# Patient Record
Sex: Male | Born: 1945
Health system: Southern US, Community
[De-identification: ages and names within clinical notes are randomized; demographics above are authoritative.]

## PROBLEM LIST (undated history)

## (undated) DIAGNOSIS — Z789 Other specified health status: Secondary | ICD-10-CM

## (undated) DIAGNOSIS — D649 Anemia, unspecified: Secondary | ICD-10-CM

## (undated) DIAGNOSIS — C801 Malignant (primary) neoplasm, unspecified: Secondary | ICD-10-CM

## (undated) DIAGNOSIS — Z433 Encounter for attention to colostomy: Secondary | ICD-10-CM

## (undated) DIAGNOSIS — K219 Gastro-esophageal reflux disease without esophagitis: Secondary | ICD-10-CM

## (undated) HISTORY — PX: GANGLION CYST EXCISION: SHX1691

---

## 2011-09-25 ENCOUNTER — Other Ambulatory Visit: Payer: Self-pay

## 2011-09-25 ENCOUNTER — Encounter (HOSPITAL_COMMUNITY): Admission: EM | Disposition: A | Discharge status: Home Health | Payer: Self-pay | Source: Home / Self Care

## 2011-09-25 ENCOUNTER — Encounter (HOSPITAL_COMMUNITY): Payer: Self-pay | Admitting: Anesthesiology

## 2011-09-25 ENCOUNTER — Other Ambulatory Visit (INDEPENDENT_AMBULATORY_CARE_PROVIDER_SITE_OTHER): Payer: Self-pay | Admitting: Surgery

## 2011-09-25 ENCOUNTER — Encounter: Payer: Self-pay | Admitting: *Deleted

## 2011-09-25 ENCOUNTER — Emergency Department (HOSPITAL_COMMUNITY): Payer: Medicare Other | Admitting: Anesthesiology

## 2011-09-25 ENCOUNTER — Inpatient Hospital Stay (HOSPITAL_COMMUNITY)
Admission: EM | Admit: 2011-09-25 | Discharge: 2011-10-02 | DRG: 330 | Disposition: A | Discharge status: Home Health | Payer: Medicare Other | Attending: Surgery | Admitting: Surgery

## 2011-09-25 DIAGNOSIS — C189 Malignant neoplasm of colon, unspecified: Secondary | ICD-10-CM

## 2011-09-25 DIAGNOSIS — C772 Secondary and unspecified malignant neoplasm of intra-abdominal lymph nodes: Secondary | ICD-10-CM | POA: Diagnosis present

## 2011-09-25 DIAGNOSIS — F17200 Nicotine dependence, unspecified, uncomplicated: Secondary | ICD-10-CM | POA: Diagnosis present

## 2011-09-25 DIAGNOSIS — C185 Malignant neoplasm of splenic flexure: Principal | ICD-10-CM | POA: Diagnosis present

## 2011-09-25 DIAGNOSIS — R030 Elevated blood-pressure reading, without diagnosis of hypertension: Secondary | ICD-10-CM | POA: Diagnosis not present

## 2011-09-25 DIAGNOSIS — K560 Paralytic ileus: Secondary | ICD-10-CM | POA: Diagnosis not present

## 2011-09-25 DIAGNOSIS — N179 Acute kidney failure, unspecified: Secondary | ICD-10-CM | POA: Diagnosis not present

## 2011-09-25 DIAGNOSIS — K219 Gastro-esophageal reflux disease without esophagitis: Secondary | ICD-10-CM | POA: Diagnosis present

## 2011-09-25 DIAGNOSIS — K5669 Other intestinal obstruction: Secondary | ICD-10-CM | POA: Diagnosis present

## 2011-09-25 DIAGNOSIS — K56609 Unspecified intestinal obstruction, unspecified as to partial versus complete obstruction: Secondary | ICD-10-CM

## 2011-09-25 HISTORY — PX: PARTIAL COLECTOMY: SHX5273

## 2011-09-25 HISTORY — PX: COLOSTOMY: SHX63

## 2011-09-25 HISTORY — DX: Gastro-esophageal reflux disease without esophagitis: K21.9

## 2011-09-25 LAB — COMPREHENSIVE METABOLIC PANEL
ALT: 9 U/L (ref 0–53)
BUN: 24 mg/dL — ABNORMAL HIGH (ref 6–23)
CO2: 26 mEq/L (ref 19–32)
Calcium: 9.2 mg/dL (ref 8.4–10.5)
Creatinine, Ser: 1.05 mg/dL (ref 0.50–1.35)
GFR calc Af Amer: 83 mL/min — ABNORMAL LOW (ref >90)
GFR calc non Af Amer: 71 mL/min — ABNORMAL LOW (ref >90)
Glucose, Bld: 109 mg/dL — ABNORMAL HIGH (ref 70–99)
Sodium: 134 mEq/L — ABNORMAL LOW (ref 135–145)

## 2011-09-25 LAB — DIFFERENTIAL
Eosinophils Relative: 0 % (ref 0–5)
Lymphocytes Relative: 29 % (ref 12–46)
Lymphs Abs: 1.5 10*3/uL (ref 0.7–4.0)
Monocytes Absolute: 1.1 10*3/uL — ABNORMAL HIGH (ref 0.1–1.0)
Monocytes Relative: 20 % — ABNORMAL HIGH (ref 3–12)

## 2011-09-25 LAB — CBC
HCT: 48.5 % (ref 39.0–52.0)
MCH: 29.3 pg (ref 26.0–34.0)
MCV: 84.5 fL (ref 78.0–100.0)
RBC: 5.74 MIL/uL (ref 4.22–5.81)
WBC: 5.2 10*3/uL (ref 4.0–10.5)

## 2011-09-25 SURGERY — COLECTOMY, PARTIAL
Anesthesia: General | Site: Abdomen | Wound class: Clean Contaminated

## 2011-09-25 MED ORDER — ONDANSETRON HCL 4 MG/2ML IJ SOLN
4.0000 mg | Freq: Four times a day (QID) | INTRAMUSCULAR | Status: DC | PRN
  Administered 2011-09-30: 4 mg via INTRAVENOUS
  Filled 2011-09-25: qty 2

## 2011-09-25 MED ORDER — SODIUM CHLORIDE 0.9 % IV SOLN
INTRAVENOUS | Status: DC
  Administered 2011-09-25: 03:00:00 via INTRAVENOUS

## 2011-09-25 MED ORDER — HEPARIN SODIUM (PORCINE) 5000 UNIT/ML IJ SOLN
5000.0000 [IU] | Freq: Three times a day (TID) | INTRAMUSCULAR | Status: DC
  Administered 2011-09-25 – 2011-10-02 (×20): 5000 [IU] via SUBCUTANEOUS
  Filled 2011-09-25 (×27): qty 1

## 2011-09-25 MED ORDER — PHENOL 1.4 % MT LIQD
1.0000 | OROMUCOSAL | Status: DC | PRN
  Administered 2011-09-25: 1 via OROMUCOSAL
  Filled 2011-09-25: qty 177

## 2011-09-25 MED ORDER — HYDROMORPHONE HCL PF 1 MG/ML IJ SOLN: 0.2500 mg | INTRAMUSCULAR | Status: DC | PRN

## 2011-09-25 MED ORDER — DIPHENHYDRAMINE HCL 12.5 MG/5ML PO ELIX: 12.5000 mg | ORAL_SOLUTION | Freq: Four times a day (QID) | ORAL | Status: DC | PRN

## 2011-09-25 MED ORDER — SODIUM CHLORIDE 0.9 % IJ SOLN: 9.0000 mL | INTRAMUSCULAR | Status: DC | PRN

## 2011-09-25 MED ORDER — DEXTROSE 5 % IV SOLN
1.0000 g | INTRAVENOUS | Status: DC | PRN
  Administered 2011-09-25: 2 g via INTRAVENOUS

## 2011-09-25 MED ORDER — ROCURONIUM BROMIDE 100 MG/10ML IV SOLN
INTRAVENOUS | Status: DC | PRN
  Administered 2011-09-25: 40 mg via INTRAVENOUS

## 2011-09-25 MED ORDER — MORPHINE SULFATE 2 MG/ML IJ SOLN: 1.0000 mg | INTRAMUSCULAR | Status: DC | PRN

## 2011-09-25 MED ORDER — KCL IN DEXTROSE-NACL 20-5-0.45 MEQ/L-%-% IV SOLN
INTRAVENOUS | Status: DC
  Administered 2011-09-25: 15:00:00 via INTRAVENOUS
  Administered 2011-09-26: 150 mL/h via INTRAVENOUS
  Administered 2011-09-26 – 2011-09-27 (×4): via INTRAVENOUS
  Administered 2011-09-27: 150 mL/h via INTRAVENOUS
  Administered 2011-09-28 – 2011-10-02 (×7): via INTRAVENOUS
  Filled 2011-09-25 (×24): qty 1000

## 2011-09-25 MED ORDER — SUCCINYLCHOLINE CHLORIDE 20 MG/ML IJ SOLN
INTRAMUSCULAR | Status: DC | PRN
  Administered 2011-09-25: 100 mg via INTRAVENOUS

## 2011-09-25 MED ORDER — VITAMINS A & D EX OINT
TOPICAL_OINTMENT | CUTANEOUS | Status: AC
  Administered 2011-09-25: 14:00:00
  Filled 2011-09-25: qty 5

## 2011-09-25 MED ORDER — NALOXONE HCL 0.4 MG/ML IJ SOLN: 0.4000 mg | INTRAMUSCULAR | Status: DC | PRN

## 2011-09-25 MED ORDER — MORPHINE SULFATE (PF) 1 MG/ML IV SOLN
INTRAVENOUS | Status: DC
  Administered 2011-09-25: 13:00:00 via INTRAVENOUS
  Filled 2011-09-25: qty 25

## 2011-09-25 MED ORDER — KCL IN DEXTROSE-NACL 20-5-0.45 MEQ/L-%-% IV SOLN
INTRAVENOUS | Status: AC
  Filled 2011-09-25: qty 1000

## 2011-09-25 MED ORDER — CEFOXITIN SODIUM-DEXTROSE 1-4 GM-% IV SOLR (PREMIX)
INTRAVENOUS | Status: AC
  Filled 2011-09-25: qty 100

## 2011-09-25 MED ORDER — NEOSTIGMINE METHYLSULFATE 1 MG/ML IJ SOLN
INTRAMUSCULAR | Status: DC | PRN
  Administered 2011-09-25: 5 mg via INTRAVENOUS

## 2011-09-25 MED ORDER — EPHEDRINE SULFATE 50 MG/ML IJ SOLN
INTRAMUSCULAR | Status: DC | PRN
  Administered 2011-09-25: 5 mg via INTRAVENOUS

## 2011-09-25 MED ORDER — HYDROMORPHONE HCL PF 1 MG/ML IJ SOLN
INTRAMUSCULAR | Status: DC | PRN
  Administered 2011-09-25: 0.5 mg via INTRAVENOUS

## 2011-09-25 MED ORDER — PROPOFOL 10 MG/ML IV EMUL
INTRAVENOUS | Status: DC | PRN
  Administered 2011-09-25: 180 mg via INTRAVENOUS

## 2011-09-25 MED ORDER — DIPHENHYDRAMINE HCL 50 MG/ML IJ SOLN: 12.5000 mg | Freq: Four times a day (QID) | INTRAMUSCULAR | Status: DC | PRN

## 2011-09-25 MED ORDER — LACTATED RINGERS IV SOLN
INTRAVENOUS | Status: DC | PRN
  Administered 2011-09-25: 10:00:00 via INTRAVENOUS

## 2011-09-25 MED ORDER — FENTANYL CITRATE 0.05 MG/ML IJ SOLN
25.0000 ug | Freq: Once | INTRAMUSCULAR | Status: AC
  Administered 2011-09-25: 25 ug via INTRAVENOUS
  Filled 2011-09-25: qty 2

## 2011-09-25 MED ORDER — KCL IN DEXTROSE-NACL 20-5-0.45 MEQ/L-%-% IV SOLN: INTRAVENOUS | Status: DC

## 2011-09-25 MED ORDER — HYDRALAZINE HCL 20 MG/ML IJ SOLN
INTRAMUSCULAR | Status: DC | PRN
  Administered 2011-09-25: 5 mg via INTRAVENOUS

## 2011-09-25 MED ORDER — LIDOCAINE HCL (CARDIAC) 20 MG/ML IV SOLN
INTRAVENOUS | Status: DC | PRN
  Administered 2011-09-25: 50 mg via INTRAVENOUS

## 2011-09-25 MED ORDER — ONDANSETRON HCL 4 MG/2ML IJ SOLN
INTRAMUSCULAR | Status: DC | PRN
  Administered 2011-09-25: 4 mg via INTRAVENOUS

## 2011-09-25 MED ORDER — MORPHINE SULFATE (PF) 1 MG/ML IV SOLN
INTRAVENOUS | Status: DC
  Administered 2011-09-25: 18 mg via INTRAVENOUS
  Administered 2011-09-25: 3 mg via INTRAVENOUS
  Administered 2011-09-26 (×2): 1.5 mg via INTRAVENOUS
  Administered 2011-09-26: via INTRAVENOUS
  Administered 2011-09-26: 21 mg via INTRAVENOUS
  Administered 2011-09-26: 14 mg via INTRAVENOUS
  Administered 2011-09-26: 4.5 mg via INTRAVENOUS
  Administered 2011-09-27 (×2): via INTRAVENOUS
  Administered 2011-09-28: 1.5 mg via INTRAVENOUS
  Administered 2011-09-28: 6 mg via INTRAVENOUS
  Administered 2011-09-28: 3 mg via INTRAVENOUS
  Administered 2011-09-29: 10:00:00 via INTRAVENOUS
  Filled 2011-09-25 (×6): qty 25

## 2011-09-25 MED ORDER — LIDOCAINE HCL 2 % EX GEL
Freq: Once | CUTANEOUS | Status: AC
  Administered 2011-09-25: 10 via TOPICAL
  Filled 2011-09-25: qty 10

## 2011-09-25 MED ORDER — PROMETHAZINE HCL 25 MG/ML IJ SOLN
6.2500 mg | INTRAMUSCULAR | Status: DC | PRN
  Filled 2011-09-25: qty 1

## 2011-09-25 MED ORDER — ONDANSETRON HCL 4 MG/2ML IJ SOLN: 4.0000 mg | Freq: Four times a day (QID) | INTRAMUSCULAR | Status: DC | PRN

## 2011-09-25 MED ORDER — SODIUM CHLORIDE 0.9 % IR SOLN
Status: DC | PRN
  Administered 2011-09-25: 2000 mL

## 2011-09-25 MED ORDER — CEFOXITIN SODIUM 2 G IV SOLR
2.0000 g | Freq: Once | INTRAVENOUS | Status: DC
  Filled 2011-09-25: qty 2

## 2011-09-25 MED ORDER — MIDAZOLAM HCL 5 MG/5ML IJ SOLN
INTRAMUSCULAR | Status: DC | PRN
  Administered 2011-09-25: 2 mg via INTRAVENOUS

## 2011-09-25 MED ORDER — DEXTROSE 5 % IV SOLN
1.0000 g | Freq: Four times a day (QID) | INTRAVENOUS | Status: AC
  Administered 2011-09-25 – 2011-09-26 (×3): 1 g via INTRAVENOUS
  Filled 2011-09-25 (×5): qty 1

## 2011-09-25 MED ORDER — GLYCOPYRROLATE 0.2 MG/ML IJ SOLN
INTRAMUSCULAR | Status: DC | PRN
  Administered 2011-09-25: .8 mg via INTRAVENOUS

## 2011-09-25 MED ORDER — FENTANYL CITRATE 0.05 MG/ML IJ SOLN
INTRAMUSCULAR | Status: DC | PRN
  Administered 2011-09-25 (×3): 50 ug via INTRAVENOUS
  Administered 2011-09-25: 100 ug via INTRAVENOUS
  Administered 2011-09-25 (×2): 50 ug via INTRAVENOUS

## 2011-09-25 MED ORDER — ONDANSETRON HCL 4 MG PO TABS: 4.0000 mg | ORAL_TABLET | Freq: Four times a day (QID) | ORAL | Status: DC | PRN

## 2011-09-25 MED ORDER — LABETALOL HCL 5 MG/ML IV SOLN
INTRAVENOUS | Status: DC | PRN
  Administered 2011-09-25 (×2): 5 mg via INTRAVENOUS

## 2011-09-25 MED ORDER — ONDANSETRON HCL 4 MG/2ML IJ SOLN
4.0000 mg | Freq: Once | INTRAMUSCULAR | Status: AC
  Administered 2011-09-25: 4 mg via INTRAVENOUS
  Filled 2011-09-25: qty 2

## 2011-09-25 SURGICAL SUPPLY — 56 items
APPLICATOR COTTON TIP 6IN STRL (MISCELLANEOUS) IMPLANT
BLADE EXTENDED COATED 6.5IN (ELECTRODE) ×3 IMPLANT
BLADE HEX COATED 2.75 (ELECTRODE) ×3 IMPLANT
BLADE SURG SZ10 CARB STEEL (BLADE) ×3 IMPLANT
CANISTER SUCTION 2500CC (MISCELLANEOUS) ×3 IMPLANT
CLIP TI LARGE 6 (CLIP) IMPLANT
CLOTH BEACON ORANGE TIMEOUT ST (SAFETY) ×3 IMPLANT
COVER MAYO STAND STRL (DRAPES) ×3 IMPLANT
COVER SURGICAL LIGHT HANDLE (MISCELLANEOUS) ×6 IMPLANT
DRAPE LAPAROSCOPIC ABDOMINAL (DRAPES) ×3 IMPLANT
DRAPE LG THREE QUARTER DISP (DRAPES) IMPLANT
DRAPE WARM FLUID 44X44 (DRAPE) ×3 IMPLANT
DRESSING TELFA 8X3 (GAUZE/BANDAGES/DRESSINGS) ×3 IMPLANT
DRSG PAD ABDOMINAL 8X10 ST (GAUZE/BANDAGES/DRESSINGS) ×3 IMPLANT
ELECT REM PT RETURN 9FT ADLT (ELECTROSURGICAL) ×3
ELECTRODE REM PT RTRN 9FT ADLT (ELECTROSURGICAL) ×2 IMPLANT
GLOVE BIOGEL PI IND STRL 7.0 (GLOVE) ×2 IMPLANT
GLOVE BIOGEL PI INDICATOR 7.0 (GLOVE) ×1
GLOVE EUDERMIC 7 POWDERFREE (GLOVE) ×6 IMPLANT
GLOVE SURG SIGNA 7.5 PF LTX (GLOVE) ×3 IMPLANT
GOWN STRL NON-REIN LRG LVL3 (GOWN DISPOSABLE) ×3 IMPLANT
GOWN STRL REIN XL XLG (GOWN DISPOSABLE) ×6 IMPLANT
HAND ACTIVATED (MISCELLANEOUS) IMPLANT
KIT BASIN OR (CUSTOM PROCEDURE TRAY) ×3 IMPLANT
LEGGING LITHOTOMY PAIR STRL (DRAPES) IMPLANT
LIGASURE IMPACT 36 18CM CVD LR (INSTRUMENTS) ×3 IMPLANT
NS IRRIG 1000ML POUR BTL (IV SOLUTION) ×6 IMPLANT
PACK GENERAL/GYN (CUSTOM PROCEDURE TRAY) ×3 IMPLANT
SPONGE GAUZE 4X4 12PLY (GAUZE/BANDAGES/DRESSINGS) ×3 IMPLANT
SPONGE LAP 18X18 X RAY DECT (DISPOSABLE) ×9 IMPLANT
STAPLER VISISTAT 35W (STAPLE) ×3 IMPLANT
SUCTION POOLE TIP (SUCTIONS) ×3 IMPLANT
SUT NOV 1 T60/GS (SUTURE) IMPLANT
SUT NOVA NAB DX-16 0-1 5-0 T12 (SUTURE) IMPLANT
SUT NOVA T20/GS 25 (SUTURE) IMPLANT
SUT PDS AB 1 CTX 36 (SUTURE) ×6 IMPLANT
SUT PDS AB 1 TP1 54 (SUTURE) IMPLANT
SUT PDS AB 1 TP1 96 (SUTURE) ×6 IMPLANT
SUT PROLENE 2 0 KS (SUTURE) IMPLANT
SUT PROLENE 2 0 SH DA (SUTURE) ×3 IMPLANT
SUT SILK 2 0 (SUTURE) ×1
SUT SILK 2 0 SH CR/8 (SUTURE) ×3 IMPLANT
SUT SILK 2 0SH CR/8 30 (SUTURE) IMPLANT
SUT SILK 2-0 18XBRD TIE 12 (SUTURE) ×2 IMPLANT
SUT SILK 2-0 30XBRD TIE 12 (SUTURE) IMPLANT
SUT SILK 3 0 (SUTURE) ×1
SUT SILK 3 0 SH CR/8 (SUTURE) ×3 IMPLANT
SUT SILK 3-0 18XBRD TIE 12 (SUTURE) ×2 IMPLANT
SUT VIC AB 3-0 SH 18 (SUTURE) ×6 IMPLANT
SUT VICRYL 2 0 18  UND BR (SUTURE)
SUT VICRYL 2 0 18 UND BR (SUTURE) IMPLANT
TAPE CLOTH SURG 4X10 WHT LF (GAUZE/BANDAGES/DRESSINGS) ×3 IMPLANT
TOWEL OR 17X26 10 PK STRL BLUE (TOWEL DISPOSABLE) ×6 IMPLANT
TRAY FOLEY CATH 14FRSI W/METER (CATHETERS) ×3 IMPLANT
WATER STERILE IRR 1500ML POUR (IV SOLUTION) IMPLANT
YANKAUER SUCT BULB TIP NO VENT (SUCTIONS) ×3 IMPLANT

## 2011-09-25 NOTE — Transfer of Care (Signed)
Immediate Anesthesia Transfer of Care Note  Patient: Aaron Roth  Procedure(s) Performed:  COLOSTOMY; PARTIAL COLECTOMY  Patient Location: PACU  Anesthesia Type: General  Level of Consciousness: awake, sedated and patient cooperative  Airway & Oxygen Therapy: Patient Spontanous Breathing and Patient connected to face mask oxygen  Post-op Assessment: Report given to PACU RN  Post vital signs: stable  Complications: No apparent anesthesia complications

## 2011-09-25 NOTE — Op Note (Signed)
NAME:  Aaron Roth, Aaron Roth NO.:  [id number]  MEDICAL RECORD NO.:  [id number]  LOCATION:  WOTF                         FACILITY:  [facility]  PHYSICIAN:  Sandria Bales. Ezzard Standing, M.D.  DATE OF BIRTH:  January 29, 1946  DATE OF PROCEDURE:  09/25/2011                               OPERATIVE REPORT   DATE OF SURGERY:  September 25, 2011.  PREOPERATIVE DIAGNOSIS:  Obstruction of colon at splenic flexure.  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  Final pathology pending.  PROCEDURE:  Colonic resection of left transverse colon and left colon with end left transverse colon colostomy and Hartmann's pouch (at prox sigmoid colon)  SURGEON:  Sandria Bales. Ezzard Standing, M.D.  FIRST ASSISTANT:  Ardeth Sportsman, MD  ANESTHESIA:  General endotracheal.  ESTIMATED BLOOD LOSS:  150 cc.  DRAINS:  Left in were none.  He does have a end transverse colon colostomy.  INDICATIONS FOR THE PROCEDURE:  Aaron Roth is a 65 year old African American male who is from [facility], sees Dr. Jeanmarie Plant as his primary medical doctor.  He has had a 4-day history of increasing abdominal distention and pain, went to [facility] last evening, he was evaluated there.  A CT scan suggested colonic obstruction.  They thought that he could not be managed there and he came to the [facility] Emergency Room where he was evaluated.  I have met the patient, his wife and his son and discussed with them my findings.  I think he has an obstructing mass (probable cancer) at the splenic flexure of his colon.  I discussed with him the indications and potential complications of surgery.  The potential complications of surgery include, but are not limited to, bleeding, infection, the possibility this is a cancer that would need further treatment, and long-term plans.  I discussed that he will probably need a colostomy.  OPERATIVE NOTE:  The patient was placed in a supine position, had a Foley  catheter in place, NG tube in place.  His abdomen was prepped with ChloraPrep and sterilely draped.    A time-out was held and a surgical checklist run.    Dr. Lestine Box was the supervising anesthesiologist and the patient was in room 1 at [facility].  I made a long midline abdominal incision with sharp dissection carried down to the abdominal cavity.  He had significant distention of his small bowel.  Abdominal exploration revealed right and left lobes of the liver unremarkable.  There was no evidence of metastatic disease.  His gallbladder was distended, but otherwise unremarkable.  He had an NG tube in his stomach.  The stomach was otherwise unremarkable.  I ran the small bowel entirely.  There was no mass or lesion.  The colon, particularly his cecum was significantly distended from this obstruction.  He had a palpable mass near the splenic flexure of his colon.  There was no evidence of any gross tumor in any other part of his body.  I spent about 30 minutes milking contents from the small bowel back to the NG tube.  We were able to milk about 1300 cc of succus entericus into the stomach and thus  into the NGT.  Then, I turned my attention to the tumor.  I mobilized the left colon going along the lateral peritoneal folds, moving this to the midline.  I identified the spleen, took down the splenic flexure, got into the lesser sac, and mobilized the transverse colon down from dividing the gastrocolic ligament.  I used primarily the LigaSure to do this division.  I did ligate some vessels with 2-0 silk suture.  I got the left colon mobilized, the transverse colon mobilized, I then divided the distal left colon at the junction of the sigmoid colon with a 75- mm Ethicon GIA stapler.  I marked the sigmoid colon stump with two 2-0 Prolene sutures.  I then divided the proximal colon with a 75-mm GIA stapler and resected the colon.  I tried to get the colonic mesentery down to the base  of the mesentery, identified the pancreas, which I tried to spare, and then took off the left colon.  I put a long suture in the proximal segment,  This was sent to pathology.  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  I then irrigated the abdomen with 2 L of saline.  As I mentioned before, I had already marked his distal colonic stump.  I made about a silver dollar size excision of skin in the left upper quadrant.  This is where I brought out his colostomy.  I brought the transverse colon, which came over easily to the left upper quadrant.  I used 2-0 Vicryl sutures to tack this to the anterior abdominal wall and then brought the colon out after I closed the abdomen.  I then tried to lay the omentum underneath the midline incision.  I closed the abdomen with two running double- stranded PDS sutures with interrupted #1 PDS sutures about every fifth row.  I irrigated the wound with saline.  I closed the skin with staples, placed Telfa wicks in the wound.  I then matured the colostomy.  I had about a 2-1/4 inch ostomy that protruded.  I was able to put my finger easily through the colostomy into the peritoneal cavity and then placed a colostomy bag.  The wound was then sterilely dressed.  The patient tolerated the procedure well.  I think he is a little dry coming into the operation.  I gave him about 3 L of fluid during the surgery.  We will plan to put him in the step-down ICU postop for observation.   Sandria Bales. Ezzard Standing, M.D., FACS   DHN/MEDQ  D:  09/25/2011  T:  09/25/2011  Job:  [id number]  cc:   Jeanmarie Plant, MD Fax: [phone]

## 2011-09-25 NOTE — ED Notes (Signed)
Ezzard Standing, MD at bedside.

## 2011-09-25 NOTE — ED Notes (Signed)
Pt transported to OR

## 2011-09-25 NOTE — ED Notes (Signed)
Pt sent over by PV from [facility] with paperwork indicating that he would be transferred. Pt with IV in right AC started PTA. Pt went to [facility] due to abd pain since Sunday, sent here due to possible intestinal blockage and a mass in his abdomen.

## 2011-09-25 NOTE — Anesthesia Preprocedure Evaluation (Addendum)
Anesthesia Evaluation  Patient identified by MRN, date of birth, ID band Patient awake    Reviewed: Allergy & Precautions, H&P , NPO status , Patient's Chart, lab work & pertinent test results, reviewed documented beta blocker date and time   Airway Mallampati: II TM Distance: >3 FB Neck ROM: Full    Dental  (+) Partial Upper   Pulmonary neg pulmonary ROS,    + decreased breath sounds      Cardiovascular neg cardio ROS Regular Normal Denies cardiac symptoms   Neuro/Psych Negative Neurological ROS  Negative Psych ROS   GI/Hepatic Neg liver ROS, SBO   Endo/Other  Negative Endocrine ROS  Renal/GU negative Renal ROS  Genitourinary negative   Musculoskeletal negative musculoskeletal ROS (+)   Abdominal   Peds negative pediatric ROS (+)  Hematology negative hematology ROS (+)   Anesthesia Other Findings   Reproductive/Obstetrics negative OB ROS                          Anesthesia Physical Anesthesia Plan  ASA: III and Emergent  Anesthesia Plan: General   Post-op Pain Management:    Induction: Intravenous, Rapid sequence and Cricoid pressure planned  Airway Management Planned: Oral ETT  Additional Equipment:   Intra-op Plan:   Post-operative Plan: Extubation in OR  Informed Consent: I have reviewed the patients History and Physical, chart, labs and discussed the procedure including the risks, benefits and alternatives for the proposed anesthesia with the patient or authorized representative who has indicated his/her understanding and acceptance.   Dental advisory given  Plan Discussed with: CRNA and Surgeon  Anesthesia Plan Comments:        Anesthesia Quick Evaluation

## 2011-09-25 NOTE — Brief Op Note (Signed)
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  Path pending.  PROCEDURE:   Procedure(s): COLOSTOMY  (resection left transverse and left colon) PARTIAL COLECTOMY  SURGEON:   Ovidio Kin, M.D.  ASSISTANTMichaell Cowing  ANESTHESIA:   general  EBL:  150  ml  Total I/O In: 3150 [I.V.:3050; IV Piggyback:100] Out: 1575 [Urine:75; Other:1400; Blood:100]  BLOOD ADMINISTERED: none  DRAINS: none   LOCAL MEDICATIONS USED:   None  SPECIMEN:   Left transverse and left colon (suture prox)  COUNTS CORRECT:  YES  INDICATIONS FOR PROCEDURE:  Aaron Roth is a 65 y.o. (DOB: 04/26/1946) AA male whose primary care physician is Dr. [name], [facility] , and comes for surgery for colonic obstruction.   The indications and risks of the surgery were explained to the patient.  The risks include, but are not limited to, infection, bleeding, and nerve injury.  I also talked to the patient about probable cancer, probable colostomy, and recovery.  Note dictated to:   #[id number]  09/25/2011  DN

## 2011-09-25 NOTE — ED Notes (Signed)
NG tube insertion attempted x 5. Patient unable to tolerate tube being inserted into nasal vault. Patient states that NG tube attempt at previous hospital was "traumatic." Patient given a rest period. Will attempt re-insertion later.

## 2011-09-25 NOTE — Anesthesia Postprocedure Evaluation (Signed)
  Anesthesia Post-op Note  Patient: Aaron Roth  Procedure(s) Performed:  COLOSTOMY; PARTIAL COLECTOMY  Patient Location: PACU  Anesthesia Type: General  Level of Consciousness: oriented and sedated  Airway and Oxygen Therapy: Patient Spontanous Breathing and Patient connected to nasal cannula oxygen  Post-op Pain: mild  Post-op Assessment: Post-op Vital signs reviewed, Patient's Cardiovascular Status Stable, Respiratory Function Stable and Patent Airway  Post-op Vital Signs: stable  Complications: No apparent anesthesia complications

## 2011-09-25 NOTE — Preoperative (Signed)
Beta Blockers   Reason not to administer Beta Blockers:Not Applicable 

## 2011-09-25 NOTE — H&P (Addendum)
[facility]  Aaron Kin, Aaron Roth,  Aaron Roth [address]  [address]  [place], [place]    [address] Phone:  [phone] FAX:  [phone]  Re:   Aaron Roth DOB:   09-02-1946 MRN:   [id number]  ASSESSMENT AND PLAN: 1.  ? Splenic flexure, colonic obstruction  Probable malignancy.  I discussed surgery with patient and family.  The planned surgery would be colonic resection with ostomy.  The risks of surgery included, but are not limited to, bleeding, infection, spread of cancer, and nerve injury. Further therapy would depend on final pathology.  To OR today.  2.  Smokes  Chief Complaint  Patient presents with  . Abdominal Pain   REFERRING PHYSICIAN:   Dr. Dierdre Roth,  [facility]  HISTORY OF PRESENT ILLNESS: Aaron Roth is a 65 y.o. (DOB: 16-Dec-1945)  AA male whose primary care physician is Aaron Bailiff, Aaron Roth [facility])  and comes to the [facility] with abdominal pain.  He was originally seen at [facility] evaluated there, and then traveled to [facility].  The patients symptoms started last Sunday, 09/20/2011, when he had increasing distention and abdominal pain.  Pt denies any prior GI history. He developed abdominal distention, N&V, and has had almost no BMs. He thinks he had a colonoscopy in Sept 2011, but I think he is mistaken.  He went the [facility] ER.  A CT scan read by Dr. Gillie Roth shows a bowel obstruction at the splenic flexure.  He was told he needs to go to larger hospital and choose [facility].  He has no history of stomach disease.  No history of liver disease.  No history of gall bladder disease.  No history of pancreas disease.  No history of colon disease.   History reviewed. No pertinent past medical history.   History reviewed. No pertinent past surgical history.    Current Facility-Administered Medications  Medication Dose Route Frequency Provider Last Rate Last Dose  . 0.9 %  sodium chloride infusion   Intravenous Continuous Aaron Nielsen, Aaron Roth 125 mL/hr at 09/25/11 0308    . fentaNYL (SUBLIMAZE) injection 25 mcg  25 mcg Intravenous Once Aaron Nielsen, Aaron Roth   25 mcg at 09/25/11 0307  . lidocaine (XYLOCAINE) 2 % jelly   Topical Once Aaron Nielsen, Aaron Roth   10 application at 09/25/11 0320  . ondansetron (ZOFRAN) injection 4 mg  4 mg Intravenous Once Aaron Nielsen, Aaron Roth   4 mg at 09/25/11 0307  . phenol (CHLORASEPTIC) mouth spray 1 spray  1 spray Mouth/Throat PRN Aaron Nielsen, Aaron Roth       No current outpatient prescriptions on file.     No Known Allergies  REVIEW OF SYSTEMS: Skin:  No history of rash.  No history of abnormal moles. Infection:  No history of hepatitis or HIV.  No history of MRSA. Neurologic:  No history of stroke.  No history of seizure.  No history of headaches. Cardiac:  No history of hypertension. No history of heart disease.  No history of prior cardiac catheterization.  No history of seeing a cardiologist. Pulmonary:  Smoke cigarettes x 1/2 ppd.  No asthma or bronchitis.  No OSA/CPAP.  Endocrine:  No diabetes. No thyroid disease. Gastrointestinal:  See HPI. Urologic:  No history of kidney stones.  No history of bladder infections. Musculoskeletal:  No history of joint or back disease. Hematologic:  No bleeding disorder.  No history of anemia.  Not anticoagulated. Psycho-social:  The patient is oriented.  The patient has no obvious psychologic or social impairment to understanding our conversation and plan.  SOCIAL and FAMILY HISTORY: Retired from [employer]. Wife and only son, Aaron Roth, are at bedside. They came to [facility] because they told them they needed a bigger hospital at [facility].  PHYSICAL EXAM: BP 189/102  Pulse 92  Temp(Src) 98.9 F (37.2 C) (Oral)  Resp 20  SpO2 99%  General: Older WN AA who is alert. HEENT: Normal. Pupils equal. Good dentition.  NGT in right nares. Neck: Supple. No mass.  No thyroid mass.  Carotid pulse okay with no bruit. Lymph Nodes:  No  supraclavicular or cervical nodes. Lungs: Clear to auscultation and symmetric breath sounds. Heart:  RRR. No murmur or rub. Abdomen: Soft.  No hernia. Distended with decrease bowels sounds.  No real tenderness. Rectal: Not done. Extremities:  Good strength and ROM  in upper and lower extremities. Neurologic:  Grossly intact to motor and sensory function. Psychiatric: Has normal mood and affect. Behavior is normal.   DATA REVIEWED: CT scan from [facility].  Labs.  I showed these to his family. Hgb - 16.8,  K+-3.3.  Aaron Kin, Aaron Roth, Aaron Roth Phone:  [phone]

## 2011-09-25 NOTE — ED Provider Notes (Signed)
History     CSN: [id number] Arrival date & time: 09/25/2011  1:24 AM   First MD Initiated Contact with Patient 09/25/11 0205      Chief Complaint  Patient presents with  . Abdominal Pain    (Consider location/radiation/quality/duration/timing/severity/associated sxs/prior treatment) Patient is a 65 y.o. male presenting with abdominal pain. The history is provided by the patient.  Abdominal Pain The primary symptoms of the illness include abdominal pain. The primary symptoms of the illness do not include fever, shortness of breath or dysuria. The current episode started more than 2 days ago. The onset of the illness was gradual. The problem has been gradually worsening.  Associated with: Nothing. The patient has not had a change in bowel habit. Risk factors for an acute abdominal problem include being elderly. Additional symptoms associated with the illness include constipation and back pain. Symptoms associated with the illness do not include chills, diaphoresis, heartburn, urgency, hematuria or frequency. Significant associated medical issues do not include inflammatory bowel disease, diabetes or diverticulitis.   Patient presented to [facility] for abdominal pain for the last 5 days. He has associated nausea and vomiting with anything he tries tear drink. He also has noted some increased swelling of his abdomen. Pain is all over. Pain is described as discomfort and sometime sharp. Patient was evaluated at the outside hospital a CAT scan which reportedly shows bowel obstruction. Patient is from [place] area and wanted to be evaluated here, so he was sent from the emergency department by private vehicle to the emergency room here. Pain is moderate in severity. His not radiating from abdomen. No alleviating factors. No hematochezia  History reviewed. No pertinent past medical history.  History reviewed. No pertinent past surgical history.  History reviewed. No pertinent family  history.  History  Substance Use Topics  . Smoking status: Current Everyday Smoker  . Smokeless tobacco: Not on file  . Alcohol Use: No      Review of Systems  Constitutional: Negative for fever, chills and diaphoresis.  HENT: Negative for neck pain and neck stiffness.   Eyes: Negative for pain.  Respiratory: Negative for shortness of breath.   Cardiovascular: Negative for chest pain.  Gastrointestinal: Positive for abdominal pain and constipation. Negative for heartburn and blood in stool.  Genitourinary: Negative for dysuria, urgency, frequency and hematuria.  Musculoskeletal: Positive for back pain.  Skin: Negative for rash.  Neurological: Negative for headaches.  All other systems reviewed and are negative.    Allergies  Review of patient's allergies indicates no known allergies.  Home Medications  No current outpatient prescriptions on file.  BP 189/102  Pulse 92  Temp(Src) 98.9 F (37.2 C) (Oral)  Resp 20  SpO2 99%  Physical Exam  Constitutional: He is oriented to person, place, and time. He appears well-developed and well-nourished.  HENT:  Head: Normocephalic and atraumatic.  Eyes: Conjunctivae and EOM are normal. Pupils are equal, round, and reactive to light.  Neck: Trachea normal. Neck supple. No thyromegaly present.  Cardiovascular: Normal rate, regular rhythm, S1 normal, S2 normal and normal pulses.     No systolic murmur is present   No diastolic murmur is present  Pulses:      Radial pulses are 2+ on the right side, and 2+ on the left side.  Pulmonary/Chest: Effort normal and breath sounds normal. He has no wheezes. He has no rhonchi. He has no rales. He exhibits no tenderness.  Abdominal: Soft. Normal appearance and bowel sounds are normal. There  is no CVA tenderness and negative Murphy's sign.       Mild diffuse tenderness and mild distention. No peritonitis decreased bowel sounds  Musculoskeletal:       BLE:s Calves nontender, no cords or  erythema, negative Homans sign  Neurological: He is alert and oriented to person, place, and time. He has normal strength. No cranial nerve deficit or sensory deficit. GCS eye subscore is 4. GCS verbal subscore is 5. GCS motor subscore is 6.  Skin: Skin is warm and dry. No rash noted. He is not diaphoretic.  Psychiatric: His speech is normal.       Cooperative and appropriate    ED Course  Procedures (including critical care time)  Records reviewed from outside hospital. CT scan report reads bowel obstruction pattern with transition point in the region of the splenic flexure. This is concerning for an underlying colonic neoplasm. There is a small amount of free intraperitoneal fluid. No free intraperitoneal air. Emergency department report reviewed and includes labs reported WBC 5500, hemoglobin 16.4, hematocrit 51.2, sodium 139, potassium 3.2, BUN 25, creatinine 1.1, LFTs and amylase normal.  Case discussed with Dr. Hulan Saas who is aware of PT being transferred here, although he states he did not accept this patient in transfer, and he recommends that [facility] see PT here. IV fluid hydration and general surgery consult obtained.  Case discussed with Dr. Ezzard Standing on call for general surgery at 2:30 AM and he will see patient in the emergency department. Patient agreeable to NG tube. IV fentanyl pain control and Zofran as needed   MDM   Small bowel obstruction from outside hospital. Pain control. Labs. CT performed at outside facility.        Sunnie Nielsen, MD 09/25/11 [phone]

## 2011-09-26 ENCOUNTER — Encounter (HOSPITAL_COMMUNITY): Payer: Self-pay

## 2011-09-26 DIAGNOSIS — N19 Unspecified kidney failure: Secondary | ICD-10-CM

## 2011-09-26 LAB — DIFFERENTIAL
Basophils Absolute: 0 10*3/uL (ref 0.0–0.1)
Eosinophils Absolute: 0.1 10*3/uL (ref 0.0–0.7)
Lymphs Abs: 1.3 10*3/uL (ref 0.7–4.0)
Neutro Abs: 2.8 10*3/uL (ref 1.7–7.7)

## 2011-09-26 LAB — BASIC METABOLIC PANEL
BUN: 18 mg/dL (ref 6–23)
Creatinine, Ser: 1.31 mg/dL (ref 0.50–1.35)
GFR calc Af Amer: 63 mL/min — ABNORMAL LOW (ref >90)
GFR calc non Af Amer: 55 mL/min — ABNORMAL LOW (ref >90)

## 2011-09-26 LAB — CBC
HCT: 45.1 % (ref 39.0–52.0)
MCHC: 33.7 g/dL (ref 30.0–36.0)
MCV: 85.6 fL (ref 78.0–100.0)
RDW: 14.8 % (ref 11.5–15.5)

## 2011-09-26 NOTE — Plan of Care (Signed)
Problem: Phase I Progression Outcomes Goal: Pain controlled with appropriate interventions Outcome: Progressing Pt using PCA to manage pain. Education was completed yesterday with patient and family by this RN Goal: Voiding-avoid urinary catheter unless indicated Outcome: Completed/Met Date Met:  09/26/11 Urinary catheter was removed first thing this morning and patient has since voided

## 2011-09-26 NOTE — Progress Notes (Signed)
POD# 1  Assessment/Plan:   1.  Resection of left transverse colon/left colon and end colostomy Secondary to obstructing colon ca To transfer to floor   2.  Smokes  3.  To remove foley  4.  VTE - on SQ Hep    LOS: 1 day   Subjective:  NGT is bothering him.  I offered to take it out, but he would rather leave it, if there is any chance of replacing it  Objective:   Filed Vitals:   09/26/11 0400  BP: 144/80  Pulse:   Temp: 98.7 F (37.1 C)  Resp: 18     Intake/Output from previous day:  11/23 0701 - 11/24 0700 In: 6150 [I.V.:5900; IV Piggyback:250] Out: 2290 [Urine:680; Emesis/NG output:110; Blood:100]  Intake/Output this shift:  Total I/O In: 1750 [I.V.:1650; IV Piggyback:100] Out: 420 [Urine:320; Emesis/NG output:100]   Physical Exam:   General: AA who is alert and sitting in chair.   HEENT: Normal. Pupils equal. Good dentition. .   Lungs: Clear   Abdomen: Quiet, dressing intact, Ostomy with edema, minimal output   Wound: Covered   Neurologic:  Grossly intact to motor and sensory function.   Psychiatric: Has normal mood and affect. Behavior is normal   Lab Results:    Basename 09/26/11 0620 09/25/11 0219  WBC 4.9 5.2  HGB 15.2 16.8  HCT 45.1 48.5  PLT 372 427*    BMET   Basename 09/25/11 0219  NA 134*  K 3.3*  CL 96  CO2 26  GLUCOSE 109*  BUN 24*  CREATININE 1.05  CALCIUM 9.2    PT/INR  No results found for this basename: LABPROT:2,INR:2 in the last 72 hours  ABG  No results found for this basename: PHART:2,PCO2:2,PO2:2,HCO3:2 in the last 72 hours   Studies/Results:  No results found.   Anti-infectives:   Anti-infectives     Start     Dose/Rate Route Frequency Ordered Stop   09/25/11 1700   cefOXitin (MEFOXIN) 1 g in dextrose 5 % 50 mL IVPB        1 g 100 mL/hr over 30 Minutes Intravenous Every 6 hours 09/25/11 1249 09/26/11 0630   09/25/11 1045   cefOXitin (MEFOXIN) 2 g in dextrose 5 % 50 mL IVPB  Status:  Discontinued        2  g 100 mL/hr over 30 Minutes Intravenous  Once 09/25/11 1033 09/25/11 1517           Aaron Kin, MD, FACS Pager: [phone],   [facility] Office: [phone] 09/26/2011

## 2011-09-27 DIAGNOSIS — K219 Gastro-esophageal reflux disease without esophagitis: Secondary | ICD-10-CM | POA: Diagnosis present

## 2011-09-27 LAB — BASIC METABOLIC PANEL
BUN: 10 mg/dL (ref 6–23)
Creatinine, Ser: 0.96 mg/dL (ref 0.50–1.35)
GFR calc Af Amer: >90 mL/min (ref >90)
GFR calc non Af Amer: 84 mL/min — ABNORMAL LOW (ref >90)
Potassium: 3.6 mEq/L (ref 3.5–5.1)

## 2011-09-27 LAB — CBC
MCHC: 33.2 g/dL (ref 30.0–36.0)
RDW: 14.4 % (ref 11.5–15.5)
WBC: 4.9 10*3/uL (ref 4.0–10.5)

## 2011-09-27 MED ORDER — PHENOL 1.4 % MT LIQD: 2.0000 | OROMUCOSAL | Status: DC | PRN

## 2011-09-27 MED ORDER — DIPHENHYDRAMINE HCL 25 MG PO CAPS: 25.0000 mg | ORAL_CAPSULE | Freq: Four times a day (QID) | ORAL | Status: DC | PRN

## 2011-09-27 MED ORDER — FLORA-Q PO CAPS
1.0000 | ORAL_CAPSULE | Freq: Every day | ORAL | Status: DC
  Administered 2011-09-27 – 2011-09-28 (×2): 1 via ORAL
  Filled 2011-09-27 (×2): qty 1

## 2011-09-27 MED ORDER — DIPHENHYDRAMINE HCL 50 MG/ML IJ SOLN: 12.5000 mg | Freq: Four times a day (QID) | INTRAMUSCULAR | Status: DC | PRN

## 2011-09-27 MED ORDER — LIP MEDEX EX OINT
1.0000 "application " | TOPICAL_OINTMENT | Freq: Two times a day (BID) | CUTANEOUS | Status: DC
  Administered 2011-09-27 – 2011-10-01 (×8): 1 via TOPICAL
  Filled 2011-09-27 (×2): qty 7

## 2011-09-27 MED ORDER — LACTATED RINGERS IV BOLUS (SEPSIS)
1000.0000 mL | Freq: Once | INTRAVENOUS | Status: AC
  Administered 2011-09-27: 1000 mL via INTRAVENOUS

## 2011-09-27 MED ORDER — MENTHOL 3 MG MT LOZG: 1.0000 | LOZENGE | OROMUCOSAL | Status: DC | PRN

## 2011-09-27 MED ORDER — MAGIC MOUTHWASH
15.0000 mL | Freq: Four times a day (QID) | ORAL | Status: DC
  Administered 2011-09-27 – 2011-10-02 (×14): 15 mL via ORAL
  Filled 2011-09-27 (×28): qty 15

## 2011-09-27 MED ORDER — LACTATED RINGERS IV BOLUS (SEPSIS): 1000.0000 mL | Freq: Four times a day (QID) | INTRAVENOUS | Status: AC | PRN

## 2011-09-27 MED ORDER — PANTOPRAZOLE SODIUM 40 MG IV SOLR
40.0000 mg | INTRAVENOUS | Status: DC
  Administered 2011-09-27 – 2011-10-02 (×6): 40 mg via INTRAVENOUS
  Filled 2011-09-27 (×6): qty 40

## 2011-09-27 MED ORDER — MAGIC MOUTHWASH: 15.0000 mL | Freq: Four times a day (QID) | ORAL | Status: DC | PRN

## 2011-09-27 MED ORDER — ACETAMINOPHEN 650 MG RE SUPP: 650.0000 mg | Freq: Four times a day (QID) | RECTAL | Status: DC | PRN

## 2011-09-27 MED ORDER — MORPHINE SULFATE 2 MG/ML IJ SOLN: 2.0000 mg | INTRAMUSCULAR | Status: DC | PRN

## 2011-09-27 NOTE — Progress Notes (Signed)
PCP: No primary provider on file.  Outpatient Care Team: Patient has no care team.  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)  Subjective:  No major events C/o sore throat PCA helping w pain control Awaiting floor bed  Objective:  Vital signs:  Temp:  [98.5 F (36.9 C)-99.8 F (37.7 C)] 99.1 F (37.3 C) (11/25 0400) Pulse Rate:  [70-101] 70  (11/24 2000) Resp:  [15-22] 15  (11/24 2000) BP: (138-163)/(62-87) 144/65 mmHg (11/24 2000) SpO2:  [93 %-100 %] 100 % (11/24 2000) FiO2 (%):  [21 %] 21 % (11/24 1958) Weight:  [206 lb 12.7 oz (93.8 kg)] 206 lb 12.7 oz (93.8 kg) (11/25 0000)    Intake/Output    from previous day: 11/24 0701 - 11/25 0700 In: 1800 [I.V.:1800] Out: 536 [Urine:461; Emesis/NG output:75]  this shift:    Flatus: scant BM: no  Physical Exam:  General: Pt awake/alert/oriented x4 in no acute distress Eyes: PERRL, normal EOM.  Sclera clear.  No icterus Neuro: CN II-XII intact w/o focal sensory/motor deficits. Lymph: No head/neck/groin lymphadenopathy Psych:  No delerium/psychosis/paranoia HENT: Normocephalic, Mucus membranes moist.  No thrush Neck: Supple, No tracheal deviation Chest: Clear.  No chest wall pain w good excursion CV:  Pulses intact.  Regular rhythm Abdomen: Soft, Obese Tender at incision.  Mod distended.  No incarcerated hernias. Ext:  SCDs BLE.  No mjr edema.  No cyanosis Skin: No petechiae / purpurae  Results:   Labs: Results for orders placed during the hospital encounter of 09/25/11 (from the past 48 hour(s))  CEA     Status:  Normal   Collection Time   09/25/11  5:45 PM      Component Value Range Comment   CEA 1.3  0.0 - 5.0 (ng/mL)   CBC     Status: Normal   Collection Time   09/26/11  6:20 AM      Component Value Range Comment   WBC 4.9  4.0 - 10.5 (K/uL)    RBC 5.27  4.22 - 5.81 (MIL/uL)    Hemoglobin 15.2  13.0 - 17.0 (g/dL)    HCT 16.1  09.6 - 04.5 (%)    MCV 85.6  78.0 - 100.0 (fL)    MCH 28.8  26.0 - 34.0 (pg)    MCHC 33.7  30.0 - 36.0 (g/dL)    RDW 40.9  81.1 - 91.4 (%)    Platelets 372  150 - 400 (K/uL)   DIFFERENTIAL     Status: Abnormal   Collection Time   09/26/11  6:20 AM      Component Value Range Comment   Neutrophils Relative 57  43 - 77 (%)    Lymphocytes Relative 27  12 - 46 (%)    Monocytes Relative 15 (*) 3 - 12 (%)    Eosinophils Relative 1  0 - 5 (%)    Basophils Relative 0  0 - 1 (%)    Neutro Abs 2.8  1.7 - 7.7 (K/uL)    Lymphs Abs 1.3  0.7 - 4.0 (K/uL)    Monocytes Absolute 0.7  0.1 - 1.0 (K/uL)    Eosinophils  Absolute 0.1  0.0 - 0.7 (K/uL)    Basophils Absolute 0.0  0.0 - 0.1 (K/uL)    WBC Morphology INCREASED BANDS (>20% BANDS)   DOHLE BODIES  BASIC METABOLIC PANEL     Status: Abnormal   Collection Time   09/26/11  6:20 AM      Component Value Range Comment   Sodium 134 (*) 135 - 145 (mEq/L)    Potassium 4.0  3.5 - 5.1 (mEq/L)    Chloride 97  96 - 112 (mEq/L)    CO2 28  19 - 32 (mEq/L)    Glucose, Bld 149 (*) 70 - 99 (mg/dL)    BUN 18  6 - 23 (mg/dL)    Creatinine, Ser 1.61  0.50 - 1.35 (mg/dL)    Calcium 8.0 (*) 8.4 - 10.5 (mg/dL)    GFR calc non Af Amer 55 (*) >90 (mL/min)    GFR calc Af Amer 63 (*) >90 (mL/min)   CEA     Status: Normal   Collection Time   09/26/11  6:20 AM      Component Value Range Comment   CEA 1.2  0.0 - 5.0 (ng/mL)     Imaging / Studies: @[id number] @  Antibiotics: Anti-infectives     Start     Dose/Rate Route Frequency Ordered Stop   09/25/11 1700   cefOXitin (MEFOXIN) 1 g in dextrose 5 % 50 mL IVPB        1 g 100 mL/hr over  30 Minutes Intravenous Every 6 hours 09/25/11 1249 09/26/11 0630   09/25/11 1045   cefOXitin (MEFOXIN) 2 g in dextrose 5 % 50 mL IVPB  Status:  Discontinued        2 g 100 mL/hr over 30 Minutes Intravenous  Once 09/25/11 1033 09/25/11 1517          Medications / Allergies: per chart  Assessment / Plan: Aaron Roth  65 y.o. 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 Gastrointestinal and Minimally Invasive Surgery [facility] [address] [place], [place] [address] [phone] Main / Paging [phone] Voice Mail   09/27/2011

## 2011-09-28 LAB — BASIC METABOLIC PANEL
CO2: 26 mEq/L (ref 19–32)
Chloride: 97 mEq/L (ref 96–112)
Potassium: 3.4 mEq/L — ABNORMAL LOW (ref 3.5–5.1)
Sodium: 131 mEq/L — ABNORMAL LOW (ref 135–145)

## 2011-09-28 LAB — CBC
Platelets: 345 10*3/uL (ref 150–400)
RBC: 4.54 MIL/uL (ref 4.22–5.81)
WBC: 6.3 10*3/uL (ref 4.0–10.5)

## 2011-09-28 MED ORDER — POTASSIUM CHLORIDE 10 MEQ/100ML IV SOLN
10.0000 meq | INTRAVENOUS | Status: DC
  Administered 2011-09-28: 10 meq via INTRAVENOUS
  Filled 2011-09-28 (×3): qty 100

## 2011-09-28 MED ORDER — POTASSIUM CHLORIDE 10 MEQ/100ML IV SOLN
10.0000 meq | INTRAVENOUS | Status: AC
  Filled 2011-09-28 (×3): qty 100

## 2011-09-28 NOTE — Progress Notes (Signed)
Pt examined, agree with above  Mariella Saa MD, FACS  09/28/2011, 2:51 PM

## 2011-09-28 NOTE — Progress Notes (Signed)
Patient ID: Aaron Roth, male   DOB: 02-11-1946, 65 y.o.   MRN: [id number] 3 Days Post-Op  Subjective: Pt feels ok.  Thinks he has heard some flatus.  ambulating  Objective: Vital signs in last 24 hours: Temp:  [99.9 F (37.7 C)-100.3 F (37.9 C)] 99.9 F (37.7 C) (11/26 0545) Pulse Rate:  [93-100] 93  (11/26 0545) Resp:  [18-20] 20  (11/26 0545) BP: (154-185)/(78-84) 154/84 mmHg (11/26 0545) SpO2:  [92 %-99 %] 95 % (11/26 0545)    Intake/Output from previous day: 11/25 0701 - 11/26 0700 In: 4284.6 [I.V.:3284.6; IV Piggyback:1000] Out: 1450 [Urine:925; Emesis/NG output:525] Intake/Output this shift:    PE: Abd: soft, -BS, appropriately tender, incision clean with staples and wicks present.  Ostomy with serosang output and minimal air.  Stoma edematous but pink and viable.  Lab Results:   [facility] 09/28/11 0510 09/27/11 0815  WBC 6.3 4.9  HGB 12.9* 12.8*  HCT 38.6* 38.5*  PLT 345 330   BMET  Basename 09/28/11 0510 09/27/11 0815  NA 131* 132*  K 3.4* 3.6  CL 97 98  CO2 26 28  GLUCOSE 104* 108*  BUN 7 10  CREATININE 0.82 0.96  CALCIUM 8.0* 7.9*   PT/INR No results found for this basename: LABPROT:2,INR:2 in the last 72 hours   Studies/Results: No results found.  Anti-infectives: Anti-infectives     Start     Dose/Rate Route Frequency Ordered Stop   09/25/11 1700   cefOXitin (MEFOXIN) 1 g in dextrose 5 % 50 mL IVPB        1 g 100 mL/hr over 30 Minutes Intravenous Every 6 hours 09/25/11 1249 09/26/11 0630   09/25/11 1045   cefOXitin (MEFOXIN) 2 g in dextrose 5 % 50 mL IVPB  Status:  Discontinued        2 g 100 mL/hr over 30 Minutes Intravenous  Once 09/25/11 1033 09/25/11 1517           Assessment/Plan  1. Obstructing mass at splenic flexure 2. S/p ex lap with colectomy and trans colostomy 3. Post op ileus  Plan: Cont NGT; await bowel function Cont mobilization    LOS: 3 days    Shemuel Harkleroad E 09/28/2011

## 2011-09-29 ENCOUNTER — Encounter (HOSPITAL_COMMUNITY): Payer: Self-pay | Admitting: Surgery

## 2011-09-29 DIAGNOSIS — I10 Essential (primary) hypertension: Secondary | ICD-10-CM

## 2011-09-29 LAB — BASIC METABOLIC PANEL
CO2: 26 mEq/L (ref 19–32)
Chloride: 100 mEq/L (ref 96–112)
GFR calc non Af Amer: 87 mL/min — ABNORMAL LOW (ref >90)
Glucose, Bld: 112 mg/dL — ABNORMAL HIGH (ref 70–99)
Potassium: 4 mEq/L (ref 3.5–5.1)
Sodium: 134 mEq/L — ABNORMAL LOW (ref 135–145)

## 2011-09-29 MED ORDER — LABETALOL HCL 5 MG/ML IV SOLN
20.0000 mg | INTRAVENOUS | Status: DC | PRN
  Administered 2011-09-29 – 2011-10-01 (×4): 20 mg via INTRAVENOUS
  Filled 2011-09-29 (×5): qty 4

## 2011-09-29 MED ORDER — METOPROLOL TARTRATE 1 MG/ML IV SOLN
5.0000 mg | Freq: Four times a day (QID) | INTRAVENOUS | Status: DC | PRN
  Administered 2011-09-29: 5 mg via INTRAVENOUS
  Filled 2011-09-29 (×2): qty 5

## 2011-09-29 NOTE — Progress Notes (Signed)
Patient ID: Aaron Roth, male   DOB: [birth year], 65 y.o.   MRN: [id number] 4 Days Post-Op  Subjective: Pt quite upset.  Feels like no one is working on "his time"  His ostomy ruptured last pm.  He pulled his NGT out last pm.  No nausea today.  Objective: Vital signs in last 24 hours: Temp:  [98.7 F (37.1 C)-99.6 F (37.6 C)] 98.7 F (37.1 C) (11/27 0600) Pulse Rate:  [82-92] 82  (11/27 0600) Resp:  [14-20] 18  (11/27 0600) BP: (172-191)/(89-99) 172/89 mmHg (11/27 0600) SpO2:  [95 %-100 %] 96 % (11/27 0600)    Intake/Output from previous day: 11/26 0701 - 11/27 0700 In: 3587.5 [I.V.:3587.5] Out: 2276 [Urine:1225; Emesis/NG output:525; Stool:526] Intake/Output this shift:    PE: Abd: soft, few BS, ostomy with air.  Just changed, already leaking.  Wicks are contaminated with stool.  Tried to d/c but pt currently refusing.  Lab Results:   [facility] 09/28/11 0510 09/27/11 0815  WBC 6.3 4.9  HGB 12.9* 12.8*  HCT 38.6* 38.5*  PLT 345 330   BMET  Basename 09/29/11 0436 09/28/11 0510  NA 134* 131*  K 4.0 3.4*  CL 100 97  CO2 26 26  GLUCOSE 112* 104*  BUN 7 7  CREATININE 0.87 0.82  CALCIUM 8.7 8.0*   PT/INR No results found for this basename: LABPROT:2,INR:2 in the last 72 hours   Studies/Results: No results found.  Anti-infectives: Anti-infectives     Start     Dose/Rate Route Frequency Ordered Stop   09/25/11 1700   cefOXitin (MEFOXIN) 1 g in dextrose 5 % 50 mL IVPB        1 g 100 mL/hr over 30 Minutes Intravenous Every 6 hours 09/25/11 1249 09/26/11 0630   09/25/11 1045   cefOXitin (MEFOXIN) 2 g in dextrose 5 % 50 mL IVPB  Status:  Discontinued        2 g 100 mL/hr over 30 Minutes Intravenous  Once 09/25/11 1033 09/25/11 1517           Assessment/Plan  1. S/p ex lap with colectomy/ostomy for colon mass 2. Post-op ileus, resolving 3. HTN (new onset)  Plan: 1. WOC, RN consult 2. Will have the RN d/c the pt's wicks later today 3. Add prn med for  BP 4. Will allow sips of clears today and see how he does with that.   LOS: 4 days    Hendricks Schwandt E 09/29/2011

## 2011-09-29 NOTE — Progress Notes (Signed)
Tolerating NG out, much happier presently.  Progressing well  Mariella Saa MD, FACS  09/29/2011, 4:17 PM

## 2011-09-29 NOTE — Consult Note (Signed)
WOC ostomy consult  Stoma type/location:  Pt with colostomy surgery on 11/23, WOC consult requested today. Stomal assessment/size: Stoma large in size, 21/4 inches, above skin level,  pt has had several pouches leak r/t opening being cut too large. Peristomal assessment: Skin intact, small tan drainage in pouch. Ostomy pouching: 1pc pouch demonstrated since it has larger barrier opening to cut.  Pt able to apply and remove clamp to empty.  Pt participated in pouch application and asked appropriate questions.   Member of ostomy team will follow-up for further teaching sessions.  Pouch may not hold seal very long r/t cutting close to edge of barrier to accomidate size of stoma.  Cammie Mcgee, RN, MSN, [employer]  [phone]

## 2011-09-29 NOTE — Clinical Documentation Improvement (Signed)
Hypertension Documentation Clarification Query  THIS DOCUMENT IS NOT A PERMANENT PART OF THE MEDICAL RECORD  Dear Aaron Roth  In an effort to better capture your patient's severity of illness, reflect appropriate length of stay and utilization of resources, a review of the patient medical record has revealed the following indicators.     Pt admitted with SBO.  According to pn 09/29/11 pt with HTN (new onset).  Please clarify whether or not the HTN can be further specified as one of the diagnoses listed below and document in pn or d/c summary   " Accelerated Hypertension  " Malignant Hypertension  " Or Other Condition __________________________  " Cannot Clinically Determine   Supporting Information:  Risk Factors:   Treatment: Metoprolol monitoring    You may use possible, probable, or suspect with inpatient documentation. Possible, probable, suspected diagnoses MUST be documented at the time of discharge.  Based on your clinical judgment, please clarify and document in a progress note and/or discharge summary the clinical condition associated with the following supporting information:  In responding to this query please exercise your independent judgment.  The fact that a query is asked, does not imply that any particular answer is desired or expected.  Reviewed:  no additional documentation provided 10/01/11   Thank You,  Sincerely, Cindie Laroche. Ambrose Mantle RN, BSN, CCDS Clinical Documentation Specialist [facility] Dept Pager: [phone]   Health Information Management [place]  TO RESPOND TO THE THIS QUERY, FOLLOW THE INSTRUCTIONS BELOW:  1. If needed, update documentation for the patient's encounter via the notes activity.  2. Access this query again and click edit on the [employer].  3. After updating, or not, click F2 to complete all highlighted (required) fields concerning your review. Select "additional documentation in the  medical record" OR "no additional documentation provided".  4. Click Sign note button.  5. The deficiency will fall out of your InBasket *Please let us know if you are not able to compete this workflow by phone or e-mail (listed below).

## 2011-09-29 NOTE — Progress Notes (Signed)
Order for picc line, iv team nurses arrived, explained procedure to patient. Patient did not want the picc line placed. PA K Earl Gala notified, stated as long as peripheral iv site is patent can wait on picc line placement. Explained to patient, verbalized understanding and agreement.

## 2011-09-30 LAB — BASIC METABOLIC PANEL
CO2: 24 mEq/L (ref 19–32)
Chloride: 100 mEq/L (ref 96–112)
Glucose, Bld: 103 mg/dL — ABNORMAL HIGH (ref 70–99)
Sodium: 134 mEq/L — ABNORMAL LOW (ref 135–145)

## 2011-09-30 MED ORDER — OXYCODONE-ACETAMINOPHEN 5-325 MG PO TABS: 1.0000 | ORAL_TABLET | ORAL | Status: DC | PRN

## 2011-09-30 NOTE — Progress Notes (Signed)
CM received referral.  See [facility] note in shadow chart. 

## 2011-09-30 NOTE — Progress Notes (Signed)
Patient ID: Aaron Roth, male   DOB: Jan 23, 1946, 65 y.o.   MRN: [id number] 5 Days Post-Op  Subjective: Pt feels better today.  No nausea.  Flatus and liquid BM in ostomy.  Path Pending still   Objective: Vital signs in last 24 hours: Temp:  [98.1 F (36.7 C)-98.5 F (36.9 C)] 98.5 F (36.9 C) (11/28 0520) Pulse Rate:  [69-83] 81  (11/28 0520) Resp:  [15-20] 18  (11/28 0743) BP: (162-186)/(95-111) 185/97 mmHg (11/28 0745) SpO2:  [95 %-100 %] 100 % (11/28 0743) Last BM Date: 09/29/11  Intake/Output from previous day: 11/27 0701 - 11/28 0700 In: 2978.8 [P.O.:340; I.V.:2638.8] Out: 750 [Urine:750] Intake/Output this shift:    PE: Abd: soft, +BS, incision c/d/i with staples, no evidence of infection.  Ostomy with liquid stool.  Stoma edematous but pink Ht: regular Lungs: CTAB  Lab Results:   Basename 09/28/11 0510  WBC 6.3  HGB 12.9*  HCT 38.6*  PLT 345   BMET  Basename 09/30/11 0450 09/29/11 0436  NA 134* 134*  K 3.5 4.0  CL 100 100  CO2 24 26  GLUCOSE 103* 112*  BUN 5* 7  CREATININE 0.79 0.87  CALCIUM 8.4 8.7   PT/INR No results found for this basename: LABPROT:2,INR:2 in the last 72 hours   Studies/Results: No results found.  Anti-infectives: Anti-infectives     Start     Dose/Rate Route Frequency Ordered Stop   09/25/11 1700   cefOXitin (MEFOXIN) 1 g in dextrose 5 % 50 mL IVPB        1 g 100 mL/hr over 30 Minutes Intravenous Every 6 hours 09/25/11 1249 09/26/11 0630   09/25/11 1045   cefOXitin (MEFOXIN) 2 g in dextrose 5 % 50 mL IVPB  Status:  Discontinued        2 g 100 mL/hr over 30 Minutes Intravenous  Once 09/25/11 1033 09/25/11 1517           Assessment/Plan  1. S/p Hartmann's 2. Post op ileus, resolving 3. Nicotine withdrawal, ? Cause for increase in BP  Plan: Adv to clear liquids D/w pt and family about a nicotine patch.  He will let me know tomorrow if he wants one Cont ambulation Cont ostomy teaching Will need HH at dc   LOS:  5 days    Shelisha Gautier E 09/30/2011

## 2011-09-30 NOTE — Plan of Care (Signed)
Problem: Phase II Progression Outcomes Goal: Tolerating diet Outcome: Progressing With clears

## 2011-09-30 NOTE — Progress Notes (Signed)
Pt examined, agree  Mariella Saa MD, FACS  09/30/2011, 7:21 PM

## 2011-10-01 MED ORDER — NICOTINE 14 MG/24HR TD PT24
14.0000 mg | MEDICATED_PATCH | Freq: Every day | TRANSDERMAL | Status: DC
  Filled 2011-10-01 (×3): qty 1

## 2011-10-01 NOTE — Progress Notes (Signed)
Patient ID: Aaron Roth, male   DOB: [birth year], 65 y.o.   MRN: [id number] 6 Days Post-Op  Subjective: Pt feels well.  Tolerating clears without any nausea.  Ostomy working well.  Objective: Vital signs in last 24 hours: Temp:  [97.8 F (36.6 C)-99.2 F (37.3 C)] 98.5 F (36.9 C) (11/29 0426) Pulse Rate:  [66-86] 79  (11/29 0426) Resp:  [12-20] 16  (11/29 0426) BP: (156-200)/(82-103) 159/89 mmHg (11/29 0426) SpO2:  [94 %-98 %] 97 % (11/29 0426) Last BM Date: 09/30/11  Intake/Output from previous day: 11/28 0701 - 11/29 0700 In: 2205 [P.O.:780; I.V.:1425] Out: 554 [Urine:304; Stool:250] Intake/Output this shift:    PE: Abd: soft, minimally tender.  +BS, incision c/d/i with staples present, no erythema or drainage.  Ostomy with liquid stool.  Lab Results:  No results found for this basename: WBC:2,HGB:2,HCT:2,PLT:2 in the last 72 hours BMET  [facility] 09/30/11 0450 09/29/11 0436  NA 134* 134*  K 3.5 4.0  CL 100 100  CO2 24 26  GLUCOSE 103* 112*  BUN 5* 7  CREATININE 0.79 0.87  CALCIUM 8.4 8.7   PT/INR No results found for this basename: LABPROT:2,INR:2 in the last 72 hours   Studies/Results: No results found.  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 S/p Hartmann's  Plan: 1. Pt request a nicotine patch today 2. Will adv to fulls today, if tolerates will give solid diet in the AM 3. Possible d/c tomorrow if tolerates solid diet. 4. F/u with onc as outpt.   LOS: 6 days    Siyah Mault E 10/01/2011

## 2011-10-02 MED ORDER — OXYCODONE-ACETAMINOPHEN 5-325 MG PO TABS: 1.0000 | ORAL_TABLET | ORAL | Status: AC | PRN

## 2011-10-02 NOTE — Progress Notes (Signed)
Patient ID: Aksh Swart, male   DOB: 24-Oct-1946, 65 y.o.   MRN: [id number] 7 Days Post-Op  Subjective: Pt feels well today.  No c/o.  Tolerating fulls  Objective: Vital signs in last 24 hours: Temp:  [98.5 F (36.9 C)-99.1 F (37.3 C)] 99.1 F (37.3 C) (11/30 0610) Pulse Rate:  [70-80] 73  (11/30 0610) Resp:  [12-16] 12  (11/30 0610) BP: (161-172)/(78-92) 163/84 mmHg (11/30 0610) SpO2:  [95 %-98 %] 98 % (11/30 0610) Last BM Date: 10/01/11  Intake/Output from previous day: 11/29 0701 - 11/30 0700 In: 3145 [P.O.:1000; I.V.:2145] Out: 5 [Urine:5] Intake/Output this shift:    PE: Abd: soft, ND, incision with one spot of tan drainage.  One staple removed.  No further evidence of infection.  Opening too small to pack.  Rest of staples inplace and incision looks great.  Ostomy with good output.  Stoma pink and viable.  Lab Results:  No results found for this basename: WBC:2,HGB:2,HCT:2,PLT:2 in the last 72 hours BMET  [facility] 09/30/11 0450  NA 134*  K 3.5  CL 100  CO2 24  GLUCOSE 103*  BUN 5*  CREATININE 0.79  CALCIUM 8.4   PT/INR No results found for this basename: LABPROT:2,INR:2 in the last 72 hours   Studies/Results: No results found.  Anti-infectives: Anti-infectives     Start     Dose/Rate Route Frequency Ordered Stop   09/25/11 1700   cefOXitin (MEFOXIN) 1 g in dextrose 5 % 50 mL IVPB        1 g 100 mL/hr over 30 Minutes Intravenous Every 6 hours 09/25/11 1249 09/26/11 0630   09/25/11 1045   cefOXitin (MEFOXIN) 2 g in dextrose 5 % 50 mL IVPB  Status:  Discontinued        2 g 100 mL/hr over 30 Minutes Intravenous  Once 09/25/11 1033 09/25/11 1517           Assessment/Plan  1. [place] CA of colon 2. S/p Hartmann's  Plan: Adv diet today F/u with cancer center F/u with [place] early next week for staple removal, then with surgeon in 2-3 weeks.   LOS: 7 days    Mickey Esguerra E 10/02/2011

## 2011-10-02 NOTE — Consult Note (Signed)
WOC ostomy consult  Stoma type/location: left lower quadrant Stomal assessment/size: 2inches round, raised Peristomal assessment: intact Treatment options for stomal/peristomal skin: none needed Output thin liquid/brown Ostomy pouching: 1pc. Cut to fit pouch with tape border and lock and roll closure Education provided: Booklet provided and reviewed.  Patient able to demonstrate lock and roll closure.  Patient understands the physiology behind passing mucus through the rectum.  Knows how to contact WOC nurse if questions arise. HHRN to follow. Ready for discharge when ok with surgeon. Ladona Mow, MSN, RN, [place], GNP [phone])

## 2011-10-02 NOTE — Progress Notes (Signed)
Patient examined and agree with above  Mariella Saa MD, FACS  10/02/2011, 6:53 PM

## 2011-10-02 NOTE — Discharge Summary (Signed)
Patient ID: Aaron Roth MRN: [id number] DOB/AGE: 04/29/1946 65 y.o.  Admit date: 09/25/2011 Discharge date: 10/02/2011  Procedures: Laparotomy with resection of left transverse and left colon with colostomy on 09-25-11  Consults: none  Reason for Admission:  The patient was noted to have increasing abd distention and pain on 09-20-11.  No prior GI history.  He then developed N/V and had essentially no BMs.  He came to the ED and had a CT scan which revealed a bowel obstruction at splenic flexure.  Admission Diagnoses: 1. Large bowel obstruction, ? Malignancy 2. Tobacco use  Hospital Course: The patient was admitted to the hospital and taken to the operating room where he underwent a colon resection with colostomy.  He tolerated the procedure well.  He had an NGT placed and kept this for several days secondary to post-op ileus.  On POD#4 the patient pulled his NGT out on his own.  At that time, he had actually had a lot of flatus and some liquid stool in his ostomy.  He was started on sips of clear liquids.  Over the next several days, his diet was able to be advanced as tolerates.  By POD #7 he was tolerating a regular diet and otherwise felt stable for d/c home.  He did have instructions for his ostomy by the WOC, RN.  His wound remained stable.  Staples are intact at time of discharge.  The patient's pathology did reveal adenocarcinoma with 2LNs positive.  He will be set up with outpatient oncology follow up.  He did have some HTN during the hospitalization.  This was possibly secondary to tobacco withdrawal.  He was placed on PRN Lopressor.  Given he has no hisotry of HTN, I am not sending the patient home on scheduled medicine.  He will follow-up with his PCP if he has continued HTN at home.  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 Qty: 40 tablet, Refills: 0        Discharge Instructions: Follow up with cancer center, will call and make appt prior to d/c Follow-up with RN at [facility] for staple removal next week Follow -up with surgeon in 2-3 weeks after staple removal. F/u with PCP for further HTN   Signed: Nalanie Winiecki E 10/02/2011, 8:11 AM

## 2011-10-02 NOTE — Progress Notes (Signed)
Agree  Mariella Saa MD, FACS  10/02/2011, 2:20 PM

## 2011-10-02 NOTE — Discharge Summary (Signed)
Mariella Saa MD, FACS  10/02/2011, 2:21 PM

## 2011-10-03 ENCOUNTER — Telehealth: Payer: Self-pay | Admitting: Oncology

## 2011-10-03 NOTE — Telephone Encounter (Signed)
Have attempted to call this pt twice at the number listed on the demographics page. Was unable to reach the pt the voice mail box cuts you off during the message.

## 2011-10-06 ENCOUNTER — Ambulatory Visit (INDEPENDENT_AMBULATORY_CARE_PROVIDER_SITE_OTHER): Payer: Medicare Other

## 2011-10-06 DIAGNOSIS — Z4802 Encounter for removal of sutures: Secondary | ICD-10-CM

## 2011-10-06 NOTE — Patient Instructions (Signed)
Return for foolow up with Dr Ezzard Standing 10-14-11 at 11:15.

## 2011-10-06 NOTE — Progress Notes (Signed)
Patient came in for staple removal. Incision looks healed. Steri strips applied to area. Post op appointment made to see [facility].

## 2011-10-07 ENCOUNTER — Telehealth: Payer: Self-pay | Admitting: Internal Medicine

## 2011-10-07 NOTE — Telephone Encounter (Signed)
S/w the pt's wife and she is aware of the new pt appts on 10/16/2011@1 :30pm

## 2011-10-13 ENCOUNTER — Ambulatory Visit: Payer: Medicare Other

## 2011-10-13 ENCOUNTER — Other Ambulatory Visit: Payer: Medicare Other | Admitting: Lab

## 2011-10-14 ENCOUNTER — Encounter (INDEPENDENT_AMBULATORY_CARE_PROVIDER_SITE_OTHER): Payer: Self-pay | Admitting: Surgery

## 2011-10-14 ENCOUNTER — Ambulatory Visit (INDEPENDENT_AMBULATORY_CARE_PROVIDER_SITE_OTHER): Payer: Medicare Other | Admitting: Surgery

## 2011-10-14 VITALS — BP 126/84 | HR 72 | Temp 98.2°F | Resp 16 | Ht 70.0 in | Wt 187.4 lb

## 2011-10-14 DIAGNOSIS — Z85038 Personal history of other malignant neoplasm of large intestine: Secondary | ICD-10-CM

## 2011-10-14 NOTE — Progress Notes (Signed)
[facility]  Ovidio Kin, MD,  FACS [address]  [address] [place], [place]    [address] Phone:  [phone] FAX:  [phone]   Re:   Aaron Roth DOB:   16-Oct-1946 MRN:   [id number]  ASSESSMENT AND PLAN: 1.  Splenic flexure colon cancer.  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  Path - 4.0 cm mod differentiated adenoca, 4/15 nodes positive, KRAS positive (T3, N2), CEA - 1.2  Doing well post op.  He is to see Dr. Truett Perna in the next week to discuss adjuvant treatment.  I will see him back in 3 months.  2.  Smokes.  Has quit since surgery.  HISTORY OF PRESENT ILLNESS: Chief Complaint  Patient presents with  . Routine Post Op    PO colostomy 09/25/11   Aaron Roth is a 65 y.o. (DOB: Nov 27, 1945)  AA male who is a patient of [facility], MD, MD and comes to me today for follow up of an obstructing splenic flexure colon cancer.  This is a patient from [facility], who presented to the [facility] with an obstructing splenic flexure colon ca while I was on call over Thanksgiving holiday.  I did a resection and end ostomy on 09/25/2011.    His final path showed a T3, N2 colon ca.  He is set up to see Dr. Leonard Schwartz.Sherrill.   He has some minor complaints.  His ears still bother him.  This is left over from the NGT.  He still feels his throat is sore, but getting better.  And his hiccups are getting better.  The hiccups preceded the surgery and were probably secondary to his dilated colon.  His son and wife are with him.  I went over with him a porta cath and its placement, in case, Dr. Truett Perna decides he needs one.  We talked about colostomy reversal when his adjuvant treatment is over.  PHYSICAL EXAM: BP 126/84  Pulse 72  Temp(Src) 98.2 F (36.8 C) (Temporal)  Resp 16  Ht 5\' 10"  (1.778 m)  Wt 187 lb 6.4 oz (85.004 kg)  BMI 26.89 kg/m2  General: AA male who is alert and generally healthy appearing.  HEENT: Normal. Pupils equal.  Good dentition. Neck: Supple. No mass.  No thyroid mass.  Carotid pulse okay with no bruit. Lymph Nodes:  No supraclavicular or cervical nodes. Lungs: Clear to auscultation and symmetric breath sounds. Heart:  RRR. No murmur or rub. Abdomen: Well healed mid line incision.  Ostomy in LUQ is functioning and doing well.  I probed one area of his incision that was draining, but got very little out.  Extremities:  Good strength and ROM  in upper and lower extremities. Neurologic:  Grossly intact to motor and sensory function. Psychiatric: He is in good spirits today and looks much better than when I saw him in the hospital.   DATA REVIEWED: Reviewed path with patient.  Ovidio Kin, MD, FACS Office:  [phone]

## 2011-10-14 NOTE — Patient Instructions (Addendum)
1.  Incision looks good.  May shower and get wound wet.  Call for any problems.  2.  See me in about 3 months.

## 2011-10-15 ENCOUNTER — Encounter: Payer: Self-pay | Admitting: *Deleted

## 2011-10-16 ENCOUNTER — Telehealth: Payer: Self-pay | Admitting: Oncology

## 2011-10-16 ENCOUNTER — Other Ambulatory Visit: Payer: Self-pay | Admitting: *Deleted

## 2011-10-16 ENCOUNTER — Ambulatory Visit (HOSPITAL_BASED_OUTPATIENT_CLINIC_OR_DEPARTMENT_OTHER): Payer: Medicare Other | Admitting: Oncology

## 2011-10-16 ENCOUNTER — Other Ambulatory Visit: Payer: Medicare Other | Admitting: Lab

## 2011-10-16 ENCOUNTER — Ambulatory Visit: Payer: Medicare Other

## 2011-10-16 ENCOUNTER — Ambulatory Visit: Payer: Medicare Other | Admitting: Oncology

## 2011-10-16 VITALS — BP 131/75 | HR 93 | Temp 97.2°F | Ht 67.5 in | Wt 182.4 lb

## 2011-10-16 DIAGNOSIS — C185 Malignant neoplasm of splenic flexure: Secondary | ICD-10-CM

## 2011-10-16 DIAGNOSIS — Z85038 Personal history of other malignant neoplasm of large intestine: Secondary | ICD-10-CM

## 2011-10-16 NOTE — Progress Notes (Signed)
Referring MD: Tawni Carnes 65 y.o.  August 01, 1946    Reason for Referral: New diagnosis of   HPI: He reports the onset of abdominal pain over 3 days. He presented to the [facility] emergency room on November 22. A plain abdominal x-ray series revealed air-fluid levels in the large and small bowel. There was dilatation of the small bowel and colon. A CT revealed a bowel obstruction pattern with an abrupt transition point in the region of the splenic flexure. No lymphadenopathy. The liver and adrenal glands appeared unremarkable. No pleural or pericardial effusion. Images through the lung bases demonstrated no significant abnormality.  He was transferred to [place] and was taken to the operating room by Dr. Ezzard Standing on November 23. He underwent a colon resection with an in left transverse colon colostomy and Hartmann's pouch. There was no evidence of metastatic disease. The right and left lobes of the liver were unremarkable. There was a mass near the splenic flexure.  The pathology ([id number]) confirmed an invasive moderately differentiated adenocarcinoma invading through the muscular propria into the mesenteric fatty tissue. Angiolymphatic invasion was present. For a 15 pericolonic lymph nodes were positive for metastatic carcinoma with extranodal extension. The final resection margins were negative. A tumor deposit was present.  He reports an uneventful operative recovery.  Past Medical History  Diagnosis Date  . GERD (gastroesophageal reflux disease)   . "Gastritis "in the 1960s after drinking alcohol while in the [facility]  Past Surgical History  Procedure Date  . Ganglion cyst excision     wrist and foot  . Colostomy 09/25/2011    Procedure: COLOSTOMY;  Surgeon: Kandis Cocking, MD;  Location: [facility] ORS;  Service: General;  Laterality: N/A;  . Partial colectomy 09/25/2011    Procedure: PARTIAL COLECTOMY;  Surgeon: Kandis Cocking, MD;  Location: [facility] OR[facility];  Service: General;;     Family History  Problem Relation Age of Onset  . Bone cancer Brother   . ALS Sister   . Cancer Brother     bone, colon, testicular  . Cancer Father     prostate     Allergies:  Allergies  Allergen Reactions  . Shrimp (Shellfish Allergy) Hives    Prebattered shrimp only    Social History: He is retired and lives in [place]. He is currently staying with his in [place]. He was in the [employer] for 6 years. He smokes one half pack of cigarettes per day. He quit drinking alcohol years ago. He has no transfusion history. He denies risk factors for HIV and hepatitis.  ROS:   Positives include: Intentional weight loss with diet and exercise prior to surgery.  A complete ROS was otherwise negative.  Physical Exam:  Blood pressure 131/75, pulse 93, temperature 97.2 F (36.2 C), height 5' 7.5" (1.715 m), weight 182 lb 6.4 oz (82.736 kg).  HEENT: Oropharynx without visible mass. Neck without mass Lungs: Clear bilaterally. Distant breath sounds at the upper posterior chest bilaterally. Cardiac: Regular rate and rhythm Abdomen: Low abdomen colostomy with Brown stool. The midline incision is almost completely healed. No evidence for infection. No hepatomegaly. GU: Testes without mass  Vascular: No leg edema Lymph nodes: No cervical, supraclavicular, axillary, or inguinal lymph nodes Neurologic: Alert and oriented. The motor examination appears grossly intact. Skin: There is dry skin at the legs. Musculoskeletal: There is a soft mobile cutaneous mass at the left upper back   LAB:  CBC  Lab Results  Component Value Date  WBC 6.3 09/28/2011   HGB 12.9* 09/28/2011   HCT 38.6* 09/28/2011   MCV 85.0 09/28/2011   PLT 345 09/28/2011     CMP      Component Value Date/Time   NA 134* 09/30/2011 0450   K 3.5 09/30/2011 0450   CL 100 09/30/2011 0450   CO2 24 09/30/2011 0450   GLUCOSE 103* 09/30/2011 0450   BUN 5* 09/30/2011 0450   CREATININE 0.79 09/30/2011 0450    CALCIUM 8.4 09/30/2011 0450   PROT 7.9 09/25/2011 0219   ALBUMIN 4.1 09/25/2011 0219   AST 11 09/25/2011 0219   ALT 9 09/25/2011 0219   ALKPHOS 63 09/25/2011 0219   BILITOT 0.7 09/25/2011 0219   GFRNONAA >90 09/30/2011 0450   GFRAA >90 09/30/2011 0450   CEA on 09/25/2011-1.3   Assessment/Plan:   1. 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    Disposition:   Mr. Bacallao has been diagnosed with stage III colon cancer. I discussed the diagnosis, prognosis, and adjuvant treatment options in detail with the patient and his family. We reviewed the surgical pathology. He has a significant chance of developing recurrent colon cancer in the absence of adjuvant systemic therapy. I reviewed the data supporting a benefit for adjuvant 5 fluorouracil-based chemotherapy in this setting. We also discussed the additional benefit with the addition of oxaliplatin.  I recommend 6 months of adjuvant 5-fluorouracil-based chemotherapy. We discussed the equivalence of the FOLFOX and CAPOX chemotherapy regimens. We discussed the specifics of these regimens. He lives in [place], but would like to receive his care at the [facility]. After discussing the pros and cons of both regimens he decided to proceed with CAPOX.  We will ask Dr. Ezzard Standing to place a Port-A-Cath for the administration of chemotherapy. He will attend a chemotherapy teaching class. He will return for an office visit and further discussion on December 27. He is scheduled for a first cycle of chemotherapy on January 2.  I reviewed the potential toxicities associated with the CAPOX regimen including the chance for nausea/vomiting, mucositis, diarrhea, and hematologic toxicity. We discussed the hand-foot syndrome and skin toxicity associated with Xeloda. We discussed the various types of neuropathy associated with  oxaliplatin.  Mayan Dolney BRADLEY 10/16/2011, 7:14 PM

## 2011-10-16 NOTE — Telephone Encounter (Signed)
Gv pt appts for 10/29/2011.  Called Dr/ Newman's office lmovm for port placement before 11/04/2011

## 2011-10-16 NOTE — Telephone Encounter (Signed)
Patient came in today as a new patient,he has medicare part A only which is hospital,so i gave him an EPP Application.

## 2011-10-20 ENCOUNTER — Telehealth: Payer: Self-pay | Admitting: Oncology

## 2011-10-20 ENCOUNTER — Other Ambulatory Visit: Payer: Self-pay | Admitting: *Deleted

## 2011-10-20 DIAGNOSIS — C189 Malignant neoplasm of colon, unspecified: Secondary | ICD-10-CM

## 2011-10-20 NOTE — Telephone Encounter (Signed)
received a call from debbie from [facility] that MD has to put in a request for port placement in epic in order for them to schedule appt.  lmovm to nurse of info to gv to MD

## 2011-10-23 ENCOUNTER — Telehealth: Payer: Self-pay | Admitting: Oncology

## 2011-10-23 NOTE — Telephone Encounter (Signed)
Called patient and Aaron Roth, home number, concerning xeloda assistance.  I told him what I needed as far as financial documentation and advised him to call me if he would like assistance.

## 2011-10-29 ENCOUNTER — Telehealth: Payer: Self-pay | Admitting: Oncology

## 2011-10-29 ENCOUNTER — Encounter: Payer: Self-pay | Admitting: *Deleted

## 2011-10-29 ENCOUNTER — Ambulatory Visit (HOSPITAL_BASED_OUTPATIENT_CLINIC_OR_DEPARTMENT_OTHER): Payer: Medicare Other | Admitting: Oncology

## 2011-10-29 ENCOUNTER — Other Ambulatory Visit: Payer: Self-pay

## 2011-10-29 ENCOUNTER — Other Ambulatory Visit: Payer: Medicare Other

## 2011-10-29 VITALS — BP 183/95 | HR 98 | Temp 98.2°F | Ht 67.5 in | Wt 194.3 lb

## 2011-10-29 DIAGNOSIS — Z85038 Personal history of other malignant neoplasm of large intestine: Secondary | ICD-10-CM

## 2011-10-29 DIAGNOSIS — C186 Malignant neoplasm of descending colon: Secondary | ICD-10-CM

## 2011-10-29 NOTE — Progress Notes (Signed)
OFFICE PROGRESS NOTE   INTERVAL HISTORY:   He returns as scheduled. He has no complaint. He has attended a chemotherapy teaching class.   Objective:  Vital signs in last 24 hours:  Blood pressure 183/95, pulse 98, temperature 98.2 F (36.8 C), temperature source Oral, height 5' 7.5" (1.715 m), weight 194 lb 4.8 oz (88.134 kg).  Resp: Lungs clear bilaterally Cardio: Regular rate and rhythm GI: Healed midline incision. No hepatomegaly. Left lower quadrant colostomy.      Lab Results:  Lab Results  Component Value Date   WBC 6.3 09/28/2011   HGB 12.9* 09/28/2011   HCT 38.6* 09/28/2011   MCV 85.0 09/28/2011   PLT 345 09/28/2011      Medications: I have reviewed the patient's current medications.  Assessment/Plan: 1. 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     Disposition:  He appears well. The plan is to begin CAPOX chemotherapy on January 3. We will ask Dr. Ezzard Standing to place a Port-A-Cath prior to the beginning of chemotherapy. I reviewed the potential toxicities associated with the CAPOX chemotherapy regimen including the chance for nausea, hematologic toxicity, and diarrhea. We discussed the hand-foot syndrome associated with Xeloda and the neuropathy associated with oxaliplatin.  He will return for an office visit and cycle 2 of CAPOX on January 23.   Lucile Shutters, MD  10/29/2011  5:12 PM

## 2011-10-29 NOTE — Telephone Encounter (Signed)
Aaron Roth brought me his social security information, faxed xeloda application to [employer].

## 2011-10-29 NOTE — Telephone Encounter (Signed)
gve the pt his jan 2013 appt calendar °

## 2011-11-02 ENCOUNTER — Encounter (HOSPITAL_BASED_OUTPATIENT_CLINIC_OR_DEPARTMENT_OTHER): Payer: Self-pay | Admitting: *Deleted

## 2011-11-02 ENCOUNTER — Other Ambulatory Visit (INDEPENDENT_AMBULATORY_CARE_PROVIDER_SITE_OTHER): Payer: Self-pay | Admitting: General Surgery

## 2011-11-02 ENCOUNTER — Telehealth: Payer: Self-pay | Admitting: *Deleted

## 2011-11-02 NOTE — Telephone Encounter (Signed)
Dr. Ezzard Standing not available. Dr. Carolynne Edouard will place [facility] on 11/05/11 at [facility] center at 12:30pm. Patient has been notified. Called Debbie back and requested via voice mail that port remain accessed for his 11/06/11 chemo tx.

## 2011-11-02 NOTE — Progress Notes (Signed)
Had colectomy-colostomy-for pac

## 2011-11-04 ENCOUNTER — Encounter: Payer: Self-pay | Admitting: Oncology

## 2011-11-04 ENCOUNTER — Other Ambulatory Visit: Payer: Self-pay | Admitting: Oncology

## 2011-11-04 NOTE — Progress Notes (Signed)
PATIENT APPROVED FOR FINANCIAL ASSISTANCE 15% FOR FAMILY OF 2, INCOME [phone]

## 2011-11-05 ENCOUNTER — Telehealth: Payer: Self-pay | Admitting: *Deleted

## 2011-11-05 ENCOUNTER — Other Ambulatory Visit: Payer: Self-pay | Admitting: *Deleted

## 2011-11-05 ENCOUNTER — Encounter (HOSPITAL_BASED_OUTPATIENT_CLINIC_OR_DEPARTMENT_OTHER)
Admission: RE | Disposition: A | Discharge status: Home / Self Care | Payer: Self-pay | Source: Ambulatory Visit | Attending: General Surgery

## 2011-11-05 ENCOUNTER — Other Ambulatory Visit (INDEPENDENT_AMBULATORY_CARE_PROVIDER_SITE_OTHER): Payer: Self-pay | Admitting: Surgery

## 2011-11-05 ENCOUNTER — Ambulatory Visit (HOSPITAL_BASED_OUTPATIENT_CLINIC_OR_DEPARTMENT_OTHER)
Admission: RE | Admit: 2011-11-05 | Discharge: 2011-11-05 | Disposition: A | Discharge status: Home / Self Care | Payer: Medicare Other | Source: Ambulatory Visit | Attending: General Surgery | Admitting: General Surgery

## 2011-11-05 DIAGNOSIS — C189 Malignant neoplasm of colon, unspecified: Secondary | ICD-10-CM

## 2011-11-05 DIAGNOSIS — Z538 Procedure and treatment not carried out for other reasons: Secondary | ICD-10-CM | POA: Insufficient documentation

## 2011-11-05 HISTORY — DX: Malignant (primary) neoplasm, unspecified: C80.1

## 2011-11-05 HISTORY — DX: Encounter for attention to colostomy: Z43.3

## 2011-11-05 SURGERY — INSERTION, TUNNELED CENTRAL VENOUS DEVICE, WITH PORT
Anesthesia: General

## 2011-11-05 MED ORDER — PROCHLORPERAZINE MALEATE 10 MG PO TABS: 10.0000 mg | ORAL_TABLET | Freq: Four times a day (QID) | ORAL | Status: DC | PRN

## 2011-11-05 MED ORDER — CAPECITABINE 500 MG PO TABS: 2000.0000 mg | ORAL_TABLET | Freq: Two times a day (BID) | ORAL | Status: DC

## 2011-11-05 MED ORDER — LIDOCAINE-PRILOCAINE 2.5-2.5 % EX CREA: TOPICAL_CREAM | CUTANEOUS | Status: DC | PRN

## 2011-11-05 NOTE — Telephone Encounter (Signed)
Patient returned nurse call re: status of Xeloda. He has not received shipment of drug or heard from anyone. En route to have PAC placed now. Due to begin treatment 11/06/11. Left message with Axel Filler in managed care department to follow up with PAP application that was sent in mid December.

## 2011-11-05 NOTE — Telephone Encounter (Signed)
Spoke with patient and he has spoken with Clint Bolder will be delivered to him on 1/9 or 1/10. Made him aware of PAC placement by Dr. Ezzard Standing on 1/8--Debbie at [facility] will be calling with arrangements. He is aware of chemo appointment for 11/13/11 at 12 noon. Made him aware of EMLA cream purpose and application and Compazine. Both scripts at [facility] and he will not need to pay for these per financial counselor, Terald Sleeper. He wishes to stop smoking and PCP gave him script for Chantix--asking if OK to use this while going through chemo?

## 2011-11-05 NOTE — Telephone Encounter (Signed)
Patient presented to surgical center today for [facility] placement, but had not been NPO as instructed. [facility] attempted to arrange for intervenitonal radiology dept to place port tomorrow to keep chemo on schedule, but no availability. PAC rescheduled for Dr. Ezzard Standing to place port on 11/10/11 per Debbie--chemo rescheduled for 11/13/11 per chemo schedule availability.

## 2011-11-05 NOTE — Anesthesia Preprocedure Evaluation (Addendum)
Anesthesia Evaluation  Patient identified by MRN, date of birth, ID band Patient awake    Reviewed: Allergy & Precautions, H&P , NPO status   Airway Mallampati: I TM Distance: >3 FB Neck ROM: Full    Dental  (+) Teeth Intact, Dental Advisory Given and Loose   Pulmonary  clear to auscultation        Cardiovascular Regular Normal    Neuro/Psych    GI/Hepatic   Endo/Other    Renal/GU      Musculoskeletal   Abdominal   Peds  Hematology   Anesthesia Other Findings   Reproductive/Obstetrics                         Anesthesia Physical Anesthesia Plan  ASA: III  Anesthesia Plan: General   Post-op Pain Management:    Induction: Intravenous  Airway Management Planned: LMA  Additional Equipment:   Intra-op Plan:   Post-operative Plan: Extubation in OR  Informed Consent: I have reviewed the patients History and Physical, chart, labs and discussed the procedure including the risks, benefits and alternatives for the proposed anesthesia with the patient or authorized representative who has indicated his/her understanding and acceptance.   Dental advisory given  Plan Discussed with: CRNA, Anesthesiologist and Surgeon  Anesthesia Plan Comments:         Anesthesia Quick Evaluation

## 2011-11-06 ENCOUNTER — Ambulatory Visit: Payer: Medicare Other

## 2011-11-06 ENCOUNTER — Other Ambulatory Visit: Payer: Medicare Other | Admitting: Lab

## 2011-11-06 ENCOUNTER — Telehealth: Payer: Self-pay | Admitting: Oncology

## 2011-11-06 NOTE — Progress Notes (Signed)
R/s from 11/05/11

## 2011-11-06 NOTE — Telephone Encounter (Signed)
lmonvm advising the pt of his cancelled appts on 11/25/2011 and the new appts for feb 2013. Pt is aware to pick up an appt calendar the next time he is in the bldg.

## 2011-11-09 ENCOUNTER — Encounter: Payer: Self-pay | Admitting: Oncology

## 2011-11-09 NOTE — Progress Notes (Signed)
Aaron Roth called irritated that he never received a shipment and I agree.  I called [employer]. per rep they show that Aaron Roth has Medicare B, which covers 80% of xeloda.  I faxed them a copy of his Medicare card showing he only has part A; they will reinvestigate his benefits and get back with him regarding free medication.

## 2011-11-10 ENCOUNTER — Encounter (HOSPITAL_BASED_OUTPATIENT_CLINIC_OR_DEPARTMENT_OTHER): Payer: Self-pay | Admitting: *Deleted

## 2011-11-10 ENCOUNTER — Ambulatory Visit (HOSPITAL_COMMUNITY): Payer: Medicare Other

## 2011-11-10 ENCOUNTER — Encounter (HOSPITAL_BASED_OUTPATIENT_CLINIC_OR_DEPARTMENT_OTHER)
Admission: RE | Disposition: A | Discharge status: Home / Self Care | Payer: Self-pay | Source: Ambulatory Visit | Attending: Surgery

## 2011-11-10 ENCOUNTER — Ambulatory Visit (HOSPITAL_BASED_OUTPATIENT_CLINIC_OR_DEPARTMENT_OTHER): Payer: Medicare Other | Admitting: *Deleted

## 2011-11-10 ENCOUNTER — Ambulatory Visit (HOSPITAL_BASED_OUTPATIENT_CLINIC_OR_DEPARTMENT_OTHER)
Admission: RE | Admit: 2011-11-10 | Discharge: 2011-11-10 | Disposition: A | Discharge status: Home / Self Care | Payer: Medicare Other | Source: Ambulatory Visit | Attending: Surgery | Admitting: Surgery

## 2011-11-10 DIAGNOSIS — C189 Malignant neoplasm of colon, unspecified: Secondary | ICD-10-CM

## 2011-11-10 DIAGNOSIS — Z85038 Personal history of other malignant neoplasm of large intestine: Secondary | ICD-10-CM

## 2011-11-10 HISTORY — PX: PORTACATH PLACEMENT: SHX2246

## 2011-11-10 LAB — POCT HEMOGLOBIN-HEMACUE: Hemoglobin: 14.7 g/dL (ref 13.0–17.0)

## 2011-11-10 SURGERY — INSERTION, TUNNELED CENTRAL VENOUS DEVICE, WITH PORT
Anesthesia: General | Site: Chest | Laterality: Left | Wound class: Clean

## 2011-11-10 MED ORDER — LIDOCAINE HCL (CARDIAC) 20 MG/ML IV SOLN
INTRAVENOUS | Status: DC | PRN
  Administered 2011-11-10: 100 mg via INTRAVENOUS

## 2011-11-10 MED ORDER — SODIUM CHLORIDE 0.9 % IR SOLN
Status: DC | PRN
  Administered 2011-11-10: 10:00:00

## 2011-11-10 MED ORDER — CEFAZOLIN SODIUM-DEXTROSE 2-3 GM-% IV SOLR: 2.0000 g | INTRAVENOUS | Status: DC

## 2011-11-10 MED ORDER — HEPARIN SOD (PORK) LOCK FLUSH 100 UNIT/ML IV SOLN
INTRAVENOUS | Status: DC | PRN
  Administered 2011-11-10: 500 [IU] via INTRAVENOUS

## 2011-11-10 MED ORDER — FENTANYL CITRATE 0.05 MG/ML IJ SOLN
INTRAMUSCULAR | Status: DC | PRN
  Administered 2011-11-10: 100 ug via INTRAVENOUS

## 2011-11-10 MED ORDER — PROPOFOL 10 MG/ML IV EMUL
INTRAVENOUS | Status: DC | PRN
  Administered 2011-11-10: 100 mg via INTRAVENOUS
  Administered 2011-11-10: 200 mg via INTRAVENOUS
  Administered 2011-11-10: 100 mg via INTRAVENOUS

## 2011-11-10 MED ORDER — MORPHINE SULFATE 2 MG/ML IJ SOLN: 0.0500 mg/kg | INTRAMUSCULAR | Status: DC | PRN

## 2011-11-10 MED ORDER — HYDROCODONE-ACETAMINOPHEN 5-325 MG PO TABS: 1.0000 | ORAL_TABLET | Freq: Four times a day (QID) | ORAL | Status: AC | PRN

## 2011-11-10 MED ORDER — HYDROMORPHONE HCL PF 1 MG/ML IJ SOLN: 0.2500 mg | INTRAMUSCULAR | Status: DC | PRN

## 2011-11-10 MED ORDER — LACTATED RINGERS IV SOLN
INTRAVENOUS | Status: DC
  Administered 2011-11-10 (×2): via INTRAVENOUS

## 2011-11-10 MED ORDER — ONDANSETRON HCL 4 MG/2ML IJ SOLN: 4.0000 mg | Freq: Once | INTRAMUSCULAR | Status: DC | PRN

## 2011-11-10 MED ORDER — DEXAMETHASONE SODIUM PHOSPHATE 4 MG/ML IJ SOLN
INTRAMUSCULAR | Status: DC | PRN
  Administered 2011-11-10: 10 mg via INTRAVENOUS

## 2011-11-10 MED ORDER — CEFAZOLIN SODIUM 1-5 GM-% IV SOLN
1.0000 g | INTRAVENOUS | Status: AC
  Administered 2011-11-10: 2 g via INTRAVENOUS

## 2011-11-10 MED ORDER — SUCCINYLCHOLINE CHLORIDE 20 MG/ML IJ SOLN
INTRAMUSCULAR | Status: DC | PRN
  Administered 2011-11-10: 100 mg via INTRAVENOUS

## 2011-11-10 MED ORDER — MEPERIDINE HCL 25 MG/ML IJ SOLN: 6.2500 mg | INTRAMUSCULAR | Status: DC | PRN

## 2011-11-10 MED ORDER — LIDOCAINE HCL (PF) 1 % IJ SOLN
INTRAMUSCULAR | Status: DC | PRN
  Administered 2011-11-10 (×2): 5 mL

## 2011-11-10 MED ORDER — ONDANSETRON HCL 4 MG/2ML IJ SOLN
INTRAMUSCULAR | Status: DC | PRN
  Administered 2011-11-10: 4 mg via INTRAVENOUS

## 2011-11-10 MED ORDER — EPHEDRINE SULFATE 50 MG/ML IJ SOLN
INTRAMUSCULAR | Status: DC | PRN
  Administered 2011-11-10: 10 mg via INTRAVENOUS

## 2011-11-10 SURGICAL SUPPLY — 47 items
BAG DECANTER FOR FLEXI CONT (MISCELLANEOUS) ×2 IMPLANT
BENZOIN TINCTURE PRP APPL 2/3 (GAUZE/BANDAGES/DRESSINGS) ×2 IMPLANT
BLADE SURG 10 STRL SS (BLADE) IMPLANT
BLADE SURG 15 STRL LF DISP TIS (BLADE) ×1 IMPLANT
BLADE SURG 15 STRL SS (BLADE) ×1
CHLORAPREP W/TINT 26ML (MISCELLANEOUS) ×2 IMPLANT
CLEANER CAUTERY TIP 5X5 PAD (MISCELLANEOUS) ×1 IMPLANT
CLOTH BEACON ORANGE TIMEOUT ST (SAFETY) ×2 IMPLANT
COVER KIT LFREE 14X147CM (MISCELLANEOUS) IMPLANT
COVER MAYO STAND STRL (DRAPES) ×2 IMPLANT
COVER TABLE BACK 60X90 (DRAPES) ×2 IMPLANT
DECANTER SPIKE VIAL GLASS SM (MISCELLANEOUS) IMPLANT
DRAPE C-ARM 42X72 X-RAY (DRAPES) ×2 IMPLANT
DRAPE LAPAROTOMY T 102X78X121 (DRAPES) ×2 IMPLANT
DRAPE UTILITY XL STRL (DRAPES) ×2 IMPLANT
ELECT REM PT RETURN 9FT ADLT (ELECTROSURGICAL) ×2
ELECTRODE REM PT RTRN 9FT ADLT (ELECTROSURGICAL) ×1 IMPLANT
GAUZE SPONGE 4X4 12PLY STRL LF (GAUZE/BANDAGES/DRESSINGS) ×4 IMPLANT
GLOVE BIOGEL PI IND STRL 6.5 (GLOVE) ×2 IMPLANT
GLOVE BIOGEL PI INDICATOR 6.5 (GLOVE) ×2
GLOVE SURG SIGNA 7.5 PF LTX (GLOVE) ×2 IMPLANT
GOWN PREVENTION PLUS XLARGE (GOWN DISPOSABLE) ×2 IMPLANT
GOWN PREVENTION PLUS XXLARGE (GOWN DISPOSABLE) ×2 IMPLANT
IV CATH AUTO 14GX1.75 SAFE ORG (IV SOLUTION) IMPLANT
IV CATH PLACEMENT UNIT 16 GA (IV SOLUTION) IMPLANT
IV KIT MINILOC 20X1 SAFETY (NEEDLE) IMPLANT
KIT POWER CATH 8FR (Catheter) ×2 IMPLANT
NEEDLE HYPO 22GX1.5 SAFETY (NEEDLE) ×2 IMPLANT
NEEDLE HYPO 25X1 1.5 SAFETY (NEEDLE) ×2 IMPLANT
PACK BASIN DAY SURGERY FS (CUSTOM PROCEDURE TRAY) ×2 IMPLANT
PAD CLEANER CAUTERY TIP 5X5 (MISCELLANEOUS) ×1
PENCIL BUTTON HOLSTER BLD 10FT (ELECTRODE) ×2 IMPLANT
SET SHEATH INTRODUCER 10FR (MISCELLANEOUS) IMPLANT
SHEATH COOK PEEL AWAY SET 9F (SHEATH) IMPLANT
SLEEVE SCD COMPRESS KNEE MED (MISCELLANEOUS) IMPLANT
SPONGE GAUZE 4X4 12PLY (GAUZE/BANDAGES/DRESSINGS) ×2 IMPLANT
STRIP CLOSURE SKIN 1/4X4 (GAUZE/BANDAGES/DRESSINGS) ×2 IMPLANT
SUT ETHILON 2 0 FS 18 (SUTURE) IMPLANT
SUT ETHILON 3 0 PS 1 (SUTURE) IMPLANT
SUT VIC AB 5-0 PS2 18 (SUTURE) ×2 IMPLANT
SUT VICRYL 3-0 CR8 SH (SUTURE) ×2 IMPLANT
SYR 5ML LUER SLIP (SYRINGE) ×2 IMPLANT
SYR CONTROL 10ML LL (SYRINGE) ×2 IMPLANT
TAPE CLOTH SURG 4X10 WHT LF (GAUZE/BANDAGES/DRESSINGS) ×2 IMPLANT
TOWEL OR 17X24 6PK STRL BLUE (TOWEL DISPOSABLE) ×4 IMPLANT
TOWEL OR NON WOVEN STRL DISP B (DISPOSABLE) ×2 IMPLANT
WATER STERILE IRR 1000ML POUR (IV SOLUTION) ×2 IMPLANT

## 2011-11-10 NOTE — H&P (View-Only) (Signed)
[facility]  Aaron Willison, MD,  FACS [address]  [address] [place], [place]    [address] Phone:  [phone] FAX:  [phone]   Re:   Aaron Roth DOB:   10/05/1946 MRN:   [id number]  ASSESSMENT AND PLAN: 1.  Splenic flexure colon cancer.  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  Path - 4.0 cm mod differentiated adenoca, 4/15 nodes positive, KRAS positive (T3, N2), CEA - 1.2  Doing well post op.  He is to see Dr. Sherrill in the next week to discuss adjuvant treatment.  I will see him back in 3 months.  2.  Smokes.  Has quit since surgery.  HISTORY OF PRESENT ILLNESS: Chief Complaint  Patient presents with  . Routine Post Op    PO colostomy 09/25/11   Aaron Roth is a 66 y.o. (DOB: 08/09/1946)  AA male who is a patient of [facility]AN[facility],KEITH, MD, MD and comes to me today for follow up of an obstructing splenic flexure colon cancer.  This is a patient from [place], who presented to the [facility] with an obstructing splenic flexure colon ca while I was on call over Thanksgiving holiday.  I did a resection and end ostomy on 09/25/2011.    His final path showed a T3, N2 colon ca.  He is set up to see Dr. B.Sherrill.   He has some minor complaints.  His ears still bother him.  This is left over from the NGT.  He still feels his throat is sore, but getting better.  And his hiccups are getting better.  The hiccups preceded the surgery and were probably secondary to his dilated colon.  His son and wife are with him.  I went over with him a porta cath and its placement, in case, Dr. Sherrill decides he needs one.  We talked about colostomy reversal when his adjuvant treatment is over.  PHYSICAL EXAM: BP 126/84  Pulse 72  Temp(Src) 98.2 F (36.8 C) (Temporal)  Resp 16  Ht 5' 10" (1.778 m)  Wt 187 lb 6.4 oz (85.004 kg)  BMI 26.89 kg/m2  General: AA male who is alert and generally healthy appearing.  HEENT: Normal. Pupils equal.  Good dentition. Neck: Supple. No mass.  No thyroid mass.  Carotid pulse okay with no bruit. Lymph Nodes:  No supraclavicular or cervical nodes. Lungs: Clear to auscultation and symmetric breath sounds. Heart:  RRR. No murmur or rub. Abdomen: Well healed mid line incision.  Ostomy in LUQ is functioning and doing well.  I probed one area of his incision that was draining, but got very little out.  Extremities:  Good strength and ROM  in upper and lower extremities. Neurologic:  Grossly intact to motor and sensory function. Psychiatric: He is in good spirits today and looks much better than when I saw him in the hospital.   DATA REVIEWED: Reviewed path with patient.  Esli Clements, MD, FACS Office:  [phone]  

## 2011-11-10 NOTE — Anesthesia Procedure Notes (Addendum)
Procedure Name: LMA Insertion Date/Time: 11/10/2011 8:58 AM Performed by: Meyer Russel Pre-anesthesia Checklist: Patient identified, Emergency Drugs available, Suction available, Patient being monitored and Timeout performed Patient Re-evaluated:Patient Re-evaluated prior to inductionOxygen Delivery Method: Circle System Utilized Preoxygenation: Pre-oxygenation with 100% oxygen Intubation Type: IV induction Ventilation: Mask ventilation without difficulty LMA: LMA with gastric port inserted LMA Size: 5.0 Number of attempts: 3 (#4 and #5 Flexible LMA's inserted easily but unable to maintain good seal.  #5LMA Supreme inserted easlily with good seal and tiidal volume) Placement Confirmation: positive ETCO2 and breath sounds checked- equal and bilateral Tube secured with: Tape Dental Injury: Teeth and Oropharynx as per pre-operative assessment  Difficulty Due To: Difficult Airway- due to large tongue   Procedure Name: Intubation Date/Time: 11/10/2011 9:21 AM Performed by: Suann Larry WOLFE Pre-anesthesia Checklist: Patient identified, Timeout performed, Emergency Drugs available, Suction available and Patient being monitored Oxygen Delivery Method: Circle System Utilized Preoxygenation: Pre-oxygenation with 100% oxygen Intubation Type: Combination inhalational/ intravenous induction Ventilation: Mask ventilation without difficulty Laryngoscope Size: Miller and 2 Grade View: Grade I Tube type: Oral Tube size: 7.0 mm Number of attempts: 1 Airway Equipment and Method: stylet Placement Confirmation: ETT inserted through vocal cords under direct vision,  positive ETCO2 and breath sounds checked- equal and bilateral Secured at: 22 cm Tube secured with: Tape Dental Injury: Teeth and Oropharynx as per pre-operative assessment

## 2011-11-10 NOTE — Anesthesia Postprocedure Evaluation (Signed)
  Anesthesia Post-op Note  Patient: Aaron Roth  Procedure(s) Performed:  INSERTION PORT-A-CATH - left subclavian  Patient Location: PACU  Anesthesia Type: General  Level of Consciousness: awake, alert  and oriented  Airway and Oxygen Therapy: Patient Spontanous Breathing  Post-op Pain: mild  Post-op Assessment: Post-op Vital signs reviewed, Patient's Cardiovascular Status Stable, Respiratory Function Stable, Patent Airway, No signs of Nausea or vomiting, Adequate PO intake and Pain level controlled  Post-op Vital Signs: Reviewed and stable  Complications: No apparent anesthesia complications

## 2011-11-10 NOTE — Brief Op Note (Signed)
11/10/2011  9:55 AM  PATIENT:  Aaron Roth, 66 y.o., male, MRN: [id number]  PREOP DIAGNOSIS:  colon cancer  POSTOP DIAGNOSIS:   Colon cancer, anticipate chemotx  PROCEDURE:   Procedure(s): INSERTION PORT-A-CATH  SURGEON:   Ovidio Kin, M.D.  ASSISTANT:   none  ANESTHESIA:   general  Kerby Nora, MD - Anesthesiologist Onalee Hua Craft - CRNA  General  EBL:  min  ml  BLOOD ADMINISTERED: none  DRAINS: none   LOCAL MEDICATIONS USED:   10 cc 1% xylocaine  SPECIMEN:   none  COUNTS CORRECT:  YES  INDICATIONS FOR PROCEDURE:  Aaron Roth is a 66 y.o. (DOB: 05-14-1946) AA male whose primary care physician is [facility], MD, MD and comes for power port placement in anticipation of chemotx.   The indications and risks of the surgery were explained to the patient.  The risks include, but are not limited to, infection, bleeding, and nerve injury.  Note dictated to:   #[id number]  11/10/2011  DN

## 2011-11-10 NOTE — Transfer of Care (Signed)
Immediate Anesthesia Transfer of Care Note  Patient: Aaron Roth  Procedure(s) Performed:  INSERTION PORT-A-CATH - left subclavian  Patient Location: PACU  Anesthesia Type: General  Level of Consciousness: oriented and patient cooperative  Airway & Oxygen Therapy: Patient Spontanous Breathing and Patient connected to face mask oxygen  Post-op Assessment: Report given to PACU RN, Post -op Vital signs reviewed and stable and Patient moving all extremities  Post vital signs: Reviewed and stable  Complications: No apparent anesthesia complications

## 2011-11-10 NOTE — Interval H&P Note (Signed)
History and Physical Interval Note:  11/10/2011 8:13 AM  Aaron Roth  has presented today for surgery, with the diagnosis of colon caner  The various methods of treatment have been discussed with the patient and family.  Patient's wife is with him.  I showed him a model of the port a cath in there office.  After consideration of risks, benefits and other options for treatment, the patient has consented to  Procedure(s): INSERTION PORT-A-CATH as a surgical intervention .    The patients' history has been reviewed, patient examined, no change in status, stable for surgery.  I have reviewed the patients' chart and labs.  Questions were answered to the patient's satisfaction.     [name] H

## 2011-11-11 ENCOUNTER — Encounter (HOSPITAL_BASED_OUTPATIENT_CLINIC_OR_DEPARTMENT_OTHER): Payer: Self-pay | Admitting: Surgery

## 2011-11-11 NOTE — Op Note (Signed)
NAME:  Aaron, Roth NO.:  MEDICAL RECORD NO.:  [id number]  LOCATION:                                 FACILITY:  PHYSICIAN:  Sandria Bales. Ezzard Standing, M.D.  DATE OF BIRTH:  [birth year]  DATE OF PROCEDURE: 10 Nov 2011                              OPERATIVE REPORT  PREOPERATIVE DIAGNOSES:  Splenic flexure; colon cancer T3, N2; anticipate chemotherapy.  POSTOPERATIVE DIAGNOSIS:  Splenic flexure; colon cancer T3, N2; anticipate chemotherapy.  PROCEDURE:  Left subclavian power port insertion.  SURGEON:  Sandria Bales. Ezzard Standing, MD  FIRST ASSISTANT:  None.  ANESTHESIA:  General endotracheal with 10 mL of 1% Xylocaine.  COMPLICATIONS:  None.  INDICATION FOR PROCEDURE:  Aaron Roth is a 65 year old African American male who has a splenic flexure, colon cancer resected on September 25, 2011, by Dr. Ovidio Kin.  He has seen Dr. Rolm Baptise and discussed adjuvant chemotherapy.  I am placing a power port for IV access.  The indications and potential complications of procedure were explained to the patient.  The potential complications include, but not limited to, bleeding, infection, pneumothorax, thrombosis of the vein.  OPERATIVE NOTE:  The patient in a supine position with both his arms tucked in room #5 at [facility].  Dr. Sheldon Silvan, supervising anesthesiologist has started with an LMA, however, about mid case, this was switched to an endotracheal tube.  His chest and neck were prepped with ChloraPrep and sterilely draped.  He was given 2 g of Ancef initially procedure.  A time-out was held and surgical checklist run.  I accessed the left subclavian vein with a 16-gauge needle and threaded a guidewire through this and checked this with fluoroscopy showing positioning in the superior vena cava.  I then developed a pocket in the upper aspect of the left chest.  I sewed the reservoir in place with a 3-0 Vicryl suture, passed the silastic tubing from the  reservoir site to this left subclavian stick site into the superior vena cava and tried to position this at the junction of the superior vena cava and the right atrium.  This was flushed and aspirated well, and it was checked with fluoroscopy.  I then attached the silastic tubing to the Power port with the attachment device.  I checked the entire unit with fluoroscopy.  I flushed it first with about 3 mL of dilute heparin, which was 10 units/mL, and the entire unit with a 4 mL of concentrated heparin, which was 100 units/mL.  The wound was then irrigated with subcutaneous tissues closed with 3-0 Vicryl suture.  The skin closed with a 5-0 Vicryl suture, painted with tincture of benzoin and Steri-Stripped.  The patient tolerated the procedure well, got a chest x-ray in the recovery room.  I anticipate starting chemotherapy in 3 days.   Sandria Bales. Ezzard Standing, M.D., FACS   DHN/MEDQ  D:  11/10/2011  T:  11/10/2011  Job:  [id number]  cc:   Jeanmarie Plant, MD Ladene Artist, M.D.

## 2011-11-12 ENCOUNTER — Encounter: Payer: Self-pay | Admitting: *Deleted

## 2011-11-12 ENCOUNTER — Other Ambulatory Visit: Payer: Self-pay | Admitting: Oncology

## 2011-11-13 ENCOUNTER — Other Ambulatory Visit: Payer: Self-pay | Admitting: Oncology

## 2011-11-13 ENCOUNTER — Other Ambulatory Visit: Payer: Medicare Other | Admitting: Lab

## 2011-11-13 ENCOUNTER — Encounter: Payer: Self-pay | Admitting: *Deleted

## 2011-11-13 ENCOUNTER — Ambulatory Visit (HOSPITAL_BASED_OUTPATIENT_CLINIC_OR_DEPARTMENT_OTHER): Payer: Medicare Other

## 2011-11-13 VITALS — BP 158/96 | HR 75 | Temp 98.7°F

## 2011-11-13 DIAGNOSIS — Z5111 Encounter for antineoplastic chemotherapy: Secondary | ICD-10-CM

## 2011-11-13 DIAGNOSIS — Z85038 Personal history of other malignant neoplasm of large intestine: Secondary | ICD-10-CM

## 2011-11-13 DIAGNOSIS — C186 Malignant neoplasm of descending colon: Secondary | ICD-10-CM

## 2011-11-13 LAB — COMPREHENSIVE METABOLIC PANEL
ALT: 9 U/L (ref 0–53)
AST: 11 U/L (ref 0–37)
Chloride: 100 mEq/L (ref 96–112)
Creatinine, Ser: 0.87 mg/dL (ref 0.50–1.35)
Sodium: 138 mEq/L (ref 135–145)
Total Bilirubin: 0.4 mg/dL (ref 0.3–1.2)
Total Protein: 7.1 g/dL (ref 6.0–8.3)

## 2011-11-13 LAB — CBC WITH DIFFERENTIAL/PLATELET
BASO%: 0.5 % (ref 0.0–2.0)
Eosinophils Absolute: 0.1 10*3/uL (ref 0.0–0.5)
LYMPH%: 45.3 % (ref 14.0–49.0)
MCHC: 34.3 g/dL (ref 32.0–36.0)
MONO#: 0.5 10*3/uL (ref 0.1–0.9)
MONO%: 7.5 % (ref 0.0–14.0)
NEUT#: 2.9 10*3/uL (ref 1.5–6.5)
Platelets: 345 10*3/uL (ref 140–400)
RBC: 4.88 10*6/uL (ref 4.20–5.82)
RDW: 15.6 % — ABNORMAL HIGH (ref 11.0–14.6)
WBC: 6.4 10*3/uL (ref 4.0–10.3)
nRBC: 0 % (ref 0–0)

## 2011-11-13 MED ORDER — LORAZEPAM 0.5 MG PO TABS: 0.5000 mg | ORAL_TABLET | Freq: Four times a day (QID) | ORAL | Status: AC | PRN

## 2011-11-13 MED ORDER — PROCHLORPERAZINE 25 MG RE SUPP: 25.0000 mg | Freq: Two times a day (BID) | RECTAL | Status: DC | PRN

## 2011-11-13 MED ORDER — CAPECITABINE 500 MG PO TABS: 1000.0000 mg/m2 | ORAL_TABLET | Freq: Two times a day (BID) | ORAL | Status: DC

## 2011-11-13 MED ORDER — HEPARIN SOD (PORK) LOCK FLUSH 100 UNIT/ML IV SOLN
500.0000 [IU] | Freq: Once | INTRAVENOUS | Status: AC | PRN
Start: 2011-11-13 — End: 2011-11-13
  Administered 2011-11-13: 500 [IU]
  Filled 2011-11-13: qty 5

## 2011-11-13 MED ORDER — ONDANSETRON 8 MG/50ML IVPB (CHCC)
8.0000 mg | Freq: Once | INTRAVENOUS | Status: AC
  Administered 2011-11-13: 8 mg via INTRAVENOUS

## 2011-11-13 MED ORDER — SODIUM CHLORIDE 0.9 % IJ SOLN
10.0000 mL | INTRAMUSCULAR | Status: DC | PRN
  Administered 2011-11-13: 10 mL
  Filled 2011-11-13: qty 10

## 2011-11-13 MED ORDER — PROCHLORPERAZINE MALEATE 10 MG PO TABS: 10.0000 mg | ORAL_TABLET | Freq: Four times a day (QID) | ORAL | Status: DC | PRN

## 2011-11-13 MED ORDER — DEXAMETHASONE 4 MG PO TABS: ORAL_TABLET | ORAL | Status: DC

## 2011-11-13 MED ORDER — DEXAMETHASONE SODIUM PHOSPHATE 10 MG/ML IJ SOLN
10.0000 mg | Freq: Once | INTRAMUSCULAR | Status: AC
  Administered 2011-11-13: 10 mg via INTRAVENOUS

## 2011-11-13 MED ORDER — DEXTROSE 5 % IV SOLN
126.0000 mg/m2 | Freq: Once | INTRAVENOUS | Status: AC
  Administered 2011-11-13: 260 mg via INTRAVENOUS
  Filled 2011-11-13: qty 52

## 2011-11-13 MED ORDER — DEXTROSE 5 % IV SOLN
Freq: Once | INTRAVENOUS | Status: AC
  Administered 2011-11-13: 14:00:00 via INTRAVENOUS

## 2011-11-13 MED ORDER — ONDANSETRON HCL 8 MG PO TABS: ORAL_TABLET | ORAL | Status: DC

## 2011-11-13 NOTE — Patient Instructions (Signed)
Pt discharged with family ambulatory. Pt to call with concerns

## 2011-11-13 NOTE — Progress Notes (Signed)
Pt treated with Oxaliplatin. No reaction. Discussed avoiding cold food/drink.

## 2011-11-16 ENCOUNTER — Telehealth: Payer: Self-pay | Admitting: *Deleted

## 2011-11-16 NOTE — Telephone Encounter (Signed)
Aaron Roth is doing well.  Reports he is rebuking side effects but has had a decreased energy level.  Out side talking on the phone but will not be able to walk today.  Had a little tingling in his right foot that lasted 30 seconds.  Slight nausea but no emesis.  Drinking Gatorade and water.  Appetite decreased but has made himself eat smaller meals/snacks.  He slept later this morning and asked how to take Xeloda later today.  No further questions.  Asked that he call if any changes.

## 2011-11-16 NOTE — Telephone Encounter (Signed)
Message copied by Augusto Garbe on Mon Nov 16, 2011 12:18 PM ------      Message from: GARNER, [place] P      Created: Caleen Essex Nov 13, 2011  5:40 PM      Regarding: chemo follow up call       Pt 1st time Oxaliplatin. No reaction      Dr. Truett Perna      Pt phone =[phone]

## 2011-11-18 ENCOUNTER — Other Ambulatory Visit: Payer: Self-pay | Admitting: *Deleted

## 2011-11-18 ENCOUNTER — Telehealth: Payer: Self-pay | Admitting: *Deleted

## 2011-11-18 DIAGNOSIS — C189 Malignant neoplasm of colon, unspecified: Secondary | ICD-10-CM

## 2011-11-18 MED ORDER — CAPECITABINE 500 MG PO TABS: 2000.0000 mg | ORAL_TABLET | Freq: Two times a day (BID) | ORAL | Status: DC

## 2011-11-18 NOTE — Telephone Encounter (Signed)
Confirmed with his [facility] that Compazine and Zofran scripts have been waiting for him to pick up since 11/13/11 (patient reports being unaware of this). Cancelled the Decadron and Compazine suppository (not necessary). He is also requesting all his chemo appointments be changed from Fridays to Wednesdays due to his wife and son making arrangements to be off work on this day. Will have MD review his schedule and advise.

## 2011-11-19 ENCOUNTER — Other Ambulatory Visit: Payer: Self-pay | Admitting: *Deleted

## 2011-11-19 ENCOUNTER — Telehealth: Payer: Self-pay | Admitting: Oncology

## 2011-11-19 NOTE — Telephone Encounter (Signed)
called pt home lmovm that his appts on 02/01 where r/s to 01/30 @ 10:45am and to rtn cll to me confirm appt

## 2011-11-19 NOTE — Progress Notes (Signed)
Per Dr. Truett Perna: OK to see midlevel on 1/30 at 11:15--order to scheduler.

## 2011-11-25 ENCOUNTER — Ambulatory Visit: Payer: Medicare Other | Admitting: Oncology

## 2011-11-25 ENCOUNTER — Ambulatory Visit: Payer: Medicare Other

## 2011-11-25 ENCOUNTER — Other Ambulatory Visit: Payer: Medicare Other | Admitting: Lab

## 2011-12-01 ENCOUNTER — Other Ambulatory Visit: Payer: Self-pay | Admitting: Oncology

## 2011-12-02 ENCOUNTER — Ambulatory Visit (HOSPITAL_BASED_OUTPATIENT_CLINIC_OR_DEPARTMENT_OTHER): Payer: Medicare Other | Admitting: Nurse Practitioner

## 2011-12-02 ENCOUNTER — Ambulatory Visit (HOSPITAL_BASED_OUTPATIENT_CLINIC_OR_DEPARTMENT_OTHER): Payer: Self-pay

## 2011-12-02 ENCOUNTER — Other Ambulatory Visit (HOSPITAL_BASED_OUTPATIENT_CLINIC_OR_DEPARTMENT_OTHER): Payer: Medicare Other | Admitting: Lab

## 2011-12-02 ENCOUNTER — Ambulatory Visit: Payer: Self-pay | Admitting: Nutrition

## 2011-12-02 VITALS — BP 167/90 | HR 89 | Temp 98.8°F | Ht 67.5 in | Wt 192.6 lb

## 2011-12-02 DIAGNOSIS — Z5111 Encounter for antineoplastic chemotherapy: Secondary | ICD-10-CM

## 2011-12-02 DIAGNOSIS — C186 Malignant neoplasm of descending colon: Secondary | ICD-10-CM

## 2011-12-02 DIAGNOSIS — Z85038 Personal history of other malignant neoplasm of large intestine: Secondary | ICD-10-CM

## 2011-12-02 LAB — COMPREHENSIVE METABOLIC PANEL
Alkaline Phosphatase: 68 U/L (ref 39–117)
Creatinine, Ser: 0.95 mg/dL (ref 0.50–1.35)
Glucose, Bld: 82 mg/dL (ref 70–99)
Sodium: 140 mEq/L (ref 135–145)
Total Bilirubin: 0.3 mg/dL (ref 0.3–1.2)
Total Protein: 7.4 g/dL (ref 6.0–8.3)

## 2011-12-02 LAB — CBC WITH DIFFERENTIAL/PLATELET
Basophils Absolute: 0 10*3/uL (ref 0.0–0.1)
EOS%: 1.2 % (ref 0.0–7.0)
MCH: 29 pg (ref 27.2–33.4)
MCV: 85.1 fL (ref 79.3–98.0)
MONO%: 12.4 % (ref 0.0–14.0)
RBC: 4.83 10*6/uL (ref 4.20–5.82)
RDW: 17.1 % — ABNORMAL HIGH (ref 11.0–14.6)
nRBC: 0 % (ref 0–0)

## 2011-12-02 MED ORDER — PALONOSETRON HCL INJECTION 0.25 MG/5ML
0.2500 mg | Freq: Once | INTRAVENOUS | Status: AC
  Administered 2011-12-02: 0.25 mg via INTRAVENOUS

## 2011-12-02 MED ORDER — OXALIPLATIN CHEMO INJECTION 100 MG/20ML
126.0000 mg/m2 | Freq: Once | INTRAVENOUS | Status: AC
  Administered 2011-12-02: 260 mg via INTRAVENOUS
  Filled 2011-12-02: qty 52

## 2011-12-02 MED ORDER — DEXAMETHASONE SODIUM PHOSPHATE 10 MG/ML IJ SOLN
10.0000 mg | Freq: Once | INTRAMUSCULAR | Status: AC
  Administered 2011-12-02: 10 mg via INTRAVENOUS

## 2011-12-02 NOTE — Progress Notes (Signed)
OFFICE PROGRESS NOTE  Interval history:  Mr. Stoffers returns as scheduled. He completed cycle 1 CAPOX beginning 11/13/2011. He developed nausea on day 2. He vomited 1 time. He did not have nausea medication at home. The nausea had resolved by the time he picked up Compazine from his pharmacy. He denies mouth sores. No diarrhea. No hand or foot pain or redness. He initially reported that he had cold sensitivity in his "throat and hands" for 2 weeks. He subsequently stated that the cold sensitivity was minor. No persistent neuropathy symptoms.   Objective: Blood pressure 167/90, pulse 89, temperature 98.8 F (37.1 C), temperature source Oral, height 5' 7.5" (1.715 m), weight 192 lb 9.6 oz (87.363 kg).  Oropharynx is without thrush or ulceration. Lungs are clear. Regular cardiac rhythm. Port-A-Cath site covered with EMLA cream. Abdomen soft and nontender. No hepatomegaly. Left abdominal colostomy. Extremities without edema.  Lab Results: Lab Results  Component Value Date   WBC 4.8 12/02/2011   HGB 14.0 12/02/2011   HCT 41.1 12/02/2011   MCV 85.1 12/02/2011   PLT 310 12/02/2011    Chemistry:    Chemistry      Component Value Date/Time   NA 138 11/13/2011 1250   K 3.4* 11/13/2011 1250   CL 100 11/13/2011 1250   CO2 27 11/13/2011 1250   BUN 8 11/13/2011 1250   CREATININE 0.87 11/13/2011 1250      Component Value Date/Time   CALCIUM 9.0 11/13/2011 1250   ALKPHOS 59 11/13/2011 1250   AST 11 11/13/2011 1250   ALT 9 11/13/2011 1250   BILITOT 0.4 11/13/2011 1250       Studies/Results: Dg Chest Port 1 View  11/10/2011  *RADIOLOGY REPORT*  Clinical Data: Cord insertion.  PORTABLE CHEST - 1 VIEW  Comparison: None.  Findings: Left port is in place.  The tip is in the SVC.  No pneumothorax.  Right basilar atelectasis or infiltrate.  Heart is normal size.  No confluent opacity on the left.  No effusions.  IMPRESSION:  Left port tip in the SVC without pneumothorax.  Right basilar atelectasis or infiltrate.   Original Report Authenticated By: Cyndie Chime, M.D.   Dg Fluoro Guide Cv Line-no Report  11/10/2011  CLINICAL DATA: port-a-cath   FLOURO GUIDE CV LINE  Fluoroscopy was utilized by the requesting physician.  No radiographic  interpretation.      Medications: I have reviewed the patient's current medications.  Assessment/Plan:  1. 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 He completed cycle 1 CAPOX beginning 11/13/2011.  2. Delayed nausea-we will adjust the premedication regimen to include Aloxi.  Disposition-Mr. Brilliant appears stable. Plan to proceed with cycle 2 CAPOX today as scheduled. He will return for a followup visit and cycle 3 in 3 weeks. He will contact the office the interim with any problems.  Plan reviewed with Dr. Truett Perna.   Lonna Cobb ANP/GNP-BC

## 2011-12-02 NOTE — Assessment & Plan Note (Signed)
Aaron Roth is a 66 year old male patient of Dr. Kalman Drape diagnosed with stage III colon cancer.  History includes GERD, colostomy and a left colectomy.  MEDICATIONS INCLUDE:  Xeloda, vitamin D, Ativan, multivitamin, Zofran, Compazine and Aloxi.  LABS:  Potassium of 3.4 and glucose of 100.  HEIGHT:  67-1/2 inches. WEIGHT:  192.6 pounds. USUAL BODY WEIGHT:  206 pounds documented in November of 2012. BMI:  29.7.  I spoke with Aaron Roth and his wife in chemotherapy.  The patient denies nutritional side effects.  He does admit to a 1 time episode of nausea and vomiting after his last chemotherapy, however it was short-lived and resolved on its own without medication.  Patient has done some research on nutrition and appears to be eating a high-protein primarily plant based diet.  He understands he should eat small amounts more often.  He has a good understanding of strategies for dealing with nausea as far as his food intake goes.  His wife is interested in information on recipes for healthy cooking.  NUTRITION DIAGNOSIS:  Unintentional weight loss related to diagnosis of colon cancer and associated treatments as evidenced by 7% weight loss from usual body weight.  INTERVENTION:  I have reinforced healthy eating in small amounts throughout the day to include a protein source at every meal and snack. I reviewed strategies for dealing with nausea and encouraged the patient to take nausea medication as needed to prevent nausea.  I have encouraged bland foods when needed and increased fluids.  I have briefly educated on strategies to avoid constipation.  I have provided patient's wife with information on where she can find healthy recipes.  I have given them my contact information for further questions or concerns.   MONITORING/EVALUATION (GOALS):  The patient will tolerate healthy plant based diet with adequate protein to minimize any further weight loss during treatment.  NEXT VISIT:  Wednesday, February  20th during his chemotherapy.    ______________________________ Zenovia Jarred, RD, LDN Clinical Nutrition Specialist BN/MEDQ  D:  12/02/2011  T:  12/02/2011  Job:  [id number]

## 2011-12-04 ENCOUNTER — Ambulatory Visit: Payer: Medicare Other

## 2011-12-04 ENCOUNTER — Ambulatory Visit: Payer: Medicare Other | Admitting: Oncology

## 2011-12-04 ENCOUNTER — Other Ambulatory Visit: Payer: Medicare Other | Admitting: Lab

## 2011-12-16 ENCOUNTER — Telehealth: Payer: Self-pay | Admitting: *Deleted

## 2011-12-16 ENCOUNTER — Other Ambulatory Visit: Payer: Self-pay

## 2011-12-16 NOTE — Telephone Encounter (Signed)
Patient returned call from our office. No message. Called back and gave him phone # for Mendel Ryder to call in regards to his Xeloda refill with case #[id number]. Stated in message that medication will not be shipped unless he calls.

## 2011-12-16 NOTE — Telephone Encounter (Signed)
Mendel Ryder called reporting patient's home number is a non-working number.  Asked that we have patient call them at [phone] with reference case number [id number] in reference to his xeloda refill.  Gave [employer] patient's mobile number and they will try this.  This nurse called patient's home identified as "[facility]" and left this information on voicemail.  Unable to leave message on mobile number.  Will notify providers.  Next scheduled f/u is 12-23-2011.

## 2011-12-16 NOTE — Telephone Encounter (Signed)
Spoke with pt re: previous note by Lamonte Sakai, Charity fundraiser.  Pt reports "I have given them my cell number multiple times, because I'm staying with my son right now."  Provided pt with [employer]'s # and reference #.  Aaron Roth reports he will contact them for refill delivery. dph

## 2011-12-20 ENCOUNTER — Other Ambulatory Visit: Payer: Self-pay | Admitting: Oncology

## 2011-12-23 ENCOUNTER — Other Ambulatory Visit: Payer: Self-pay | Admitting: *Deleted

## 2011-12-23 ENCOUNTER — Ambulatory Visit: Payer: Medicare Other | Admitting: Nutrition

## 2011-12-23 ENCOUNTER — Ambulatory Visit (HOSPITAL_BASED_OUTPATIENT_CLINIC_OR_DEPARTMENT_OTHER): Payer: Medicare Other

## 2011-12-23 ENCOUNTER — Other Ambulatory Visit: Payer: Self-pay | Admitting: Lab

## 2011-12-23 ENCOUNTER — Ambulatory Visit: Payer: Self-pay | Admitting: Oncology

## 2011-12-23 VITALS — BP 181/100 | HR 91 | Temp 97.0°F | Ht 67.5 in | Wt 197.0 lb

## 2011-12-23 DIAGNOSIS — R110 Nausea: Secondary | ICD-10-CM

## 2011-12-23 DIAGNOSIS — Z5111 Encounter for antineoplastic chemotherapy: Secondary | ICD-10-CM

## 2011-12-23 DIAGNOSIS — Z85038 Personal history of other malignant neoplasm of large intestine: Secondary | ICD-10-CM

## 2011-12-23 DIAGNOSIS — C189 Malignant neoplasm of colon, unspecified: Secondary | ICD-10-CM

## 2011-12-23 LAB — CBC WITH DIFFERENTIAL/PLATELET
Basophils Absolute: 0 10*3/uL (ref 0.0–0.1)
EOS%: 1.6 % (ref 0.0–7.0)
Eosinophils Absolute: 0.1 10*3/uL (ref 0.0–0.5)
HGB: 14.4 g/dL (ref 13.0–17.1)
MCH: 30.3 pg (ref 27.2–33.4)
NEUT#: 1.8 10*3/uL (ref 1.5–6.5)
RDW: 18.7 % — ABNORMAL HIGH (ref 11.0–14.6)
lymph#: 2.2 10*3/uL (ref 0.9–3.3)
nRBC: 0 % (ref 0–0)

## 2011-12-23 LAB — COMPREHENSIVE METABOLIC PANEL
ALT: 21 U/L (ref 0–53)
Albumin: 3.9 g/dL (ref 3.5–5.2)
Alkaline Phosphatase: 70 U/L (ref 39–117)
Glucose, Bld: 68 mg/dL — ABNORMAL LOW (ref 70–99)
Potassium: 3.6 mEq/L (ref 3.5–5.3)
Sodium: 138 mEq/L (ref 135–145)
Total Protein: 7.2 g/dL (ref 6.0–8.3)

## 2011-12-23 MED ORDER — SODIUM CHLORIDE 0.9 % IJ SOLN
10.0000 mL | INTRAMUSCULAR | Status: DC | PRN
  Administered 2011-12-23: 10 mL
  Filled 2011-12-23: qty 10

## 2011-12-23 MED ORDER — ONDANSETRON HCL 8 MG PO TABS: ORAL_TABLET | ORAL | Status: DC

## 2011-12-23 MED ORDER — DEXAMETHASONE SODIUM PHOSPHATE 10 MG/ML IJ SOLN
10.0000 mg | Freq: Once | INTRAMUSCULAR | Status: AC
  Administered 2011-12-23: 10 mg via INTRAVENOUS

## 2011-12-23 MED ORDER — PALONOSETRON HCL INJECTION 0.25 MG/5ML
0.2500 mg | Freq: Once | INTRAVENOUS | Status: AC
  Administered 2011-12-23: 0.25 mg via INTRAVENOUS

## 2011-12-23 MED ORDER — DEXAMETHASONE 4 MG PO TABS: ORAL_TABLET | ORAL | Status: DC

## 2011-12-23 MED ORDER — OXALIPLATIN CHEMO INJECTION 100 MG/20ML
126.0000 mg/m2 | Freq: Once | INTRAVENOUS | Status: AC
  Administered 2011-12-23: 260 mg via INTRAVENOUS
  Filled 2011-12-23: qty 52

## 2011-12-23 MED ORDER — HEPARIN SOD (PORK) LOCK FLUSH 100 UNIT/ML IV SOLN
500.0000 [IU] | Freq: Once | INTRAVENOUS | Status: AC | PRN
  Administered 2011-12-23: 500 [IU]
  Filled 2011-12-23: qty 5

## 2011-12-23 MED ORDER — DEXTROSE 5 % IV SOLN: Freq: Once | INTRAVENOUS | Status: DC

## 2011-12-23 NOTE — Progress Notes (Signed)
Aaron Roth reports he is doing well.  He has no nutritional concerns.  He is eating well.  Weight has increased to 197 pounds documented today from 192.6 pounds documented on January 30th.  He reports some nausea; however, he is not interested in taking nausea medication.  He prefers to deal with his nausea without medication.  He denies other nutritional issues.  NUTRITION DIAGNOSIS:  Unintentional weight loss has improved.  INTERVENTION:  I have encouraged Aaron Roth to monitor his side effects and take medications as needed.  He is to continue small amounts of food more often throughout the day with a goal of weight maintenance.  MONITORING, EVALUATION, AND GOALS:  The patient has tolerated his oral diet with adequate protein to minimize weight loss.  NEXT VISIT:  I will schedule a followup with the patient as needed.    ______________________________ Zenovia Jarred, RD, LDN Clinical Nutrition Specialist BN/MEDQ  D:  12/23/2011  T:  12/23/2011  Job:  [id number]

## 2011-12-23 NOTE — Progress Notes (Signed)
OFFICE PROGRESS NOTE   INTERVAL HISTORY:   He completed another cycle of chemotherapy beginning on 12/02/2011. He reports nausea on days 3 and 4 following chemotherapy. He did not take nausea medication. He had cold sensitivity for one week following chemotherapy. He denies neuropathy symptoms today. He has a good appetite.  Objective:  Vital signs in last 24 hours:  Blood pressure 181/100, pulse 91, temperature 97 F (36.1 C), temperature source Oral, height 5' 7.5" (1.715 m), weight 197 lb (89.359 kg).    HEENT: No thrush or ulcers Resp: Lungs clear bilaterally Cardio: Regular rate and rhythm GI: No hepatomegaly, no mass, nontender, left lower quadrant colostomy Vascular: No leg edema Neuro: The vibratory sense is intact at the fingertip bilaterally  Skin: Mild hyperpigmentation at the hands.   Portacath/PICC-without erythema  Lab Results:  Lab Results  Component Value Date   WBC 4.4 12/23/2011   HGB 14.4 12/23/2011   HCT 41.7 12/23/2011   MCV 87.6 12/23/2011   PLT 233 12/23/2011   ANC 1.8    Medications: I have reviewed the patient's current medications.  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 He completed cycle 1 CAPOX beginning 11/13/2011.  2. Delayed nausea-persistent despite the addition of Aloxi. We will add prophylactic Decadron to be taken on days 2 and 3.   Disposition:  He has completed 2 cycles of CAPOX chemotherapy. The plan is to proceed with cycle 3 today. The fourth cycle of adjuvant chemotherapy will be delayed for one week due to a conflict with Mr. Showers schedule.  He will return for an office visit and cycle 4 on 01/20/2012.   Lucile Shutters, MD  12/23/2011  5:16 PM

## 2011-12-23 NOTE — Progress Notes (Signed)
Met with pt and son to assess for needs and offer emotional support.  Pt requested information re: ostomy care and requested to meet with Doreatha Massed at [facility].  Appointment was made with [facility] for patient.  Information on smoking cessation classes was also given to pt and son per their request.  Encouraged pt to call if other needs arise.  Pt and son very appreciative.

## 2012-01-07 ENCOUNTER — Other Ambulatory Visit: Payer: Self-pay | Admitting: *Deleted

## 2012-01-07 DIAGNOSIS — C189 Malignant neoplasm of colon, unspecified: Secondary | ICD-10-CM

## 2012-01-07 MED ORDER — CAPECITABINE 500 MG PO TABS: 2000.0000 mg | ORAL_TABLET | Freq: Two times a day (BID) | ORAL | Status: DC

## 2012-01-17 ENCOUNTER — Other Ambulatory Visit: Payer: Self-pay | Admitting: Oncology

## 2012-01-20 ENCOUNTER — Other Ambulatory Visit: Payer: Medicare Other | Admitting: Lab

## 2012-01-20 ENCOUNTER — Ambulatory Visit: Payer: Medicare Other | Admitting: Oncology

## 2012-01-20 ENCOUNTER — Ambulatory Visit (INDEPENDENT_AMBULATORY_CARE_PROVIDER_SITE_OTHER): Payer: Self-pay | Admitting: Surgery

## 2012-01-20 ENCOUNTER — Telehealth: Payer: Self-pay | Admitting: *Deleted

## 2012-01-20 ENCOUNTER — Ambulatory Visit (HOSPITAL_BASED_OUTPATIENT_CLINIC_OR_DEPARTMENT_OTHER): Payer: Medicare Other

## 2012-01-20 ENCOUNTER — Encounter (INDEPENDENT_AMBULATORY_CARE_PROVIDER_SITE_OTHER): Payer: Self-pay | Admitting: Surgery

## 2012-01-20 VITALS — BP 123/77 | HR 80 | Temp 98.8°F | Resp 12 | Ht 69.5 in | Wt 196.6 lb

## 2012-01-20 VITALS — BP 167/84 | HR 90 | Temp 97.2°F

## 2012-01-20 DIAGNOSIS — Z85038 Personal history of other malignant neoplasm of large intestine: Secondary | ICD-10-CM

## 2012-01-20 DIAGNOSIS — C186 Malignant neoplasm of descending colon: Secondary | ICD-10-CM

## 2012-01-20 DIAGNOSIS — Z5111 Encounter for antineoplastic chemotherapy: Secondary | ICD-10-CM

## 2012-01-20 LAB — COMPREHENSIVE METABOLIC PANEL
ALT: 10 U/L (ref 0–53)
CO2: 28 mEq/L (ref 19–32)
Calcium: 9.1 mg/dL (ref 8.4–10.5)
Chloride: 103 mEq/L (ref 96–112)
Creatinine, Ser: 1.11 mg/dL (ref 0.50–1.35)
Total Protein: 7.2 g/dL (ref 6.0–8.3)

## 2012-01-20 LAB — CBC WITH DIFFERENTIAL/PLATELET
BASO%: 0.5 % (ref 0.0–2.0)
HCT: 41.2 % (ref 38.4–49.9)
HGB: 14.2 g/dL (ref 13.0–17.1)
MCHC: 34.5 g/dL (ref 32.0–36.0)
MONO#: 0.5 10*3/uL (ref 0.1–0.9)
NEUT#: 2.3 10*3/uL (ref 1.5–6.5)
NEUT%: 40 % (ref 39.0–75.0)
WBC: 5.7 10*3/uL (ref 4.0–10.3)
lymph#: 2.8 10*3/uL (ref 0.9–3.3)

## 2012-01-20 MED ORDER — SODIUM CHLORIDE 0.9 % IJ SOLN
10.0000 mL | INTRAMUSCULAR | Status: DC | PRN
  Administered 2012-01-20: 10 mL
  Filled 2012-01-20: qty 10

## 2012-01-20 MED ORDER — DEXTROSE 5 % IV SOLN
Freq: Once | INTRAVENOUS | Status: AC
  Administered 2012-01-20: 15:00:00 via INTRAVENOUS

## 2012-01-20 MED ORDER — DEXAMETHASONE SODIUM PHOSPHATE 10 MG/ML IJ SOLN
10.0000 mg | Freq: Once | INTRAMUSCULAR | Status: AC
  Administered 2012-01-20: 10 mg via INTRAVENOUS

## 2012-01-20 MED ORDER — OXALIPLATIN CHEMO INJECTION 100 MG/20ML
126.0000 mg/m2 | Freq: Once | INTRAVENOUS | Status: AC
  Administered 2012-01-20: 260 mg via INTRAVENOUS
  Filled 2012-01-20: qty 52

## 2012-01-20 MED ORDER — HEPARIN SOD (PORK) LOCK FLUSH 100 UNIT/ML IV SOLN
500.0000 [IU] | Freq: Once | INTRAVENOUS | Status: AC | PRN
  Administered 2012-01-20: 500 [IU]
  Filled 2012-01-20: qty 5

## 2012-01-20 MED ORDER — PALONOSETRON HCL INJECTION 0.25 MG/5ML
0.2500 mg | Freq: Once | INTRAVENOUS | Status: AC
  Administered 2012-01-20: 0.25 mg via INTRAVENOUS

## 2012-01-20 NOTE — Telephone Encounter (Signed)
Received message from Dr. Allene Pyo office, pt being seen in office and will be late. Pt will have lab and chemo. NP will see in treatment room.

## 2012-01-20 NOTE — Progress Notes (Signed)
[facility]  Ovidio Kin, MD,  FACS [address] [place]  [address] [place], [place]    [address] Phone:  [phone] FAX:  [phone]   Re:   Aaron Roth DOB:   Aug 01, 1946 MRN:   [id number]  ASSESSMENT AND PLAN: 1.  Splenic flexure colon cancer.  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  Path - 4.0 cm mod differentiated adenoca, 4/15 nodes positive, KRAS positive (T3, N2), CEA - 1.2  Sees Dr. Truett Perna who is treating him with chemotx (CAPOX) for 6 months.  Then we will readdress his disease and talk about reversing his colostomy.  He looks good today.  Most of his early postop problems have resolved.  I will see him back in 3 months.  2.  Smokes.  Has quit since surgery. 3.  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  HISTORY OF PRESENT ILLNESS: Chief Complaint  Patient presents with  . Follow-up   [name] is a 66 y.o. (DOB: 06-Mar-1946)  AA male who is a patient of [facility], MD, MD and comes to me today for follow up of an obstructing splenic flexure colon cancer.  This is a patient from [facility], who presented to the [facility] with an obstructing splenic flexure colon ca while I was on call over Thanksgiving holiday.  I did a resection and end ostomy on 09/25/2011.    His final path showed a T3, N2 colon ca.  He is seeing Dr. Leonard Schwartz.Sherrill.  Plan to treat him with chemotx (CAPOX) for 6 months.  Then re-evaluate for reversing his colostomy.  His wife is with him today.  PHYSICAL EXAM: BP 123/77  Pulse 80  Temp(Src) 98.8 F (37.1 C) (Temporal)  Resp 12  Ht 5' 9.5" (1.765 m)  Wt 196 lb 9.6 oz (89.177 kg)  BMI 28.62 kg/m2  General: AA male who is alert and generally healthy appearing.  HEENT: Normal. Pupils equal. Good dentition. Neck: Supple. No mass.  No thyroid mass.  Carotid pulse okay with no bruit. Lymph Nodes:  No supraclavicular or cervical nodes. Lungs: Clear to auscultation and symmetric breath  sounds. Heart:  RRR. No murmur or rub. Abdomen: Well healed mid line incision.  Ostomy in LUQ is functioning and doing well. He says the "bag is too short".  Extremities:  Good strength and ROM  in upper and lower extremities. Neurologic:  Grossly intact to motor and sensory function. Psychiatric: He is in good spirits today and looks much better than when I saw him in the hospital.   DATA REVIEWED: Reviewed Dr. Kalman Drape notes.  Ovidio Kin, MD, FACS Office:  [phone]

## 2012-01-20 NOTE — Progress Notes (Signed)
OFFICE PROGRESS NOTE  Interval history:  Aaron Roth returns as scheduled. He had less nausea with the most recent cycle of chemotherapy. He denies mouth sores. No diarrhea. He noted cold sensitivity lasting 7-8 days. He denies persistent neuropathy symptoms.  Objective: There were no vitals taken for this visit.  Oropharynx is without thrush or ulceration. Lungs are clear. Regular cardiac rhythm. Port-A-Cath site is without erythema. Abdomen is soft and nontender. Extremities are without edema.   Lab Results: Lab Results  Component Value Date   WBC 5.7 01/20/2012   HGB 14.2 01/20/2012   HCT 41.2 01/20/2012   MCV 88.4 01/20/2012   PLT 293 01/20/2012    Chemistry:    Chemistry      Component Value Date/Time   NA 138 01/20/2012 1339   K 4.1 01/20/2012 1339   CL 103 01/20/2012 1339   CO2 28 01/20/2012 1339   BUN 15 01/20/2012 1339   CREATININE 1.11 01/20/2012 1339      Component Value Date/Time   CALCIUM 9.1 01/20/2012 1339   ALKPHOS 75 01/20/2012 1339   AST 16 01/20/2012 1339   ALT 10 01/20/2012 1339   BILITOT 0.3 01/20/2012 1339       Studies/Results: No results found.    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 He completed cycle 1 CAPOX beginning 11/13/2011; cycle 2 beginning 12/02/2011; cycle 3 beginning 12/23/2011.  2. Delayed nausea-persistent despite the addition of Aloxi. Prophylactic decadron days 2 and 3 was added beginning with cycle 3 with improvement.   Disposition-plan to proceed with cycle 4 CAPOX today as scheduled. He will return for a followup visit and cycle 5 CAPOX in 3 weeks. He will contact the office in the interim with any problems.   Lonna Cobb ANP/GNP-BC

## 2012-01-21 ENCOUNTER — Telehealth: Payer: Self-pay | Admitting: Oncology

## 2012-01-21 ENCOUNTER — Telehealth: Payer: Self-pay | Admitting: *Deleted

## 2012-01-21 NOTE — Telephone Encounter (Signed)
pt rtn call and confirmed appts for april2013.  sent messsage to nurse to call pt with lab results for march

## 2012-01-21 NOTE — Telephone Encounter (Signed)
called pt lmovm with appts for april2013

## 2012-01-21 NOTE — Telephone Encounter (Signed)
Message from pt requesting lab results. Returned call, he would like to receive a copy of lab with each visit.

## 2012-01-27 ENCOUNTER — Telehealth: Payer: Self-pay | Admitting: *Deleted

## 2012-01-27 NOTE — Telephone Encounter (Signed)
Received phone call from Mr. Burgin.  He said he had diarrhea on Monday, but that it was related to the vegetables he ate on Sunday.  He states his bowels are back to normal.  Audelia Acton RN reviewed side effects to report such as diarrhea, fever, blood in stools, pain, or other complaints.  He verbalized understanding.  He was given resource phone numbers of ostomy nurses, as he has a question he would like to ask that he cannot seem to get the answer to  from other resource contacts.  Encouraged patient to call if he needs any further assistance or has questions.  He verbalized comprehension and was without complaints.

## 2012-01-29 ENCOUNTER — Other Ambulatory Visit: Payer: Self-pay | Admitting: *Deleted

## 2012-01-29 DIAGNOSIS — C189 Malignant neoplasm of colon, unspecified: Secondary | ICD-10-CM

## 2012-01-29 MED ORDER — CAPECITABINE 500 MG PO TABS: 2000.0000 mg | ORAL_TABLET | Freq: Two times a day (BID) | ORAL | Status: DC

## 2012-02-10 ENCOUNTER — Ambulatory Visit (HOSPITAL_BASED_OUTPATIENT_CLINIC_OR_DEPARTMENT_OTHER): Payer: Medicare Other

## 2012-02-10 ENCOUNTER — Telehealth: Payer: Self-pay | Admitting: Oncology

## 2012-02-10 ENCOUNTER — Other Ambulatory Visit (HOSPITAL_BASED_OUTPATIENT_CLINIC_OR_DEPARTMENT_OTHER): Payer: Self-pay | Admitting: Lab

## 2012-02-10 ENCOUNTER — Ambulatory Visit (HOSPITAL_BASED_OUTPATIENT_CLINIC_OR_DEPARTMENT_OTHER): Payer: Medicare Other | Admitting: Nurse Practitioner

## 2012-02-10 VITALS — BP 161/97 | HR 91 | Temp 98.2°F | Ht 69.5 in | Wt 201.8 lb

## 2012-02-10 DIAGNOSIS — K6289 Other specified diseases of anus and rectum: Secondary | ICD-10-CM

## 2012-02-10 DIAGNOSIS — R197 Diarrhea, unspecified: Secondary | ICD-10-CM

## 2012-02-10 DIAGNOSIS — Z85038 Personal history of other malignant neoplasm of large intestine: Secondary | ICD-10-CM

## 2012-02-10 DIAGNOSIS — Z5111 Encounter for antineoplastic chemotherapy: Secondary | ICD-10-CM

## 2012-02-10 DIAGNOSIS — C186 Malignant neoplasm of descending colon: Secondary | ICD-10-CM

## 2012-02-10 DIAGNOSIS — C189 Malignant neoplasm of colon, unspecified: Secondary | ICD-10-CM

## 2012-02-10 DIAGNOSIS — R110 Nausea: Secondary | ICD-10-CM

## 2012-02-10 LAB — COMPREHENSIVE METABOLIC PANEL
Albumin: 3.7 g/dL (ref 3.5–5.2)
BUN: 13 mg/dL (ref 6–23)
CO2: 29 mEq/L (ref 19–32)
Calcium: 9.3 mg/dL (ref 8.4–10.5)
Chloride: 102 mEq/L (ref 96–112)
Glucose, Bld: 94 mg/dL (ref 70–99)
Potassium: 3.7 mEq/L (ref 3.5–5.3)
Sodium: 137 mEq/L (ref 135–145)
Total Protein: 7.2 g/dL (ref 6.0–8.3)

## 2012-02-10 LAB — CBC WITH DIFFERENTIAL/PLATELET
Basophils Absolute: 0 10*3/uL (ref 0.0–0.1)
Eosinophils Absolute: 0.1 10*3/uL (ref 0.0–0.5)
HCT: 42.6 % (ref 38.4–49.9)
HGB: 14.7 g/dL (ref 13.0–17.1)
MCH: 30.8 pg (ref 27.2–33.4)
NEUT#: 1.7 10*3/uL (ref 1.5–6.5)
NEUT%: 37.5 % — ABNORMAL LOW (ref 39.0–75.0)
RDW: 16.6 % — ABNORMAL HIGH (ref 11.0–14.6)
lymph#: 2.3 10*3/uL (ref 0.9–3.3)

## 2012-02-10 MED ORDER — DEXTROSE 5 % IV SOLN
Freq: Once | INTRAVENOUS | Status: AC
  Administered 2012-02-10: 11:00:00 via INTRAVENOUS

## 2012-02-10 MED ORDER — PALONOSETRON HCL INJECTION 0.25 MG/5ML: 0.2500 mg | Freq: Once | INTRAVENOUS | Status: DC

## 2012-02-10 MED ORDER — SODIUM CHLORIDE 0.9 % IJ SOLN
10.0000 mL | INTRAMUSCULAR | Status: DC | PRN
  Administered 2012-02-10: 10 mL
  Filled 2012-02-10: qty 10

## 2012-02-10 MED ORDER — HEPARIN SOD (PORK) LOCK FLUSH 100 UNIT/ML IV SOLN
500.0000 [IU] | Freq: Once | INTRAVENOUS | Status: AC | PRN
  Administered 2012-02-10: 500 [IU]
  Filled 2012-02-10: qty 5

## 2012-02-10 MED ORDER — OXALIPLATIN CHEMO INJECTION 100 MG/20ML
126.0000 mg/m2 | Freq: Once | INTRAVENOUS | Status: AC
  Administered 2012-02-10: 260 mg via INTRAVENOUS
  Filled 2012-02-10: qty 52

## 2012-02-10 MED ORDER — DEXAMETHASONE SODIUM PHOSPHATE 10 MG/ML IJ SOLN
10.0000 mg | Freq: Once | INTRAMUSCULAR | Status: AC
  Administered 2012-02-10: 10 mg via INTRAVENOUS

## 2012-02-10 NOTE — Progress Notes (Signed)
OFFICE PROGRESS NOTE  Interval history:  Mr. Fancher returns as scheduled. He completed cycle 4 CAPOX beginning 01/20/2012. He denies nausea/vomiting. No mouth sores. No hand or foot pain or redness. He continues to note hyperpigmentation of the skin on his hands. Cold sensitivity lasted approximately 4-5 days. He denies persistent neuropathy symptoms. He developed diarrhea during week 2 lasting for 3 days. He has noted an increase in mucousy discharge from the rectum recently.   Objective: Blood pressure 161/97, pulse 91, temperature 98.2 F (36.8 C), temperature source Oral, height 5' 9.5" (1.765 m), weight 201 lb 12.8 oz (91.536 kg).  Oropharynx is without thrush or ulceration. Lungs are clear. No wheezes or rales. Regular cardiac rhythm. Port-A-Cath site is without erythema. Abdomen is soft and nontender. No hepatomegaly. Trace bilateral pretibial edema. Calves are soft and nontender. Vibratory sense is intact over the fingertips per tuning fork exam.  Lab Results: Lab Results  Component Value Date   WBC 4.6 02/10/2012   HGB 14.7 02/10/2012   HCT 42.6 02/10/2012   MCV 89.1 02/10/2012   PLT 238 02/10/2012    Chemistry:    Chemistry      Component Value Date/Time   NA 138 01/20/2012 1339   K 4.1 01/20/2012 1339   CL 103 01/20/2012 1339   CO2 28 01/20/2012 1339   BUN 15 01/20/2012 1339   CREATININE 1.11 01/20/2012 1339      Component Value Date/Time   CALCIUM 9.1 01/20/2012 1339   ALKPHOS 75 01/20/2012 1339   AST 16 01/20/2012 1339   ALT 10 01/20/2012 1339   BILITOT 0.3 01/20/2012 1339       Studies/Results: No results found.  Medications: I have reviewed the patient's current medications.  Assessment/Plan:  1. 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 He completed cycle 1 CAPOX beginning 11/13/2011, cycle 2 beginning 12/02/2011, cycle 3 beginning 12/25/2011 and cycle  4 beginning 01/20/2012. 2. Delayed nausea-persistent despite the addition of Aloxi. Prophylactic Decadron was added beginning with cycle 3. He had no nausea following cycle 4.  Disposition-Aaron Roth appears stable. Plan to proceed with cycle 5 CAPOX today as scheduled. He will return for a followup visit and cycle 6 in 3 weeks. He will contact the office in the interim with any problems.  Plan reviewed Dr. Truett Perna.  Lonna Cobb ANP/GNP-BC

## 2012-02-10 NOTE — Patient Instructions (Signed)
[facility] Discharge Instructions for Patients Receiving Chemotherapy  Today you received the following chemotherapy agents oxaliplatin  To help prevent nausea and vomiting after your treatment, we encourage you to take your nausea medicatio and take it as often as prescribed for the next    If you develop nausea and vomiting that is not controlled by your nausea medication, call the clinic. If it is after clinic hours your family physician or the after hours number for the clinic or go to the Emergency Department.   BELOW ARE SYMPTOMS THAT SHOULD BE REPORTED IMMEDIATELY:  *FEVER GREATER THAN 100.5 F  *CHILLS WITH OR WITHOUT FEVER  NAUSEA AND VOMITING THAT IS NOT CONTROLLED WITH YOUR NAUSEA MEDICATION  *UNUSUAL SHORTNESS OF BREATH  *UNUSUAL BRUISING OR BLEEDING  TENDERNESS IN MOUTH AND THROAT WITH OR WITHOUT PRESENCE OF ULCERS  *URINARY PROBLEMS  *BOWEL PROBLEMS  UNUSUAL RASH Items with * indicate a potential emergency and should be followed up as soon as possible.  One of the nurses will contact you 24 hours after your treatment. Please let the nurse know about any problems that you may have experienced. Feel free to call the clinic you have any questions or concerns. The clinic phone number is [phone].   I have been informed and understand all the instructions given to me. I know to contact the clinic, my physician, or go to the Emergency Department if any problems should occur. I do not have any questions at this time, but understand that I may call the clinic during office hours   should I have any questions or need assistance in obtaining follow up care.    __________________________________________  _____________  __________ Signature of Patient or Authorized Representative            Date                   Time    __________________________________________ Nurse's Signature

## 2012-02-10 NOTE — Telephone Encounter (Signed)
Gv pt appt for may2013 

## 2012-03-01 ENCOUNTER — Other Ambulatory Visit: Payer: Self-pay | Admitting: Oncology

## 2012-03-01 ENCOUNTER — Other Ambulatory Visit: Payer: Self-pay | Admitting: *Deleted

## 2012-03-01 DIAGNOSIS — C189 Malignant neoplasm of colon, unspecified: Secondary | ICD-10-CM

## 2012-03-01 MED ORDER — CAPECITABINE 500 MG PO TABS: 2000.0000 mg | ORAL_TABLET | Freq: Two times a day (BID) | ORAL | Status: DC

## 2012-03-02 ENCOUNTER — Telehealth: Payer: Self-pay | Admitting: *Deleted

## 2012-03-02 ENCOUNTER — Ambulatory Visit (HOSPITAL_BASED_OUTPATIENT_CLINIC_OR_DEPARTMENT_OTHER): Payer: Medicare Other

## 2012-03-02 ENCOUNTER — Other Ambulatory Visit: Payer: Medicare Other | Admitting: Lab

## 2012-03-02 ENCOUNTER — Ambulatory Visit: Payer: Medicare Other | Admitting: Oncology

## 2012-03-02 VITALS — BP 166/97 | HR 86 | Temp 98.4°F | Ht 69.5 in | Wt 202.3 lb

## 2012-03-02 DIAGNOSIS — C186 Malignant neoplasm of descending colon: Secondary | ICD-10-CM

## 2012-03-02 DIAGNOSIS — Z85038 Personal history of other malignant neoplasm of large intestine: Secondary | ICD-10-CM

## 2012-03-02 DIAGNOSIS — Z5111 Encounter for antineoplastic chemotherapy: Secondary | ICD-10-CM

## 2012-03-02 LAB — CBC WITH DIFFERENTIAL/PLATELET
Basophils Absolute: 0 10*3/uL (ref 0.0–0.1)
EOS%: 1.3 % (ref 0.0–7.0)
HGB: 14.9 g/dL (ref 13.0–17.1)
LYMPH%: 49.2 % — ABNORMAL HIGH (ref 14.0–49.0)
MCH: 30.7 pg (ref 27.2–33.4)
MCV: 88.2 fL (ref 79.3–98.0)
MONO%: 8.5 % (ref 0.0–14.0)
NEUT%: 40.6 % (ref 39.0–75.0)
Platelets: 227 10*3/uL (ref 140–400)
RDW: 15.8 % — ABNORMAL HIGH (ref 11.0–14.6)

## 2012-03-02 LAB — COMPREHENSIVE METABOLIC PANEL
Alkaline Phosphatase: 86 U/L (ref 39–117)
BUN: 16 mg/dL (ref 6–23)
Creatinine, Ser: 1.02 mg/dL (ref 0.50–1.35)
Glucose, Bld: 92 mg/dL (ref 70–99)
Sodium: 138 mEq/L (ref 135–145)
Total Bilirubin: 0.2 mg/dL — ABNORMAL LOW (ref 0.3–1.2)

## 2012-03-02 MED ORDER — DEXAMETHASONE SODIUM PHOSPHATE 10 MG/ML IJ SOLN
10.0000 mg | Freq: Once | INTRAMUSCULAR | Status: AC
  Administered 2012-03-02: 10 mg via INTRAVENOUS

## 2012-03-02 MED ORDER — DEXTROSE 5 % IV SOLN
Freq: Once | INTRAVENOUS | Status: AC
  Administered 2012-03-02: 12:00:00 via INTRAVENOUS

## 2012-03-02 MED ORDER — OXALIPLATIN CHEMO INJECTION 100 MG/20ML
126.0000 mg/m2 | Freq: Once | INTRAVENOUS | Status: AC
  Administered 2012-03-02: 260 mg via INTRAVENOUS
  Filled 2012-03-02: qty 52

## 2012-03-02 MED ORDER — SODIUM CHLORIDE 0.9 % IJ SOLN
10.0000 mL | INTRAMUSCULAR | Status: DC | PRN
  Administered 2012-03-02: 10 mL
  Filled 2012-03-02: qty 10

## 2012-03-02 MED ORDER — HEPARIN SOD (PORK) LOCK FLUSH 100 UNIT/ML IV SOLN
500.0000 [IU] | Freq: Once | INTRAVENOUS | Status: AC | PRN
  Administered 2012-03-02: 500 [IU]
  Filled 2012-03-02: qty 5

## 2012-03-02 MED ORDER — PALONOSETRON HCL INJECTION 0.25 MG/5ML
0.2500 mg | Freq: Once | INTRAVENOUS | Status: AC
  Administered 2012-03-02: 0.25 mg via INTRAVENOUS

## 2012-03-02 NOTE — Patient Instructions (Signed)
[facility] Discharge Instructions for Patients Receiving Chemotherapy  Today you received the following chemotherapy agents Oxaliplatin  To help prevent nausea and vomiting after your treatment, we encourage you to take your nausea medication as prescribed.   If you develop nausea and vomiting that is not controlled by your nausea medication, call the clinic. If it is after clinic hours your family physician or the after hours number for the clinic or go to the Emergency Department.   BELOW ARE SYMPTOMS THAT SHOULD BE REPORTED IMMEDIATELY:  *FEVER GREATER THAN 100.5 F  *CHILLS WITH OR WITHOUT FEVER  NAUSEA AND VOMITING THAT IS NOT CONTROLLED WITH YOUR NAUSEA MEDICATION  *UNUSUAL SHORTNESS OF BREATH  *UNUSUAL BRUISING OR BLEEDING  TENDERNESS IN MOUTH AND THROAT WITH OR WITHOUT PRESENCE OF ULCERS  *URINARY PROBLEMS  *BOWEL PROBLEMS  UNUSUAL RASH Items with * indicate a potential emergency and should be followed up as soon as possible.  One of the nurses will contact you 24 hours after your treatment. Please let the nurse know about any problems that you may have experienced. Feel free to call the clinic you have any questions or concerns. The clinic phone number is [phone].   I have been informed and understand all the instructions given to me. I know to contact the clinic, my physician, or go to the Emergency Department if any problems should occur. I do not have any questions at this time, but understand that I may call the clinic during office hours   should I have any questions or need assistance in obtaining follow up care.    __________________________________________  _____________  __________ Signature of Patient or Authorized Representative            Date                   Time    __________________________________________ Nurse's Signature    

## 2012-03-02 NOTE — Progress Notes (Signed)
OFFICE PROGRESS NOTE   INTERVAL HISTORY:   He returns as scheduled. He completed another cycle of chemotherapy beginning on 02/10/2012. He reports tolerating the chemotherapy well. He denies nausea, mouth sores, and diarrhea. He has mild cold sensitivity lasting for approximately 1 week following the oxaliplatin. This has resolved. He reports mild numbness and tingling in the fingers and toes. This predated chemotherapy and there has been no change. No difficulty with activities such as buttoning his shirt, tying his shoes, or holding a cup.  Objective:  Vital signs in last 24 hours:  Blood pressure 166/97, pulse 86, temperature 98.4 F (36.9 C), temperature source Oral, height 5' 9.5" (1.765 m), weight 202 lb 4.8 oz (91.763 kg).    HEENT: No thrush or ulcers Resp: Lungs clear bilaterally Cardio: Regular rate and rhythm GI: No hepatomegaly, formed stool in the left lower quadrant ostomy Vascular: Trace low pretibial edema bilaterally Neuro: The vibratory sense is intact at the fingertips bilaterally  Skin: Mild hyperpigmentation and skin thickening at the hands, no erythema   Portacath/PICC-without erythema  Lab Results:  Lab Results  Component Value Date   WBC 5.3 03/02/2012   HGB 14.9 03/02/2012   HCT 42.8 03/02/2012   MCV 88.2 03/02/2012   PLT 227 03/02/2012   ANC 2.1    Medications: I have reviewed the patient's current medications.  Assessment/Plan: 1. 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 He completed cycle 1 CAPOX beginning 11/13/2011, cycle 5 beginning 02/10/2012.  2. Delayed nausea-persistent despite the addition of Aloxi. Prophylactic Decadron was added beginning with cycle 3. He had no nausea following cycle 5.  Disposition:  He continues to tolerate chemotherapy well. No evidence of oxaliplatin-related neuropathy. The plan is to proceed with cycle 6  today. He will return for an office visit and cycle 7 in 3 weeks.   Thornton Papas, MD  03/02/2012  12:43 PM

## 2012-03-02 NOTE — Telephone Encounter (Signed)
Per staff message from [place], I have scheduled treatment appts for the patient. Appts in computer and [place] aware.  JMW

## 2012-03-02 NOTE — Progress Notes (Signed)
1430 Pt c/o of itching around waist line states "I think its the chair"  Offered to call MD and pt declined stated "Don't want that, I will let you know if it gets any worse"  No rash noted to lower back or around waist.    1520 Pt states "itching has decreased, don't think it was from the medicine"

## 2012-03-04 ENCOUNTER — Telehealth: Payer: Self-pay | Admitting: Oncology

## 2012-03-04 NOTE — Telephone Encounter (Signed)
called pts son and provided appt for 05/22 and 06/16

## 2012-03-08 ENCOUNTER — Telehealth: Payer: Self-pay

## 2012-03-08 ENCOUNTER — Encounter (INDEPENDENT_AMBULATORY_CARE_PROVIDER_SITE_OTHER): Payer: Self-pay | Admitting: Surgery

## 2012-03-08 NOTE — Telephone Encounter (Signed)
Received fax script from pt's PCP office, [facility] requesting labs be added to pt's next lab appt here (5/22) and be faxed to their office - [phone]  CMET, Thyroid Panel with TSH, CBC with diff, iron, lipid profile & hgbA1C.    Fax given to lab staff. dph

## 2012-03-15 ENCOUNTER — Encounter: Payer: Self-pay | Admitting: *Deleted

## 2012-03-16 ENCOUNTER — Encounter: Payer: Self-pay | Admitting: *Deleted

## 2012-03-22 ENCOUNTER — Other Ambulatory Visit: Payer: Self-pay | Admitting: Oncology

## 2012-03-23 ENCOUNTER — Other Ambulatory Visit (HOSPITAL_BASED_OUTPATIENT_CLINIC_OR_DEPARTMENT_OTHER): Payer: Medicare Other | Admitting: Lab

## 2012-03-23 ENCOUNTER — Ambulatory Visit (HOSPITAL_BASED_OUTPATIENT_CLINIC_OR_DEPARTMENT_OTHER): Payer: Medicare Other | Admitting: Oncology

## 2012-03-23 ENCOUNTER — Ambulatory Visit (HOSPITAL_BASED_OUTPATIENT_CLINIC_OR_DEPARTMENT_OTHER): Payer: Medicare Other

## 2012-03-23 ENCOUNTER — Telehealth: Payer: Self-pay | Admitting: Oncology

## 2012-03-23 VITALS — BP 192/94 | HR 77 | Temp 98.0°F | Ht 69.5 in | Wt 200.5 lb

## 2012-03-23 DIAGNOSIS — Z85038 Personal history of other malignant neoplasm of large intestine: Secondary | ICD-10-CM

## 2012-03-23 DIAGNOSIS — Z5111 Encounter for antineoplastic chemotherapy: Secondary | ICD-10-CM

## 2012-03-23 DIAGNOSIS — C186 Malignant neoplasm of descending colon: Secondary | ICD-10-CM

## 2012-03-23 LAB — COMPREHENSIVE METABOLIC PANEL
AST: 19 U/L (ref 0–37)
Albumin: 3.7 g/dL (ref 3.5–5.2)
Alkaline Phosphatase: 92 U/L (ref 39–117)
BUN: 14 mg/dL (ref 6–23)
Creatinine, Ser: 0.96 mg/dL (ref 0.50–1.35)
Glucose, Bld: 95 mg/dL (ref 70–99)
Potassium: 3.8 mEq/L (ref 3.5–5.3)

## 2012-03-23 LAB — CBC WITH DIFFERENTIAL/PLATELET
Basophils Absolute: 0 10*3/uL (ref 0.0–0.1)
EOS%: 1 % (ref 0.0–7.0)
Eosinophils Absolute: 0.1 10*3/uL (ref 0.0–0.5)
HCT: 40.7 % (ref 38.4–49.9)
HGB: 13.9 g/dL (ref 13.0–17.1)
LYMPH%: 46.8 % (ref 14.0–49.0)
MCH: 30.2 pg (ref 27.2–33.4)
MCV: 88.3 fL (ref 79.3–98.0)
MONO%: 12.5 % (ref 0.0–14.0)
NEUT#: 2 10*3/uL (ref 1.5–6.5)
NEUT%: 38.9 % — ABNORMAL LOW (ref 39.0–75.0)
Platelets: 247 10*3/uL (ref 140–400)
RDW: 15.1 % — ABNORMAL HIGH (ref 11.0–14.6)

## 2012-03-23 MED ORDER — HEPARIN SOD (PORK) LOCK FLUSH 100 UNIT/ML IV SOLN
500.0000 [IU] | Freq: Once | INTRAVENOUS | Status: AC | PRN
  Administered 2012-03-23: 500 [IU]
  Filled 2012-03-23: qty 5

## 2012-03-23 MED ORDER — DEXTROSE 5 % IV SOLN: Freq: Once | INTRAVENOUS | Status: DC

## 2012-03-23 MED ORDER — OXALIPLATIN CHEMO INJECTION 100 MG/20ML
126.0000 mg/m2 | Freq: Once | INTRAVENOUS | Status: AC
  Administered 2012-03-23: 260 mg via INTRAVENOUS
  Filled 2012-03-23: qty 52

## 2012-03-23 MED ORDER — SODIUM CHLORIDE 0.9 % IJ SOLN
10.0000 mL | INTRAMUSCULAR | Status: DC | PRN
  Administered 2012-03-23: 10 mL
  Filled 2012-03-23: qty 10

## 2012-03-23 MED ORDER — FAMOTIDINE IN NACL 20-0.9 MG/50ML-% IV SOLN
20.0000 mg | Freq: Once | INTRAVENOUS | Status: AC
  Administered 2012-03-23: 20 mg via INTRAVENOUS

## 2012-03-23 MED ORDER — DIPHENHYDRAMINE HCL 50 MG/ML IJ SOLN
25.0000 mg | Freq: Once | INTRAMUSCULAR | Status: AC
  Administered 2012-03-23: 13:00:00 via INTRAVENOUS

## 2012-03-23 MED ORDER — METHYLPREDNISOLONE SODIUM SUCC 125 MG IJ SOLR
125.0000 mg | Freq: Once | INTRAMUSCULAR | Status: AC
  Administered 2012-03-23: 125 mg via INTRAVENOUS

## 2012-03-23 MED ORDER — PALONOSETRON HCL INJECTION 0.25 MG/5ML
0.2500 mg | Freq: Once | INTRAVENOUS | Status: AC
  Administered 2012-03-23: 0.25 mg via INTRAVENOUS

## 2012-03-23 NOTE — Progress Notes (Signed)
   [facility]    OFFICE PROGRESS NOTE   INTERVAL HISTORY:   He returns as scheduled. He completed cycle 6 of adjuvant CAPOX chemotherapy on may first 2013. He reports tolerating the chemotherapy well. No nausea or mouth sores. Cold sensitivity lasting 3-4 days. He has mild intermittent tingling at the fingers and toes. This has not changed significantly since beginning chemotherapy. No difficulty with activity such as tying his shoes or buttoning his shirt. He had one episode of emesis on the day of chemotherapy. He reports feeling hot and developing erythema around the waist, chest, and neck during the oxaliplatin infusion. There was associated pruritus. No other symptoms. The rash spontaneously resolved.  He reports an episode of diarrhea after drinking milk and eating milk products. No other diarrhea.  Objective:  Vital signs in last 24 hours:  Blood pressure 192/94, pulse 77, temperature 98 F (36.7 C), temperature source Oral, height 5' 9.5" (1.765 m), weight 200 lb 8 oz (90.946 kg).    HEENT: No thrush or ulcers Resp: Lungs clear bilaterally Cardio: Regular rate and rhythm GI: No hepatomegaly, nontender, left lower quadrant colostomy Vascular: No leg edema Neuro: The vibratory sense is intact at the Skin: Mild hyperpigmentation and skin thickening at the hands, no erythema or skin breakdown   Portacath/PICC-without erythema  Lab Results:  Lab Results  Component Value Date   WBC 5.0 03/23/2012   HGB 13.9 03/23/2012   HCT 40.7 03/23/2012   MCV 88.3 03/23/2012   PLT 247 03/23/2012   ANC 2.0   Medications: I have reviewed the patient's current medications.  Assessment/Plan: 1. 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 He completed cycle 1 CAPOX beginning 11/13/2011, cycle  beginning 03/02/2012. 2. Delayed nausea-persistent despite the  addition of Aloxi. Prophylactic Decadron was added beginning with cycle 3. He had no nausea following cycle 6. 3. "Rash" and pruritus during the oxaliplatin infusion with cycle 6-potentially an allergic reaction to the oxaliplatin. He will be premedicated with Benadryl, Pepcid, and Solu-Medrol today. The oxaliplatin will be started at a slow rate. He knows to alert the nurse for any symptoms of an allergic reaction.  Disposition:  He continues to tolerate the chemotherapy well. The plan is to proceed with cycle 7 of adjuvant chemotherapy today. He will return for an office visit and cycle 8 in 3 weeks.   Thornton Papas, MD  03/23/2012  5:52 PM

## 2012-03-23 NOTE — Patient Instructions (Signed)
[facility] Discharge Instructions for Patients Receiving Chemotherapy  Today you received the following chemotherapy agent Oxaliplatin.  To help prevent nausea and vomiting after your treatment, we encourage you to take your nausea medication. Begin taking it as often as prescribed for by Dr. Truett Perna.    If you develop nausea and vomiting that is not controlled by your nausea medication, call the clinic. If it is after clinic hours your family physician or the after hours number for the clinic or go to the Emergency Department.   BELOW ARE SYMPTOMS THAT SHOULD BE REPORTED IMMEDIATELY:  *FEVER GREATER THAN 100.5 F  *CHILLS WITH OR WITHOUT FEVER  NAUSEA AND VOMITING THAT IS NOT CONTROLLED WITH YOUR NAUSEA MEDICATION  *UNUSUAL SHORTNESS OF BREATH  *UNUSUAL BRUISING OR BLEEDING  TENDERNESS IN MOUTH AND THROAT WITH OR WITHOUT PRESENCE OF ULCERS  *URINARY PROBLEMS  *BOWEL PROBLEMS  UNUSUAL RASH Items with * indicate a potential emergency and should be followed up as soon as possible.  One of the nurses will contact you 24 hours after your treatment. Please let the nurse know about any problems that you may have experienced. Feel free to call the clinic you have any questions or concerns. The clinic phone number is [phone].   I have been informed and understand all the instructions given to me. I know to contact the clinic, my physician, or go to the Emergency Department if any problems should occur. I do not have any questions at this time, but understand that I may call the clinic during office hours   should I have any questions or need assistance in obtaining follow up care.    __________________________________________  _____________  __________ Signature of Patient or Authorized Representative            Date                   Time    __________________________________________ Nurse's Signature

## 2012-03-23 NOTE — Telephone Encounter (Signed)
Gave pt appt for June 2013 lab, chemo and MD

## 2012-04-12 ENCOUNTER — Other Ambulatory Visit: Payer: Self-pay | Admitting: Oncology

## 2012-04-13 ENCOUNTER — Telehealth: Payer: Self-pay | Admitting: Oncology

## 2012-04-13 ENCOUNTER — Ambulatory Visit: Payer: Medicare Other

## 2012-04-13 ENCOUNTER — Other Ambulatory Visit (HOSPITAL_BASED_OUTPATIENT_CLINIC_OR_DEPARTMENT_OTHER): Payer: Self-pay | Admitting: Lab

## 2012-04-13 ENCOUNTER — Ambulatory Visit (HOSPITAL_BASED_OUTPATIENT_CLINIC_OR_DEPARTMENT_OTHER): Payer: Self-pay | Admitting: Nurse Practitioner

## 2012-04-13 VITALS — BP 167/93 | HR 88 | Temp 99.0°F | Ht 69.5 in | Wt 201.3 lb

## 2012-04-13 DIAGNOSIS — Z85038 Personal history of other malignant neoplasm of large intestine: Secondary | ICD-10-CM

## 2012-04-13 DIAGNOSIS — G620 Drug-induced polyneuropathy: Secondary | ICD-10-CM

## 2012-04-13 DIAGNOSIS — T451X5A Adverse effect of antineoplastic and immunosuppressive drugs, initial encounter: Secondary | ICD-10-CM

## 2012-04-13 DIAGNOSIS — C186 Malignant neoplasm of descending colon: Secondary | ICD-10-CM

## 2012-04-13 DIAGNOSIS — C189 Malignant neoplasm of colon, unspecified: Secondary | ICD-10-CM

## 2012-04-13 DIAGNOSIS — R110 Nausea: Secondary | ICD-10-CM

## 2012-04-13 LAB — CBC WITH DIFFERENTIAL/PLATELET
Basophils Absolute: 0 10*3/uL (ref 0.0–0.1)
Eosinophils Absolute: 0.1 10*3/uL (ref 0.0–0.5)
HGB: 13.4 g/dL (ref 13.0–17.1)
LYMPH%: 43.4 % (ref 14.0–49.0)
MCV: 87.5 fL (ref 79.3–98.0)
MONO#: 0.5 10*3/uL (ref 0.1–0.9)
MONO%: 11.3 % (ref 0.0–14.0)
NEUT#: 2 10*3/uL (ref 1.5–6.5)
Platelets: 202 10*3/uL (ref 140–400)
RBC: 4.41 10*6/uL (ref 4.20–5.82)
WBC: 4.5 10*3/uL (ref 4.0–10.3)

## 2012-04-13 NOTE — Progress Notes (Signed)
OFFICE PROGRESS NOTE  Interval history:  Aaron Roth returns as scheduled. He completed cycle 7 of adjuvant CAPOX chemotherapy beginning 03/23/2012. He had no signs of allergic reaction. He noted increased nausea lasting approximately 4 days. No vomiting. He had intermittent loose stools following the chemotherapy. He has persistent cold sensitivity in the hands. He notes a "pins and needles" sensation in the feet.   Objective: Blood pressure 167/93, pulse 88, temperature 99 F (37.2 C), temperature source Oral, height 5' 9.5" (1.765 m), weight 201 lb 4.8 oz (91.309 kg).  Oropharynx is without thrush or ulceration. Lungs are clear. Regular cardiac rhythm. Port-A-Cath site is without erythema. Abdomen is soft and nontender. No hepatomegaly. Left lower quadrant colostomy. Extremities are without edema. Vibratory sense is intact over the fingertips per tuning fork exam.  Lab Results: Lab Results  Component Value Date   WBC 4.5 04/13/2012   HGB 13.4 04/13/2012   HCT 38.6 04/13/2012   MCV 87.5 04/13/2012   PLT 202 04/13/2012    Chemistry:    Chemistry      Component Value Date/Time   NA 137 03/23/2012 1118   K 3.8 03/23/2012 1118   CL 103 03/23/2012 1118   CO2 26 03/23/2012 1118   BUN 14 03/23/2012 1118   CREATININE 0.96 03/23/2012 1118      Component Value Date/Time   CALCIUM 9.0 03/23/2012 1118   ALKPHOS 92 03/23/2012 1118   AST 19 03/23/2012 1118   ALT 14 03/23/2012 1118   BILITOT 0.2* 03/23/2012 1118       Studies/Results: No results found.  Medications: I have reviewed the patient's current medications.  Assessment/Plan:  1. 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 He completed cycle 1 CAPOX beginning 11/13/2011; cycle 7 beginning 03/23/2012. 2. Delayed nausea-persistent despite the addition of Aloxi. Prophylactic Decadron was added beginning with cycle 3. He had no  nausea following cycle 6. 3. "Rash" and pruritus during the oxaliplatin infusion with cycle 6-potentially an allergic reaction to the oxaliplatin. He was premedicated with Benadryl, Pepcid, and Solu-Medrol with cycle 7. The oxaliplatin was started at a slow rate. He had no signs/symptoms of an allergic reaction. 4. Oxaliplatin neuropathy.  Disposition-Aaron Roth has completed 7 cycles of adjuvant CAPOX chemotherapy. Dr. Truett Perna recommends discontinuation of oxaliplatin for the eighth and final cycle due to progressive neuropathy symptoms. He will complete the Xeloda portion of the cycle as planned. He will return for a followup visit in 2 months. He will contact the office in the interim with any problems.  Plan reviewed with Dr. Derenda Fennel, Misty Stanley ANP/GNP-BC

## 2012-04-13 NOTE — Telephone Encounter (Signed)
gv wife appt schedule for August. Per 6/12 pof tx today cx'd.

## 2012-05-12 ENCOUNTER — Ambulatory Visit (INDEPENDENT_AMBULATORY_CARE_PROVIDER_SITE_OTHER): Payer: Self-pay | Admitting: Surgery

## 2012-05-12 ENCOUNTER — Encounter (INDEPENDENT_AMBULATORY_CARE_PROVIDER_SITE_OTHER): Payer: Self-pay | Admitting: Surgery

## 2012-05-12 VITALS — BP 140/80 | HR 66 | Temp 98.6°F | Resp 18 | Ht 69.5 in | Wt 202.4 lb

## 2012-05-12 DIAGNOSIS — Z85038 Personal history of other malignant neoplasm of large intestine: Secondary | ICD-10-CM

## 2012-05-12 NOTE — Progress Notes (Signed)
[facility]  Tyger Wichman, MD,  FACS [address]  [address] [place], [place]    [address] Phone:  [phone] FAX:  [phone]   Re:   Aaron Roth DOB:   05/30/1946 MRN:   [id number]  ASSESSMENT AND PLAN: 1.  Splenic flexure colon cancer.  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  Path - 4.0 cm mod differentiated adenoca, 4/15 nodes positive, KRAS positive (T3, N2), CEA - 1.2  Sees Dr. Sherrill has treated him with chemotx (CAPOX) for 6 months.  We will plan to give rectal contrast with CT scan in August.  I will do a colonoscopy in August.   I reviewed with the patient the indications and complications of a colonoscopy.  The primary complications, include, but are not limited to bleeding and perforation.  The patient was given literature about colonoscopy.  The patient was given a prescription for a mechanical bowel prep.   I will plan to reverse his colostomy in September (He has a family reunion in early Sept.)  I reviewed with the patient the findings and need for colon surgery. I discussed the role of laparoscopic and open surgery in colon surgery.  I reviewed the risks of surgery, including, but not limited to, infection, bleeding, nerve injury, anastomotic leaks, and possibility of colostomy.  I provided the patient literature on colon surgery.  I tried to answer all questions for the patient.  I will provide his bowel prep when I see pre-op.  2.  Smokes.  Has quit since surgery. 3.  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  We will probably leave this through surgery. 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  HISTORY OF PRESENT ILLNESS: Chief Complaint  Patient presents with  . Follow-up   Aaron Roth is a 66 y.o. (DOB: 08/28/1946)  AA male who is a patient of MCMANUS,KEITH, MD ([place]) and comes to me today for follow up of an obstructing splenic flexure colon cancer.  This is a patient from [place] and  presented to the [facility] with an obstructing splenic flexure colon ca while I was on call over Thanksgiving holiday.  I did a resection and end ostomy on 09/25/2011.    His final path showed a T3, N2 colon ca.  He is seeing Dr. B.Sherrill and has been treated with chemotx (CAPOX) for 6 months.  He has finished chemotx.  He has some neuropathy from the chemotx in his hands and feet, but he thinks that it is getting better.  He is accompanied with his son.  PHYSICAL EXAM: BP 140/80  Pulse 66  Temp 98.6 F (37 C) (Temporal)  Resp 18  Ht 5' 9.5" (1.765 m)  Wt 202 lb 6 oz (91.797 kg)  BMI 29.46 kg/m2  General: AA male who is alert and generally healthy appearing. He is in good spirits. HEENT: Normal. Pupils equal. Good dentition. Neck: Supple. No mass.  No thyroid mass.  Carotid pulse okay with no bruit. Lymph Nodes:  No supraclavicular or cervical nodes. Lungs: Clear to auscultation and symmetric breath sounds. Heart:  RRR. No murmur or rub. Abdomen: Well healed mid line incision.  Ostomy in LUQ is functioning and doing well. He says the "bag is too short".  We talked about alternate ways of emptying the bag.  Extremities:  Good strength and ROM  in upper and lower extremities. Neurologic:  Grossly intact to motor and sensory function. Psychiatric: He is in good   spirits today and looks much better than when I saw him in the hospital.  DATA REVIEWED: Reviewed Dr. Sherrill's notes.  Kloe Oates, MD, FACS Office:  [phone]  

## 2012-05-13 ENCOUNTER — Telehealth (INDEPENDENT_AMBULATORY_CARE_PROVIDER_SITE_OTHER): Payer: Self-pay

## 2012-05-13 NOTE — Telephone Encounter (Signed)
I left a message for Dr Kalman Drape nurse that the pt said Dr Truett Perna is ordering a ct scan and Dr Ezzard Standing wants rectal contrast added to evaluate his colon.

## 2012-05-16 ENCOUNTER — Encounter: Payer: Self-pay | Admitting: *Deleted

## 2012-05-16 NOTE — Telephone Encounter (Signed)
I called the pt to schedule his preop appointment.  I asked if he got scheduled for his ct yet and he states he is awaiting a call on that.

## 2012-05-16 NOTE — Progress Notes (Signed)
Patient saw Dr. Ezzard Standing on 05/12/12 and called office on 05/13/12 reporting Dr. Truett Perna needs to schedule him for CT scan and surgeon wants to add rectal contrast to evaluate his colon. Last chemo was 03/23/12 and last CT scan in system was from [facility] on 09/24/11. Dr. Truett Perna is aware and will order as appropriate.

## 2012-05-25 ENCOUNTER — Other Ambulatory Visit: Payer: Self-pay | Admitting: Oncology

## 2012-05-25 DIAGNOSIS — Z85038 Personal history of other malignant neoplasm of large intestine: Secondary | ICD-10-CM

## 2012-06-02 ENCOUNTER — Telehealth: Payer: Self-pay | Admitting: Oncology

## 2012-06-02 ENCOUNTER — Other Ambulatory Visit: Payer: Self-pay | Admitting: *Deleted

## 2012-06-02 DIAGNOSIS — Z85038 Personal history of other malignant neoplasm of large intestine: Secondary | ICD-10-CM

## 2012-06-02 NOTE — Telephone Encounter (Signed)
S/w pt today re lb/ct for 8/5 @ 1:30 pm and 2:30 pm. Lb moved from 8/14 to 8/5 to coordinate w/ct - lb needed for ct. F/u appt remains 8/14. Pt is aware of changes and instructions and has sent pt to get schedule/prep/written instructions. Info given to son today.

## 2012-06-02 NOTE — Telephone Encounter (Signed)
Add to previous note. Per hand written note on copy of 7/24 pof given to me by Darl Pikes pt to have rectal contrast. [facility] closed. S/w Deidre at [facility] and she will add comments re rectal contrast to appt along w/ext for desk nurse. Rad to call if they have questions.

## 2012-06-03 ENCOUNTER — Encounter (HOSPITAL_COMMUNITY)
Admission: RE | Disposition: A | Discharge status: Home / Self Care | Payer: Self-pay | Source: Ambulatory Visit | Attending: Surgery

## 2012-06-03 ENCOUNTER — Telehealth: Payer: Self-pay | Admitting: Medical Oncology

## 2012-06-03 ENCOUNTER — Encounter (HOSPITAL_COMMUNITY): Payer: Self-pay | Admitting: *Deleted

## 2012-06-03 ENCOUNTER — Ambulatory Visit (HOSPITAL_COMMUNITY)
Admission: RE | Admit: 2012-06-03 | Discharge: 2012-06-03 | Disposition: A | Discharge status: Home / Self Care | Payer: Medicare Other | Source: Ambulatory Visit | Attending: Surgery | Admitting: Surgery

## 2012-06-03 DIAGNOSIS — K5289 Other specified noninfective gastroenteritis and colitis: Secondary | ICD-10-CM

## 2012-06-03 DIAGNOSIS — T451X5A Adverse effect of antineoplastic and immunosuppressive drugs, initial encounter: Secondary | ICD-10-CM | POA: Insufficient documentation

## 2012-06-03 DIAGNOSIS — Z85038 Personal history of other malignant neoplasm of large intestine: Secondary | ICD-10-CM

## 2012-06-03 DIAGNOSIS — G622 Polyneuropathy due to other toxic agents: Secondary | ICD-10-CM | POA: Insufficient documentation

## 2012-06-03 HISTORY — PX: COLONOSCOPY: SHX5424

## 2012-06-03 SURGERY — COLONOSCOPY
Anesthesia: Moderate Sedation

## 2012-06-03 MED ORDER — MIDAZOLAM HCL 10 MG/2ML IJ SOLN
INTRAMUSCULAR | Status: AC
  Filled 2012-06-03: qty 2

## 2012-06-03 MED ORDER — MIDAZOLAM HCL 5 MG/5ML IJ SOLN
INTRAMUSCULAR | Status: DC | PRN
  Administered 2012-06-03: 1 mg via INTRAVENOUS
  Administered 2012-06-03: 2 mg via INTRAVENOUS
  Administered 2012-06-03: 1 mg via INTRAVENOUS

## 2012-06-03 MED ORDER — HEPARIN SOD (PORK) LOCK FLUSH 100 UNIT/ML IV SOLN
INTRAVENOUS | Status: AC
  Filled 2012-06-03: qty 5

## 2012-06-03 MED ORDER — FENTANYL CITRATE 0.05 MG/ML IJ SOLN
INTRAMUSCULAR | Status: DC | PRN
  Administered 2012-06-03 (×2): 25 ug via INTRAVENOUS

## 2012-06-03 MED ORDER — SODIUM CHLORIDE 0.9 % IV SOLN
Freq: Once | INTRAVENOUS | Status: AC
  Administered 2012-06-03: 500 mL via INTRAVENOUS

## 2012-06-03 MED ORDER — FENTANYL CITRATE 0.05 MG/ML IJ SOLN
INTRAMUSCULAR | Status: AC
  Filled 2012-06-03: qty 2

## 2012-06-03 NOTE — Telephone Encounter (Signed)
Verifying heparin flush protocol . Last port flush was 03/23/12. Endoscopy will flush it today . I put in flush appointment request for 6-8 weeks.

## 2012-06-03 NOTE — Brief Op Note (Signed)
#  [id number]  Colonoscopy through ostomy (60 - 70 cm) and rectum (40 cm) was negative except for some diversion colitis of his distal rectum.  Tolerated procedure well.  Ovidio Kin, MD, [facility] Surgery Pager: [phone] Office phone:  [phone]

## 2012-06-03 NOTE — Interval H&P Note (Signed)
History and Physical Interval Note:  06/03/2012 11:45 AM  Aaron Roth  has presented today for surgery, with the diagnosis of colon ca  The various methods of treatment have been discussed with the patient and family.  Son, Aaron Roth, is here.  After consideration of risks, benefits and other options for treatment, the patient has consented to  Procedure(s) (LRB): COLONOSCOPY (N/A) as a surgical intervention .    The patient's history has been reviewed, patient examined, no change in status, stable for surgery.  I have reviewed the patient's chart and labs.  Questions were answered to the patient's satisfaction.     Kateleen Encarnacion H

## 2012-06-03 NOTE — H&P (View-Only) (Signed)
[facility]  Ovidio Kin, MD,  FACS [address] [place]  [address] [place], [place]    [address] Phone:  [phone] FAX:  [phone]   Re:   Aaron Roth DOB:   Mar 26, 1946 MRN:   [id number]  ASSESSMENT AND PLAN: 1.  Splenic flexure colon cancer.  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  Path - 4.0 cm mod differentiated adenoca, 4/15 nodes positive, KRAS positive (T3, N2), CEA - 1.2  Sees Dr. Truett Perna has treated him with chemotx (CAPOX) for 6 months.  We will plan to give rectal contrast with CT scan in August.  I will do a colonoscopy in August.   I reviewed with the patient the indications and complications of a colonoscopy.  The primary complications, include, but are not limited to bleeding and perforation.  The patient was given literature about colonoscopy.  The patient was given a prescription for a mechanical bowel prep.   I will plan to reverse his colostomy in September (He has a family reunion in early Sept.)  I reviewed with the patient the findings and need for colon surgery. I discussed the role of laparoscopic and open surgery in colon surgery.  I reviewed the risks of surgery, including, but not limited to, infection, bleeding, nerve injury, anastomotic leaks, and possibility of colostomy.  I provided the patient literature on colon surgery.  I tried to answer all questions for the patient.  I will provide his bowel prep when I see pre-op.  2.  Smokes.  Has quit since surgery. 3.  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  We will probably leave this through surgery. 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  HISTORY OF PRESENT ILLNESS: Chief Complaint  Patient presents with  . Follow-up   Aaron Roth is a 66 y.o. (DOB: 06/07/1946)  AA male who is a patient of [facility], [facility]) and comes to me today for follow up of an obstructing splenic flexure colon cancer.  This is a patient from [facility] and  presented to the [facility] with an obstructing splenic flexure colon ca while I was on call over Thanksgiving holiday.  I did a resection and end ostomy on 09/25/2011.    His final path showed a T3, N2 colon ca.  He is seeing Dr. Leonard Schwartz.Sherrill and has been treated with chemotx (CAPOX) for 6 months.  He has finished chemotx.  He has some neuropathy from the chemotx in his hands and feet, but he thinks that it is getting better.  He is accompanied with his son.  PHYSICAL EXAM: BP 140/80  Pulse 66  Temp 98.6 F (37 C) (Temporal)  Resp 18  Ht 5' 9.5" (1.765 m)  Wt 202 lb 6 oz (91.797 kg)  BMI 29.46 kg/m2  General: AA male who is alert and generally healthy appearing. He is in good spirits. HEENT: Normal. Pupils equal. Good dentition. Neck: Supple. No mass.  No thyroid mass.  Carotid pulse okay with no bruit. Lymph Nodes:  No supraclavicular or cervical nodes. Lungs: Clear to auscultation and symmetric breath sounds. Heart:  RRR. No murmur or rub. Abdomen: Well healed mid line incision.  Ostomy in LUQ is functioning and doing well. He says the "bag is too short".  We talked about alternate ways of emptying the bag.  Extremities:  Good strength and ROM  in upper and lower extremities. Neurologic:  Grossly intact to motor and sensory function. Psychiatric: He is in good  spirits today and looks much better than when I saw him in the hospital.  DATA REVIEWED: Reviewed Dr. Kalman Drape notes.  Ovidio Kin, MD, FACS Office:  [phone]

## 2012-06-04 NOTE — Op Note (Signed)
NAME:  Aaron Roth, Aaron Roth NO.:  [id number]  MEDICAL RECORD NO.:  [id number]  LOCATION:  [facility]                         FACILITY:  [facility]  PHYSICIAN:  Sandria Bales. Ezzard Standing, M.D.  DATE OF BIRTH:  06-17-1946  DATE OF PROCEDURE:  06/03/2012                              OPERATIVE REPORT  PREOPERATIVE DIAGNOSIS:  History of splenic flexure colon cancer.  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  PROCEDURE:  Colonoscopy through LUQ ostomy (60 cm) and through rectum (40 cm).  SURGEON:  Sandria Bales. Ezzard Standing, MD  FIRST ASSISTANT:  None.  ANESTHESIA:  50 mcg of fentanyl, 4 mg of Versed.  COMPLICATIONS:  None.  INDICATION FOR PROCEDURE:  [name] is a 66 year old, African American male from [place], who sees Dr. Jeanmarie Plant as his primary care doctor.  He presented with an obstructing splenic flexure colon cancer, which I did a resection and end-colostomy in September 25, 2011.  He has now completed chemotherapy by Dr. Mancel Bale and we are planning reversal of this colostomy within the next month or so.  He comes for colonoscopy to evaluate any other synchronous lesions.  The indication and potential complications of colonoscopy was explained to the patient.  OPERATIVE NOTE:  He was taken to room #3 at [facility] suite. He had his left subclavian Port-A-Cath accessed.  He was monitored with a pulse ox, BP cuff, and EKG.  He was given 2 liters of nasal O2.  A time-out was held.  He was given a total of 50 mcg of fentanyl, 4 mg of Versed.  I passed 1st the colonoscope through his LUQ ostomy.  I advanced the scope about 50-70 cm and saw the colon well.  I advanced to the cecum.  The colon wall was coated with a sticky stool.  I was able to aspirate and clean out some of this stool, but it did compromise seeing small lesions.  However, I saw no gross lesion of the colon.  The scope was then withdrawn to the transverse  colon out the ostomy.  No polyps or lesions.  The patient was then rolled in a left lateral decubitus position.  I passed the colonoscope up his rectum and advanced to about 40 cm, but was unable to advance any further, which is about how much colon I think he has remaining.  I am not sure I saw the end of his colon.  He did have some evidence of diversion colitis in the most distal colon, but otherwise the lining of the colon was unremarkable.  So, there were no residual polyps or lesions identified.  He is going to get a CT scan for followup of his cancer with rectal contrast to help document the length of his distal segment, though he has got at least 40 cm by colonoscopy and then we will plan the reversal of his colostomy.   Sandria Bales. Ezzard Standing, M.D., FACS   DHN/MEDQ  D:  06/03/2012  T:  06/04/2012  Job:  [id number]  cc:   Ladene Artist, M.D. Fax: [phone]  Jeanmarie Plant, MD Fax: [phone]

## 2012-06-06 ENCOUNTER — Other Ambulatory Visit (HOSPITAL_BASED_OUTPATIENT_CLINIC_OR_DEPARTMENT_OTHER): Payer: Medicare Other | Admitting: Lab

## 2012-06-06 ENCOUNTER — Encounter (HOSPITAL_COMMUNITY): Payer: Self-pay | Admitting: Surgery

## 2012-06-06 ENCOUNTER — Ambulatory Visit (HOSPITAL_COMMUNITY)
Admission: RE | Admit: 2012-06-06 | Discharge: 2012-06-06 | Disposition: A | Discharge status: Home / Self Care | Payer: Medicare Other | Source: Ambulatory Visit | Attending: Oncology | Admitting: Oncology

## 2012-06-06 DIAGNOSIS — C186 Malignant neoplasm of descending colon: Secondary | ICD-10-CM

## 2012-06-06 DIAGNOSIS — R911 Solitary pulmonary nodule: Secondary | ICD-10-CM | POA: Insufficient documentation

## 2012-06-06 DIAGNOSIS — C189 Malignant neoplasm of colon, unspecified: Secondary | ICD-10-CM | POA: Insufficient documentation

## 2012-06-06 DIAGNOSIS — Z85038 Personal history of other malignant neoplasm of large intestine: Secondary | ICD-10-CM

## 2012-06-06 DIAGNOSIS — E042 Nontoxic multinodular goiter: Secondary | ICD-10-CM | POA: Insufficient documentation

## 2012-06-06 DIAGNOSIS — Z933 Colostomy status: Secondary | ICD-10-CM | POA: Insufficient documentation

## 2012-06-06 LAB — CBC WITH DIFFERENTIAL/PLATELET
Basophils Absolute: 0.1 10*3/uL (ref 0.0–0.1)
Eosinophils Absolute: 0.1 10*3/uL (ref 0.0–0.5)
HGB: 15.2 g/dL (ref 13.0–17.1)
MCV: 90 fL (ref 79.3–98.0)
MONO#: 0.5 10*3/uL (ref 0.1–0.9)
NEUT#: 2.5 10*3/uL (ref 1.5–6.5)
RBC: 5.07 10*6/uL (ref 4.20–5.82)
RDW: 15.2 % — ABNORMAL HIGH (ref 11.0–14.6)
WBC: 6.2 10*3/uL (ref 4.0–10.3)
lymph#: 3 10*3/uL (ref 0.9–3.3)

## 2012-06-06 LAB — CMP (CANCER CENTER ONLY)
ALT(SGPT): 20 U/L (ref 10–47)
Albumin: 4 g/dL (ref 3.3–5.5)
CO2: 27 mEq/L (ref 18–33)
Chloride: 100 mEq/L (ref 98–108)
Glucose, Bld: 93 mg/dL (ref 73–118)
Potassium: 4.3 mEq/L (ref 3.3–4.7)
Sodium: 142 mEq/L (ref 128–145)
Total Protein: 8 g/dL (ref 6.4–8.1)

## 2012-06-06 LAB — CEA: CEA: 2.1 ng/mL (ref 0.0–5.0)

## 2012-06-06 MED ORDER — IOHEXOL 300 MG/ML  SOLN
100.0000 mL | Freq: Once | INTRAMUSCULAR | Status: AC | PRN
  Administered 2012-06-06: 100 mL via INTRAVENOUS

## 2012-06-09 ENCOUNTER — Telehealth: Payer: Self-pay | Admitting: Oncology

## 2012-06-09 NOTE — Telephone Encounter (Signed)
l/m with port flush appt   aom

## 2012-06-15 ENCOUNTER — Ambulatory Visit (HOSPITAL_BASED_OUTPATIENT_CLINIC_OR_DEPARTMENT_OTHER): Payer: Medicare Other | Admitting: Oncology

## 2012-06-15 ENCOUNTER — Telehealth: Payer: Self-pay | Admitting: Oncology

## 2012-06-15 ENCOUNTER — Other Ambulatory Visit: Payer: Self-pay | Admitting: Lab

## 2012-06-15 VITALS — BP 153/80 | HR 72 | Temp 97.0°F | Resp 18 | Ht 69.0 in | Wt 204.5 lb

## 2012-06-15 DIAGNOSIS — C186 Malignant neoplasm of descending colon: Secondary | ICD-10-CM

## 2012-06-15 DIAGNOSIS — G589 Mononeuropathy, unspecified: Secondary | ICD-10-CM

## 2012-06-15 DIAGNOSIS — Z85038 Personal history of other malignant neoplasm of large intestine: Secondary | ICD-10-CM

## 2012-06-15 NOTE — Telephone Encounter (Signed)
Gave pt appt schedule for November.

## 2012-06-15 NOTE — Progress Notes (Signed)
[facility]    OFFICE PROGRESS NOTE   INTERVAL HISTORY:   He returns as scheduled. He reports improvement in neuropathy symptoms, especially in the feet. He continues to have tingling in the fingertips. This does not interfere with activity. He has noted a "discomfort "in the feet when flexing the neck forward. This occurs intermittently. No weakness or neck pain. This has occurred since the completion of chemotherapy.  He otherwise feels well. He is scheduled for a colostomy reversal procedure on 07/14/2012.  Objective:  Vital signs in last 24 hours:  Blood pressure 153/80, pulse 72, temperature 97 F (36.1 C), temperature source Oral, resp. rate 18, height 5\' 9"  (1.753 m), weight 204 lb 8 oz (92.761 kg).    HEENT: Slight enlargement of the left compared to the right tonsil, no discrete mass, neck without mass Lymphatics: 1/2 cm mobile left low cervical/scalene node, pea sized mobile left axillary node, no other palpable cervical, supraclavicular, axillary, or inguinal nodes Resp: Lungs with bronchial sounds at the upper posterior chest bilaterally, no respiratory distress Cardio: Regular rate and rhythm GI: No hepatosplenomegaly, nontender, left lower quadrant colostomy Vascular: No leg edema Neuro: The vibratory sense is intact at the fingertips bilaterally, normal strength in the arms, legs, and with dorsi flexion at the feet. Normal strength with lateral movement at the neck.  Skin: There is an area of erythema measuring approximately 2 cm at the left low posterior neck (she reports recent "poison ivy "exposure in this area   Portacath/PICC-without erythema  Lab Results:  Lab Results  Component Value Date   WBC 6.2 06/06/2012   HGB 15.2 06/06/2012   HCT 45.6 06/06/2012   MCV 90.0 06/06/2012   PLT 289 06/06/2012   CEA 2.1 on 06/06/2012    X-rays: Restaging CTs of the chest, abdomen, and pelvis on 06/06/2012-4 mm left upper lobe nodule was present on the  previous scan. No evidence of metastatic disease to the chest. No focal hepatic lesion. No evidence of recurrent cancer in the abdomen or pelvis   Medications: I have reviewed the patient's current medications.  Assessment/Plan: 1. 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 He completed cycle 1 CAPOX beginning 11/13/2011; cycle 7 beginning 03/23/2012. Cycle 8 was completed beginning on 04/13/2012 without oxaliplatin.              -restaging CT scans on 06/06/2012 without evidence of recurrent disease 2. Delayed nausea-persistent despite the addition of Aloxi. Prophylactic Decadron was added beginning with cycle 3. He had no nausea following cycle 6. 3. "Rash" and pruritus during the oxaliplatin infusion with cycle 6-potentially an allergic reaction to the oxaliplatin. He was premedicated with Benadryl, Pepcid, and Solu-Medrol with cycle 7. The oxaliplatin was started at a slow rate. He had no signs/symptoms of an allergic reaction. 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 Foot/lower leg discomfort when flexing the neck forward-most likely a manifestation of oxaliplatin neuropathy, no other neurologic symptoms and no neck pain. 6. Small left scalene node-likely benign, potentially reactive in the setting of a left lower neck rash  Disposition:  He remains in clinical remission from colon cancer. He is scheduled to undergo a colostomy reversal procedure by Dr. Ezzard Standing on 07/14/2012. We will ask Dr. Ezzard Standing to remove the Port-A-Cath at the same time.  Mr. Eastridge will return for an office visit and CEA  in 3 months.   Thornton Papas, MD  06/15/2012  11:19 AM

## 2012-06-15 NOTE — Progress Notes (Signed)
Surgery scheduled for 07/14/12 and plans on having PAC removed at this time. Last port flush was 03/23/12.

## 2012-06-29 ENCOUNTER — Telehealth: Payer: Self-pay | Admitting: *Deleted

## 2012-06-29 ENCOUNTER — Ambulatory Visit (INDEPENDENT_AMBULATORY_CARE_PROVIDER_SITE_OTHER): Payer: Self-pay | Admitting: Surgery

## 2012-06-29 ENCOUNTER — Encounter (INDEPENDENT_AMBULATORY_CARE_PROVIDER_SITE_OTHER): Payer: Self-pay | Admitting: Surgery

## 2012-06-29 ENCOUNTER — Telehealth (INDEPENDENT_AMBULATORY_CARE_PROVIDER_SITE_OTHER): Payer: Self-pay

## 2012-06-29 VITALS — BP 148/86 | HR 74 | Temp 97.6°F | Resp 18 | Ht 67.5 in | Wt 206.4 lb

## 2012-06-29 DIAGNOSIS — Z85038 Personal history of other malignant neoplasm of large intestine: Secondary | ICD-10-CM

## 2012-06-29 NOTE — Telephone Encounter (Signed)
Verlon Au called [place] from preadmissions.  Mr Magid wants his preop labs done at the cancer center.  His cxr is current.  He didn't want his labs done at the Hospital.  I spoke to Dr Ezzard Standing and he said this is ok for the cancer center to do the labs.  He also plans to remove the portacath.  I left a vm for Dr Kalman Drape nurse to see if they will take care of the labs.  I called back to [facility] and spoke to [place] and informed her of above.

## 2012-06-29 NOTE — Progress Notes (Addendum)
[facility]  Makeshia Seat, MD,  FACS [address]  [address] [place], [place]    [address] Phone:  [phone] FAX:  [phone]   Re:   Aaron Roth DOB:   12/17/1945 MRN:   [id number]  ASSESSMENT AND PLAN: 1.  Splenic flexure colon cancer.  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  Path - 4.0 cm mod differentiated adenoca, 4/15 nodes positive, KRAS positive (T3, N2), CEA - 1.2  Dr. Sherrill has treated him with chemotx (CAPOX) for 6 months.  Dr. Sherrill saw him 06/15/2012.  Abdominal CT scan 06/06/2012 - neg.  They did see a small nodule in the RUL lung on the chest portion.  I gave the patient copies of the report.  Colonoscopy 06/03/2012 was negative.  He has approx. 40 cm distal colon  segment.     Plan colostomy reversal date is 07/14/2012 at [facility].  I will take his porta cath out at the same time.  I reviewed with the patient colostomy reversal surgery. I discussed the role of laparoscopic and open surgery in colon surgery.  I reviewed the risks of surgery, including, but not limited to, infection, bleeding, nerve injury, anastomotic leaks, and possibility of colostomy.  I gave him his bowel prep prescription.  His son is with him.  2.  Smokes.  Has quit since initial surgery. 3.  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  Per Dr. Sherrill's note - we can take the porta cath out at the time of colostomy reversal. 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  HISTORY OF PRESENT ILLNESS: Chief Complaint  Patient presents with  . Pre-op Exam   Aaron Roth is a 66 y.o. (DOB: 09/02/1946)  AA male who is a patient of MCMANUS,KEITH, MD ([place]) and comes to me today for follow up of an obstructing splenic flexure colon cancer and for plans to reverse his colostomy.  We went over the planned reversal.  He is accompanied with his son.  He is planning to go to [place] to visit his sister.  But he is ready for the colostomy reversal and  understands well the planned surgery.  History of Colon Cancer: This is a patient from [place] and presented to the [facility] with an obstructing splenic flexure colon ca while I was on call over Thanksgiving holiday.  I did a resection and end ostomy on 09/25/2011.    His final path showed a T3, N2 colon ca.  He is seeing Dr. B.Sherrill and has been treated with chemotx (CAPOX) for 6 months.  He has finished chemotx.  He has some neuropathy from the chemotx in his hands and feet, but he thinks that it is getting better.    PHYSICAL EXAM: BP 148/86  Pulse 74  Temp 97.6 F (36.4 C) (Oral)  Resp 18  Ht 5' 7.5" (1.715 m)  Wt 206 lb 6 oz (93.611 kg)  BMI 31.85 kg/m2  General: AA male who is alert and generally healthy appearing. He is in good spirits. HEENT: Normal. Pupils equal. Good dentition. Neck: Supple. No mass.  No thyroid mass.  Carotid pulse okay with no bruit. Lymph Nodes:  No supraclavicular or cervical nodes. Lungs: Clear to auscultation and symmetric breath sounds. Heart:  RRR. No murmur or rub. Abdomen: Well healed mid line incision.  Ostomy in LUQ is functioning and doing well. Extremities:  Good strength and ROM  in upper and lower extremities. Neurologic:  Still has   numbness in his hands and feet.  His hands bother him more and have been slower to improve.  DATA REVIEWED: Reviewed Dr. Sherrill's notes.  Rubel Heckard, MD, FACS Office:  [phone]  

## 2012-06-29 NOTE — Telephone Encounter (Signed)
Aaron Roth called to let us know Dr Truett Perna gave the ok to draw labs there.  She needs [place] to put in the orders for the preop labs.  I will find out what is needed and put those in.

## 2012-06-29 NOTE — Telephone Encounter (Signed)
Received call from Sarah-RN from Dr. Lavonda Jumbo office.  Pt requesting labs to be drawn from port-a-cath at [facility] for surgery schedule 9/12.  Per Dr. Truett Perna, OK for labs to be drawn [facility] though port; informed Sarah-RN that lab orders need to be entered.  Sarah-RN verbalized confirmation of need for lab orders and POF completed.

## 2012-06-30 ENCOUNTER — Other Ambulatory Visit (INDEPENDENT_AMBULATORY_CARE_PROVIDER_SITE_OTHER): Payer: Self-pay

## 2012-06-30 DIAGNOSIS — Z85038 Personal history of other malignant neoplasm of large intestine: Secondary | ICD-10-CM

## 2012-07-01 ENCOUNTER — Encounter (HOSPITAL_COMMUNITY): Payer: Self-pay | Admitting: *Deleted

## 2012-07-01 ENCOUNTER — Telehealth: Payer: Self-pay | Admitting: Oncology

## 2012-07-01 DIAGNOSIS — Z433 Encounter for attention to colostomy: Secondary | ICD-10-CM

## 2012-07-01 DIAGNOSIS — C801 Malignant (primary) neoplasm, unspecified: Secondary | ICD-10-CM

## 2012-07-01 DIAGNOSIS — Z789 Other specified health status: Secondary | ICD-10-CM

## 2012-07-01 HISTORY — DX: Encounter for attention to colostomy: Z43.3

## 2012-07-01 HISTORY — DX: Other specified health status: Z78.9

## 2012-07-01 HISTORY — DX: Malignant (primary) neoplasm, unspecified: C80.1

## 2012-07-01 NOTE — Progress Notes (Signed)
07-01-12 Pt. To pick up Hibiclens soap from [facility] center prior to surgery day(has been instructed on use). W. Kennon Portela

## 2012-07-01 NOTE — Progress Notes (Signed)
07-01-12 1245 Pt.instructed as same day labs. Labs to be done 0800 am at [facility] with "flush RN"-has been arranged. Pt. Instructed on Hibiclens soap shower PM/ AM of(avoiding face and private area). Will have responsible party as driver and to remain x 24 hours once d/c home.Teach back method used.W. Kennon Portela

## 2012-07-01 NOTE — Telephone Encounter (Signed)
S/w pt re new appt for 9/10. appt fo r9/12 cx due to conflict w/procedure. Per nurse ok to do before 9/12.

## 2012-07-01 NOTE — Telephone Encounter (Signed)
Per 8/28 pof pt needs lb drawn from port here @ [facility] on 9/12 at the request of Dr. Ezzard Standing. Called pt re appt for 9/12 @ 8 am and per pt his procedure is that day and he is suppose to be there @ 7am. Also per pt the lab was needed for that procedure. We cannot bring pt in for lb appt any earlier than 8 am. Message to desk nurse and pt aware I will get back to him after getting more instruction from nurse/BS.

## 2012-07-06 ENCOUNTER — Ambulatory Visit (INDEPENDENT_AMBULATORY_CARE_PROVIDER_SITE_OTHER): Payer: Self-pay | Admitting: Surgery

## 2012-07-12 ENCOUNTER — Other Ambulatory Visit: Payer: Medicare Other

## 2012-07-12 ENCOUNTER — Other Ambulatory Visit: Payer: Self-pay | Admitting: *Deleted

## 2012-07-12 ENCOUNTER — Ambulatory Visit (HOSPITAL_BASED_OUTPATIENT_CLINIC_OR_DEPARTMENT_OTHER): Payer: Medicare Other

## 2012-07-12 VITALS — BP 160/89 | HR 78 | Temp 98.2°F | Resp 18

## 2012-07-12 DIAGNOSIS — C186 Malignant neoplasm of descending colon: Secondary | ICD-10-CM

## 2012-07-12 DIAGNOSIS — Z85038 Personal history of other malignant neoplasm of large intestine: Secondary | ICD-10-CM

## 2012-07-12 DIAGNOSIS — C189 Malignant neoplasm of colon, unspecified: Secondary | ICD-10-CM

## 2012-07-12 LAB — COMPREHENSIVE METABOLIC PANEL (CC13)
ALT: 14 U/L (ref 0–55)
Albumin: 4 g/dL (ref 3.5–5.0)
Alkaline Phosphatase: 66 U/L (ref 40–150)
CO2: 24 mEq/L (ref 22–29)
Glucose: 90 mg/dl (ref 70–99)
Potassium: 4.1 mEq/L (ref 3.5–5.1)
Sodium: 140 mEq/L (ref 136–145)
Total Protein: 7.2 g/dL (ref 6.4–8.3)

## 2012-07-12 LAB — CBC WITH DIFFERENTIAL/PLATELET
Basophils Absolute: 0.1 10*3/uL (ref 0.0–0.1)
Eosinophils Absolute: 0.1 10*3/uL (ref 0.0–0.5)
HGB: 15 g/dL (ref 13.0–17.1)
MONO#: 0.6 10*3/uL (ref 0.1–0.9)
NEUT#: 3.9 10*3/uL (ref 1.5–6.5)
RBC: 5.03 10*6/uL (ref 4.20–5.82)
RDW: 14.8 % — ABNORMAL HIGH (ref 11.0–14.6)
WBC: 7.4 10*3/uL (ref 4.0–10.3)
lymph#: 2.7 10*3/uL (ref 0.9–3.3)

## 2012-07-12 MED ORDER — HEPARIN SOD (PORK) LOCK FLUSH 100 UNIT/ML IV SOLN
500.0000 [IU] | Freq: Once | INTRAVENOUS | Status: AC
  Administered 2012-07-12: 500 [IU] via INTRAVENOUS
  Filled 2012-07-12: qty 5

## 2012-07-12 MED ORDER — SODIUM CHLORIDE 0.9 % IJ SOLN
10.0000 mL | INTRAMUSCULAR | Status: DC | PRN
  Administered 2012-07-12: 10 mL via INTRAVENOUS
  Filled 2012-07-12: qty 10

## 2012-07-13 NOTE — Anesthesia Preprocedure Evaluation (Addendum)
Anesthesia Evaluation  Patient identified by MRN, date of birth, ID band Patient awake    Reviewed: Allergy & Precautions, H&P , NPO status , Patient's Chart, lab work & pertinent test results  Airway Mallampati: II TM Distance: >3 FB Neck ROM: full    Dental No notable dental hx. (+) Edentulous Upper and Loose,    Pulmonary neg pulmonary ROS,  breath sounds clear to auscultation  Pulmonary exam normal       Cardiovascular Exercise Tolerance: Good negative cardio ROS  Rhythm:regular Rate:Normal  Borderline prolonged QT   Neuro/Psych negative neurological ROS  negative psych ROS   GI/Hepatic negative GI ROS, Neg liver ROS, GERD-  Controlled,  Endo/Other  negative endocrine ROS  Renal/GU negative Renal ROS  negative genitourinary   Musculoskeletal   Abdominal   Peds  Hematology negative hematology ROS (+)   Anesthesia Other Findings   Reproductive/Obstetrics negative OB ROS                          Anesthesia Physical Anesthesia Plan  ASA: II  Anesthesia Plan: General   Post-op Pain Management:    Induction: Intravenous  Airway Management Planned: Oral ETT  Additional Equipment:   Intra-op Plan:   Post-operative Plan: Extubation in OR  Informed Consent: I have reviewed the patients History and Physical, chart, labs and discussed the procedure including the risks, benefits and alternatives for the proposed anesthesia with the patient or authorized representative who has indicated his/her understanding and acceptance.   Dental Advisory Given  Plan Discussed with: CRNA and Surgeon  Anesthesia Plan Comments:         Anesthesia Quick Evaluation

## 2012-07-14 ENCOUNTER — Encounter (HOSPITAL_COMMUNITY): Payer: Self-pay | Admitting: *Deleted

## 2012-07-14 ENCOUNTER — Encounter (HOSPITAL_COMMUNITY): Payer: Self-pay | Admitting: Anesthesiology

## 2012-07-14 ENCOUNTER — Encounter (HOSPITAL_COMMUNITY)
Admission: RE | Disposition: A | Discharge status: Home / Self Care | Payer: Self-pay | Source: Ambulatory Visit | Attending: Surgery

## 2012-07-14 ENCOUNTER — Inpatient Hospital Stay (HOSPITAL_COMMUNITY)
Admission: RE | Admit: 2012-07-14 | Discharge: 2012-07-20 | DRG: 336 | Disposition: A | Discharge status: Home / Self Care | Payer: Medicare Other | Source: Ambulatory Visit | Attending: Surgery | Admitting: Surgery

## 2012-07-14 ENCOUNTER — Other Ambulatory Visit: Payer: Medicare Other | Admitting: Lab

## 2012-07-14 ENCOUNTER — Inpatient Hospital Stay (HOSPITAL_COMMUNITY): Payer: Medicare Other | Admitting: Anesthesiology

## 2012-07-14 DIAGNOSIS — K219 Gastro-esophageal reflux disease without esophagitis: Secondary | ICD-10-CM | POA: Diagnosis present

## 2012-07-14 DIAGNOSIS — Z452 Encounter for adjustment and management of vascular access device: Secondary | ICD-10-CM

## 2012-07-14 DIAGNOSIS — T451X5A Adverse effect of antineoplastic and immunosuppressive drugs, initial encounter: Secondary | ICD-10-CM | POA: Diagnosis present

## 2012-07-14 DIAGNOSIS — G589 Mononeuropathy, unspecified: Secondary | ICD-10-CM | POA: Diagnosis present

## 2012-07-14 DIAGNOSIS — K660 Peritoneal adhesions (postprocedural) (postinfection): Secondary | ICD-10-CM

## 2012-07-14 DIAGNOSIS — C185 Malignant neoplasm of splenic flexure: Secondary | ICD-10-CM | POA: Diagnosis present

## 2012-07-14 DIAGNOSIS — Z433 Encounter for attention to colostomy: Secondary | ICD-10-CM

## 2012-07-14 DIAGNOSIS — Z933 Colostomy status: Secondary | ICD-10-CM

## 2012-07-14 HISTORY — DX: Other specified health status: Z78.9

## 2012-07-14 HISTORY — PX: COLOSTOMY TAKEDOWN: SHX5258

## 2012-07-14 HISTORY — PX: PORT-A-CATH REMOVAL: SHX5289

## 2012-07-14 LAB — SURGICAL PCR SCREEN: Staphylococcus aureus: NEGATIVE

## 2012-07-14 SURGERY — CLOSURE, COLOSTOMY, LAPAROSCOPIC
Anesthesia: General | Site: Chest | Wound class: Clean Contaminated

## 2012-07-14 MED ORDER — NEOSTIGMINE METHYLSULFATE 1 MG/ML IJ SOLN
INTRAMUSCULAR | Status: DC | PRN
  Administered 2012-07-14: 4 mg via INTRAVENOUS

## 2012-07-14 MED ORDER — DEXTROSE 5 % IV SOLN
2.0000 g | INTRAVENOUS | Status: AC
  Administered 2012-07-14: 2 g via INTRAVENOUS

## 2012-07-14 MED ORDER — PHENOL 1.4 % MT LIQD
2.0000 | OROMUCOSAL | Status: DC | PRN
  Administered 2012-07-14: 2 via OROMUCOSAL
  Filled 2012-07-14: qty 177

## 2012-07-14 MED ORDER — ONDANSETRON HCL 4 MG PO TABS: 4.0000 mg | ORAL_TABLET | Freq: Four times a day (QID) | ORAL | Status: DC | PRN

## 2012-07-14 MED ORDER — ROCURONIUM BROMIDE 100 MG/10ML IV SOLN
INTRAVENOUS | Status: DC | PRN
  Administered 2012-07-14: 50 mg via INTRAVENOUS

## 2012-07-14 MED ORDER — PROPOFOL 10 MG/ML IV BOLUS
INTRAVENOUS | Status: DC | PRN
  Administered 2012-07-14: 160 mg via INTRAVENOUS

## 2012-07-14 MED ORDER — DIPHENHYDRAMINE HCL 12.5 MG/5ML PO ELIX
12.5000 mg | ORAL_SOLUTION | Freq: Four times a day (QID) | ORAL | Status: DC | PRN
  Filled 2012-07-14: qty 5

## 2012-07-14 MED ORDER — NALOXONE HCL 0.4 MG/ML IJ SOLN: 0.4000 mg | INTRAMUSCULAR | Status: DC | PRN

## 2012-07-14 MED ORDER — HYDROMORPHONE HCL PF 1 MG/ML IJ SOLN
INTRAMUSCULAR | Status: DC | PRN
  Administered 2012-07-14 (×4): 0.5 mg via INTRAVENOUS

## 2012-07-14 MED ORDER — DIPHENHYDRAMINE HCL 50 MG/ML IJ SOLN: 12.5000 mg | Freq: Four times a day (QID) | INTRAMUSCULAR | Status: DC | PRN

## 2012-07-14 MED ORDER — METOCLOPRAMIDE HCL 5 MG/ML IJ SOLN
INTRAMUSCULAR | Status: DC | PRN
  Administered 2012-07-14: 10 mg via INTRAVENOUS

## 2012-07-14 MED ORDER — ALVIMOPAN 12 MG PO CAPS
12.0000 mg | ORAL_CAPSULE | Freq: Two times a day (BID) | ORAL | Status: DC
  Administered 2012-07-15 – 2012-07-18 (×7): 12 mg via ORAL
  Filled 2012-07-14 (×10): qty 1

## 2012-07-14 MED ORDER — CHLORHEXIDINE GLUCONATE 4 % EX LIQD: 1.0000 "application " | Freq: Once | CUTANEOUS | Status: DC

## 2012-07-14 MED ORDER — GLYCOPYRROLATE 0.2 MG/ML IJ SOLN
INTRAMUSCULAR | Status: DC | PRN
  Administered 2012-07-14: 0.6 mg via INTRAVENOUS

## 2012-07-14 MED ORDER — CISATRACURIUM BESYLATE (PF) 10 MG/5ML IV SOLN
INTRAVENOUS | Status: DC | PRN
  Administered 2012-07-14 (×2): 2 mg via INTRAVENOUS
  Administered 2012-07-14: 3 mg via INTRAVENOUS
  Administered 2012-07-14: 2 mg via INTRAVENOUS

## 2012-07-14 MED ORDER — LACTATED RINGERS IR SOLN
Status: DC | PRN
  Administered 2012-07-14: 1000 mL

## 2012-07-14 MED ORDER — LACTATED RINGERS IV SOLN
INTRAVENOUS | Status: DC | PRN
  Administered 2012-07-14 (×5): via INTRAVENOUS

## 2012-07-14 MED ORDER — LIDOCAINE HCL (CARDIAC) 20 MG/ML IV SOLN
INTRAVENOUS | Status: DC | PRN
  Administered 2012-07-14: 50 mg via INTRAVENOUS

## 2012-07-14 MED ORDER — HEPARIN SODIUM (PORCINE) 5000 UNIT/ML IJ SOLN
5000.0000 [IU] | Freq: Once | INTRAMUSCULAR | Status: AC
  Administered 2012-07-14: 5000 [IU] via SUBCUTANEOUS
  Filled 2012-07-14: qty 1

## 2012-07-14 MED ORDER — KCL IN DEXTROSE-NACL 20-5-0.45 MEQ/L-%-% IV SOLN
INTRAVENOUS | Status: DC
  Administered 2012-07-14: 1000 mL via INTRAVENOUS
  Administered 2012-07-15 – 2012-07-16 (×6): via INTRAVENOUS
  Administered 2012-07-16: 1000 mL via INTRAVENOUS
  Administered 2012-07-17: 12:00:00 via INTRAVENOUS
  Administered 2012-07-17: 1000 mL via INTRAVENOUS
  Administered 2012-07-18 – 2012-07-19 (×2): via INTRAVENOUS
  Filled 2012-07-14 (×17): qty 1000

## 2012-07-14 MED ORDER — SODIUM CHLORIDE 0.9 % IJ SOLN: 9.0000 mL | INTRAMUSCULAR | Status: DC | PRN

## 2012-07-14 MED ORDER — LABETALOL HCL 5 MG/ML IV SOLN
INTRAVENOUS | Status: DC | PRN
  Administered 2012-07-14: 5 mg via INTRAVENOUS
  Administered 2012-07-14: 2.5 mg via INTRAVENOUS

## 2012-07-14 MED ORDER — FENTANYL CITRATE 0.05 MG/ML IJ SOLN
INTRAMUSCULAR | Status: DC | PRN
  Administered 2012-07-14 (×5): 50 ug via INTRAVENOUS
  Administered 2012-07-14 (×2): 100 ug via INTRAVENOUS
  Administered 2012-07-14 (×5): 50 ug via INTRAVENOUS

## 2012-07-14 MED ORDER — 0.9 % SODIUM CHLORIDE (POUR BTL) OPTIME
TOPICAL | Status: DC | PRN
  Administered 2012-07-14: 1000 mL

## 2012-07-14 MED ORDER — MUPIROCIN 2 % EX OINT
TOPICAL_OINTMENT | Freq: Once | CUTANEOUS | Status: AC
  Administered 2012-07-14: 07:00:00 via NASAL
  Filled 2012-07-14: qty 22

## 2012-07-14 MED ORDER — LACTATED RINGERS IV SOLN: INTRAVENOUS | Status: DC

## 2012-07-14 MED ORDER — HYDRALAZINE HCL 20 MG/ML IJ SOLN
INTRAMUSCULAR | Status: DC | PRN
  Administered 2012-07-14 (×3): 5 mg via INTRAVENOUS

## 2012-07-14 MED ORDER — MIDAZOLAM HCL 5 MG/5ML IJ SOLN
INTRAMUSCULAR | Status: DC | PRN
  Administered 2012-07-14: 2 mg via INTRAVENOUS

## 2012-07-14 MED ORDER — MORPHINE SULFATE (PF) 1 MG/ML IV SOLN
INTRAVENOUS | Status: DC
  Administered 2012-07-14 – 2012-07-15 (×2): via INTRAVENOUS
  Administered 2012-07-15: 4.5 mg via INTRAVENOUS
  Administered 2012-07-15: 6 mg via INTRAVENOUS
  Administered 2012-07-15: 12 mg via INTRAVENOUS
  Administered 2012-07-16: 3 mg via INTRAVENOUS
  Administered 2012-07-17: 1.5 mg via INTRAVENOUS
  Administered 2012-07-17: 10:00:00 via INTRAVENOUS
  Administered 2012-07-17: 3 mg via INTRAVENOUS
  Filled 2012-07-14 (×4): qty 25

## 2012-07-14 MED ORDER — HEPARIN SODIUM (PORCINE) 5000 UNIT/ML IJ SOLN
5000.0000 [IU] | Freq: Three times a day (TID) | INTRAMUSCULAR | Status: DC
  Administered 2012-07-14 – 2012-07-19 (×12): 5000 [IU] via SUBCUTANEOUS
  Filled 2012-07-14 (×20): qty 1

## 2012-07-14 MED ORDER — ONDANSETRON HCL 4 MG/2ML IJ SOLN
INTRAMUSCULAR | Status: DC | PRN
  Administered 2012-07-14: 4 mg via INTRAVENOUS

## 2012-07-14 MED ORDER — DEXTROSE 5 % IV SOLN
1.0000 g | Freq: Four times a day (QID) | INTRAVENOUS | Status: AC
  Administered 2012-07-14 (×2): 1 g via INTRAVENOUS
  Filled 2012-07-14 (×2): qty 1

## 2012-07-14 MED ORDER — BUPIVACAINE-EPINEPHRINE 0.25% -1:200000 IJ SOLN
INTRAMUSCULAR | Status: DC | PRN
  Administered 2012-07-14: 50 mL

## 2012-07-14 MED ORDER — ONDANSETRON HCL 4 MG/2ML IJ SOLN: 4.0000 mg | Freq: Four times a day (QID) | INTRAMUSCULAR | Status: DC | PRN

## 2012-07-14 MED ORDER — ALVIMOPAN 12 MG PO CAPS
12.0000 mg | ORAL_CAPSULE | Freq: Once | ORAL | Status: AC
  Administered 2012-07-14: 12 mg via ORAL
  Filled 2012-07-14: qty 1

## 2012-07-14 MED ORDER — HYDROMORPHONE HCL PF 1 MG/ML IJ SOLN: 0.2500 mg | INTRAMUSCULAR | Status: DC | PRN

## 2012-07-14 MED ORDER — ACETAMINOPHEN 10 MG/ML IV SOLN
INTRAVENOUS | Status: DC | PRN
  Administered 2012-07-14: 1000 mg via INTRAVENOUS

## 2012-07-14 MED ORDER — ESMOLOL HCL 10 MG/ML IV SOLN
INTRAVENOUS | Status: DC | PRN
  Administered 2012-07-14: 30 mg via INTRAVENOUS
  Administered 2012-07-14: 20 mg via INTRAVENOUS

## 2012-07-14 SURGICAL SUPPLY — 101 items
APPLIER CLIP 5 13 M/L LIGAMAX5 (MISCELLANEOUS)
APPLIER CLIP ROT 10 11.4 M/L (STAPLE)
BANDAGE ELASTIC 6 VELCRO ST LF (GAUZE/BANDAGES/DRESSINGS) IMPLANT
BENZOIN TINCTURE PRP APPL 2/3 (GAUZE/BANDAGES/DRESSINGS) ×3 IMPLANT
BLADE EXTENDED COATED 6.5IN (ELECTRODE) ×3 IMPLANT
BLADE HEX COATED 2.75 (ELECTRODE) ×6 IMPLANT
BLADE SURG 15 STRL LF DISP TIS (BLADE) IMPLANT
BLADE SURG 15 STRL SS (BLADE)
BLADE SURG SZ10 CARB STEEL (BLADE) ×6 IMPLANT
CANISTER SUCTION 2500CC (MISCELLANEOUS) IMPLANT
CELLS DAT CNTRL 66122 CELL SVR (MISCELLANEOUS) ×2 IMPLANT
CHLORAPREP W/TINT 10.5 ML (MISCELLANEOUS) ×3 IMPLANT
CLAMP ENDO BABCK 10MM (STAPLE) IMPLANT
CLEANER TIP ELECTROSURG 2X2 (MISCELLANEOUS) IMPLANT
CLIP APPLIE 5 13 M/L LIGAMAX5 (MISCELLANEOUS) IMPLANT
CLIP APPLIE ROT 10 11.4 M/L (STAPLE) IMPLANT
CLOTH BEACON ORANGE TIMEOUT ST (SAFETY) ×3 IMPLANT
CLSR STERI-STRIP ANTIMIC 1/2X4 (GAUZE/BANDAGES/DRESSINGS) ×3 IMPLANT
CONNECTOR 5 IN 1 STRAIGHT STRL (MISCELLANEOUS) IMPLANT
CORD HIGH FREQUENCY UNIPOLAR (ELECTROSURGICAL) ×3 IMPLANT
COVER MAYO STAND STRL (DRAPES) ×3 IMPLANT
COVER SURGICAL LIGHT HANDLE (MISCELLANEOUS) ×3 IMPLANT
DECANTER SPIKE VIAL GLASS SM (MISCELLANEOUS) ×3 IMPLANT
DEVICE TROCAR PUNCTURE CLOSURE (ENDOMECHANICALS) IMPLANT
DRAPE LAPAROSCOPIC ABDOMINAL (DRAPES) ×3 IMPLANT
DRAPE LAPAROTOMY TRNSV 102X78 (DRAPE) ×3 IMPLANT
DRAPE LG THREE QUARTER DISP (DRAPES) ×6 IMPLANT
DRAPE UTILITY XL STRL (DRAPES) ×6 IMPLANT
DRAPE WARM FLUID 44X44 (DRAPE) ×3 IMPLANT
DRESSING TELFA 8X3 (GAUZE/BANDAGES/DRESSINGS) ×3 IMPLANT
DRSG TEGADERM 4X4.75 (GAUZE/BANDAGES/DRESSINGS) ×3 IMPLANT
ELECT COATED BLADE 2.86 ST (ELECTRODE) ×3 IMPLANT
ELECT REM PT RETURN 9FT ADLT (ELECTROSURGICAL) ×6
ELECTRODE REM PT RTRN 9FT ADLT (ELECTROSURGICAL) ×4 IMPLANT
ENSEAL DEVICE STD TIP 35CM (ENDOMECHANICALS) IMPLANT
GAUZE SPONGE 4X4 16PLY XRAY LF (GAUZE/BANDAGES/DRESSINGS) ×3 IMPLANT
GLOVE BIOGEL PI IND STRL 7.0 (GLOVE) ×2 IMPLANT
GLOVE BIOGEL PI INDICATOR 7.0 (GLOVE) ×1
GLOVE SURG SIGNA 7.5 PF LTX (GLOVE) ×18 IMPLANT
GOWN STRL NON-REIN LRG LVL3 (GOWN DISPOSABLE) IMPLANT
GOWN STRL REIN XL XLG (GOWN DISPOSABLE) ×21 IMPLANT
HAND ACTIVATED (MISCELLANEOUS) ×3 IMPLANT
KIT BASIN OR (CUSTOM PROCEDURE TRAY) ×3 IMPLANT
LEGGING LITHOTOMY PAIR STRL (DRAPES) ×3 IMPLANT
LIGASURE IMPACT 36 18CM CVD LR (INSTRUMENTS) IMPLANT
NEEDLE HYPO 22GX1.5 SAFETY (NEEDLE) IMPLANT
NEEDLE HYPO 25X1 1.5 SAFETY (NEEDLE) IMPLANT
NS IRRIG 1000ML POUR BTL (IV SOLUTION) ×21 IMPLANT
PACK BASIC VI WITH GOWN DISP (CUSTOM PROCEDURE TRAY) IMPLANT
PEN SKIN MARKING BROAD (MISCELLANEOUS) ×3 IMPLANT
PENCIL BUTTON HOLSTER BLD 10FT (ELECTRODE) ×6 IMPLANT
RTRCTR WOUND ALEXIS 18CM MED (MISCELLANEOUS) ×3
SCISSORS LAP 5X35 DISP (ENDOMECHANICALS) IMPLANT
SET IRRIG TUBING LAPAROSCOPIC (IRRIGATION / IRRIGATOR) ×3 IMPLANT
SLEEVE Z-THREAD 5X100MM (TROCAR) IMPLANT
SOLUTION ANTI FOG 6CC (MISCELLANEOUS) ×3 IMPLANT
SPONGE GAUZE 4X4 12PLY (GAUZE/BANDAGES/DRESSINGS) ×3 IMPLANT
SPONGE LAP 18X18 X RAY DECT (DISPOSABLE) ×18 IMPLANT
SPONGE LAP 4X18 X RAY DECT (DISPOSABLE) IMPLANT
STAPLER VISISTAT 35W (STAPLE) ×3 IMPLANT
STRIP CLOSURE SKIN 1/2X4 (GAUZE/BANDAGES/DRESSINGS) IMPLANT
STRIP CLOSURE SKIN 1/4X3 (GAUZE/BANDAGES/DRESSINGS) IMPLANT
SUCTION POOLE TIP (SUCTIONS) ×3 IMPLANT
SUT NOVA 1 T20/GS 25DT (SUTURE) ×3 IMPLANT
SUT PDS AB 1 CT1 27 (SUTURE) IMPLANT
SUT PDS AB 1 CTX 36 (SUTURE) ×12 IMPLANT
SUT PDS AB 4-0 SH 27 (SUTURE) IMPLANT
SUT PROLENE 2 0 KS (SUTURE) IMPLANT
SUT SILK 2 0 (SUTURE) ×1
SUT SILK 2 0 SH CR/8 (SUTURE) ×12 IMPLANT
SUT SILK 2-0 18XBRD TIE 12 (SUTURE) ×2 IMPLANT
SUT SILK 3 0 (SUTURE) ×1
SUT SILK 3 0 SH CR/8 (SUTURE) ×3 IMPLANT
SUT SILK 3-0 18XBRD TIE 12 (SUTURE) ×2 IMPLANT
SUT VIC AB 2-0 CT1 27 (SUTURE)
SUT VIC AB 2-0 CT1 27XBRD (SUTURE) IMPLANT
SUT VIC AB 2-0 SH 18 (SUTURE) ×3 IMPLANT
SUT VIC AB 3-0 PS2 18 (SUTURE)
SUT VIC AB 3-0 PS2 18XBRD (SUTURE) IMPLANT
SUT VIC AB 3-0 SH 18 (SUTURE) ×3 IMPLANT
SUT VIC AB 4-0 SH 18 (SUTURE) IMPLANT
SUT VIC AB 5-0 P-3 18XBRD (SUTURE) ×2 IMPLANT
SUT VIC AB 5-0 P3 18 (SUTURE) ×1
SUT VIC AB 5-0 PS2 18 (SUTURE) ×3 IMPLANT
SUT VICRYL 0 UR6 27IN ABS (SUTURE) IMPLANT
SYR 30ML LL (SYRINGE) IMPLANT
SYR BULB IRRIGATION 50ML (SYRINGE) ×3 IMPLANT
SYR CONTROL 10ML LL (SYRINGE) IMPLANT
TAPE CLOTH SURG 6X10 WHT LF (GAUZE/BANDAGES/DRESSINGS) ×3 IMPLANT
TOWEL OR 17X26 10 PK STRL BLUE (TOWEL DISPOSABLE) ×9 IMPLANT
TRAY FOLEY CATH 14FRSI W/METER (CATHETERS) ×3 IMPLANT
TRAY LAP CHOLE (CUSTOM PROCEDURE TRAY) ×3 IMPLANT
TROCAR BLADELESS OPT 5 100 (ENDOMECHANICALS) ×3 IMPLANT
TROCAR HASSON GELL 12X100 (TROCAR) IMPLANT
TROCAR Z-THREAD FIOS 11X100 BL (TROCAR) IMPLANT
TROCAR Z-THREAD FIOS 5X100MM (TROCAR) ×9 IMPLANT
TROCAR Z-THREAD SLEEVE 11X100 (TROCAR) IMPLANT
TUBING CONNECTING 10 (TUBING) IMPLANT
TUBING FILTER THERMOFLATOR (ELECTROSURGICAL) ×3 IMPLANT
YANKAUER SUCT BULB TIP 10FT TU (MISCELLANEOUS) ×3 IMPLANT
YANKAUER SUCT BULB TIP NO VENT (SUCTIONS) ×3 IMPLANT

## 2012-07-14 NOTE — Anesthesia Postprocedure Evaluation (Signed)
  Anesthesia Post-op Note  Patient: Aaron Roth  Procedure(s) Performed: Procedure(s) (LRB): LAPAROSCOPIC COLOSTOMY TAKEDOWN (N/A) REMOVAL PORT-A-CATH (N/A)  Patient Location: PACU  Anesthesia Type: General  Level of Consciousness: awake and alert   Airway and Oxygen Therapy: Patient Spontanous Breathing  Post-op Pain: mild  Post-op Assessment: Post-op Vital signs reviewed, Patient's Cardiovascular Status Stable, Respiratory Function Stable, Patent Airway and No signs of Nausea or vomiting  Post-op Vital Signs: stable  Complications: No apparent anesthesia complications

## 2012-07-14 NOTE — Progress Notes (Signed)
N/G tube irrigated with 30 cc normal saline- returns dark red in color

## 2012-07-14 NOTE — Brief Op Note (Signed)
07/14/2012  2:25 PM  PATIENT:  Aaron Roth, 66 y.o., male, MRN: [id number]  PREOP DIAGNOSIS:  colon cancer  POSTOP DIAGNOSIS:   End colostomy, extensive adhesions of small bowel and omentum.  PROCEDURE:   Procedure(s):,  LAPAROSCOPIC assisted COLOSTOMY TAKEDOWN, Enterolysis (3 hours), REMOVAL PORT-A-CATH  SURGEON:   Ovidio Kin, M.D.  ASSISTANT:   Drs.  Tami Lin, and Kevin  ANESTHESIA:   general  Young Berry Flynn-Cook - CRNA Gaetano Hawthorne, MD - Anesthesiologist Elisabeth Cara, CRNA - CRNA Delphia Grates, CRNA - CRNA  General  EBL:  200  ml  BLOOD ADMINISTERED: none  DRAINS: none   LOCAL MEDICATIONS USED:   30 cc 1/4% marcaine  SPECIMEN:   Distal transverse colon and prox sigmoid colon  COUNTS CORRECT:  YES  INDICATIONS FOR PROCEDURE:  [name] is a 66 y.o. (DOB: 09-17-1946) AA male whose primary care physician is [facility], [facility] and comes for reversal of colostomy and removal of porta cath.   The indications and risks of the surgery were explained to the patient.  The risks include, but are not limited to, infection, bleeding, and nerve injury.  Note dictated to:   #[id number]

## 2012-07-14 NOTE — Interval H&P Note (Signed)
History and Physical Interval Note:  07/14/2012 8:46 AM  Aaron Roth  has presented today for surgery, with the diagnosis of colon cancer  The various methods of treatment have been discussed with the patient and family. His family is here with patient.  After consideration of risks, benefits and other options for treatment, the patient has consented to  Procedure(s) (LRB) with comments: LAPAROSCOPIC COLOSTOMY TAKEDOWN (N/A) REMOVAL PORT-A-CATH (N/A) as a surgical intervention .    The patient's history has been reviewed, patient examined, no change in status, stable for surgery.  I have reviewed the patient's chart and labs.  Questions were answered to the patient's satisfaction.     Hanan Moen H

## 2012-07-14 NOTE — H&P (View-Only) (Signed)
CENTRAL Aaron Roth  Aaron Kin, MD,  FACS [address]  [address] [place], [place]    [address] Phone:  [phone] FAX:  [phone]   Re:   Aaron Roth DOB:   1946-01-15 MRN:   [id number]  ASSESSMENT AND PLAN: 1.  Splenic flexure colon cancer.  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  Path - 4.0 cm mod differentiated adenoca, 4/15 nodes positive, KRAS positive (T3, N2), CEA - 1.2  Dr. Truett Perna has treated him with chemotx (CAPOX) for 6 months.  Dr. Truett Perna saw him 06/15/2012.  Abdominal CT scan 06/06/2012 - neg.  They did see a small nodule in the RUL lung on the chest portion.  I gave the patient copies of the report.  Colonoscopy 06/03/2012 was negative.  He has approx. 40 cm distal colon  segment.     Plan colostomy reversal date is 07/14/2012 at [facility].  I will take his porta cath out at the same time.  I reviewed with the patient colostomy reversal Roth. I discussed the role of laparoscopic and open Roth in colon Roth.  I reviewed the risks of Roth, including, but not limited to, infection, bleeding, nerve injury, anastomotic leaks, and possibility of colostomy.  I gave him his bowel prep prescription.  His son is with him.  2.  Smokes.  Has quit since initial Roth. 3.  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  Per Dr. Kalman Drape note - we can take the porta cath out at the time of colostomy reversal. 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  HISTORY OF PRESENT ILLNESS: Chief Complaint  Patient presents with  . Pre-op Exam   Aaron Roth is a 66 y.o. (DOB: Mar 08, 1946)  AA male who is a patient of [facility]) and comes to me today for follow up of an obstructing splenic flexure colon cancer and for plans to reverse his colostomy.  We went over the planned reversal.  He is accompanied with his son.  He is planning to go to [facility] to visit his sister.  But he is ready for the colostomy reversal and  understands well the planned Roth.  History of Colon Cancer: This is a patient from [facility] and presented to the [facility] with an obstructing splenic flexure colon ca while I was on call over Thanksgiving holiday.  I did a resection and end ostomy on 09/25/2011.    His final path showed a T3, N2 colon ca.  He is seeing Dr. Leonard Schwartz.Sherrill and has been treated with chemotx (CAPOX) for 6 months.  He has finished chemotx.  He has some neuropathy from the chemotx in his hands and feet, but he thinks that it is getting better.    PHYSICAL EXAM: BP 148/86  Pulse 74  Temp 97.6 F (36.4 C) (Oral)  Resp 18  Ht 5' 7.5" (1.715 m)  Wt 206 lb 6 oz (93.611 kg)  BMI 31.85 kg/m2  General: AA male who is alert and generally healthy appearing. He is in good spirits. HEENT: Normal. Pupils equal. Good dentition. Neck: Supple. No mass.  No thyroid mass.  Carotid pulse okay with no bruit. Lymph Nodes:  No supraclavicular or cervical nodes. Lungs: Clear to auscultation and symmetric breath sounds. Heart:  RRR. No murmur or rub. Abdomen: Well healed mid line incision.  Ostomy in LUQ is functioning and doing well. Extremities:  Good strength and ROM  in upper and lower extremities. Neurologic:  Still has  numbness in his hands and feet.  His hands bother him more and have been slower to improve.  DATA REVIEWED: Reviewed Dr. Kalman Drape notes.  Aaron Kin, MD, FACS Office:  [phone]

## 2012-07-14 NOTE — Transfer of Care (Signed)
Immediate Anesthesia Transfer of Care Note  Patient: Aaron Roth  Procedure(s) Performed: Procedure(s) (LRB) with comments: LAPAROSCOPIC COLOSTOMY TAKEDOWN (N/A) - laparoscopic coverted to open takedown of colostomy REMOVAL PORT-A-CATH (N/A)  Patient Location: PACU  Anesthesia Type: General  Level of Consciousness: oriented, sedated and patient cooperative  Airway & Oxygen Therapy: Patient Spontanous Breathing and Patient connected to face mask oxygen  Post-op Assessment: Report given to PACU RN, Post -op Vital signs reviewed and stable and Patient moving all extremities  Post vital signs: Reviewed and stable  Complications: No apparent anesthesia complications

## 2012-07-15 ENCOUNTER — Encounter (HOSPITAL_COMMUNITY): Payer: Self-pay | Admitting: Surgery

## 2012-07-15 LAB — CBC
HCT: 43 % (ref 39.0–52.0)
Hemoglobin: 14.7 g/dL (ref 13.0–17.0)
MCHC: 34.2 g/dL (ref 30.0–36.0)

## 2012-07-15 LAB — BASIC METABOLIC PANEL
BUN: 9 mg/dL (ref 6–23)
GFR calc non Af Amer: 67 mL/min — ABNORMAL LOW (ref >90)
Glucose, Bld: 147 mg/dL — ABNORMAL HIGH (ref 70–99)
Potassium: 4.1 mEq/L (ref 3.5–5.1)

## 2012-07-15 MED ORDER — METOPROLOL TARTRATE 1 MG/ML IV SOLN
5.0000 mg | Freq: Four times a day (QID) | INTRAVENOUS | Status: DC | PRN
  Administered 2012-07-15 – 2012-07-17 (×4): 5 mg via INTRAVENOUS
  Filled 2012-07-15 (×6): qty 5

## 2012-07-15 NOTE — Plan of Care (Signed)
Problem: Phase II Progression Outcomes Goal: Pain controlled Outcome: Progressing pca taught

## 2012-07-15 NOTE — Progress Notes (Signed)
General Surgery Note  LOS: 1 day  POD# 1  Assessment/Plan: 1.  LAPAROSCOPIC assisted COLOSTOMY TAKEDOWN, Enterolysis (3 hours), REMOVAL PORT-A-CATH - D. Anatole Apollo - 07/14/2012  Because of extensive lysis of adhesions, I expect his return of bowel function to be slow.  Will leave in step down at least one more day.  On Entereg  To ambulate.  2. Splenic flexure colon cancer.   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 Truett Perna has treated him with chemotx (CAPOX) for 6 months. 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 4.  To D/C foley 5.  DVT proph - on SQ heparin  Subjective:  Doing well.  He thinks he is more tender than the last operation.  Objective:   Filed Vitals:   07/15/12 0600  BP: 160/82  Pulse: 84  Temp:   Resp: 15     Intake/Output from previous day:  09/12 0701 - 09/13 0700 In: 6213.5 [P.O.:1; I.V.:6162.5; IV Piggyback:50] Out: 1825 [Urine:1500; Emesis/NG output:25; Blood:300]  Intake/Output this shift:      Physical Exam:   General: WN AA M who is alert and oriented.    HEENT: Normal. Pupils equal. .   Lungs: Lungs clear.  IS modest.   Abdomen: Soft, but quiet.   Wound: Dressed.  Nursing to start BID dressing changes today.   Neurologic:  Grossly intact to motor and sensory function.   Psychiatric: Has normal mood and affect.   Lab Results:    Basename 07/15/12 0315 07/12/12 1000  WBC 11.9* 7.4  HGB 14.7 15.0  HCT 43.0 44.8  PLT 264 251    BMET   Basename 07/15/12 0315 07/12/12 1000  NA 134* 140  K 4.1 4.1  CL 99 106  CO2 28 24  GLUCOSE 147* 90  BUN 9 15.0  CREATININE 1.10 1.2  CALCIUM 8.2* 9.5    PT/INR  No results found for this basename: LABPROT:2,INR:2 in the last 72 hours  ABG  No results found for this basename: PHART:2,PCO2:2,PO2:2,HCO3:2 in the last 72 hours   Studies/Results:  No results  found.   Anti-infectives:   Anti-infectives     Start     Dose/Rate Route Frequency Ordered Stop   07/14/12 1600   cefOXitin (MEFOXIN) 1 g in dextrose 5 % 50 mL IVPB        1 g 100 mL/hr over 30 Minutes Intravenous Every 6 hours 07/14/12 1459 07/14/12 2223   07/14/12 0644   cefOXitin (MEFOXIN) 2 g in dextrose 5 % 50 mL IVPB        2 g 100 mL/hr over 30 Minutes Intravenous 60 min pre-op 07/14/12 0644 07/14/12 0854          Aaron Kin, MD, FACS Pager: [phone],   [facility] Office: [phone] 07/15/2012

## 2012-07-15 NOTE — Progress Notes (Signed)
CARE MANAGEMENT NOTE 07/15/2012  Patient:  Aaron, Roth   Account Number:  [id number]  Date Initiated:  07/15/2012  Documentation initiated by:  Kaeo Jacome  Subjective/Objective Assessment:   patient in for take dwon colostomy and removal of port a cath, prolonged surg 5 hours, bp elevated and 02 dropped to 88% ,     Action/Plan:   from home   Anticipated DC Date:  07/18/2012   Anticipated DC Plan:  HOME/SELF CARE  In-house referral  NA      DC Planning Services  NA      [facility] Facility Choice  NA   Choice offered to / List presented to:  NA   DME arranged  NA      DME agency  NA     HH arranged  NA      HH agency  NA   Status of service:  In process, will continue to follow Medicare Important Message given?  NA - LOS <3 / Initial given by admissions (If response is "NO", the following Medicare IM given date fields will be blank) Date Medicare IM given:   Date Additional Medicare IM given:    Discharge Disposition:    Per UR Regulation:  Reviewed for med. necessity/level of care/duration of stay  If discussed at Long Length of Stay Meetings, dates discussed:    Comments:  [id number]/Ranika Mcniel Earlene Plater, RN, BSN, CCM: CHART REVIEWED AND UPDATED. NO DISCHARGE NEEDS PRESENT AT THIS TIME. CASE MANAGEMENT [phone]

## 2012-07-15 NOTE — Op Note (Signed)
NAME:  Aaron Roth, TEASE NO.:  [id number]  MEDICAL RECORD NO.:  [id number]  LOCATION:  1225                         FACILITY:  [facility]  PHYSICIAN:  Sandria Bales. Ezzard Standing, M.D.  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  No evidence of cancer.  PROCEDURE:  Laparoscopic-assisted colostomy takedown with end-to-end anastomosis, enterolysis of adhesions for 3 hours, removal of left subclavian PowerPort.  SURGEON:  Sandria Bales. Ezzard Standing, M.D.  FIRST ASSISTANTS: 1. Velora Heckler, MD 2. Adolph Pollack, M.D. 3. Juanetta Gosling, MD  ANESTHESIA:  General endotracheal supervised by Dr. Ronelle Nigh. Local anesthesia with 30 mL of 0.25% Marcaine.  ESTIMATED BLOOD LOSS:  200 mL.  DRAINS:  I packed his wound open and placed a Telfa in the ostomy wound.  SPECIMENS:  Distal transverse colon/colostomy and proximal sigmoid colon to Pathology.  INDICATION FOR PROCEDURE:  Mr. Zero is a 66 year old African American male and is a patient of Dr. 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  He had a primary resection with me with end-colostomy.  He had a T3,N2 colon carcinoma.  He has completed chemotherapy by Dr. Mancel Bale.  He underwent repeat CT scans and colonoscopy in August of 2013, showing no evidence of recurrent disease.  He now comes for reversal of his colostomy.  I discussed with him the indications, potential complications, and colon surgery.  Potential complications include, but are not limited to, bleeding, infection, nerve injury, anastomotic leaks.  He also has a PowerPort in his left subclavian area and was removed  at the same time.  OPERATIVE NOTE:  The patient was in room #6 at [facility], underwent a general endotracheal anesthesia supervised by Dr. Ronelle Nigh.  He was given 2 g of cefoxitin at the initiation of this procedure.  His abdomen was prepped with ChloraPrep.  He was in a lithotomy position, had a Foley catheter in place, and orogastric tube in place.  A time-out was held and surgical checklist run.  I stared out laparoscopically.  I accessed his abdominal cavity with an 5 mm Ethicon Optiview in the right upper quadrant, I placed 3 additional trocars: one 5 mm right mid abdomen, one 5 mm right lower quadrant, and a suprapubic 5-mm trocar.  I spent between 1-2 hours doing enterolysis of adhesions laparoscopically. I took down omentum against the anterior abdominal wall and small bowel against the anterior abdominal wall.  I got most these adhesions down except for one knuckle in the lower part of the incision.  Then, I made an open incision into the abdominal cavity.  I had taken most of the bowel stuck to the anterior abdominal wall laparoscopically.  So I converted to an open operation.  He still had extensive adhesions involving most of his proximal small bowel.  Much of his proximal small bowel  was stuck in the left upper quadrant and splenic flexure area of the abdominal cavity where I had removed his splenic flexure/left colon.  I was able to palpate both the right and left lobe of the liver.  There was no palpable nodule or mass within the liver.  I was able to palpate an NG tube in the stomach, and there was no palpable nodule in the upper abdomen.  I examined the pelvis and I saw no mass or nodule within the pelvis.  So he had no evidence of any recurrent or metastatic colon cancer.    His sigmoid colon I found along the old left colonic gutter and mobilized this towards the midline.  I then took down the colostomy sharply with Bovie electrocautery and knife.  It had  adequate bowel length for an anastomosis.  I removed the distal 3-4 cm in the transverse colon.  I removed the proximal 7-8 cm of the sigmoid colon and then carried out an end-to-end hand-sewn anastomosis using interrupted inverting 2-0 silk suture.  I closed the colon anastomosis witha Gambee suture anteriorly.  The anastomosis permitted at least a fingerbreadth opening of the colon.  I did not enterolyse the entire small bowel  Much of the small bowel was matted in the left upper quadrant of the abdominal cavity in  the area of where the left colon was. The colon anastomosis lay anterior to the small bowel.  This created a defect in the mesentery between the transverse colon and the sigmoid colon.  The small bowel was behind the colon anastomosis through a very large defect in the mesentery of the transverse colon anastomosis.  The defect was at least 6 or 7 cm wide.  I think the opening is big enough to not get an obstruction.  And I thought that if I tried to mobilize the small bowel I would have spent at least 2 more hours and there is the chance that I would damage the small bowel.  After I completed the colon anastomosis, Dr. Abbey Chatters broke scrub.  He did a rigid sigmoidoscopy, insufflated air to the distal sigmoid colon.  I placed the colon anastomosis under water, saw no evidence of leak.   I then took the omentum and laid this over the anastomosis and sewed in place with a 2-0 Vicryl suture.  I irrigated the abdomen out with 6 liters of saline.  He did have some bleeding up anterior to the stomach on the upper part of the abdomen that I controlled with 2-0 Vicryl sutures.  Again, I looked at the bowel that I had taken down.  I saw no evidence of any serosal bowel injury, though again he had extensive adhesions of the small bowel in the left upper quadrant towards the splenic flexure along the level of the left colonic gutter that I did not take down because I thought this would  add significant amount of time with the operation, gave a significant chance of bowel injury and he has done well with the bowel the way of the anatomy of his bowel has been.  I then closed the anterior abdominal wall with two running #1 PDS sutures.  I placed some interrupted #1 Novafil sutures.  Because he did have a 4+ hours of surgery, I decided to leave his midline wound open except I did put a couple of staples around the umbilical part of the incision and packed this with saline.  I did staple closed his colostomy, I put a  wick in the middle of this wound and then sterilely dressed the abdominal wound.  I did relook with the laparoscope and infiltrated with 0.25% Marcaine about 30 mL in the abdominal wound.  I then went to the reprepped and draped, and removed the PowerPort in the left subclavian area.  I made an incision over the port and removed it intact.  I closed it with interrupted 3-0 Vicryl sutures, the skin with a 5-0 Vicryl suture and then used Dermabond on the skin.  He tolerated the procedure well.  We were approximately 5 hours of surgery.  I will put him in step-down overnight.  I did leave an NG tube, which I positioned and checked.  He has a Foley catheter in place. He otherwise did well.   Sandria Bales. Ezzard Standing, M.D., FACS   DHN/MEDQ  D:  07/14/2012  T:  07/15/2012  Job:  [id number]  cc:   Ladene Artist, M.D. Fax: [phone]  Jeanmarie Plant, MD Fax: [phone]

## 2012-07-16 LAB — BASIC METABOLIC PANEL
BUN: 6 mg/dL (ref 6–23)
CO2: 26 mEq/L (ref 19–32)
Chloride: 99 mEq/L (ref 96–112)
Creatinine, Ser: 0.94 mg/dL (ref 0.50–1.35)
GFR calc Af Amer: >90 mL/min (ref >90)
Glucose, Bld: 122 mg/dL — ABNORMAL HIGH (ref 70–99)

## 2012-07-16 LAB — CBC WITH DIFFERENTIAL/PLATELET
Basophils Absolute: 0 10*3/uL (ref 0.0–0.1)
Basophils Relative: 0 % (ref 0–1)
Eosinophils Absolute: 0 10*3/uL (ref 0.0–0.7)
Eosinophils Relative: 0 % (ref 0–5)
HCT: 40.2 % (ref 39.0–52.0)
MCH: 29.6 pg (ref 26.0–34.0)
MCHC: 34.1 g/dL (ref 30.0–36.0)
MCV: 86.8 fL (ref 78.0–100.0)
Monocytes Absolute: 0.9 10*3/uL (ref 0.1–1.0)
RDW: 14.5 % (ref 11.5–15.5)

## 2012-07-16 NOTE — Progress Notes (Signed)
Patient ID: Aaron Roth, male   DOB: 1946-03-15, 66 y.o.   MRN: [id number] [facility] Surgery Progress Note:   2 Days Post-Op  Subjective: Mental status is clear.  Up walking about room, voided spontaneously Objective: Vital signs in last 24 hours: Temp:  [98.3 F (36.8 C)-99.2 F (37.3 C)] 98.5 F (36.9 C) (09/14 0807) Pulse Rate:  [80-93] 85  (09/14 0700) Resp:  [14-22] 16  (09/14 0807) BP: (159-192)/(70-98) 176/80 mmHg (09/14 0821) SpO2:  [91 %-99 %] 99 % (09/14 0807)  Intake/Output from previous day: 09/13 0701 - 09/14 0700 In: 3540 [I.V.:3450; NG/GT:90] Out: 2825 [Urine:2425; Emesis/NG output:400] Intake/Output this shift:    Physical Exam: Work of breathing is  Normal.  NG in place.  Wounds dressed  Lab Results:  Results for orders placed during the hospital encounter of 07/14/12 (from the past 48 hour(s))  CBC     Status: Abnormal   Collection Time   07/15/12  3:15 AM      Component Value Range Comment   WBC 11.9 (*) 4.0 - 10.5 K/uL    RBC 4.87  4.22 - 5.81 MIL/uL    Hemoglobin 14.7  13.0 - 17.0 g/dL    HCT 40.9  81.1 - 91.4 %    MCV 88.3  78.0 - 100.0 fL    MCH 30.2  26.0 - 34.0 pg    MCHC 34.2  30.0 - 36.0 g/dL    RDW 78.2  95.6 - 21.3 %    Platelets 264  150 - 400 K/uL   BASIC METABOLIC PANEL     Status: Abnormal   Collection Time   07/15/12  3:15 AM      Component Value Range Comment   Sodium 134 (*) 135 - 145 mEq/L    Potassium 4.1  3.5 - 5.1 mEq/L    Chloride 99  96 - 112 mEq/L    CO2 28  19 - 32 mEq/L    Glucose, Bld 147 (*) 70 - 99 mg/dL    BUN 9  6 - 23 mg/dL    Creatinine, Ser 0.86  0.50 - 1.35 mg/dL    Calcium 8.2 (*) 8.4 - 10.5 mg/dL    GFR calc non Af Amer 67 (*) >90 mL/min    GFR calc Af Amer 78 (*) >90 mL/min   BASIC METABOLIC PANEL     Status: Abnormal   Collection Time   07/16/12  5:30 AM      Component Value Range Comment   Sodium 135  135 - 145 mEq/L    Potassium 3.9  3.5 - 5.1 mEq/L    Chloride 99  96 - 112 mEq/L    CO2 26   19 - 32 mEq/L    Glucose, Bld 122 (*) 70 - 99 mg/dL    BUN 6  6 - 23 mg/dL    Creatinine, Ser 5.78  0.50 - 1.35 mg/dL    Calcium 8.8  8.4 - 46.9 mg/dL    GFR calc non Af Amer 84 (*) >90 mL/min    GFR calc Af Amer >90  >90 mL/min   CBC WITH DIFFERENTIAL     Status: Normal   Collection Time   07/16/12  5:30 AM      Component Value Range Comment   WBC 10.1  4.0 - 10.5 K/uL    RBC 4.63  4.22 - 5.81 MIL/uL    Hemoglobin 13.7  13.0 - 17.0 g/dL    HCT 40.2  39.0 - 52.0 %    MCV 86.8  78.0 - 100.0 fL    MCH 29.6  26.0 - 34.0 pg    MCHC 34.1  30.0 - 36.0 g/dL    RDW 16.1  09.6 - 04.5 %    Platelets 270  150 - 400 K/uL    Neutrophils Relative 73  43 - 77 %    Neutro Abs 7.4  1.7 - 7.7 K/uL    Lymphocytes Relative 17  12 - 46 %    Lymphs Abs 1.7  0.7 - 4.0 K/uL    Monocytes Relative 9  3 - 12 %    Monocytes Absolute 0.9  0.1 - 1.0 K/uL    Eosinophils Relative 0  0 - 5 %    Eosinophils Absolute 0.0  0.0 - 0.7 K/uL    Basophils Relative 0  0 - 1 %    Basophils Absolute 0.0  0.0 - 0.1 K/uL     Radiology/Results: No results found.  Anti-infectives: Anti-infectives     Start     Dose/Rate Route Frequency Ordered Stop   07/14/12 1600   cefOXitin (MEFOXIN) 1 g in dextrose 5 % 50 mL IVPB        1 g 100 mL/hr over 30 Minutes Intravenous Every 6 hours 07/14/12 1459 07/14/12 2223   07/14/12 0644   cefOXitin (MEFOXIN) 2 g in dextrose 5 % 50 mL IVPB        2 g 100 mL/hr over 30 Minutes Intravenous 60 min pre-op 07/14/12 0644 07/14/12 0854          Assessment/Plan: Problem List: Patient Active Problem List  Diagnosis  . History of colon cancer, T3, N2 (4/15 nodes), colectomy 09/25/2011.    No flatus yet.  Appears to appropriate for PD 2.  Will keep in unit for now.  2 Days Post-Op    LOS: 2 days   Matt B. Daphine Deutscher, MD, [facility] [phone] beeper [phone]  07/16/2012 9:14 AM

## 2012-07-17 DIAGNOSIS — Z933 Colostomy status: Secondary | ICD-10-CM

## 2012-07-17 HISTORY — DX: Colostomy status: Z93.3

## 2012-07-17 NOTE — Progress Notes (Signed)
Patient ID: Aaron Roth, male   DOB: 1946-09-25, 66 y.o.   MRN: [id number] [facility] Surgery Progress Note:   3 Days Post-Op  Subjective: Mental status is clear.  Transferred to 1302.  Passing gas.   Objective: Vital signs in last 24 hours: Temp:  [98.2 F (36.8 C)-100 F (37.8 C)] 98.3 F (36.8 C) (09/15 0543) Pulse Rate:  [76-92] 88  (09/15 0543) Resp:  [16-23] 16  (09/15 0837) BP: (162-191)/(83-117) 191/97 mmHg (09/15 0543) SpO2:  [96 %-100 %] 96 % (09/15 0837)  Intake/Output from previous day: 09/14 0701 - 09/15 0700 In: 1925 [I.V.:1800; NG/GT:125] Out: 1450 [Urine:1450] Intake/Output this shift:    Physical Exam: Work of breathing is normal.  + flatus.  Incisional guarding with cough  Lab Results:  Results for orders placed during the hospital encounter of 07/14/12 (from the past 48 hour(s))  BASIC METABOLIC PANEL     Status: Abnormal   Collection Time   07/16/12  5:30 AM      Component Value Range Comment   Sodium 135  135 - 145 mEq/L    Potassium 3.9  3.5 - 5.1 mEq/L    Chloride 99  96 - 112 mEq/L    CO2 26  19 - 32 mEq/L    Glucose, Bld 122 (*) 70 - 99 mg/dL    BUN 6  6 - 23 mg/dL    Creatinine, Ser 7.82  0.50 - 1.35 mg/dL    Calcium 8.8  8.4 - 95.6 mg/dL    GFR calc non Af Amer 84 (*) >90 mL/min    GFR calc Af Amer >90  >90 mL/min   CBC WITH DIFFERENTIAL     Status: Normal   Collection Time   07/16/12  5:30 AM      Component Value Range Comment   WBC 10.1  4.0 - 10.5 K/uL    RBC 4.63  4.22 - 5.81 MIL/uL    Hemoglobin 13.7  13.0 - 17.0 g/dL    HCT 21.3  08.6 - 57.8 %    MCV 86.8  78.0 - 100.0 fL    MCH 29.6  26.0 - 34.0 pg    MCHC 34.1  30.0 - 36.0 g/dL    RDW 46.9  62.9 - 52.8 %    Platelets 270  150 - 400 K/uL    Neutrophils Relative 73  43 - 77 %    Neutro Abs 7.4  1.7 - 7.7 K/uL    Lymphocytes Relative 17  12 - 46 %    Lymphs Abs 1.7  0.7 - 4.0 K/uL    Monocytes Relative 9  3 - 12 %    Monocytes Absolute 0.9  0.1 - 1.0 K/uL    Eosinophils  Relative 0  0 - 5 %    Eosinophils Absolute 0.0  0.0 - 0.7 K/uL    Basophils Relative 0  0 - 1 %    Basophils Absolute 0.0  0.0 - 0.1 K/uL     Radiology/Results: No results found.  Anti-infectives: Anti-infectives     Start     Dose/Rate Route Frequency Ordered Stop   07/14/12 1600   cefOXitin (MEFOXIN) 1 g in dextrose 5 % 50 mL IVPB        1 g 100 mL/hr over 30 Minutes Intravenous Every 6 hours 07/14/12 1459 07/14/12 2223   07/14/12 0644   cefOXitin (MEFOXIN) 2 g in dextrose 5 % 50 mL IVPB  2 g 100 mL/hr over 30 Minutes Intravenous 60 min pre-op 07/14/12 0644 07/14/12 0854          Assessment/Plan: Problem List: Patient Active Problem List  Diagnosis  . History of colon cancer, T3, N2 (4/15 nodes), colectomy 09/25/2011.    Post difficult colostomy closure.  Flatus.  Will discontinue NG but keep NPO Check CBC in am 3 Days Post-Op    LOS: 3 days   Matt B. Daphine Deutscher, MD, [facility] [phone] beeper [phone]  07/17/2012 9:36 AM

## 2012-07-18 LAB — CBC
MCH: 30.1 pg (ref 26.0–34.0)
MCHC: 35 g/dL (ref 30.0–36.0)
RDW: 14.1 % (ref 11.5–15.5)

## 2012-07-18 MED ORDER — HYDROCODONE-ACETAMINOPHEN 5-325 MG PO TABS: 1.0000 | ORAL_TABLET | ORAL | Status: DC | PRN

## 2012-07-18 NOTE — Progress Notes (Signed)
General Surgery Note  LOS: 4 days  POD# 4  Assessment/Plan: 1.  LAPAROSCOPIC assisted COLOSTOMY TAKEDOWN, Enterolysis (3 hours), REMOVAL PORT-A-CATH - D. Everlean Bucher - 07/14/2012  Having BM's, will advance diet.  To shower.  Progressing well.  2. Splenic flexure colon cancer.   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 Truett Perna has treated him with chemotx (CAPOX) for 6 months. 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 4.  To D/C foley - has tolerated foley out 5.  DVT proph - on SQ heparin  Subjective:  Doing well.  Had three BM's last 24 hours. No nausea or complaints.  Objective:   Filed Vitals:   07/18/12 0000  BP:   Pulse:   Temp:   Resp: 16     Intake/Output from previous day:  09/15 0701 - 09/16 0700 In: 0  Out: 600 [Urine:600]  Intake/Output this shift:      Physical Exam:   General: WN AA M who is alert and oriented.    HEENT: Normal. Pupils equal. .   Lungs: Lungs clear.     Abdomen: Soft.  Has few BS.   Wound: Clean.  Wick from ostomy was not removed.  But midline wound is actually sealing together.     Lab Results:     Basename 07/18/12 0400 07/16/12 0530  WBC 8.0 10.1  HGB 13.1 13.7  HCT 37.4* 40.2  PLT 313 270    BMET    Basename 07/16/12 0530  NA 135  K 3.9  CL 99  CO2 26  GLUCOSE 122*  BUN 6  CREATININE 0.94  CALCIUM 8.8    PT/INR  No results found for this basename: LABPROT:2,INR:2 in the last 72 hours  ABG  No results found for this basename: PHART:2,PCO2:2,PO2:2,HCO3:2 in the last 72 hours   Studies/Results:  No results found.   Anti-infectives:   Anti-infectives     Start     Dose/Rate Route Frequency Ordered Stop   07/14/12 1600   cefOXitin (MEFOXIN) 1 g in dextrose 5 % 50 mL IVPB        1 g 100 mL/hr over 30 Minutes Intravenous Every 6 hours 07/14/12 1459 07/14/12 2223   07/14/12 0644   cefOXitin  (MEFOXIN) 2 g in dextrose 5 % 50 mL IVPB        2 g 100 mL/hr over 30 Minutes Intravenous 60 min pre-op 07/14/12 0644 07/14/12 0854          Ovidio Kin, MD, FACS Pager: [phone],   [facility] Office: [phone] 07/18/2012

## 2012-07-19 MED ORDER — SODIUM CHLORIDE 0.9 % IV SOLN: 250.0000 mL | INTRAVENOUS | Status: DC | PRN

## 2012-07-19 MED ORDER — SODIUM CHLORIDE 0.9 % IJ SOLN
3.0000 mL | Freq: Two times a day (BID) | INTRAMUSCULAR | Status: DC
  Administered 2012-07-19: 3 mL via INTRAVENOUS

## 2012-07-19 MED ORDER — SODIUM CHLORIDE 0.9 % IJ SOLN: 3.0000 mL | INTRAMUSCULAR | Status: DC | PRN

## 2012-07-19 NOTE — Progress Notes (Signed)
Patient's BP elevated with morning vitals: BP 189/82. HR 111. Offered the patient PRN metoprolol but the patient refused. Notified the MD of this as well as refusal of morning lab draw and heparin shot.

## 2012-07-19 NOTE — Progress Notes (Signed)
General Surgery Note  LOS: 5 days  POD# 5  Assessment/Plan: 1.  LAPAROSCOPIC assisted COLOSTOMY TAKEDOWN, Enterolysis (3 hours), REMOVAL PORT-A-CATH - D. Kaiah Hosea - 07/14/2012  Tolerated clear liquids.  Had another BM.  To advance to reg diet.  2. Splenic flexure colon cancer.   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 Truett Perna has treated him with chemotx (CAPOX) for 6 months. 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 4.  DVT proph - on SQ heparin  Subjective:  Doing well.  Tolerated liquids.  Walking a lot.  Objective:   Filed Vitals:   07/19/12 0616  BP: 189/82  Pulse: 111  Temp: 99 F (37.2 C)  Resp: 18     Intake/Output from previous day:  09/16 0701 - 09/17 0700 In: 1209 [P.O.:240; I.V.:969] Out: -   Intake/Output this shift:      Physical Exam:   General: WN AA M who is alert and oriented.    HEENT: Normal. Pupils equal. .   Lungs: Lungs clear.  Good inspiratory effort.   Abdomen: Soft.  Has few BS.   Wound: Clean.  Dressing dry.   Extremities:  IV in right hand.  Complains of "catch" in left shoulder, but decrease in ROM.     Lab Results:     Basename 07/18/12 0400  WBC 8.0  HGB 13.1  HCT 37.4*  PLT 313    BMET   No results found for this basename: NA:2,K:2,CL:2,CO2:2,GLUCOSE:2,BUN:2,CREATININE:2,CALCIUM:2 in the last 72 hours  PT/INR  No results found for this basename: LABPROT:2,INR:2 in the last 72 hours  ABG  No results found for this basename: PHART:2,PCO2:2,PO2:2,HCO3:2 in the last 72 hours   Studies/Results:  No results found.   Anti-infectives:   Anti-infectives     Start     Dose/Rate Route Frequency Ordered Stop   07/14/12 1600   cefOXitin (MEFOXIN) 1 g in dextrose 5 % 50 mL IVPB        1 g 100 mL/hr over 30 Minutes Intravenous Every 6 hours 07/14/12 1459 07/14/12 2223   07/14/12 0644   cefOXitin (MEFOXIN) 2 g  in dextrose 5 % 50 mL IVPB        2 g 100 mL/hr over 30 Minutes Intravenous 60 min pre-op 07/14/12 0644 07/14/12 0854          Ovidio Kin, MD, FACS Pager: [phone],   [facility]: [phone] 07/19/2012

## 2012-07-20 MED ORDER — HYDROCODONE-ACETAMINOPHEN 5-325 MG PO TABS: 1.0000 | ORAL_TABLET | Freq: Four times a day (QID) | ORAL | Status: DC | PRN

## 2012-07-20 NOTE — Progress Notes (Signed)
Patient refused his vital signs being done at this time and requested that they be done later. Will let the oncoming nurse know.

## 2012-07-20 NOTE — Progress Notes (Signed)
Pt discharged home with family in stable condition. Discharge instructions and script given to patient. Pt verbalized understanding

## 2012-07-20 NOTE — Progress Notes (Signed)
General Surgery Note  LOS: 6 days  POD# 6  Assessment/Plan: 1.  LAPAROSCOPIC assisted COLOSTOMY TAKEDOWN, Enterolysis (3 hours), REMOVAL PORT-A-CATH - D. Koby Pickup - 07/14/2012  Has done well.  D/C instructions reviewed.  Home today.  2. Splenic flexure colon cancer.   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 Truett Perna has treated him with chemotx (CAPOX) for 6 months. 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 4.  DVT proph - on SQ heparin  Subjective:  Tolerated reg diet.  Ready to go home.  Objective:   Filed Vitals:   07/19/12 2135  BP: 158/78  Pulse: 85  Resp: 17     Intake/Output from previous day:  09/17 0701 - 09/18 0700 In: 1070 [P.O.:1070] Out: -   Intake/Output this shift:      Physical Exam:   General: WN AA M who is alert and oriented.    HEENT: Normal. Pupils equal. .   Lungs: Lungs clear.  Good inspiratory effort.   Abdomen: Soft.   BS present.   Wound: Clean.  Superficial separation of the midline wound, but clean.  The other wounds look good.  To remove some staple prior to discharge.       Lab Results:     Basename 07/18/12 0400  WBC 8.0  HGB 13.1  HCT 37.4*  PLT 313    BMET   No results found for this basename: NA:2,K:2,CL:2,CO2:2,GLUCOSE:2,BUN:2,CREATININE:2,CALCIUM:2 in the last 72 hours  PT/INR  No results found for this basename: LABPROT:2,INR:2 in the last 72 hours  ABG  No results found for this basename: PHART:2,PCO2:2,PO2:2,HCO3:2 in the last 72 hours   Studies/Results:  No results found.   Anti-infectives:   Anti-infectives     Start     Dose/Rate Route Frequency Ordered Stop   07/14/12 1600   cefOXitin (MEFOXIN) 1 g in dextrose 5 % 50 mL IVPB        1 g 100 mL/hr over 30 Minutes Intravenous Every 6 hours 07/14/12 1459 07/14/12 2223   07/14/12 0644   cefOXitin (MEFOXIN) 2 g in dextrose 5 % 50 mL IVPB        2 g 100 mL/hr over 30 Minutes Intravenous 60 min pre-op 07/14/12 0644 07/14/12 0854          Ovidio Kin, MD, FACS Pager: [phone],   [facility]: [phone] 07/20/2012

## 2012-07-20 NOTE — Progress Notes (Signed)
Patient refused saline flush for IV and heparin shot at 2200. Patient stated he understood the reasons for both but did not want them.

## 2012-07-21 NOTE — Discharge Summary (Signed)
NAME:  Aaron Roth, Aaron Roth NO.:  [id number]  MEDICAL RECORD NO.:  [id number]  LOCATION:  1302                         FACILITY:  [facility]  PHYSICIAN:  Aaron Roth, M.D.  DATE OF BIRTH:  [birth year]  DATE OF ADMISSION:  07/14/2012 DATE OF DISCHARGE:  07/20/2012                              DISCHARGE SUMMARY   DISCHARGE DIAGNOSES: 1. History of colostomy. 2. Splenic flexure colon carcinoma T3, N2, status post treatment by Dr. Rolm Roth with chemotherapy. 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 4. Left subclavian PowerPort. 5. Extensive intra-abdominal adhesions.  OPERATIONS PERFORMED:  The patient underwent a laparoscopic-assisted colostomy takedown with the end-to-end anastomosis, enterolysis of adhesions, removal of left subclavian PowerPort on 07/14/2012 by Dr. Algis Roth. Aaron Roth  HISTORY OF PRESENT ILLNESS:  Aaron Roth presented to the [facility] Emergency Room in November 2012, with obstructing splenic flexure colon carcinoma.  He underwent extended left hemicolectomy with end colostomy by Dr. Ovidio Roth on September 25, 2011.  He did well with the surgery. His final pathology showed a T3 N2 colon carcinoma, with 4 of 15 nodes positive, and KRAS positive.  He was T3 N2.  He underwent 6 months of treatment by Dr. Mancel Roth, with CAPOX.  When he completed his chemotherapy, he was interested in reversing his colostomy.  He underwent an abdominal CT scan on June 06, 2012, which showed no evidence of recurrent cancer.  He underwent a colonoscopy on June 03, 2012, which was negative, with approximately 40 cm of distal colon.  His comorbid problems on prior hospitalization, he did smoke but quit since his initial surgery a year ago.  He had a neuropathy from chemotherapy.  He also has a PowerPort removed at the same time.  HOSPITAL COURSE:  He was taken to the operating room on the day of admission where he underwent a laparoscopic-assisted colostomy reversal. I spent some 3  hours doing enterolysis of adhesions, and I removed his Port-A-Cath.  Postoperatively, I did leave an NG tube in for 3 days.  I placed him in the step-down ICU, but he rapidly improved.  His Foley was removed on the first postoperative day.  I did leave NG tube in for 3 Days until he started passing gas.  His diet was then advanced.  His hemoglobin from July 18, 2012, was 13,100, his white blood count was 8,100.  He was doing well with his bowel movements.  He did well with pain control and ambulation.  I did send the resected segments of colon to Pathology, which proved to be benign.  He is now 6 days postop, and he is ready for discharge.  His discharge instructions will include: He can drive within 3 or 4 days if he is doing well.   He should not do heavy lifting for about 15 pounds for about 4 weeks.  He should shower once or twice a day.   He does have a little bit of separation of his wound, but I left this wound open.  My plan was actually to do a delayed primary closure but the incision stuck together, so well basically did his own delayed primary closure.   His diet is as tolerated.  He is to see me back in 1-2 weeks.   We took out the small staples, but I left the staples around his ostomies.  I will remove those today in the office and check his wound.   He is to call for any interval problems.   Aaron Roth, M.D., FACS   DHN/MEDQ  D:  07/20/2012  T:  07/21/2012  Job:  [id number]  cc:   Aaron Roth, M.D. Fax: [phone]  Aaron Plant, MD Fax: [phone]

## 2012-07-29 ENCOUNTER — Ambulatory Visit (INDEPENDENT_AMBULATORY_CARE_PROVIDER_SITE_OTHER): Payer: Medicare Other | Admitting: Surgery

## 2012-07-29 ENCOUNTER — Encounter (INDEPENDENT_AMBULATORY_CARE_PROVIDER_SITE_OTHER): Payer: Self-pay | Admitting: Surgery

## 2012-07-29 VITALS — BP 156/86 | HR 76 | Temp 99.0°F | Resp 16 | Ht 67.5 in | Wt 201.0 lb

## 2012-07-29 DIAGNOSIS — Z85038 Personal history of other malignant neoplasm of large intestine: Secondary | ICD-10-CM

## 2012-07-29 DIAGNOSIS — Z933 Colostomy status: Secondary | ICD-10-CM

## 2012-07-29 NOTE — Progress Notes (Signed)
[facility]  Ovidio Kin, MD,  FACS [address]  [address] [place], [place]    [address] Phone:  [phone] FAX:  [phone]   Re:   ROSHARD REZABEK DOB:   1946/09/19 MRN:   [id number]  ASSESSMENT AND PLAN: 1.  Splenic flexure colon cancer.  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  Path - 4.0 cm mod differentiated adenoca, 4/15 nodes positive, KRAS positive (T3, N2), CEA - 1.2  Dr. Truett Perna has treated him with chemotx (CAPOX) for 6 months.  Dr. Truett Perna saw him 06/15/2012.  Abdominal CT scan 06/06/2012 - neg.  They did see a small nodule in the RUL lung on the chest portion.  I gave the patient copies of the report.  Colonoscopy 06/03/2012 was negative.  He has done well from his colostomy reversal.    I will see him back in 6 months.   1a. Colostomy reversal - 07/14/2012    2.  Smokes.  Has quit since initial surgery. 3.  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  Removed 07/14/2012. 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  HISTORY OF PRESENT ILLNESS: Chief Complaint  Patient presents with  . Routine Post Op    PO ostomy reversal ; staple removal   Aaron Roth is a 66 y.o. (DOB: 08-Jun-1946)  AA male who is a patient of [facility], MD [facility]) and comes to me today for follow up of colostomy reversal.  He has done well.  He tried to take out his own staples, but did not get far.  He said he has met 27 people with cancer since this spring and they are all disease free.  He also had a friend who had a colostomy for 10 years and just got it reversed about the same time he got his reversed.  He has no concerns.  History of Colon Cancer: This is a patient from [facility] and presented to the [facility] with an obstructing splenic flexure colon ca while I was on call over Thanksgiving holiday.  I did a resection and end ostomy on 09/25/2011.    His final path showed a T3, N2 colon ca.  He is seeing Dr. Leonard Schwartz.Sherrill and has been  treated with chemotx (CAPOX) for 6 months.  He has finished chemotx.   He has some neuropathy from the chemotx in his hands and feet, but he thinks that it is getting better.    PHYSICAL EXAM: BP 156/86  Pulse 76  Temp 99 F (37.2 C) (Temporal)  Resp 16  Ht 5' 7.5" (1.715 m)  Wt 201 lb (91.173 kg)  BMI 31.02 kg/m2  General: AA male who is alert and generally healthy appearing. He is in good spirits. Abdomen: Well healed mid line and ostomy incision.  I removed the staples from his ostomy incision.  DATA REVIEWED: Path to patient.  Ovidio Kin, MD, FACS Office:  [phone]

## 2012-08-15 ENCOUNTER — Encounter: Payer: Self-pay | Admitting: *Deleted

## 2012-08-15 NOTE — Progress Notes (Signed)
Per Darl Pikes, RN with Dr. Truett Perna, patient aware to contact surgeon about requesting a statement of treatment to get a dismissal of seatbelt ticket.  Patient verbalized understanding.

## 2012-09-15 ENCOUNTER — Ambulatory Visit: Payer: Medicare Other | Admitting: Lab

## 2012-09-15 ENCOUNTER — Ambulatory Visit: Payer: Medicare Other | Admitting: Nurse Practitioner

## 2013-01-16 ENCOUNTER — Telehealth (INDEPENDENT_AMBULATORY_CARE_PROVIDER_SITE_OTHER): Payer: Self-pay

## 2013-01-16 NOTE — Telephone Encounter (Signed)
LTF Appt 02/22/13@10 :15 with Dr. Ezzard Standing Pt is to call to confirm

## 2013-02-22 ENCOUNTER — Ambulatory Visit (INDEPENDENT_AMBULATORY_CARE_PROVIDER_SITE_OTHER): Payer: Medicare Other | Admitting: Surgery

## 2013-02-22 ENCOUNTER — Other Ambulatory Visit (INDEPENDENT_AMBULATORY_CARE_PROVIDER_SITE_OTHER): Payer: Self-pay

## 2013-02-22 VITALS — BP 140/88 | HR 82 | Temp 98.2°F | Resp 18 | Ht 70.0 in | Wt 210.0 lb

## 2013-02-22 DIAGNOSIS — Z85038 Personal history of other malignant neoplasm of large intestine: Secondary | ICD-10-CM

## 2013-02-22 DIAGNOSIS — D126 Benign neoplasm of colon, unspecified: Secondary | ICD-10-CM

## 2013-02-22 NOTE — Progress Notes (Signed)
[facility]  Ovidio Kin, MD,  FACS [address] [place]  [address] [place], [place]    [address] Phone:  [phone] FAX:  [phone]   Re:   Aaron Roth DOB:   Jan 07, 1946 MRN:   [id number]  ASSESSMENT AND PLAN: 1.  Splenic flexure colon cancer.  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  Path - 4.0 cm mod differentiated adenoca, 4/15 nodes positive, KRAS positive (T3, N2), CEA - 1.2  Dr. Truett Perna has treated him with chemotx (CAPOX) for 6 months.  He thinks he is to see Dr. Truett Perna in August 2014.  Abdominal CT scan 06/06/2012 - neg.  They did see a small nodule in the RUL lung on the chest portion.  This nodule was stable from a prior CT in 09/2011.  The follow up is unclear and he will discuss this with Dr. Truett Perna. His last CEA was peri-operative, so we will check this.  He may have this blood drawn in [facility].  Colonoscopy 06/03/2012 was negative, plan repeat at one year from colostomy closure - fall 2014.  Disease free  I will see him back in 6 months.   1a. Colostomy reversal - 07/14/2012    2.  Smokes.  He quit for a while, but now he is back smoking.  We talked about this. 3.  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  Removed 07/14/2012. 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 5.  He talked a little about how he still owes me money for the surgery.  He is talking to social security and others.  HISTORY OF PRESENT ILLNESS: Chief Complaint  Patient presents with  . Follow-up    colon ca   Aaron Roth is a 67 y.o. (DOB: [birth year])  AA male who is a patient of [facility], [facility]) and comes to me today for follow up of a splenic flexure colon cancer.  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 Clarene Duke will end up.  Dr. Vernard Gambles wife is in politics.  He has done well.  He has no concerns.  We reviewed follow up.  Plan to update his CEA.  He will discuss with Dr. Truett Perna  his CT follow up.  And I will plan to repeat his colonoscopy about 1 year from his ostomy reversal.  He's also   History of Colon Cancer: This is a patient from [facility] and presented to the [facility] with an obstructing splenic flexure colon ca while I was on call over Thanksgiving holiday.  I did a resection and end ostomy on 09/25/2011.    His final path showed a T3, N2 colon ca.  He is seeing Dr. Leonard Schwartz.Sherrill and has been treated with chemotx (CAPOX) for 6 months.  He has finished chemotx.   He has some neuropathy from the chemotx in his hands and feet, but he thinks that it is getting better.    PHYSICAL EXAM: BP 140/88  Pulse 82  Temp(Src) 98.2 F (36.8 C)  Resp 18  Ht 5\' 10"  (1.778 m)  Wt 210 lb (95.255 kg)  BMI 30.13 kg/m2  General: AA M who is alert and generally healthy appearing.  HEENT: Normal. Pupils equal. Good dentition. Neck: Supple. No mass.  No thyroid mass.  Carotid pulse okay with no bruit. Lymph Nodes:  No supraclavicular or cervical nodes. Lungs: Clear to auscultation and symmetric breath sounds. Heart:  RRR. No  murmur or rub. Abdomen: Soft. No mass. No tenderness. No hernia. Normal bowel sounds.  Well healed mid line and ostomy incision.  He occasionally feels a funny feeling in his incisions. Rectal: Not done. Extremities:  Good strength and ROM  in upper and lower extremities. Neurologic:  He still has some numbness at his finger tips. Psychiatric: Has normal mood and affect. Behavior is normal.   DATA REVIEWED: No new data.  Ovidio Kin, MD, FACS Office:  [phone]

## 2013-07-17 IMAGING — CR DG CHEST 1V PORT
1 series · 1 of 1 positions shown · non-contrast
Comparison: None.

CLINICAL DATA: Cord insertion.

PORTABLE CHEST - 1 VIEW

[view not recorded]
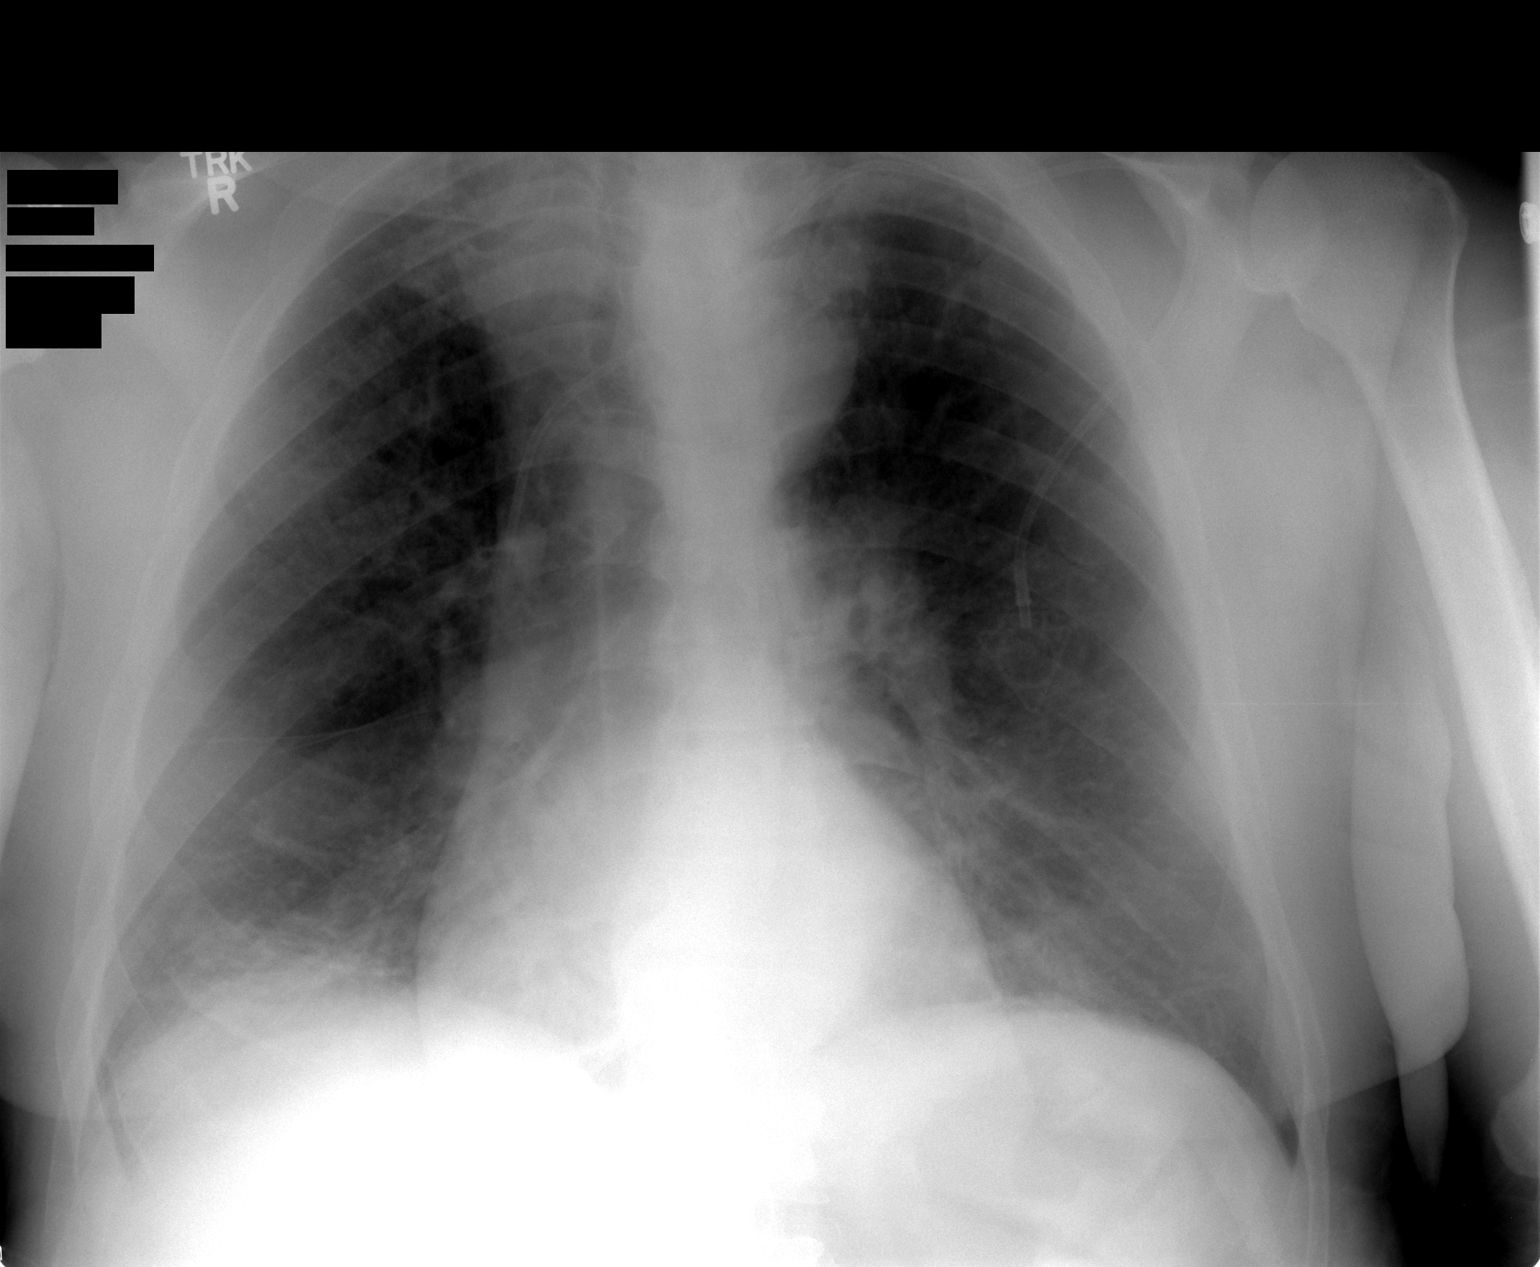

[1 of 1 positions shown; findings below may reference images not displayed]

FINDINGS: Left port is in place.  The tip is in the SVC.  No
pneumothorax.  Right basilar atelectasis or infiltrate.  Heart is
normal size.  No confluent opacity on the left.  No effusions.
IMPRESSION: Left port tip in the SVC without pneumothorax.

Right basilar atelectasis or infiltrate.

## 2013-09-13 ENCOUNTER — Telehealth (INDEPENDENT_AMBULATORY_CARE_PROVIDER_SITE_OTHER): Payer: Self-pay

## 2013-09-13 ENCOUNTER — Ambulatory Visit (INDEPENDENT_AMBULATORY_CARE_PROVIDER_SITE_OTHER): Payer: Medicare Other | Admitting: Surgery

## 2013-09-13 NOTE — Telephone Encounter (Signed)
F/U call missed appointment; Patient states he is doing good, he is out of town and he will call when he gets back.

## 2013-09-15 ENCOUNTER — Encounter (INDEPENDENT_AMBULATORY_CARE_PROVIDER_SITE_OTHER): Payer: Self-pay | Admitting: Surgery

## 2014-02-11 IMAGING — CT CT CHEST W/ CM
2 of 5 series · 17 of 46 positions shown, 19 images · IV contrast (omnipaque)
Comparison: CT 09/24/2011

CT CHEST

CLINICAL DATA: Restaging colon carcinoma

CT CHEST, ABDOMEN AND PELVIS WITH CONTRAST
TECHNIQUE: Multidetector CT imaging of the chest, abdomen and
pelvis was performed following the standard protocol during bolus
administration of intravenous contrast.
Contrast: 100mL OMNIPAQUE IOHEXOL 300 MG/ML  SOLN

[Series 2: cap with st · axial · 0.73mm/px · z∈[-584,-24]mm · 14 of 124 slices shown, 16 images]
[im 6/124  soft-tissue]
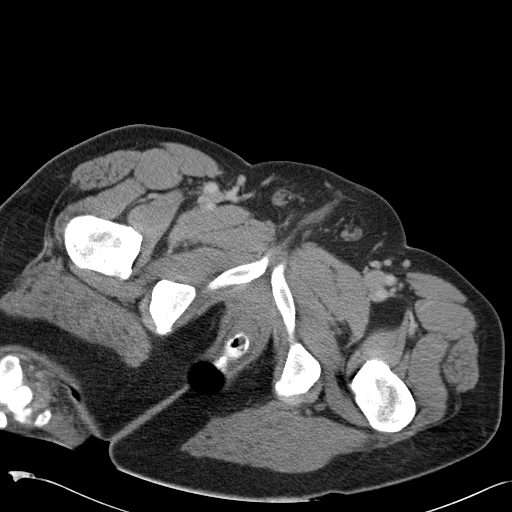
[im 6/124  bone]
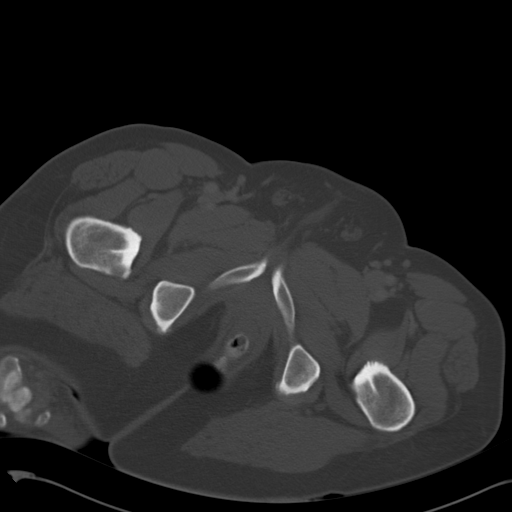
[im 17/124  soft-tissue]
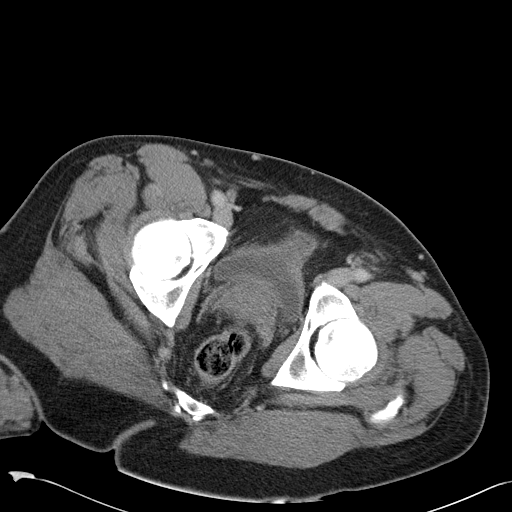
[im 23/124  soft-tissue]
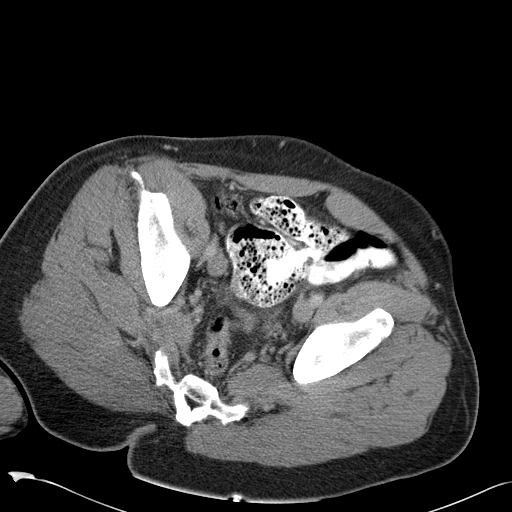
[im 34/124  soft-tissue]
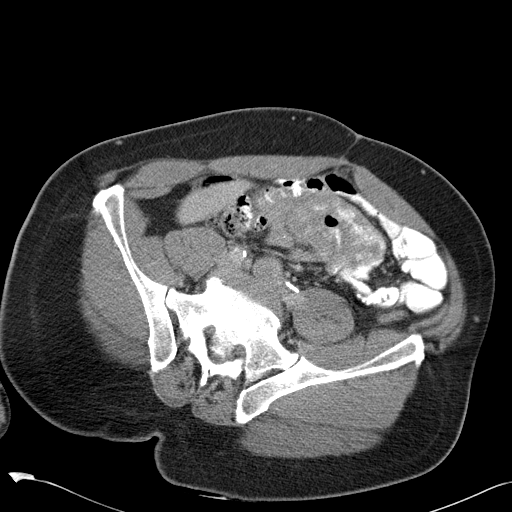
[im 40/124  soft-tissue]
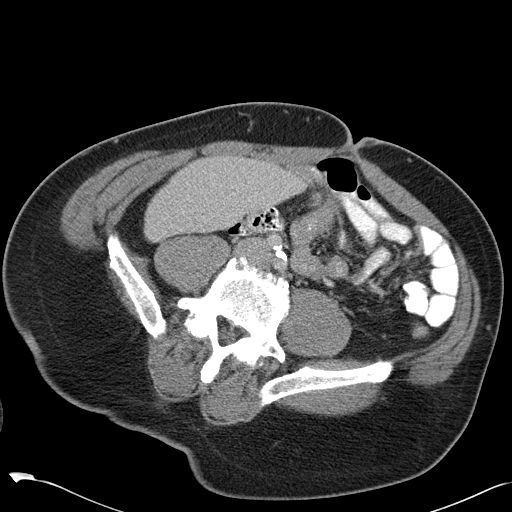
[im 51/124  soft-tissue]
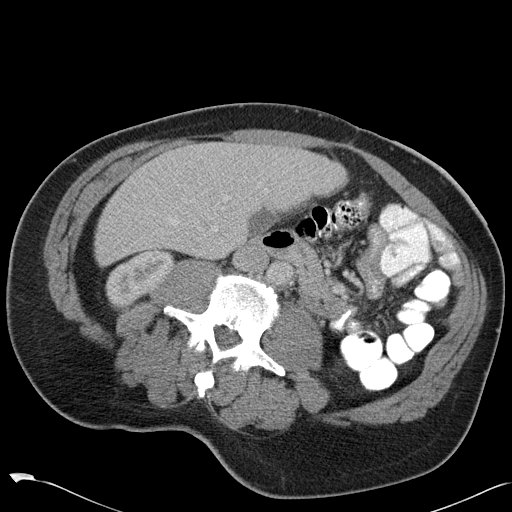
[im 56/124  soft-tissue]
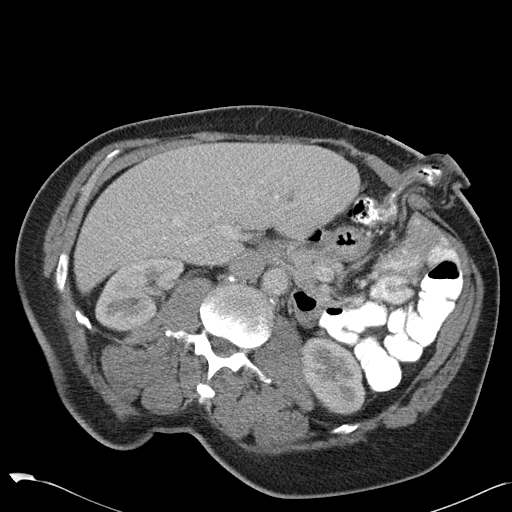
[im 68/124  soft-tissue]
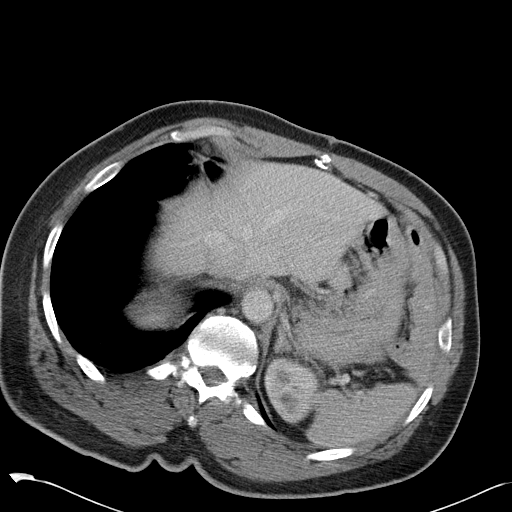
[im 73/124  soft-tissue]
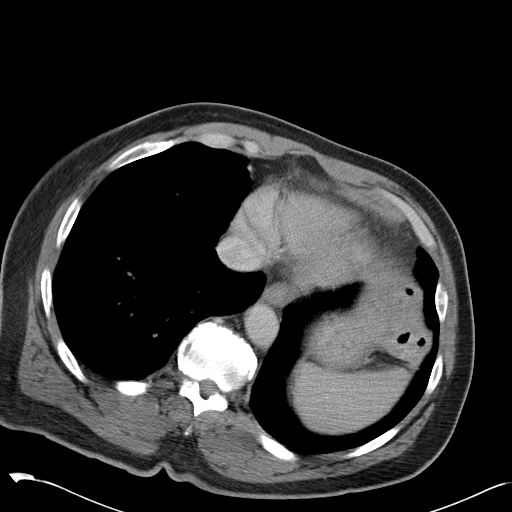
[im 73/124  bone]
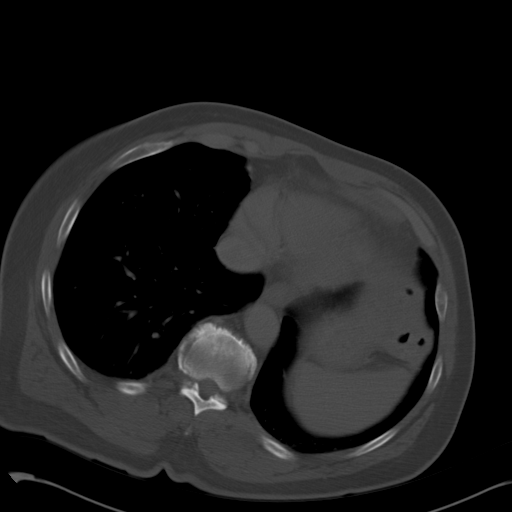
[im 84/124  soft-tissue]
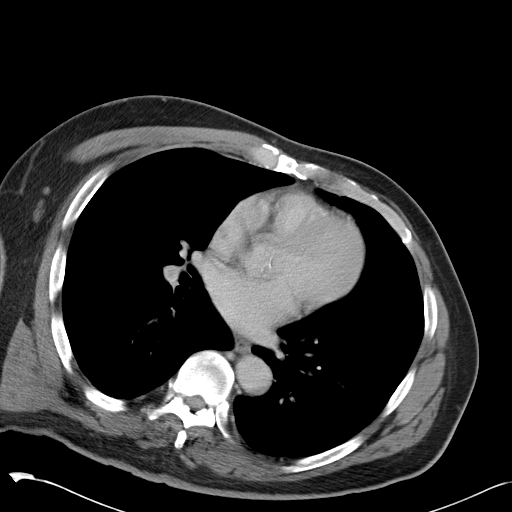
[im 90/124  soft-tissue]
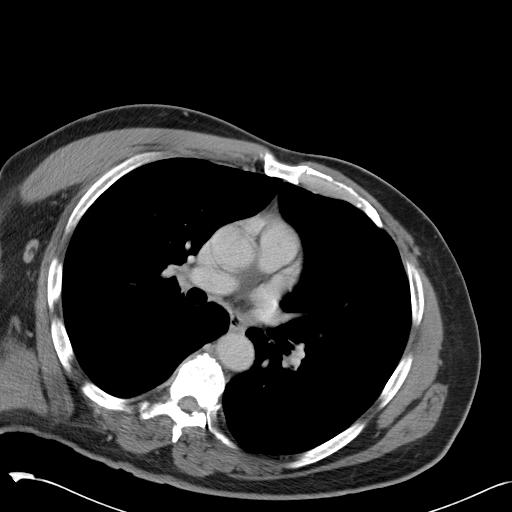
[im 101/124  soft-tissue]
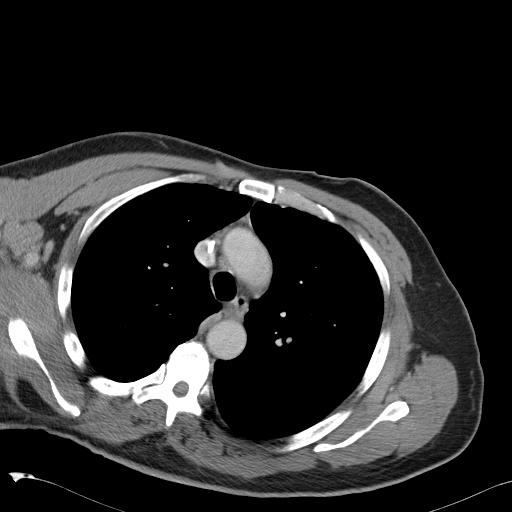
[im 107/124  soft-tissue]
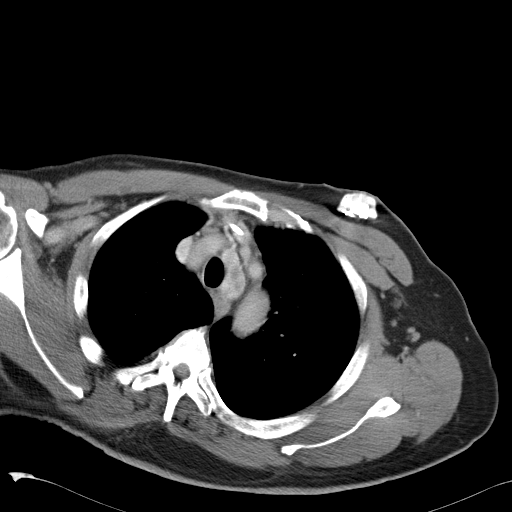
[im 118/124  soft-tissue]
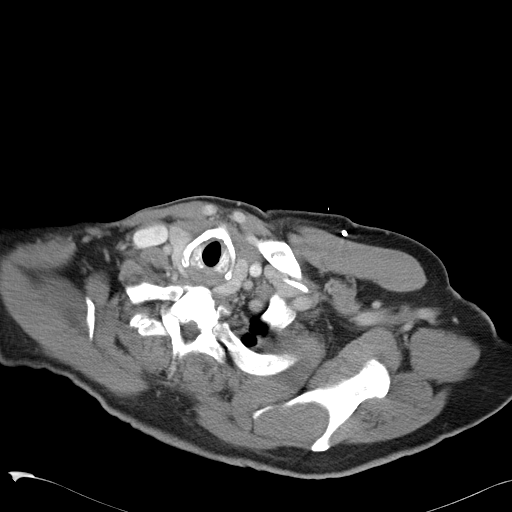

[Series 602: <mpr thick range> · coronal · 1.21mm/px · 3 of 98 slices shown]
[im 33/98  soft-tissue]
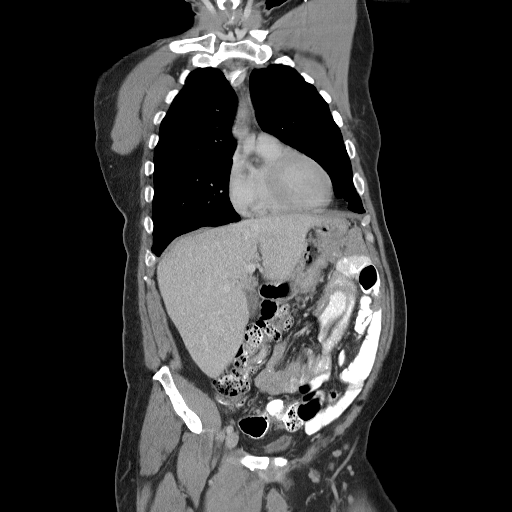
[im 44/98  soft-tissue]
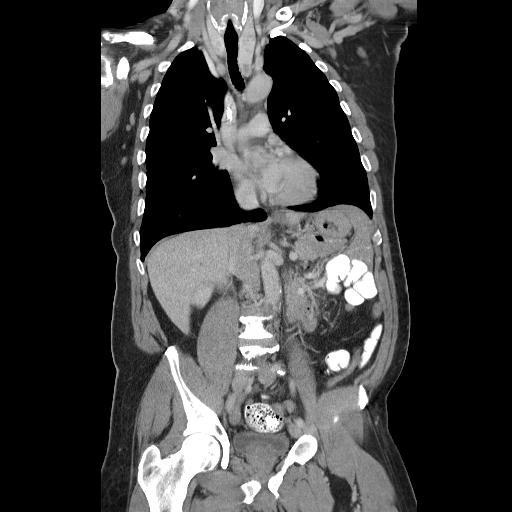
[im 54/98  soft-tissue]
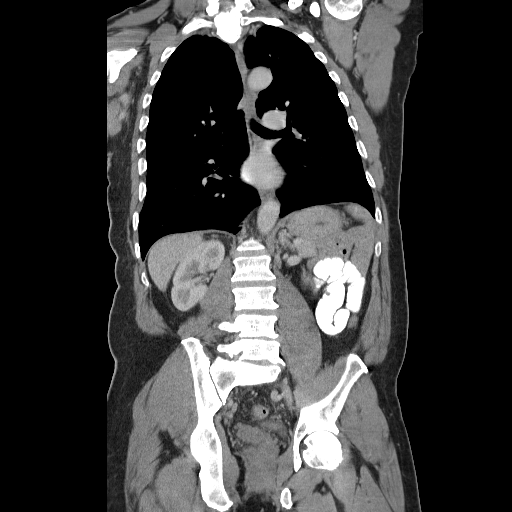

[17 of 46 positions shown; findings below may reference images not displayed]

FINDINGS: There is a port in the left anterior chest wall.  There
is small rounded left axillary lymph nodes are not pathologic by
size criteria.  Thyroid gland is enlarged with multiple low-density
nodules measuring 10 mm or less.  No mediastinal adenopathy.  No
pericardial fluid.  Esophagus is normal.

Review of the lung parenchyma demonstrates a 4 mm nodule in the
inferior aspect of the left upper lobe (image 37) which is similar
to 3 mm on prior.
IMPRESSION: 1..  No evidence of thoracic metastasis.  T

2.  Small right upper lobe pulmonary nodule.  Recommend attention
on follow-up.

3.   Multinodular goiter. Sub-centimeter thyroid nodule(s) noted,
too small to characterize, but most likely benign in the absence of
known clinical risk factors for thyroid carcinoma.

CT ABDOMEN AND PELVIS
FINDINGS: There is no focal hepatic lesion.  The gallbladder,
pancreas, spleen, adrenal glands, and kidneys are of normal.

Stomach and small bowel are normal.  There is a left upper abdomen
ostomy.  No evidence obstructive bowel.  The descending colon is
collapsed.  The rectum appears normal.

Abdominal aorta is normal.  No retroperitoneal periportal
lymphadenopathy.

No free fluid the pelvis.  Prostate gland bladder normal.  No
pelvic lymphadenopathy.
IMPRESSION: 1..  No evidence of colon cancer recurrence within the abdomen
pelvis.
2.  Post colon resection and diverting colostomy.

## 2016-11-06 ENCOUNTER — Other Ambulatory Visit: Payer: Self-pay | Admitting: Nurse Practitioner

## 2020-08-02 ENCOUNTER — Other Ambulatory Visit: Payer: Self-pay

## 2020-08-02 ENCOUNTER — Emergency Department (HOSPITAL_COMMUNITY): Payer: Medicare Other

## 2020-08-02 ENCOUNTER — Encounter (HOSPITAL_COMMUNITY): Payer: Self-pay

## 2020-08-02 ENCOUNTER — Inpatient Hospital Stay (HOSPITAL_COMMUNITY): Payer: Medicare Other

## 2020-08-02 ENCOUNTER — Inpatient Hospital Stay (HOSPITAL_COMMUNITY)
Admission: EM | Admit: 2020-08-02 | Discharge: 2020-08-04 | DRG: 375 | Disposition: A | Discharge status: Home / Self Care | Payer: Medicare Other | Source: Ambulatory Visit | Attending: Internal Medicine | Admitting: Internal Medicine

## 2020-08-02 DIAGNOSIS — F17210 Nicotine dependence, cigarettes, uncomplicated: Secondary | ICD-10-CM | POA: Diagnosis not present

## 2020-08-02 DIAGNOSIS — I10 Essential (primary) hypertension: Secondary | ICD-10-CM | POA: Diagnosis present

## 2020-08-02 DIAGNOSIS — K635 Polyp of colon: Secondary | ICD-10-CM | POA: Diagnosis not present

## 2020-08-02 DIAGNOSIS — Z9221 Personal history of antineoplastic chemotherapy: Secondary | ICD-10-CM

## 2020-08-02 DIAGNOSIS — K922 Gastrointestinal hemorrhage, unspecified: Secondary | ICD-10-CM | POA: Diagnosis present

## 2020-08-02 DIAGNOSIS — D509 Iron deficiency anemia, unspecified: Secondary | ICD-10-CM | POA: Diagnosis not present

## 2020-08-02 DIAGNOSIS — Z9109 Other allergy status, other than to drugs and biological substances: Secondary | ICD-10-CM

## 2020-08-02 DIAGNOSIS — Z85038 Personal history of other malignant neoplasm of large intestine: Secondary | ICD-10-CM

## 2020-08-02 DIAGNOSIS — Z9049 Acquired absence of other specified parts of digestive tract: Secondary | ICD-10-CM | POA: Diagnosis not present

## 2020-08-02 DIAGNOSIS — Z20822 Contact with and (suspected) exposure to covid-19: Secondary | ICD-10-CM | POA: Diagnosis not present

## 2020-08-02 DIAGNOSIS — K219 Gastro-esophageal reflux disease without esophagitis: Secondary | ICD-10-CM | POA: Diagnosis present

## 2020-08-02 DIAGNOSIS — D649 Anemia, unspecified: Secondary | ICD-10-CM | POA: Diagnosis present

## 2020-08-02 DIAGNOSIS — D62 Acute posthemorrhagic anemia: Secondary | ICD-10-CM | POA: Diagnosis present

## 2020-08-02 DIAGNOSIS — C180 Malignant neoplasm of cecum: Principal | ICD-10-CM | POA: Diagnosis present

## 2020-08-02 LAB — CBC WITH DIFFERENTIAL/PLATELET
Abs Immature Granulocytes: 0.02 10*3/uL (ref 0.00–0.07)
Basophils Absolute: 0 10*3/uL (ref 0.0–0.1)
Basophils Relative: 1 %
Eosinophils Absolute: 0.1 10*3/uL (ref 0.0–0.5)
Eosinophils Relative: 1 %
HCT: 15.8 % — ABNORMAL LOW (ref 39.0–52.0)
Hemoglobin: 3.8 g/dL — CL (ref 13.0–17.0)
Immature Granulocytes: 0 %
Lymphocytes Relative: 29 %
Lymphs Abs: 1.7 10*3/uL (ref 0.7–4.0)
MCH: 13.5 pg — ABNORMAL LOW (ref 26.0–34.0)
MCHC: 24.1 g/dL — ABNORMAL LOW (ref 30.0–36.0)
MCV: 56 fL — ABNORMAL LOW (ref 80.0–100.0)
Monocytes Absolute: 0.6 10*3/uL (ref 0.1–1.0)
Monocytes Relative: 10 %
Neutro Abs: 3.6 10*3/uL (ref 1.7–7.7)
Neutrophils Relative %: 59 %
Platelets: 626 10*3/uL — ABNORMAL HIGH (ref 150–400)
RBC: 2.82 MIL/uL — ABNORMAL LOW (ref 4.22–5.81)
RDW: 23.2 % — ABNORMAL HIGH (ref 11.5–15.5)
WBC: 6 10*3/uL (ref 4.0–10.5)
nRBC: 0 % (ref 0.0–0.2)

## 2020-08-02 LAB — COMPREHENSIVE METABOLIC PANEL
ALT: 9 U/L (ref 0–44)
AST: 12 U/L — ABNORMAL LOW (ref 15–41)
Albumin: 3.7 g/dL (ref 3.5–5.0)
Alkaline Phosphatase: 51 U/L (ref 38–126)
Anion gap: 12 (ref 5–15)
BUN: 12 mg/dL (ref 8–23)
CO2: 23 mmol/L (ref 22–32)
Calcium: 8.5 mg/dL — ABNORMAL LOW (ref 8.9–10.3)
Chloride: 105 mmol/L (ref 98–111)
Creatinine, Ser: 0.83 mg/dL (ref 0.61–1.24)
GFR calc Af Amer: >60 mL/min (ref >60)
GFR calc non Af Amer: >60 mL/min (ref >60)
Glucose, Bld: 86 mg/dL (ref 70–99)
Potassium: 3.7 mmol/L (ref 3.5–5.1)
Sodium: 140 mmol/L (ref 135–145)
Total Bilirubin: 0.6 mg/dL (ref 0.3–1.2)
Total Protein: 6.7 g/dL (ref 6.5–8.1)

## 2020-08-02 LAB — RESPIRATORY PANEL BY RT PCR (FLU A&B, COVID)
Influenza A by PCR: NEGATIVE
Influenza B by PCR: NEGATIVE
SARS Coronavirus 2 by RT PCR: NEGATIVE

## 2020-08-02 LAB — IRON AND TIBC
Iron: 35 ug/dL — ABNORMAL LOW (ref 45–182)
Saturation Ratios: 7 % — ABNORMAL LOW (ref 17.9–39.5)
TIBC: 505 ug/dL — ABNORMAL HIGH (ref 250–450)
UIBC: 470 ug/dL

## 2020-08-02 LAB — RETICULOCYTES
Immature Retic Fract: 37.3 % — ABNORMAL HIGH (ref 2.3–15.9)
RBC.: 2.8 MIL/uL — ABNORMAL LOW (ref 4.22–5.81)
Retic Count, Absolute: 74.2 10*3/uL (ref 19.0–186.0)
Retic Ct Pct: 2.7 % (ref 0.4–3.1)

## 2020-08-02 LAB — FERRITIN: Ferritin: 3 ng/mL — ABNORMAL LOW (ref 24–336)

## 2020-08-02 LAB — VITAMIN B12: Vitamin B-12: 230 pg/mL (ref 180–914)

## 2020-08-02 LAB — MRSA PCR SCREENING: MRSA by PCR: NEGATIVE

## 2020-08-02 LAB — ABO/RH: ABO/RH(D): O POS

## 2020-08-02 LAB — POC OCCULT BLOOD, ED: Fecal Occult Bld: POSITIVE — AB

## 2020-08-02 LAB — FOLATE: Folate: 19.9 ng/mL (ref >5.9)

## 2020-08-02 LAB — PREPARE RBC (CROSSMATCH)

## 2020-08-02 MED ORDER — CHLORHEXIDINE GLUCONATE CLOTH 2 % EX PADS
6.0000 | MEDICATED_PAD | Freq: Every day | CUTANEOUS | Status: DC
  Administered 2020-08-02: 6 via TOPICAL

## 2020-08-02 MED ORDER — SODIUM CHLORIDE (PF) 0.9 % IJ SOLN
INTRAMUSCULAR | Status: AC
  Filled 2020-08-02: qty 50

## 2020-08-02 MED ORDER — SODIUM CHLORIDE 0.9% IV SOLUTION: Freq: Once | INTRAVENOUS | Status: AC

## 2020-08-02 MED ORDER — ACETAMINOPHEN 650 MG RE SUPP: 650.0000 mg | Freq: Four times a day (QID) | RECTAL | Status: DC | PRN

## 2020-08-02 MED ORDER — ACETAMINOPHEN 325 MG PO TABS: 650.0000 mg | ORAL_TABLET | Freq: Four times a day (QID) | ORAL | Status: DC | PRN

## 2020-08-02 MED ORDER — ONDANSETRON HCL 4 MG/2ML IJ SOLN: 4.0000 mg | Freq: Four times a day (QID) | INTRAMUSCULAR | Status: DC | PRN

## 2020-08-02 MED ORDER — IOHEXOL 300 MG/ML  SOLN
100.0000 mL | Freq: Once | INTRAMUSCULAR | Status: AC | PRN
  Administered 2020-08-02: 100 mL via INTRAVENOUS

## 2020-08-02 MED ORDER — ORAL CARE MOUTH RINSE: 15.0000 mL | Freq: Two times a day (BID) | OROMUCOSAL | Status: DC

## 2020-08-02 MED ORDER — ONDANSETRON HCL 4 MG PO TABS: 4.0000 mg | ORAL_TABLET | Freq: Four times a day (QID) | ORAL | Status: DC | PRN

## 2020-08-02 MED ORDER — HYDRALAZINE HCL 20 MG/ML IJ SOLN: 10.0000 mg | INTRAMUSCULAR | Status: DC | PRN

## 2020-08-02 MED ORDER — LACTATED RINGERS IV SOLN: INTRAVENOUS | Status: DC

## 2020-08-02 MED ORDER — HYDRALAZINE HCL 10 MG PO TABS
10.0000 mg | ORAL_TABLET | Freq: Four times a day (QID) | ORAL | Status: DC | PRN
  Administered 2020-08-02: 10 mg via ORAL
  Filled 2020-08-02: qty 1

## 2020-08-02 NOTE — ED Notes (Addendum)
Unsuccessful second IV attempt. PA reports he will attempt [place] IV.

## 2020-08-02 NOTE — ED Provider Notes (Signed)
[facility] DEPT Provider Note   CSN: [id number] Arrival date & time: 08/02/20  1212     History Chief Complaint  Patient presents with  . Abnormal Lab    Aaron Roth is a 74 y.o. male.  HPI      Aaron Roth is a 74 y.o. male, with a history of distant history of colon cancer, GERD, presenting to the ED with generalized weakness for about the last 3 days.  Occasional shortness of breath. He states he was seen at an urgent care a couple days ago for this complaint.  They returned lab results today, told him his hemoglobin was 4.4, and that he needed to go to the emergency room. Denies anticoagulation. Denies fever/chills, chest pain, abdominal pain, dizziness, N/V/D, urinary symptoms, syncope, hematochezia/melena, or any other complaints.   Past Medical History:  Diagnosis Date  . Cancer ([place]) 07-01-12   colon-surgery and chemotherapy  . Colostomy care [facility]) 07-01-12   09-24-12 post colon resection  . Difficult intravenous access 07-01-12   Has port-a-cath at present, but is to be removed 07-14-12.  Marland Kitchen GERD (gastroesophageal reflux disease)     Patient Active Problem List   Diagnosis Date Noted  . Symptomatic anemia 08/02/2020  . 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 History of colon cancer, T3, N2 (4/15 nodes), colectomy 09/25/2011. 10/14/2011    Past Surgical History:  Procedure Laterality Date  . COLONOSCOPY  06/03/2012   Procedure: COLONOSCOPY;  Surgeon: [name], MD;  Location: [facility];  Service: General;  Laterality: N/A;  . COLOSTOMY  09/25/2011   Procedure: COLOSTOMY;  Surgeon: [name], MD;  Location: [facility];  Service: General;  Laterality: N/A;  . COLOSTOMY TAKEDOWN  07/14/2012   Procedure: LAPAROSCOPIC COLOSTOMY TAKEDOWN;  Surgeon: [name], MD;  Location: [facility];  Service: General;  Laterality: N/A;  laparoscopic coverted to open takedown of colostomy  .  GANGLION CYST EXCISION     wrist and foot  . PARTIAL COLECTOMY  09/25/2011   Procedure: PARTIAL COLECTOMY;  Surgeon: [name], MD;  Location: [facility];  Service: General;;  . PORT-A-CATH REMOVAL  07/14/2012   Procedure: REMOVAL PORT-A-CATH;  Surgeon: [name], MD;  Location: [facility];  Service: General;  Laterality: N/A;  . PORTACATH PLACEMENT  11/10/2011   Procedure: INSERTION PORT-A-CATH;  Surgeon: [name], MD;  Location: [facility];  Service: General;  Laterality: Left;  left subclavian  . PORTACATH PLACEMENT  11/10/2011       Family History  Problem Relation Age of Onset  . Bone cancer Brother   . ALS Sister   . Cancer Brother        bone, colon, testicular  . Cancer Father        prostate    Social History   Tobacco Use  . Smoking status: Current Every Day Smoker    Packs/day: 0.25    Years: 40.00    Pack years: 10.00    Types: Cigarettes  . Smokeless tobacco: Never Used  Vaping Use  . Vaping Use: Never used  Substance Use Topics  . Alcohol use: No  . Drug use: No    Home Medications Prior to Admission medications   Medication Sig Start Date End Date Taking? Authorizing Provider  geriatric multivitamins-minerals (ELDERTONIC/GEVRABON) ELIX Take 15 mLs by mouth daily.   Yes [provider]  Nutritional Supplements (IMMUNE ENHANCE) TABS  Take 1 tablet by mouth daily.   Yes [provider]    Allergies    Other  Review of Systems   Review of Systems  Constitutional: Negative for chills, diaphoresis and fever.  Respiratory: Positive for shortness of breath. Negative for cough.   Cardiovascular: Negative for chest pain.  Gastrointestinal: Negative for abdominal pain, blood in stool, diarrhea, nausea and vomiting.  Musculoskeletal: Negative for back pain.  Neurological: Positive for weakness. Negative for dizziness, syncope and light-headedness.  All other systems reviewed and are negative.   Physical Exam Updated  Vital Signs BP (!) 156/72   Pulse 83   Temp 97.9 F (36.6 C) (Oral)   Resp 18   Ht 5\' 9"  (1.753 m)   Wt 77.1 kg   SpO2 100%   BMI 25.10 kg/m   Physical Exam Vitals and nursing note reviewed.  Constitutional:      General: He is not in acute distress.    Appearance: He is well-developed. He is not diaphoretic.  HENT:     Head: Normocephalic and atraumatic.     Mouth/Throat:     Mouth: Mucous membranes are moist.     Pharynx: Oropharynx is clear.  Eyes:     Comments: Conjunctival pallor  Cardiovascular:     Rate and Rhythm: Normal rate and regular rhythm.     Pulses: Normal pulses.          Radial pulses are 2+ on the right side and 2+ on the left side.       Posterior tibial pulses are 2+ on the right side and 2+ on the left side.     Heart sounds: Normal heart sounds.     Comments: Tactile temperature in the extremities appropriate and equal bilaterally. Pulmonary:     Effort: Pulmonary effort is normal. No respiratory distress.     Breath sounds: Normal breath sounds.  Abdominal:     Palpations: Abdomen is soft.     Tenderness: There is no abdominal tenderness. There is no guarding.  Genitourinary:    Rectum: Guaiac result positive.     Comments: Rectal Exam:  No external hemorrhoids, fissures, or lesions noted.  No frank blood or melena. No stool burden.  No rectal tenderness. No foreign bodies noted.   Musculoskeletal:     Cervical back: Neck supple.     Right lower leg: No edema.     Left lower leg: No edema.  Lymphadenopathy:     Cervical: No cervical adenopathy.  Skin:    General: Skin is warm and dry.  Neurological:     Mental Status: He is alert.  Psychiatric:        Mood and Affect: Mood and affect normal.        Speech: Speech normal.        Behavior: Behavior normal.     ED Results / Procedures / Treatments   Labs (all labs ordered are listed, but only abnormal results are displayed) Labs Reviewed  COMPREHENSIVE METABOLIC PANEL - Abnormal;  Notable for the following components:      Result Value   Calcium 8.5 (*)    AST 12 (*)    All other components within normal limits  CBC WITH DIFFERENTIAL/PLATELET - Abnormal; Notable for the following components:   RBC 2.82 (*)    Hemoglobin 3.8 (*)    HCT 15.8 (*)    MCV 56.0 (*)    MCH 13.5 (*)    MCHC 24.1 (*)  RDW 23.2 (*)    Platelets 626 (*)    All other components within normal limits  IRON AND TIBC - Abnormal; Notable for the following components:   Iron 35 (*)    TIBC 505 (*)    Saturation Ratios 7 (*)    All other components within normal limits  FERRITIN - Abnormal; Notable for the following components:   Ferritin 3 (*)    All other components within normal limits  RETICULOCYTES - Abnormal; Notable for the following components:   RBC. 2.80 (*)    Immature Retic Fract 37.3 (*)    All other components within normal limits  POC OCCULT BLOOD, ED - Abnormal; Notable for the following components:   Fecal Occult Bld POSITIVE (*)    All other components within normal limits  RESPIRATORY PANEL BY RT PCR (FLU A&B, COVID)  MRSA PCR SCREENING  VITAMIN B12  FOLATE  CBC  COMPREHENSIVE METABOLIC PANEL  TYPE AND SCREEN  ABO/RH  PREPARE RBC (CROSSMATCH)    EKG None  Radiology CT ABDOMEN PELVIS W CONTRAST  Result Date: 08/02/2020 CLINICAL DATA:  Anemia. Hemoccult positive. History of colon cancer 2012. EXAM: CT ABDOMEN AND PELVIS WITH CONTRAST TECHNIQUE: Multidetector CT imaging of the abdomen and pelvis was performed using the standard protocol following bolus administration of intravenous contrast. CONTRAST:  156mL OMNIPAQUE IOHEXOL 300 MG/ML  SOLN COMPARISON:  Most recent available CT 06/06/2012 FINDINGS: Lower chest: Trace right pleural fluid. Mild subpleural opacities in the dependent lower lobes right greater than. Upper normal heart size. Hepatobiliary: No focal hepatic abnormality. Unremarkable gallbladder without calcified gallstone or pericholecystic  inflammation. Pancreas: No ductal dilatation or inflammation. Spleen: Normal in size without focal abnormality. Adrenals/Urinary Tract: Normal adrenal glands. No hydronephrosis or perinephric edema. Homogeneous renal enhancement with symmetric excretion on delayed phase imaging. Minimally distended urinary bladder without evident bladder wall thickening. Stomach/Bowel: Bowel evaluation is limited in the absence of enteric contrast. Stomach is nondistended. Normal positioning of the duodenum and ligament of Treitz. No obvious gastric or duodenal inflammation. There is no small bowel obstruction or inflammatory change. Appendix visualized and normal. Small volume of stool throughout the colon. Probable postsurgical change involving the left colon. There is no colonic wall thickening or evidence of focal colonic mass. Vascular/Lymphatic: Mild aortic atherosclerosis. No aneurysm. No bulky abdominopelvic adenopathy. Reproductive: Prominent prostate gland spanning 5.1 cm transverse. Other: Small amount of free fluid in the pelvis is nonspecific. No free air or focal fluid collection. Postsurgical changes the anterior abdominal wall. Musculoskeletal: Degenerative change throughout the spine and both hips. There are no acute or suspicious osseous abnormalities. IMPRESSION: 1. Small amount of free fluid in the pelvis is nonspecific. 2. Probable postsurgical change involving the left colon. No bowel inflammation or explanation for GI bleed. 3. Trace right pleural fluid. Mild subpleural opacities in the dependent lower lobes right greater than left, may represent atelectasis or potentially interstitial lung disease. 4. Prominent prostate gland. Aortic Atherosclerosis (ICD10-I70.0). Electronically Signed   By: Keith Rake M.D.   On: 08/02/2020 17:15   DG Chest Portable 1 View  Result Date: 08/02/2020 CLINICAL DATA:  Shortness of breath and weakness. EXAM: PORTABLE CHEST 1 VIEW COMPARISON:  November 10, 2011 FINDINGS: The  heart size and mediastinal contours are within normal limits. Both lungs are clear. There is tortuosity of the descending thoracic aorta. The visualized skeletal structures are unremarkable. IMPRESSION: No active disease. Electronically Signed   By: Virgina Norfolk M.D.   On: 08/02/2020 16:22  Procedures Procedures (including critical care time)  Medications Ordered in ED Medications  lactated ringers infusion (has no administration in time range)  acetaminophen (TYLENOL) tablet 650 mg (has no administration in time range)    Or  acetaminophen (TYLENOL) suppository 650 mg (has no administration in time range)  ondansetron (ZOFRAN) tablet 4 mg (has no administration in time range)    Or  ondansetron (ZOFRAN) injection 4 mg (has no administration in time range)  sodium chloride (PF) 0.9 % injection (has no administration in time range)  Chlorhexidine Gluconate Cloth 2 % PADS 6 each (has no administration in time range)  MEDLINE mouth rinse (has no administration in time range)  hydrALAZINE (APRESOLINE) tablet 10 mg (has no administration in time range)  0.9 %  sodium chloride infusion (Manually program via Guardrails IV Fluids) ( Intravenous New Bag/Given 08/02/20 1715)  iohexol (OMNIPAQUE) 300 MG/ML solution 100 mL (100 mLs Intravenous Contrast Given 08/02/20 1642)    ED Course  I have reviewed the triage vital signs and the nursing notes.  Pertinent labs & imaging results that were available during my care of the patient were reviewed by me and considered in my medical decision making (see chart for details).  Clinical Course as of Aug 02 1837  Fri Aug 02, 2020  1528 Spoke with Dr. Benny Lennert, hospitalist.  Agrees to admit the patient.   [SJ]  [place] with Dr. Therisa Doyne, [facility]. States she will come see the patient. Requests CT abd/pelvis.   [SJ]    Clinical Course User Index [SJ] Hrithik Boschee, Helane Gunther, PA-C   MDM Rules/Calculators/A&P                          Patient presents with  generalized weakness over the last several days. Afebrile, not hypotensive, not tachycardic, nontoxic, no apparent distress, SPO2 excellent and stable on room air.  I personally reviewed and interpreted the patient's labs and imaging studies. Hemoglobin 4.4 two days ago, now 3.8.  Positive Hemoccult. Blood transfusions ordered.  Findings and plan of care discussed with Quintella Reichert, MD. Dr. Ralene Bathe personally evaluated and examined this patient.   Vitals:   08/02/20 1706 08/02/20 1726 08/02/20 1803 08/02/20 1823  BP: (!) 174/73 (!) 167/84  (!) 181/80  Pulse: 89 94 92 87  Resp: 14 18 18 19   Temp: 98.2 F (36.8 C) 98.4 F (36.9 C)    TempSrc: Oral Oral    SpO2: 100% 100% 97% 100%  Weight:      Height:         Final Clinical Impression(s) / ED Diagnoses Final diagnoses:  Symptomatic anemia    Rx / DC Orders ED Discharge Orders    None       Layla Maw 08/02/20 1841    Quintella Reichert, MD 08/03/20 [phone]

## 2020-08-02 NOTE — H&P (Signed)
Aaron Roth is an 74 y.o. male.   Chief Complaint: Weakness, occasional shortness of breath x 3 days. HPI: The patient is a 74 yr old man who went to urgent care on Wednesday with the above chief complaints. Labs were drawn on that visit. The patient was called and told to come to the ED because hemoglobin was 4.4. In the ED this was tested again and his hemoglobin was 3.8.   This patient has a past medical history significant for colon cancer and GERD.  The patient denied dizziness, chest pain, hematochezia or hematemesis. He has had coffee ground emesis and melena. No fevers, chills, cough, sputum production. No neurological changes, rashes or sores.  In the ED the patient was found to be hypertensive, HR, RR, and O2 saturation were within normal range.  Lab demonstrated Fe was low at 35, Fe saturation was 7. Ferritin is 3. WBC is 6.0, Hgb is 3.8.   The patient was transfused with 3 units PRBC's. GI was consulted.   [facility] were consulted to admit the patient for further evaluation and treatment.  Past Medical History:  Diagnosis Date  . Cancer ([place]) 07-01-12   colon-surgery and chemotherapy  . Colostomy care [facility]) 07-01-12   09-24-12 post colon resection  . Difficult intravenous access 07-01-12   Has port-a-cath at present, but is to be removed 07-14-12.  Marland Kitchen GERD (gastroesophageal reflux disease)     Past Surgical History:  Procedure Laterality Date  . COLONOSCOPY  06/03/2012   Procedure: COLONOSCOPY;  Surgeon: [name], MD;  Location[facility];  Service: General;  Laterality: N/A;  . COLOSTOMY  09/25/2011   Procedure: COLOSTOMY;  Surgeon: [name], MD;  Location: [facility];  Service: General;  Laterality: N/A;  . COLOSTOMY TAKEDOWN  07/14/2012   Procedure: LAPAROSCOPIC COLOSTOMY TAKEDOWN;  Surgeon: [name], MD;  Location: [facility];  Service: General;  Laterality: N/A;  laparoscopic coverted to open takedown of colostomy  . GANGLION CYST EXCISION      wrist and foot  . PARTIAL COLECTOMY  09/25/2011   Procedure: PARTIAL COLECTOMY;  Surgeon: [name], MD;  Location: [facility];  Service: General;;  . PORT-A-CATH REMOVAL  07/14/2012   Procedure: REMOVAL PORT-A-CATH;  Surgeon: [name], MD;  Location: [facility];  Service: General;  Laterality: N/A;  . PORTACATH PLACEMENT  11/10/2011   Procedure: INSERTION PORT-A-CATH;  Surgeon: [name], MD;  Location: [facility];  Service: General;  Laterality: Left;  left subclavian  . PORTACATH PLACEMENT  11/10/2011    Family History  Problem Relation Age of Onset  . Bone cancer Brother   . ALS Sister   . Cancer Brother        bone, colon, testicular  . Cancer Father        prostate   Social History:  reports that he has been smoking cigarettes. He has a 10.00 pack-year smoking history. He has never used smokeless tobacco. He reports that he does not drink alcohol and does not use drugs. Medications Prior to Admission  Medication Sig Dispense Refill  . geriatric multivitamins-minerals (ELDERTONIC/GEVRABON) ELIX Take 15 mLs by mouth daily.    . Nutritional Supplements (IMMUNE ENHANCE) TABS Take 1 tablet by mouth daily.      Allergies:  Allergies  Allergen Reactions  . Other     Pre-breaded shrimp    Pertinent items noted in HPI and remainder of comprehensive ROS otherwise negative.   General  appearance: alert, cooperative and no distress Head: Normocephalic, without obvious abnormality, atraumatic Eyes: conjunctivae/corneas clear. PERRL, EOM's intact. Fundi benign. Throat: lips, mucosa, and tongue normal; teeth and gums normal Neck: no adenopathy, no carotid bruit, no JVD, supple, symmetrical, trachea midline and thyroid not enlarged, symmetric, no tenderness/mass/nodules Resp: No increased work of breathing. No wheezes, rales, or rhonchi. No tactile fremitus. Chest wall: no tenderness Cardio: regular rate and rhythm, S1, S2 normal, no murmur, click, rub or gallop GI:  soft, non-tender; bowel sounds normal; no masses,  no organomegaly Extremities: extremities normal, atraumatic, no cyanosis or edema Pulses: 2+ and symmetric Skin: Skin color, texture, turgor normal. No rashes or lesions Lymph nodes: Cervical, supraclavicular, and axillary nodes normal. Neurologic: Alert and oriented X 3, normal strength and tone. Normal symmetric reflexes. Normal coordination and gait  Results for orders placed or performed during the hospital encounter of 08/02/20 (from the past 48 hour(s))  POC occult blood, ED     Status: Abnormal   Collection Time: 08/02/20  1:32 PM  Result Value Ref Range   Fecal Occult Bld POSITIVE (A) NEGATIVE  Comprehensive metabolic panel     Status: Abnormal   Collection Time: 08/02/20  1:54 PM  Result Value Ref Range   Sodium 140 135 - 145 mmol/L   Potassium 3.7 3.5 - 5.1 mmol/L   Chloride 105 98 - 111 mmol/L   CO2 23 22 - 32 mmol/L   Glucose, Bld 86 70 - 99 mg/dL    Comment: Glucose reference range applies only to samples taken after fasting for at least 8 hours.   BUN 12 8 - 23 mg/dL   Creatinine, Ser 0.83 0.61 - 1.24 mg/dL   Calcium 8.5 (L) 8.9 - 10.3 mg/dL   Total Protein 6.7 6.5 - 8.1 g/dL   Albumin 3.7 3.5 - 5.0 g/dL   AST 12 (L) 15 - 41 U/L   ALT 9 0 - 44 U/L   Alkaline Phosphatase 51 38 - 126 U/L   Total Bilirubin 0.6 0.3 - 1.2 mg/dL   GFR calc non Af Amer >60 >60 mL/min   GFR calc Af Amer >60 >60 mL/min   Anion gap 12 5 - 15    Comment: Performed at [facility], [place] [address] [place], [place] [address]  Type and screen [place]     Status: None (Preliminary result)   Collection Time: 08/02/20  1:54 PM  Result Value Ref Range   ABO/RH(D) O POS    Antibody Screen NEG    Sample Expiration 08/05/2020,2359    Unit Number [id number]    Blood Component Type RED CELLS,LR    Unit division 00    Status of Unit ISSUED    Transfusion Status OK TO TRANSFUSE    Crossmatch Result       Compatible Performed at [facility], [place] [address] [place], [place] [address]    Unit Number [id number]    Blood Component Type RED CELLS,LR    Unit division 00    Status of Unit ALLOCATED    Transfusion Status OK TO TRANSFUSE    Crossmatch Result Compatible    Unit Number [id number]    Blood Component Type RED CELLS,LR    Unit division 00    Status of Unit ISSUED    Transfusion Status OK TO TRANSFUSE    Crossmatch Result Compatible   CBC with Differential     Status: Abnormal   Collection Time: 08/02/20  1:54 PM  Result Value Ref Range   WBC 6.0 4.0 - 10.5 K/uL   RBC 2.82 (L) 4.22 - 5.81 MIL/uL   Hemoglobin 3.8 (LL) 13.0 - 17.0 g/dL    Comment: REPEATED TO VERIFY Reticulocyte Hemoglobin testing may be clinically indicated, consider ordering this additional test [id number] THIS CRITICAL RESULT HAS VERIFIED AND BEEN CALLED TO S.[facility]ST BY NATHAN THOMPSON ON 10 01 2021 AT 1425, AND HAS BEEN READ BACK. CRITICAL RESULT VERIFIED    HCT 15.8 (L) 39 - 52 %   MCV 56.0 (L) 80.0 - 100.0 fL   MCH 13.5 (L) 26.0 - 34.0 pg   MCHC 24.1 (L) 30.0 - 36.0 g/dL   RDW 23.2 (H) 11.5 - 15.5 %   Platelets 626 (H) 150 - 400 K/uL   nRBC 0.0 0.0 - 0.2 %   Neutrophils Relative % 59 %   Neutro Abs 3.6 1.7 - 7.7 K/uL   Lymphocytes Relative 29 %   Lymphs Abs 1.7 0.7 - 4.0 K/uL   Monocytes Relative 10 %   Monocytes Absolute 0.6 0 - 1 K/uL   Eosinophils Relative 1 %   Eosinophils Absolute 0.1 0 - 0 K/uL   Basophils Relative 1 %   Basophils Absolute 0.0 0 - 0 K/uL   Immature Granulocytes 0 %   Abs Immature Granulocytes 0.02 0.00 - 0.07 K/uL   Tear Drop Cells PRESENT    Polychromasia PRESENT    Target Cells PRESENT    Ovalocytes PRESENT     Comment: Performed at [facility], [place] [address], [place], [place] [address]  Vitamin B12     Status: None   Collection Time: 08/02/20  1:54 PM  Result Value Ref Range   Vitamin B-12 230 180 - 914 pg/mL    Comment:  (NOTE) This assay is not validated for testing neonatal or myeloproliferative syndrome specimens for Vitamin B12 levels. Performed at [facility], [place] [address], [place], [place] [address]   Folate     Status: None   Collection Time: 08/02/20  1:54 PM  Result Value Ref Range   Folate 19.9 >5.9 ng/mL    Comment: Performed at [facility], [place] [address], [place], [place] [address]  Iron and TIBC     Status: Abnormal   Collection Time: 08/02/20  1:54 PM  Result Value Ref Range   Iron 35 (L) 45 - 182 ug/dL   TIBC 505 (H) 250 - 450 ug/dL   Saturation Ratios 7 (L) 17.9 - 39.5 %   UIBC 470 ug/dL    Comment: Performed at [facility], [place] [address], [place], [place] [address]  Ferritin     Status: Abnormal   Collection Time: 08/02/20  1:54 PM  Result Value Ref Range   Ferritin 3 (L) 24 - 336 ng/mL    Comment: Performed at [facility], [place] [address], [place], [place] [address]  Reticulocytes     Status: Abnormal   Collection Time: 08/02/20  1:54 PM  Result Value Ref Range   Retic Ct Pct 2.7 0.4 - 3.1 %   RBC. 2.80 (L) 4.22 - 5.81 MIL/uL   Retic Count, Absolute 74.2 19.0 - 186.0 K/uL   Immature Retic Fract 37.3 (H) 2.3 - 15.9 %    Comment: Performed at [facility], [place] [address] [place], [place] [address]  Prepare RBC (crossmatch)     Status: None   Collection Time: 08/02/20  2:29 PM  Result Value Ref Range   Order Confirmation      ORDER PROCESSED BY BLOOD BANK Performed at [facility], [place] [address] [place], [place] [address]   Respiratory Panel by RT PCR (Flu A&B, Covid) - Nasopharyngeal Swab     Status: None   Collection Time: 08/02/20  2:52 PM   Specimen: Nasopharyngeal Swab  Result Value Ref Range   SARS Coronavirus 2 by RT PCR NEGATIVE NEGATIVE    Comment: (NOTE) SARS-CoV-2 target nucleic acids are NOT DETECTED.  The SARS-CoV-2 RNA is generally  detectable in upper respiratoy specimens during the acute phase of infection. The lowest concentration of SARS-CoV-2 viral copies this assay can detect is 131 copies/mL. A negative result does not preclude SARS-Cov-2 infection and should not be used as the sole basis for treatment or other patient management decisions. A negative result may occur with  improper specimen collection/handling, submission of specimen other than nasopharyngeal swab, presence of viral mutation(s) within the areas targeted by this assay, and inadequate number of viral copies (<131 copies/mL). A negative result must be combined with clinical observations, patient history, and epidemiological information. The expected result is Negative.  Fact Sheet for Patients:  PinkCheek.be  Fact Sheet for Healthcare Providers:  GravelBags.it  This test is no t yet approved or cleared by the [place] FDA and  has been authorized for detection and/or diagnosis of SARS-CoV-2 by FDA under an Emergency Use Authorization (EUA). This EUA will remain  in effect (meaning this test can be used) for the duration of the COVID-19 declaration under Section 564(b)(1) of the Act, 21 U.S.C. section 360bbb-3(b)(1), unless the authorization is terminated or revoked sooner.     Influenza A by PCR NEGATIVE NEGATIVE   Influenza B by PCR NEGATIVE NEGATIVE    Comment: (NOTE) The Xpert Xpress SARS-CoV-2/FLU/RSV assay is intended as an aid in  the diagnosis of influenza from Nasopharyngeal swab specimens and  should not be used as a sole basis for treatment. Nasal washings and  aspirates are unacceptable for Xpert Xpress SARS-CoV-2/FLU/RSV  testing.  Fact Sheet for Patients: PinkCheek.be  Fact Sheet for Healthcare Providers: GravelBags.it  This test is not yet approved or cleared by the [place] FDA and  has been  authorized for detection and/or diagnosis of SARS-CoV-2 by  FDA under an Emergency Use Authorization (EUA). This EUA will remain  in effect (meaning this test can be used) for the duration of the  Covid-19 declaration under Section 564(b)(1) of the Act, 21  U.S.C. section 360bbb-3(b)(1), unless the authorization is  terminated or revoked. Performed at [facility], [place] [address] [place], [place] [address]   ABO/Rh     Status: None   Collection Time: 08/02/20  2:53 PM  Result Value Ref Range   ABO/RH(D)      O POS Performed at [facility], [place] [address] [place], [place] [address]   MRSA PCR Screening     Status: None   Collection Time: 08/02/20  6:08 PM   Specimen: Nasal Mucosa; Nasopharyngeal  Result Value Ref Range   MRSA by PCR NEGATIVE NEGATIVE    Comment:        The GeneXpert MRSA Assay (FDA approved for NASAL specimens only), is one component of a comprehensive MRSA colonization surveillance program. It is not intended to diagnose MRSA infection nor to guide or monitor treatment for MRSA infections. Performed at [facility], [place] [address]  [place], [place] [address]    @[email or web address] @  Blood pressure (!) 191/73, pulse 88, temperature 99 F (37.2 C), temperature source Oral, resp. rate 17, height 5\' 9"  (1.753 m), weight 77.1 kg, SpO2 99 %.    Assessment/Plan Symptomatic anemia: Patient is weak and short of breath. Pt called to present to ED due to Hgb of 4.4 from urgent care. Hgb 3.8 in ED. Orders placed to transfuse 3 units PRBC's. SCD's for DVT Prophylaxis. GI Consulted. Dr. Therisa Doyne has ordered CT of the abdomen and pelvis. Possible colonoscopy and EGD if Hgb is stable for procedure tomorrow. Clear liquid diet and IV protonix. Monitor hemoglobin and transfuse as necessary.  Iron deficiency anemia: Will order feraheme. Monitor History of adenocarcinoma of colon: Noted.   GERD: Pt is receiving IV  protonix.  History of colon cancer: Noted.   I have seen and examined this patientmyself. I have spent 70 minutes in his evaluation and admission.  DVT Prophylaxis: SCD's CODE STATUS: Full code Family Communication: None available Disposition: From home. Admitted as inpatient to step down bed.  Status is: Inpatient  Remains inpatient appropriate because:Inpatient level of care appropriate due to severity of illness   Dispo: The patient is from: Home              Anticipated d/c is to: Home              Anticipated d/c date is: 3 days              Patient currently is not medically stable to d/c.  Severity of Illness: The appropriate patient status for this patient is INPATIENT. Inpatient status is judged to be reasonable and necessary in order to provide the required intensity of service to ensure the patient's safety. The patient's presenting symptoms, physical exam findings, and initial radiographic and laboratory data in the context of their chronic comorbidities is felt to place them at high risk for further clinical deterioration. Furthermore, it is not anticipated that the patient will be medically stable for discharge from the hospital within 2 midnights of admission. The following factors support the patient status of inpatient.   " The patient's presenting symptoms include shortness of breath, weakness. " The worrisome physical exam findings include weakness, pale. " The initial radiographic and laboratory data are worrisome because of Hemoglobin is 3.8.. " The chronic co-morbidities include GERD, Iron deficiency anemia.   * I certify that at the point of admission it is my clinical judgment that the patient will require inpatient hospital care spanning beyond 2 midnights from the point of admission due to high intensity of service, high risk for further deterioration and high frequency of surveillance required.*  Rasheena Talmadge 08/02/2020, 11:11 PM

## 2020-08-02 NOTE — ED Notes (Signed)
XR at bedside

## 2020-08-02 NOTE — ED Notes (Signed)
ED Provider at bedside. 

## 2020-08-02 NOTE — Consult Note (Signed)
[facility] Gastroenterology Consult  Referring Provider: ER Primary Care Physician:  Patient, No Pcp Per Primary Gastroenterologist: Althia Forts  Reason for Consultation: Symptomatic severe anemia  HPI: Aaron Roth is a 74 y.o. male with history of invasive adenocarcinoma of the colon in 2012, status post resection with lymph node invasion, colostomy placement, colostomy removal in 07/2012, chemotherapy for 6 months at that point, and no follow-up thereafter presents with severe anemia.  Patient has been getting progressively fatigued and went to an urgent center where blood work showed hemoglobin of 4.4 and was advised to go to the hospital for admission. On presentation to the hospital he was found to have a hemoglobin of 3.8, severe microcytosis, MCV 56, thrombocytosis, platelet 626. Patient denies noticing blood in stool or black stools. He has had about 8 pounds of intentional weight loss over the last 6 to 8 months. He denies nausea, vomiting, early satiety, bloating, acid reflux, heartburn, difficulty swallowing or pain on swallowing.  Surprisingly patient does not recall having a colonoscopy before colonic resection or as a follow-up thereafter.  Patient denies use of NSAIDs, aspirin or any blood thinners.   Past Medical History:  Diagnosis Date  . Cancer ([place]) 07-01-12   colon-surgery and chemotherapy  . Colostomy care [facility]) 07-01-12   09-24-12 post colon resection  . Difficult intravenous access 07-01-12   Has port-a-cath at present, but is to be removed 07-14-12.  Marland Kitchen GERD (gastroesophageal reflux disease)     Past Surgical History:  Procedure Laterality Date  . COLONOSCOPY  06/03/2012   Procedure: COLONOSCOPY;  Surgeon: [name], MD;  Location: [facility];  Service: General;  Laterality: N/A;  . COLOSTOMY  09/25/2011   Procedure: COLOSTOMY;  Surgeon: [name], MD;  Location: [facility];  Service: General;  Laterality: N/A;  . COLOSTOMY TAKEDOWN  07/14/2012    Procedure: LAPAROSCOPIC COLOSTOMY TAKEDOWN;  Surgeon: [name], MD;  Location: [facility];  Service: General;  Laterality: N/A;  laparoscopic coverted to open takedown of colostomy  . GANGLION CYST EXCISION     wrist and foot  . PARTIAL COLECTOMY  09/25/2011   Procedure: PARTIAL COLECTOMY;  Surgeon: [name], MD;  Location: [facility];  Service: General;;  . PORT-A-CATH REMOVAL  07/14/2012   Procedure: REMOVAL PORT-A-CATH;  Surgeon: [name], MD;  Location: [facility];  Service: General;  Laterality: N/A;  . PORTACATH PLACEMENT  11/10/2011   Procedure: INSERTION PORT-A-CATH;  Surgeon: [name], MD;  Location: [facility];  Service: General;  Laterality: Left;  left subclavian  . PORTACATH PLACEMENT  11/10/2011    Prior to Admission medications   Medication Sig Start Date End Date Taking? Authorizing Provider  geriatric multivitamins-minerals (ELDERTONIC/GEVRABON) ELIX Take 15 mLs by mouth daily.   Yes [provider]  Nutritional Supplements (IMMUNE ENHANCE) TABS Take 1 tablet by mouth daily.   Yes [provider]    Current Facility-Administered Medications  Medication Dose Route Frequency Provider Last Rate Last Admin  . 0.9 %  sodium chloride infusion (Manually program via Guardrails IV Fluids)   Intravenous Once Joy, Shawn C, PA-C      . acetaminophen (TYLENOL) tablet 650 mg  650 mg Oral Q6H PRN Swayze, Ava, DO       Or  . acetaminophen (TYLENOL) suppository 650 mg  650 mg Rectal Q6H PRN Swayze, Ava, DO      . lactated ringers infusion   Intravenous Continuous Swayze, Ava, DO      .  ondansetron (ZOFRAN) tablet 4 mg  4 mg Oral Q6H PRN Swayze, Ava, DO       Or  . ondansetron (ZOFRAN) injection 4 mg  4 mg Intravenous Q6H PRN Swayze, Ava, DO      . sodium chloride (PF) 0.9 % injection            Current Outpatient Medications  Medication Sig Dispense Refill  . geriatric multivitamins-minerals (ELDERTONIC/GEVRABON) ELIX Take 15 mLs by mouth  daily.    . Nutritional Supplements (IMMUNE ENHANCE) TABS Take 1 tablet by mouth daily.      Allergies as of 08/02/2020 - Review Complete 08/02/2020  Allergen Reaction Noted  . Other  07/29/2012    Family History  Problem Relation Age of Onset  . Bone cancer Brother   . ALS Sister   . Cancer Brother        bone, colon, testicular  . Cancer Father        prostate    Social History   Socioeconomic History  . Marital status: Married    Spouse name: Not on file  . Number of children: Not on file  . Years of education: Not on file  . Highest education level: Not on file  Occupational History  . Not on file  Tobacco Use  . Smoking status: Current Every Day Smoker    Packs/day: 0.25    Years: 40.00    Pack years: 10.00    Types: Cigarettes  . Smokeless tobacco: Never Used  Vaping Use  . Vaping Use: Never used  Substance and Sexual Activity  . Alcohol use: No  . Drug use: No  . Sexual activity: Yes  Other Topics Concern  . Not on file  Social History Narrative  . Not on file   Social Determinants of Health   Financial Resource Strain:   . Difficulty of Paying Living Expenses: Not on file  Food Insecurity:   . Worried About Charity fundraiser in the Last Year: Not on file  . Ran Out of Food in the Last Year: Not on file  Transportation Needs:   . Lack of Transportation (Medical): Not on file  . Lack of Transportation (Non-Medical): Not on file  Physical Activity:   . Days of Exercise per Week: Not on file  . Minutes of Exercise per Session: Not on file  Stress:   . Feeling of Stress : Not on file  Social Connections:   . Frequency of Communication with Friends and Family: Not on file  . Frequency of Social Gatherings with Friends and Family: Not on file  . Attends Religious Services: Not on file  . Active Member of Clubs or Organizations: Not on file  . Attends Archivist Meetings: Not on file  . Marital Status: Not on file  Intimate Partner  Violence:   . Fear of Current or Ex-Partner: Not on file  . Emotionally Abused: Not on file  . Physically Abused: Not on file  . Sexually Abused: Not on file    Review of Systems: Positive for: GI: Described in detail in HPI.    Gen: fatigue, weakness, malaise, involuntary weight loss, denies any fever, chills, rigors, night sweats, anorexia, and sleep disorder CV: Denies chest pain, angina, palpitations, syncope, orthopnea, PND, peripheral edema, and claudication. Resp: Denies dyspnea, cough, sputum, wheezing, coughing up blood. GU : Denies urinary burning, blood in urine, urinary frequency, urinary hesitancy, nocturnal urination, and urinary incontinence. MS: Denies joint pain or swelling.  Denies muscle weakness, cramps, atrophy.  Derm: Denies rash, itching, oral ulcerations, hives, unhealing ulcers.  Psych: Denies depression, anxiety, memory loss, suicidal ideation, hallucinations,  and confusion. Heme: Denies bruising, bleeding, and enlarged lymph nodes. Neuro:  Denies any headaches, dizziness, paresthesias. Endo:  Denies any problems with DM, thyroid, adrenal function.  Physical Exam: Vital signs in last 24 hours: Temp:  [97.9 F (36.6 C)-98.7 F (37.1 C)] 97.9 F (36.6 C) (10/01 1637) Pulse Rate:  [83-116] 116 (10/01 1637) Resp:  [12-20] 18 (10/01 1637) BP: (145-188)/(72-110) 175/77 (10/01 1637) SpO2:  [99 %-100 %] 99 % (10/01 1637) Weight:  [77.1 kg] 77.1 kg (10/01 1230)    General:   Alert,  Well-developed, well-nourished, pleasant and cooperative in NAD Head:  Normocephalic and atraumatic. Eyes: Prominent pallor Ears:  Normal auditory acuity. Nose:  No deformity, discharge,  or lesions. Mouth:  No deformity or lesions.  Oropharynx pink & moist. Neck:  Supple; no masses or thyromegaly. Lungs:  Clear throughout to auscultation.   No wheezes, crackles, or rhonchi. No acute distress. Heart:  Regular rate and rhythm; no murmurs, clicks, rubs,  or gallops. Extremities:   Without clubbing or edema. Neurologic:  Alert and  oriented x4;  grossly normal neurologically. Skin:  Intact without significant lesions or rashes. Psych:  Alert and cooperative. Normal mood and affect. Abdomen:  Soft, nontender and nondistended. No masses, hepatosplenomegaly or hernias noted. Normal bowel sounds, without guarding, and without rebound.         Lab Results: Recent Labs    08/02/20 1354  WBC 6.0  HGB 3.8*  HCT 15.8*  PLT 626*   BMET Recent Labs    08/02/20 1354  NA 140  K 3.7  CL 105  CO2 23  GLUCOSE 86  BUN 12  CREATININE 0.83  CALCIUM 8.5*   LFT Recent Labs    08/02/20 1354  PROT 6.7  ALBUMIN 3.7  AST 12*  ALT 9  ALKPHOS 51  BILITOT 0.6   PT/INR No results for input(s): LABPROT, INR in the last 72 hours.  Studies/Results: DG Chest Portable 1 View  Result Date: 08/02/2020 CLINICAL DATA:  Shortness of breath and weakness. EXAM: PORTABLE CHEST 1 VIEW COMPARISON:  November 10, 2011 FINDINGS: The heart size and mediastinal contours are within normal limits. Both lungs are clear. There is tortuosity of the descending thoracic aorta. The visualized skeletal structures are unremarkable. IMPRESSION: No active disease. Electronically Signed   By: Virgina Norfolk M.D.   On: 08/02/2020 16:22    Impression: Severe symptomatic iron deficiency anemia, hemoglobin 3.8, MCV 56, platelet 626, iron saturation 7%, ferritin 3, elevated TIBC History of colon cancer, status post resection in 2012, lymph node invasion noted at that point with 6 months chemotherapy, no follow-up since.  Plan: 3 units PRBC transfusion CAT scan of the abdomen and pelvis today Will reevaluate with blood work tomorrow and if hemoglobin is at an acceptable range, plan to perform  Colonoscopy+/-EGD on Sunday. We will start patient on clear liquid diet meanwhile, likely will start colonic prep tomorrow in a.m.Marland Kitchen Discussed the same with the patient and his wife at bedside. They understand  and verbalized consent.   LOS: 0 days   Ronnette Juniper, MD  08/02/2020, 5:05 PM

## 2020-08-02 NOTE — ED Triage Notes (Signed)
Patient states he had lab work completed 2 days ago. Patient was notified today that his Hgb was 4.4 2 days ago.

## 2020-08-02 NOTE — ED Notes (Signed)
Patient transported to CT 

## 2020-08-03 ENCOUNTER — Encounter (HOSPITAL_COMMUNITY): Payer: Self-pay | Admitting: Internal Medicine

## 2020-08-03 LAB — COMPREHENSIVE METABOLIC PANEL
ALT: 8 U/L (ref 0–44)
AST: 13 U/L — ABNORMAL LOW (ref 15–41)
Albumin: 3.4 g/dL — ABNORMAL LOW (ref 3.5–5.0)
Alkaline Phosphatase: 50 U/L (ref 38–126)
Anion gap: 8 (ref 5–15)
BUN: 7 mg/dL — ABNORMAL LOW (ref 8–23)
CO2: 23 mmol/L (ref 22–32)
Calcium: 8.3 mg/dL — ABNORMAL LOW (ref 8.9–10.3)
Chloride: 106 mmol/L (ref 98–111)
Creatinine, Ser: 0.82 mg/dL (ref 0.61–1.24)
GFR calc Af Amer: >60 mL/min (ref >60)
GFR calc non Af Amer: >60 mL/min (ref >60)
Glucose, Bld: 80 mg/dL (ref 70–99)
Potassium: 3.6 mmol/L (ref 3.5–5.1)
Sodium: 137 mmol/L (ref 135–145)
Total Bilirubin: 0.9 mg/dL (ref 0.3–1.2)
Total Protein: 6.3 g/dL — ABNORMAL LOW (ref 6.5–8.1)

## 2020-08-03 LAB — CBC
HCT: 25.4 % — ABNORMAL LOW (ref 39.0–52.0)
Hemoglobin: 7.2 g/dL — ABNORMAL LOW (ref 13.0–17.0)
MCH: 19.1 pg — ABNORMAL LOW (ref 26.0–34.0)
MCHC: 28.3 g/dL — ABNORMAL LOW (ref 30.0–36.0)
MCV: 67.6 fL — ABNORMAL LOW (ref 80.0–100.0)
Platelets: 435 10*3/uL — ABNORMAL HIGH (ref 150–400)
RBC: 3.76 MIL/uL — ABNORMAL LOW (ref 4.22–5.81)
WBC: 7.9 10*3/uL (ref 4.0–10.5)
nRBC: 0 % (ref 0.0–0.2)

## 2020-08-03 MED ORDER — PEG 3350-KCL-NA BICARB-NACL 420 G PO SOLR
4000.0000 mL | Freq: Once | ORAL | Status: AC
  Administered 2020-08-03: 4000 mL via ORAL
  Filled 2020-08-03: qty 4000

## 2020-08-03 MED ORDER — SODIUM CHLORIDE 0.9 % IV SOLN: INTRAVENOUS | Status: DC

## 2020-08-03 NOTE — Progress Notes (Signed)
PROGRESS NOTE  Aaron Roth FAO:[id number] DOB: [birth year] DOA: 08/02/2020 PCP: Patient, No Pcp Per  HPI/Recap of past 24 hours: The patient is a 74 yr old man with past medical history significant for invasive adenocarcinoma of the colon in 2012, status post resection with lymph node invasion, colostomy placement, colostomy removal in 2013, chemotherapy for 6 months and no follow-up thereafter presents with complaints of generalized fatigue and dyspnea of 3 days duration.  Work-up revealed severe anemia with hemoglobin of 3.8 and positive FOBT with concern for GI bleed.  GI consulted and following.  3 units PRBC transfused, repeat hemoglobin 7.2 on 08/03/2020.  Tentative colonoscopy on 08/04/2020.  Ongoing GI prep.    08/03/20: Seen and examined at his bedside.  He denies any abdominal pain, dizziness, chest pain or palpitations.  States his fatigue is improved.  Assessment/Plan: Active Problems:   GERD (gastroesophageal reflux disease)   History of colon cancer, T3, N2 (4/15 nodes), colectomy 09/25/2011.   Symptomatic anemia   GI bleed  Symptomatic severe anemia likely secondary to GI bleed, unspecified Presented with generalized fatigue, dyspnea of 3-day duration, hemoglobin 3.8, positive FOBT Received 3 unit PRBC transfusion Repeated hemoglobin 7.2 on 08/03/2020. Seen by GI, plan for colonoscopy on 08/04/2020. Monitor H&H Transfuse hemoglobin less than 7.0  Severe iron deficiency anemia/chronic microcytic anemia Iron studies indicative of severe iron deficiency MCV 67 this morning with hemoglobin of 7.2. Received IV Feraheme on 08/02/2020. Start ferrous sulfate supplement post colonoscopy.  History of colon cancer status post resection in 2012, lymph node invasion Received 6 months of chemotherapy no follow-up since. Follow-up on colonoscopy results. Appreciate GIs assistance.    Code Status: Full code  Family Communication: None at bedside  Disposition Plan: Home when  hemodynamically stable.   Consultants:  GI  Procedures:  None  Antimicrobials:  None  DVT prophylaxis: SCDs.  Status is: Inpatient    Dispo: The patient is from: Home.              Anticipated d/c is to: Home.              Anticipated d/c date is: 08/05/2020.              Patient currently not stable for discharge due to plan for colonoscopy on 08/04/2020, ongoing management of severe symptomatic anemia with suspected GI bleed.         Objective: Vitals:   08/03/20 0700 08/03/20 0836 08/03/20 0900 08/03/20 1228  BP: (!) 196/86   (!) 180/84  Pulse: 94  84 85  Resp: 20  14 15   Temp:  99 F (37.2 C)  98.5 F (36.9 C)  TempSrc:  Oral  Oral  SpO2: 98%  98% 100%  Weight:    78.1 kg  Height:        Intake/Output Summary (Last 24 hours) at 08/03/2020 1530 Last data filed at 08/03/2020 1000 Gross per 24 hour  Intake 2883.14 ml  Output 1850 ml  Net 1033.14 ml   Filed Weights   08/02/20 1230 08/03/20 1228  Weight: 77.1 kg 78.1 kg    Exam:  . General: 74 y.o. year-old male well developed well nourished in no acute distress.  Alert and oriented x3. . Cardiovascular: Regular rate and rhythm with no rubs or gallops.  No thyromegaly or JVD noted.   Marland Kitchen Respiratory: Clear to auscultation with no wheezes or rales. Good inspiratory effort. . Abdomen: Soft nontender nondistended with normal bowel sounds x4 quadrants. Marland Kitchen  Musculoskeletal: No lower extremity edema. 2/4 pulses in all 4 extremities. . Skin: No ulcerative lesions noted or rashes, . Psychiatry: Mood is appropriate for condition and setting   Data Reviewed: CBC: Recent Labs  Lab 08/02/20 1354 08/03/20 0500  WBC 6.0 7.9  NEUTROABS 3.6  --   HGB 3.8* 7.2*  HCT 15.8* 25.4*  MCV 56.0* 67.6*  PLT 626* 983*   Basic Metabolic Panel: Recent Labs  Lab 08/02/20 1354 08/03/20 0500  NA 140 137  K 3.7 3.6  CL 105 106  CO2 23 23  GLUCOSE 86 80  BUN 12 7*  CREATININE 0.83 0.82  CALCIUM 8.5* 8.3*    GFR: Estimated Creatinine Clearance: 76.6 mL/min (by C-G formula based on SCr of 0.82 mg/dL). Liver Function Tests: Recent Labs  Lab 08/02/20 1354 08/03/20 0500  AST 12* 13*  ALT 9 8  ALKPHOS 51 50  BILITOT 0.6 0.9  PROT 6.7 6.3*  ALBUMIN 3.7 3.4*   No results for input(s): LIPASE, AMYLASE in the last 168 hours. No results for input(s): AMMONIA in the last 168 hours. Coagulation Profile: No results for input(s): INR, PROTIME in the last 168 hours. Cardiac Enzymes: No results for input(s): CKTOTAL, CKMB, CKMBINDEX, TROPONINI in the last 168 hours. BNP (last 3 results) No results for input(s): PROBNP in the last 8760 hours. HbA1C: No results for input(s): HGBA1C in the last 72 hours. CBG: No results for input(s): GLUCAP in the last 168 hours. Lipid Profile: No results for input(s): CHOL, HDL, LDLCALC, TRIG, CHOLHDL, LDLDIRECT in the last 72 hours. Thyroid Function Tests: No results for input(s): TSH, T4TOTAL, FREET4, T3FREE, THYROIDAB in the last 72 hours. Anemia Panel: Recent Labs    08/02/20 1354  VITAMINB12 230  FOLATE 19.9  FERRITIN 3*  TIBC 505*  IRON 35*  RETICCTPCT 2.7   Urine analysis: No results found for: COLORURINE, APPEARANCEUR, LABSPEC, PHURINE, GLUCOSEU, HGBUR, BILIRUBINUR, KETONESUR, PROTEINUR, UROBILINOGEN, NITRITE, LEUKOCYTESUR Sepsis Labs: @LABRCNTIP (procalcitonin:4,lacticidven:4)  ) Recent Results (from the past 240 hour(s))  Respiratory Panel by RT PCR (Flu A&B, Covid) - Nasopharyngeal Swab     Status: None   Collection Time: 08/02/20  2:52 PM   Specimen: Nasopharyngeal Swab  Result Value Ref Range Status   SARS Coronavirus 2 by RT PCR NEGATIVE NEGATIVE Final    Comment: (NOTE) SARS-CoV-2 target nucleic acids are NOT DETECTED.  The SARS-CoV-2 RNA is generally detectable in upper respiratoy specimens during the acute phase of infection. The lowest concentration of SARS-CoV-2 viral copies this assay can detect is 131 copies/mL. A negative  result does not preclude SARS-Cov-2 infection and should not be used as the sole basis for treatment or other patient management decisions. A negative result may occur with  improper specimen collection/handling, submission of specimen other than nasopharyngeal swab, presence of viral mutation(s) within the areas targeted by this assay, and inadequate number of viral copies (<131 copies/mL). A negative result must be combined with clinical observations, patient history, and epidemiological information. The expected result is Negative.  Fact Sheet for Patients:  PinkCheek.be  Fact Sheet for Healthcare Providers:  GravelBags.it  This test is no t yet approved or cleared by the [place] FDA and  has been authorized for detection and/or diagnosis of SARS-CoV-2 by FDA under an Emergency Use Authorization (EUA). This EUA will remain  in effect (meaning this test can be used) for the duration of the COVID-19 declaration under Section 564(b)(1) of the Act, 21 U.S.C. section 360bbb-3(b)(1), unless the authorization is terminated  or revoked sooner.     Influenza A by PCR NEGATIVE NEGATIVE Final   Influenza B by PCR NEGATIVE NEGATIVE Final    Comment: (NOTE) The Xpert Xpress SARS-CoV-2/FLU/RSV assay is intended as an aid in  the diagnosis of influenza from Nasopharyngeal swab specimens and  should not be used as a sole basis for treatment. Nasal washings and  aspirates are unacceptable for Xpert Xpress SARS-CoV-2/FLU/RSV  testing.  Fact Sheet for Patients: PinkCheek.be  Fact Sheet for Healthcare Providers: GravelBags.it  This test is not yet approved or cleared by the [place] FDA and  has been authorized for detection and/or diagnosis of SARS-CoV-2 by  FDA under an Emergency Use Authorization (EUA). This EUA will remain  in effect (meaning this test can be used)  for the duration of the  Covid-19 declaration under Section 564(b)(1) of the Act, 21  U.S.C. section 360bbb-3(b)(1), unless the authorization is  terminated or revoked. Performed at [facility], [place] [address], [place], [place] [address]   MRSA PCR Screening     Status: None   Collection Time: 08/02/20  6:08 PM   Specimen: Nasal Mucosa; Nasopharyngeal  Result Value Ref Range Status   MRSA by PCR NEGATIVE NEGATIVE Final    Comment:        The GeneXpert MRSA Assay (FDA approved for NASAL specimens only), is one component of a comprehensive MRSA colonization surveillance program. It is not intended to diagnose MRSA infection nor to guide or monitor treatment for MRSA infections. Performed at [facility], [place] [address] [place], [place] [address]       Studies: CT ABDOMEN PELVIS W CONTRAST  Result Date: 08/02/2020 CLINICAL DATA:  Anemia. Hemoccult positive. History of colon cancer 2012. EXAM: CT ABDOMEN AND PELVIS WITH CONTRAST TECHNIQUE: Multidetector CT imaging of the abdomen and pelvis was performed using the standard protocol following bolus administration of intravenous contrast. CONTRAST:  161mL OMNIPAQUE IOHEXOL 300 MG/ML  SOLN COMPARISON:  Most recent available CT 06/06/2012 FINDINGS: Lower chest: Trace right pleural fluid. Mild subpleural opacities in the dependent lower lobes right greater than. Upper normal heart size. Hepatobiliary: No focal hepatic abnormality. Unremarkable gallbladder without calcified gallstone or pericholecystic inflammation. Pancreas: No ductal dilatation or inflammation. Spleen: Normal in size without focal abnormality. Adrenals/Urinary Tract: Normal adrenal glands. No hydronephrosis or perinephric edema. Homogeneous renal enhancement with symmetric excretion on delayed phase imaging. Minimally distended urinary bladder without evident bladder wall thickening. Stomach/Bowel: Bowel evaluation is limited in the  absence of enteric contrast. Stomach is nondistended. Normal positioning of the duodenum and ligament of Treitz. No obvious gastric or duodenal inflammation. There is no small bowel obstruction or inflammatory change. Appendix visualized and normal. Small volume of stool throughout the colon. Probable postsurgical change involving the left colon. There is no colonic wall thickening or evidence of focal colonic mass. Vascular/Lymphatic: Mild aortic atherosclerosis. No aneurysm. No bulky abdominopelvic adenopathy. Reproductive: Prominent prostate gland spanning 5.1 cm transverse. Other: Small amount of free fluid in the pelvis is nonspecific. No free air or focal fluid collection. Postsurgical changes the anterior abdominal wall. Musculoskeletal: Degenerative change throughout the spine and both hips. There are no acute or suspicious osseous abnormalities. IMPRESSION: 1. Small amount of free fluid in the pelvis is nonspecific. 2. Probable postsurgical change involving the left colon. No bowel inflammation or explanation for GI bleed. 3. Trace right pleural fluid. Mild subpleural opacities in the dependent lower lobes right greater than left, may represent atelectasis or potentially  interstitial lung disease. 4. Prominent prostate gland. Aortic Atherosclerosis (ICD10-I70.0). Electronically Signed   By: Keith Rake M.D.   On: 08/02/2020 17:15   DG Chest Portable 1 View  Result Date: 08/02/2020 CLINICAL DATA:  Shortness of breath and weakness. EXAM: PORTABLE CHEST 1 VIEW COMPARISON:  November 10, 2011 FINDINGS: The heart size and mediastinal contours are within normal limits. Both lungs are clear. There is tortuosity of the descending thoracic aorta. The visualized skeletal structures are unremarkable. IMPRESSION: No active disease. Electronically Signed   By: Virgina Norfolk M.D.   On: 08/02/2020 16:22    Scheduled Meds: . Chlorhexidine Gluconate Cloth  6 each Topical Daily  . mouth rinse  15 mL Mouth  Rinse BID    Continuous Infusions:   LOS: 1 day     Kayleen Memos, MD [facility] Pager [phone]  If 7PM-7AM, please contact night-coverage [email or web address] Password [email or web address] 08/03/2020, 3:30 PM

## 2020-08-03 NOTE — Anesthesia Preprocedure Evaluation (Addendum)
Anesthesia Evaluation  Patient identified by MRN, date of birth, ID band Patient awake    Reviewed: Allergy & Precautions, NPO status , Patient's Chart, lab work & pertinent test results  Airway Mallampati: II       Dental  (+) Edentulous Upper, Poor Dentition, Loose, Dental Advisory Given,    Pulmonary Current Smoker and Patient abstained from smoking.,    Pulmonary exam normal breath sounds clear to auscultation       Cardiovascular negative cardio ROS   Rhythm:Regular Rate:Normal     Neuro/Psych negative neurological ROS  negative psych ROS   GI/Hepatic Neg liver ROS, GERD  Medicated,Heme + stools  Hx/o colon Ca S/P colectomy and colostomy w/ reversal S/p chemoRx   Endo/Other  negative endocrine ROS  Renal/GU negative Renal ROS  negative genitourinary   Musculoskeletal negative musculoskeletal ROS (+)   Abdominal   Peds  Hematology  (+) anemia ,   Anesthesia Other Findings   Reproductive/Obstetrics                            Anesthesia Physical Anesthesia Plan  ASA: II  Anesthesia Plan: MAC   Post-op Pain Management:    Induction: Intravenous  PONV Risk Score and Plan: 0 and Propofol infusion  Airway Management Planned: Natural Airway and Simple Face Mask  Additional Equipment:   Intra-op Plan:   Post-operative Plan:   Informed Consent: I have reviewed the patients History and Physical, chart, labs and discussed the procedure including the risks, benefits and alternatives for the proposed anesthesia with the patient or authorized representative who has indicated his/her understanding and acceptance.     Dental advisory given  Plan Discussed with: CRNA and Anesthesiologist  Anesthesia Plan Comments:        Anesthesia Quick Evaluation

## 2020-08-03 NOTE — Progress Notes (Signed)
Called patient's spouse on home and cell phone to let her know patient has moved to [place], no answer at either number

## 2020-08-03 NOTE — Progress Notes (Signed)
Patient's hemoglobin came up nicely from 3.8  to 7.2 following transfusion of 3 units of packed cells which finished early this morning.  Amazingly, this 74 year old gentleman was basically asymptomatic from his anemia, and therefore, does not feel noticeably better today since getting his blood transfusion.  I have reviewed the nature, purpose, and risks of upper endoscopy and colonoscopy for evaluation of his anemia, as well as alternatives of radiographic evaluation.  I have recommended endoscopy and colonoscopy and he is agreeable.  Prep orders written, we will plan for his exam midday tomorrow.  His wife came in during the visit and therefore is aware of the plan.  If his endoscopy and colonoscopy are negative, we might want to consider doing a capsule endoscopy as an outpatient.  Cleotis Nipper, M.D. Pager [phone] If no answer or after 5 PM call [phone]

## 2020-08-03 NOTE — Progress Notes (Addendum)
Pt does not want to be hooked up to the monitor and wants to be able to get up and move when he wants. I notified Dr Nevada Crane that patient is requesting to be moved to another unit.

## 2020-08-04 ENCOUNTER — Inpatient Hospital Stay (HOSPITAL_COMMUNITY): Payer: Medicare Other | Admitting: Anesthesiology

## 2020-08-04 ENCOUNTER — Encounter (HOSPITAL_COMMUNITY)
Admission: EM | Disposition: A | Discharge status: Home / Self Care | Payer: Self-pay | Source: Ambulatory Visit | Attending: Internal Medicine

## 2020-08-04 ENCOUNTER — Encounter (HOSPITAL_COMMUNITY): Payer: Self-pay | Admitting: Internal Medicine

## 2020-08-04 ENCOUNTER — Inpatient Hospital Stay (HOSPITAL_COMMUNITY): Payer: Medicare Other

## 2020-08-04 HISTORY — PX: ESOPHAGOGASTRODUODENOSCOPY (EGD) WITH PROPOFOL: SHX5813

## 2020-08-04 HISTORY — PX: POLYPECTOMY: SHX5525

## 2020-08-04 HISTORY — PX: COLONOSCOPY WITH PROPOFOL: SHX5780

## 2020-08-04 HISTORY — PX: BIOPSY: SHX5522

## 2020-08-04 LAB — TYPE AND SCREEN
ABO/RH(D): O POS
Antibody Screen: NEGATIVE
Unit division: 0
Unit division: 0
Unit division: 0

## 2020-08-04 LAB — BPAM RBC
Blood Product Expiration Date: 202110282359
Blood Product Expiration Date: 202110292359
Blood Product Expiration Date: 202110292359
ISSUE DATE / TIME: 202110011700
ISSUE DATE / TIME: 202110012032
ISSUE DATE / TIME: 202110020041
Unit Type and Rh: 5100
Unit Type and Rh: 5100
Unit Type and Rh: 5100

## 2020-08-04 LAB — CBC
HCT: 28.6 % — ABNORMAL LOW (ref 39.0–52.0)
Hemoglobin: 8.2 g/dL — ABNORMAL LOW (ref 13.0–17.0)
MCH: 19.6 pg — ABNORMAL LOW (ref 26.0–34.0)
MCHC: 28.7 g/dL — ABNORMAL LOW (ref 30.0–36.0)
MCV: 68.4 fL — ABNORMAL LOW (ref 80.0–100.0)
Platelets: 565 10*3/uL — ABNORMAL HIGH (ref 150–400)
RBC: 4.18 MIL/uL — ABNORMAL LOW (ref 4.22–5.81)
WBC: 8.3 10*3/uL (ref 4.0–10.5)
nRBC: 0 % (ref 0.0–0.2)

## 2020-08-04 SURGERY — ESOPHAGOGASTRODUODENOSCOPY (EGD) WITH PROPOFOL
Anesthesia: Monitor Anesthesia Care

## 2020-08-04 MED ORDER — SODIUM CHLORIDE 0.9 % IV SOLN
510.0000 mg | Freq: Once | INTRAVENOUS | Status: AC
  Administered 2020-08-04: 510 mg via INTRAVENOUS
  Filled 2020-08-04: qty 510

## 2020-08-04 MED ORDER — PROPOFOL 500 MG/50ML IV EMUL
INTRAVENOUS | Status: DC | PRN
  Administered 2020-08-04: 30 mg via INTRAVENOUS

## 2020-08-04 MED ORDER — FERROUS SULFATE 325 (65 FE) MG PO TABS: 325.0000 mg | ORAL_TABLET | Freq: Every day | ORAL | Status: DC

## 2020-08-04 MED ORDER — LACTATED RINGERS IV SOLN: INTRAVENOUS | Status: DC | PRN

## 2020-08-04 MED ORDER — ONDANSETRON HCL 4 MG/2ML IJ SOLN: 4.0000 mg | Freq: Once | INTRAMUSCULAR | Status: DC | PRN

## 2020-08-04 MED ORDER — IOHEXOL 300 MG/ML  SOLN
75.0000 mL | Freq: Once | INTRAMUSCULAR | Status: AC | PRN
  Administered 2020-08-04: 75 mL via INTRAVENOUS

## 2020-08-04 MED ORDER — AMLODIPINE BESYLATE 5 MG PO TABS: 5.0000 mg | ORAL_TABLET | Freq: Every day | ORAL | Status: DC

## 2020-08-04 MED ORDER — PROPOFOL 500 MG/50ML IV EMUL
INTRAVENOUS | Status: DC | PRN
  Administered 2020-08-04: 150 ug/kg/min via INTRAVENOUS

## 2020-08-04 MED ORDER — AMLODIPINE BESYLATE 5 MG PO TABS: 5.0000 mg | ORAL_TABLET | Freq: Every day | ORAL | 0 refills | Status: DC

## 2020-08-04 MED ORDER — FERROUS SULFATE 325 (65 FE) MG PO TABS: 325.0000 mg | ORAL_TABLET | Freq: Every day | ORAL | 0 refills | Status: DC

## 2020-08-04 MED ORDER — PROPOFOL 1000 MG/100ML IV EMUL
INTRAVENOUS | Status: AC
  Filled 2020-08-04: qty 100

## 2020-08-04 MED ORDER — ENOXAPARIN SODIUM 40 MG/0.4ML ~~LOC~~ SOLN: 40.0000 mg | SUBCUTANEOUS | Status: DC

## 2020-08-04 SURGICAL SUPPLY — 24 items

## 2020-08-04 NOTE — Progress Notes (Signed)
Patient discharged to home with family, discharge instructions reviewed with patient, wife and son, who verbalized understanding.

## 2020-08-04 NOTE — Progress Notes (Addendum)
Patient's colonoscopy shows a large, friable cecal mass, strongly suggestive of colon cancer.  This is the likely cause of his anemia, because his upper endoscopy was negative.  He also had several small polyps which were removed by cold snare or cold biopsy.  Recommend:  1.  I have ordered a chest CT, as well as a CEA for the purpose of completing his staging.  2.  I have ordered a follow-up CBC  3.  I have ordered a regular diet  4.  In my opinion, the patient is clinically stable to be discharged.  Case discussed with Dr. Irene Pap.  As long as the follow-up hemoglobin is above 6.5, I think further transfusion is not needed, since he is tolerating the anemia so well and since the IV iron he was recently given should be taking effect soon and he can build his blood count as an outpatient.  Oral iron supplementation, at least in low doses, would also be appropriate at time of discharge.  That way, his hemoglobin will hopefully be on its way up by the time he has surgery, perhaps a couple of weeks from now.  I have gone over the above findings and plan with the patient and he is agreeable.  I have also gone over these findings with the patient's wife, by telephone.  My office will contact him to make a follow-up appointment with me for a few days from now to go over his biopsy results, and we will take care of making a surgical referral.  Cleotis Nipper, M.D. Pager [phone] If no answer or after 5 PM call [phone]

## 2020-08-04 NOTE — Discharge Instructions (Signed)
Anemia  Anemia is a condition in which you do not have enough red blood cells or hemoglobin. Hemoglobin is a substance in red blood cells that carries oxygen. When you do not have enough red blood cells or hemoglobin (are anemic), your body cannot get enough oxygen and your organs may not work properly. As a result, you may feel very tired or have other problems. What are the causes? Common causes of anemia include:  Excessive bleeding. Anemia can be caused by excessive bleeding inside or outside the body, including bleeding from the intestine or from periods in women.  Poor nutrition.  Long-lasting (chronic) kidney, thyroid, and liver disease.  Bone marrow disorders.  Cancer and treatments for cancer.  HIV (human immunodeficiency virus) and AIDS (acquired immunodeficiency syndrome).  Treatments for HIV and AIDS.  Spleen problems.  Blood disorders.  Infections, medicines, and autoimmune disorders that destroy red blood cells. What are the signs or symptoms? Symptoms of this condition include:  Minor weakness.  Dizziness.  Headache.  Feeling heartbeats that are irregular or faster than normal (palpitations).  Shortness of breath, especially with exercise.  Paleness.  Cold sensitivity.  Indigestion.  Nausea.  Difficulty sleeping.  Difficulty concentrating. Symptoms may occur suddenly or develop slowly. If your anemia is mild, you may not have symptoms. How is this diagnosed? This condition is diagnosed based on:  Blood tests.  Your medical history.  A physical exam.  Bone marrow biopsy. Your health care provider may also check your stool (feces) for blood and may do additional testing to look for the cause of your bleeding. You may also have other tests, including:  Imaging tests, such as a CT scan or MRI.  Endoscopy.  Colonoscopy. How is this treated? Treatment for this condition depends on the cause. If you continue to lose a lot of blood, you may  need to be treated at a hospital. Treatment may include:  Taking supplements of iron, vitamin S31, or folic acid.  Taking a hormone medicine (erythropoietin) that can help to stimulate red blood cell growth.  Having a blood transfusion. This may be needed if you lose a lot of blood.  Making changes to your diet.  Having surgery to remove your spleen. Follow these instructions at home:  Take over-the-counter and prescription medicines only as told by your health care provider.  Take supplements only as told by your health care provider.  Follow any diet instructions that you were given.  Keep all follow-up visits as told by your health care provider. This is important. Contact a health care provider if:  You develop new bleeding anywhere in the body. Get help right away if:  You are very weak.  You are short of breath.  You have pain in your abdomen or chest.  You are dizzy or feel faint.  You have trouble concentrating.  You have bloody or black, tarry stools.  You vomit repeatedly or you vomit up blood. Summary  Anemia is a condition in which you do not have enough red blood cells or enough of a substance in your red blood cells that carries oxygen (hemoglobin).  Symptoms may occur suddenly or develop slowly.  If your anemia is mild, you may not have symptoms.  This condition is diagnosed with blood tests as well as a medical history and physical exam. Other tests may be needed.  Treatment for this condition depends on the cause of the anemia. This information is not intended to replace advice given to you by  your health care provider. Make sure you discuss any questions you have with your health care provider. Document Revised: 10/01/2017 Document Reviewed: 11/20/2016 Elsevier Patient Education  [place].

## 2020-08-04 NOTE — Op Note (Signed)
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 Estimated Blood Loss:     Estimated blood loss was minimal. Procedure:                Pre-Anesthesia Assessment:                           - Prior to the procedure, a History and Physical                            was performed, and patient medications and                            allergies were reviewed. The patient's tolerance of                            previous anesthesia was also reviewed. The risks                            and benefits of the procedure and the sedation                            options and risks were discussed with the patient.                            All questions were answered, and informed consent                            was obtained. Prior Anticoagulants: The patient has  taken no previous anticoagulant or antiplatelet                            agents. ASA Grade Assessment: II - A patient with                            mild systemic disease. After reviewing the risks                            and benefits, the patient was deemed in                            satisfactory condition to undergo the procedure.                            After obtaining informed consent, the colonoscope                            was passed under direct vision. Throughout the                            procedure, the patient's blood pressure, pulse, and                            oxygen saturations were monitored continuously. The                            CF-HQ190L ([id number]) Olympus colonoscope was                            introduced through the anus and advanced to the the                            terminal ileum. The colonoscopy was performed                            without difficulty. The patient tolerated the                            procedure well. The quality of the bowel                            preparation was excellent. Scope In: 10:32:33 AM Scope Out: 10:59:31 AM Scope Withdrawal Time: 0 hours 20 minutes 3 seconds  Total Procedure Duration: 0 hours 26 minutes 58 seconds  Findings:      The perianal and digital rectal examinations were normal. Pertinent       negatives include firm prostate without nodules.      An ulcerated, friable large mass was found in the cecum. Biopsies were       taken with a cold forceps for histology. Estimated blood loss was       minimal.      A 3 mm polyp was found in the ascending colon. The polyp was sessile.       Biopsies were taken with  a cold forceps for histology.      A 9 mm polyp was found in the ascending colon. The polyp was       semi-pedunculated. The polyp was removed with a cold snare. Resection       and retrieval were complete. Estimated blood loss was minimal.      A 3 mm polyp was found in the transverse colon. The polyp was sessile.       Biopsies were taken with a cold forceps for histology.      Four sessile polyps were found in the transverse colon. The polyps were       small in size. These polyps were removed with a cold snare. Resection       and retrieval were complete. Estimated blood loss was minimal.      The exam was otherwise normal  throughout the examined colon.      There is no endoscopic evidence of diverticula, inflammation or       angiodysplasia in the entire colon.      The terminal ileum appeared normal.      The retroflexed view of the distal rectum and anal verge was normal and       showed no anal or rectal abnormalities. Reinspection of the rectum       showed no additional findings. 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                            Biopsied. This likely accounts for patient's iron                            deficiency anemia.                           - One 3 mm polyp in the ascending colon. Biopsied.                           - One 9 mm polyp in the ascending colon, removed                            with a cold snare. Resected and retrieved.                           - One 3 mm polyp in the transverse colon. Biopsied.                           - Four small polyps in the transverse colon,                            removed with a cold snare. Resected and retrieved.                           - The examined portion of the ileum was normal.                           - The distal rectum and anal verge are normal  on                            retroflexion view. Moderate Sedation:      This patient was sedated with monitored anesthesia care, not moderate       sedation. Recommendation:           - Await pathology results. Procedure Code(s):        --- Professional ---                           [phone], Colonoscopy, flexible; with removal of                            tumor(s), polyp(s), or other lesion(s) by snare                            technique                           45380, 15, Colonoscopy, flexible; with biopsy,                            single or multiple Diagnosis Code(s):        --- Professional ---                           D49.0, Neoplasm of unspecified behavior of                            digestive system                            K63.5, Polyp of colon                           D50.0, Iron deficiency anemia secondary to blood                            loss (chronic) CPT copyright 2019 American Medical Association. All rights reserved. The codes documented in this report are preliminary and upon coder review may  be revised to meet current compliance requirements. [name], MD 08/04/2020 11:57:16 AM This report has been signed electronically. Number of Addenda: 0

## 2020-08-04 NOTE — Op Note (Signed)
[facility] Patient Name: Aaron Roth Procedure Date: 08/04/2020 MRN: [id number] Attending MD: [name] , MD Date of Birth: 02/22/1946 CSN: [id number] Age: 74 Admit Type: Inpatient Procedure:                Upper GI endoscopy Indications:              Iron deficiency anemia secondary to chronic blood                            loss Providers:                [name], MD, Clyde Lundborg, RN, Tyrone Apple, Technician, Herbie Drape, CRNA Referring MD:              Medicines:                Monitored Anesthesia Care Complications:            No immediate complications. Estimated Blood Loss:     Estimated blood loss was minimal. Procedure:                Pre-Anesthesia Assessment:                           - Prior to the procedure, a History and Physical                            was performed, and patient medications and                            allergies were reviewed. The patient's tolerance of                            previous anesthesia was also reviewed. The risks                            and benefits of the procedure and the sedation                            options and risks were discussed with the patient.                            All questions were answered, and informed consent                            was obtained. Prior Anticoagulants: The patient has                            taken no previous anticoagulant or antiplatelet                            agents. ASA Grade Assessment: II - A patient with  mild systemic disease. After reviewing the risks                            and benefits, the patient was deemed in                            satisfactory condition to undergo the procedure.                           After obtaining informed consent, the endoscope was                            passed under direct vision. Throughout the                            procedure, the  patient's blood pressure, pulse, and                            oxygen saturations were monitored continuously. The                            [id number]-H190 ([id number]) Olympus gastroscope was                            introduced through the mouth, and advanced to the                            second part of duodenum. The upper GI endoscopy was                            accomplished without difficulty. The patient                            tolerated the procedure well. Scope In: Scope Out: Findings:      The examined esophagus was normal.      There is no endoscopic evidence of any significant hiatal hernia in the       distal esophagus (an intermittent small hiatal hernia was present).      The entire examined stomach was normal.      The cardia and gastric fundus were normal on retroflexion.      The examined duodenum was normal. Biopsies for histology were taken with       a cold forceps for evaluation of celiac disease. Impression:               - Normal esophagus.                           - Normal stomach.                           - Normal examined duodenum. Biopsied. Moderate Sedation:      This patient was sedated with monitored anesthesia care, not moderate       sedation. Recommendation:           - Await pathology results.                           -  Perform a colonoscopy today. Procedure Code(s):        --- Professional ---                           [phone], Esophagogastroduodenoscopy, flexible,                            transoral; with biopsy, single or multiple Diagnosis Code(s):        --- Professional ---                           D50.0, Iron deficiency anemia secondary to blood                            loss (chronic) CPT copyright 2019 American Medical Association. All rights reserved. The codes documented in this report are preliminary and upon coder review may  be revised to meet current compliance requirements. [name], MD 08/04/2020 11:23:42 AM This  report has been signed electronically. Number of Addenda: 0

## 2020-08-04 NOTE — TOC Progression Note (Signed)
Transition of Care [facility]) - Progression Note    Patient Details  Name: Aaron Roth MRN: [id number] Date of Birth: 10/10/1946  [facility] of [facility] CM/SW Contact  Servando Snare,  Phone Number: 08/04/2020, 3:27 PM  Clinical Narrative:   TOC received consult for assistance with medication. However, patient has insurance. No assistance available for co-pays.          Expected Discharge Plan and Services           Expected Discharge Date: 08/04/20                                     Social Determinants of Health (SDOH) Interventions    Readmission Risk Interventions No flowsheet data found.

## 2020-08-04 NOTE — Interval H&P Note (Signed)
History and Physical Interval Note:  08/04/2020 10:08 AM  Aaron Roth  has presented today for surgery, with the diagnosis of Heme positive stool and iron deficiency anemia.  The various methods of treatment have been discussed with the patient and family. After consideration of risks, benefits and other options for treatment, the patient has consented to  Procedure(s): ESOPHAGOGASTRODUODENOSCOPY (EGD) WITH PROPOFOL (N/A) COLONOSCOPY WITH PROPOFOL (N/A) as a surgical intervention.  The patient's history has been reviewed, patient examined, no change in status, stable for surgery.  I have reviewed the patient's chart and labs.  Questions were answered to the patient's satisfaction.     Youlanda Mighty Aaron Roth

## 2020-08-04 NOTE — Transfer of Care (Signed)
Immediate Anesthesia Transfer of Care Note  Patient: Aaron Roth  Procedure(s) Performed: ESOPHAGOGASTRODUODENOSCOPY (EGD) WITH PROPOFOL (N/A ) COLONOSCOPY WITH PROPOFOL (N/A ) BIOPSY POLYPECTOMY  Patient Location: PACU  Anesthesia Type:General  Level of Consciousness: sedated, patient cooperative and responds to stimulation  Airway & Oxygen Therapy: Patient Spontanous Breathing and Patient connected to face mask oxygen  Post-op Assessment: Report given to RN and Post -op Vital signs reviewed and stable  Post vital signs: Reviewed and stable  Last Vitals:  Vitals Value Taken Time  BP    Temp    Pulse 78 08/04/20 1109  Resp    SpO2 100 % 08/04/20 1109  Vitals shown include unvalidated device data.  Last Pain:  Vitals:   08/04/20 1005  TempSrc: Oral  PainSc: 0-No pain         Complications: No complications documented.

## 2020-08-04 NOTE — Anesthesia Postprocedure Evaluation (Signed)
Anesthesia Post Note  Patient: CHARLEY MISKE  Procedure(s) Performed: ESOPHAGOGASTRODUODENOSCOPY (EGD) WITH PROPOFOL (N/A ) COLONOSCOPY WITH PROPOFOL (N/A ) BIOPSY POLYPECTOMY     Patient location during evaluation: PACU Anesthesia Type: MAC Level of consciousness: awake and alert Pain management: pain level controlled Vital Signs Assessment: post-procedure vital signs reviewed and stable Respiratory status: spontaneous breathing, nonlabored ventilation and respiratory function stable Cardiovascular status: stable and blood pressure returned to baseline Postop Assessment: no apparent nausea or vomiting Anesthetic complications: no   No complications documented.  Last Vitals:  Vitals:   08/04/20 1109 08/04/20 1115  BP: (!) 98/55 (!) 121/50  Pulse: 81 74  Resp: 20 20  Temp: 36.8 C   SpO2: 100% 100%    Last Pain:  Vitals:   08/04/20 1109  TempSrc:   PainSc: Asleep                 Nikolaus Pienta A.

## 2020-08-04 NOTE — Progress Notes (Addendum)
PROGRESS NOTE  Aaron Roth QBH:[id number] DOB: 18-Jul-1946 DOA: 08/02/2020 PCP: Patient, No Pcp Per  HPI/Recap of past 24 hours: The patient is a 74 yr old man with past medical history significant for invasive adenocarcinoma of the colon in 2012, status post resection with lymph node invasion, colostomy placement, colostomy removal in 2013, chemotherapy for 6 months and no follow-up thereafter presents with complaints of generalized fatigue and dyspnea of 3 days duration.  Work-up revealed severe anemia with hemoglobin of 3.8 and positive FOBT with concern for GI bleed.  GI consulted and following.  3 units PRBC transfused, repeat hemoglobin 7.2 on 08/03/2020.  Colonoscopy on 08/04/2020.     08/04/20: Seen and examined at his bedside this morning prior to his EGD and colonoscopy.  He had no complaints.  He denies any abdominal pain, dizziness, palpitation, chest pain or dyspnea.    Assessment/Plan: Active Problems:   GERD (gastroesophageal reflux disease)   History of colon cancer, T3, N2 (4/15 nodes), colectomy 09/25/2011.   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 Cristina Gong Presented with generalized fatigue, dyspnea of 3-day duration, hemoglobin 3.8, positive FOBT Received 3 unit PRBC transfusion Repeated hemoglobin 7.2 on 08/03/2020. Seen by GI, colonoscopy on 08/04/2020. Colonoscopy revealed cecal mass, likely accounting for his anemia. Continue to monitor H&H Transfuse hemoglobin less than 7.0  Newly diagnosed cecal mass, seen on colonoscopy done on 08/04/2020 by Dr. Cristina Gong, GI. 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 Did not receive IV Feraheme on 08/02/2020.  Ordered on 08/04/20. Start ferrous sulfate supplement post colonoscopy.  History of colon cancer status post resection in 2012, lymph node invasion Received 6 months of chemotherapy no follow-up since. Appreciate GIs assistance.    Code Status: Full code  Family Communication: None at bedside  Disposition Plan: Home when hemodynamically stable.   Consultants:  GI  Procedures:  None  Antimicrobials:  None  DVT prophylaxis: SCDs.  Resume subcu Lovenox tomorrow 08/05/2020.  Status is: Inpatient    Dispo: The patient is from: Home.              Anticipated d/c is to: Home.              Anticipated d/c date is: 08/06/2020.              Patient currently not stable for discharge due to ongoing management of new cecal mass and acute on chronic blood loss anemia.         Objective: Vitals:   08/04/20 1109 08/04/20 1115 08/04/20 1130 08/04/20 1149  BP: (!) 98/55 (!) 121/50 (!) 129/94 (!) 162/81  Pulse: 81 74 80 69  Resp: 20 20 (!) 23 16  Temp: 98.2 F (36.8 C)   98.7 F (37.1 C)  TempSrc:    Oral  SpO2: 100% 100% 100% 100%  Weight:      Height:        Intake/Output Summary (Last 24 hours) at 08/04/2020 1315 Last data filed at 08/04/2020 1206 Gross per 24 hour  Intake 200 ml  Output 150 ml  Net 50 ml   Filed Weights   08/02/20 1230 08/03/20  1228 08/04/20 1005  Weight: 77.1 kg 78.1 kg 78.1 kg    Exam:   General: 74 y.o. year-old male well developed well nourished in no acute distress.  Alert and oriented times 3.  Cardiovascular: Regular rate and rhythm no rubs or gallops.    Respiratory: Clear to auscultation with no wheezes or rales.  Good inspiratory effort.  Abdomen: Soft nontender normal bowel sounds present.    Musculoskeletal: No lower extremity edema bilaterally.    Psychiatry: Mood is appropriate for condition   Data Reviewed: CBC: Recent Labs  Lab 08/02/20 1354  08/03/20 0500  WBC 6.0 7.9  NEUTROABS 3.6  --   HGB 3.8* 7.2*  HCT 15.8* 25.4*  MCV 56.0* 67.6*  PLT 626* 389*   Basic Metabolic Panel: Recent Labs  Lab 08/02/20 1354 08/03/20 0500  NA 140 137  K 3.7 3.6  CL 105 106  CO2 23 23  GLUCOSE 86 80  BUN 12 7*  CREATININE 0.83 0.82  CALCIUM 8.5* 8.3*   GFR: Estimated Creatinine Clearance: 76.6 mL/min (by C-G formula based on SCr of 0.82 mg/dL). Liver Function Tests: Recent Labs  Lab 08/02/20 1354 08/03/20 0500  AST 12* 13*  ALT 9 8  ALKPHOS 51 50  BILITOT 0.6 0.9  PROT 6.7 6.3*  ALBUMIN 3.7 3.4*   No results for input(s): LIPASE, AMYLASE in the last 168 hours. No results for input(s): AMMONIA in the last 168 hours. Coagulation Profile: No results for input(s): INR, PROTIME in the last 168 hours. Cardiac Enzymes: No results for input(s): CKTOTAL, CKMB, CKMBINDEX, TROPONINI in the last 168 hours. BNP (last 3 results) No results for input(s): PROBNP in the last 8760 hours. HbA1C: No results for input(s): HGBA1C in the last 72 hours. CBG: No results for input(s): GLUCAP in the last 168 hours. Lipid Profile: No results for input(s): CHOL, HDL, LDLCALC, TRIG, CHOLHDL, LDLDIRECT in the last 72 hours. Thyroid Function Tests: No results for input(s): TSH, T4TOTAL, FREET4, T3FREE, THYROIDAB in the last 72 hours. Anemia Panel: Recent Labs    08/02/20 1354  VITAMINB12 230  FOLATE 19.9  FERRITIN 3*  TIBC 505*  IRON 35*  RETICCTPCT 2.7   Urine analysis: No results found for: COLORURINE, APPEARANCEUR, LABSPEC, PHURINE, GLUCOSEU, HGBUR, BILIRUBINUR, KETONESUR, PROTEINUR, UROBILINOGEN, NITRITE, LEUKOCYTESUR Sepsis Labs: @LABRCNTIP (procalcitonin:4,lacticidven:4)  ) Recent Results (from the past 240 hour(s))  Respiratory Panel by RT PCR (Flu A&B, Covid) - Nasopharyngeal Swab     Status: None   Collection Time: 08/02/20  2:52 PM   Specimen: Nasopharyngeal Swab  Result Value Ref Range Status   SARS Coronavirus 2 by RT  PCR NEGATIVE NEGATIVE Final    Comment: (NOTE) SARS-CoV-2 target nucleic acids are NOT DETECTED.  The SARS-CoV-2 RNA is generally detectable in upper respiratoy specimens during the acute phase of infection. The lowest concentration of SARS-CoV-2 viral copies this assay can detect is 131 copies/mL. A negative result does not preclude SARS-Cov-2 infection and should not be used as the sole basis for treatment or other patient management decisions. A negative result may occur with  improper specimen collection/handling, submission of specimen other than nasopharyngeal swab, presence of viral mutation(s) within the areas targeted by this assay, and inadequate number of viral copies (<131 copies/mL). A negative result must be combined with clinical observations, patient history, and epidemiological information. The expected result is Negative.  Fact Sheet for Patients:  PinkCheek.be  Fact Sheet for Healthcare Providers:  GravelBags.it  This test is no t yet approved or  cleared by the [place] and  has been authorized for detection and/or diagnosis of SARS-CoV-2 by FDA under an Emergency Use Authorization (EUA). This EUA will remain  in effect (meaning this test can be used) for the duration of the COVID-19 declaration under Section 564(b)(1) of the Act, 21 U.S.C. section 360bbb-3(b)(1), unless the authorization is terminated or revoked sooner.     Influenza A by PCR NEGATIVE NEGATIVE Final   Influenza B by PCR NEGATIVE NEGATIVE Final    Comment: (NOTE) The Xpert Xpress SARS-CoV-2/FLU/RSV assay is intended as an aid in  the diagnosis of influenza from Nasopharyngeal swab specimens and  should not be used as a sole basis for treatment. Nasal washings and  aspirates are unacceptable for Xpert Xpress SARS-CoV-2/FLU/RSV  testing.  Fact Sheet for Patients: PinkCheek.be  Fact Sheet for  Healthcare Providers: GravelBags.it  This test is not yet approved or cleared by the [place] FDA and  has been authorized for detection and/or diagnosis of SARS-CoV-2 by  FDA under an Emergency Use Authorization (EUA). This EUA will remain  in effect (meaning this test can be used) for the duration of the  Covid-19 declaration under Section 564(b)(1) of the Act, 21  U.S.C. section 360bbb-3(b)(1), unless the authorization is  terminated or revoked. Performed at [facility], [place] [address] [place], [place] [address]   MRSA PCR Screening     Status: None   Collection Time: 08/02/20  6:08 PM   Specimen: Nasal Mucosa; Nasopharyngeal  Result Value Ref Range Status   MRSA by PCR NEGATIVE NEGATIVE Final    Comment:        The GeneXpert MRSA Assay (FDA approved for NASAL specimens only), is one component of a comprehensive MRSA colonization surveillance program. It is not intended to diagnose MRSA infection nor to guide or monitor treatment for MRSA infections. Performed at [facility], [place] [address] [place], [place] [address]       Studies: No results found.  Scheduled Meds:  amLODipine  5 mg Oral Daily   Chlorhexidine Gluconate Cloth  6 each Topical Daily   mouth rinse  15 mL Mouth Rinse BID    Continuous Infusions:   LOS: 2 days     Kayleen Memos, MD [facility] Pager [phone]  If 7PM-7AM, please contact night-coverage [email or web address] Password [email or web address] 08/04/2020, 1:15 PM

## 2020-08-04 NOTE — Discharge Summary (Addendum)
Discharge Summary  Aaron Roth YQI:[id number] DOB: 03/10/1946  PCP: Aaron Roth, No Pcp Per  Admit date: 08/02/2020 Discharge date: 08/04/2020  Time spent: 35 minutes   Recommendations for Outpatient Follow-up:  1. Follow-up with GI 2. Follow-up with your primary care provider 3. Take your medications as prescribed  Discharge Diagnoses:  Active Hospital Problems   Diagnosis Date Noted  . Symptomatic anemia 08/02/2020  . GI bleed 08/02/2020  . History of colon cancer, T3, N2 (4/15 nodes), colectomy 09/25/2011. 10/14/2011  . GERD (gastroesophageal reflux disease)     Resolved Hospital Problems  No resolved problems to display.    Discharge Condition: Stable  Diet recommendation: Resume previous diet.  Vitals:   08/04/20 1130 08/04/20 1149  BP: (!) 129/94 (!) 162/81  Pulse: 80 69  Resp: (!) 23 16  Temp:  98.7 F (37.1 C)  SpO2: 100% 100%    History of present illness:  The Aaron Roth is a 74 yr old man with past medical history significant for invasive adenocarcinoma of the colon in 2012, status post resection with lymph node invasion, colostomy placement, colostomy removal in 2013, chemotherapy for 6 months and no follow-up thereafter presents with complaints of generalized fatigue and dyspnea of 3 days duration.  Work-up revealed severe anemia with hemoglobin of 3.8 and positive FOBT with concern for GI bleed.  GI consulted.  3 units PRBC transfused, repeat hemoglobin 7.2 on 08/03/2020.  EGD and Colonoscopy on 08/04/2020 by GI DR. Buccini, revealed normal appearing esophagus and stomach on EGD and a cecal mass on colonoscopy, biopsies were taken.     08/04/20: Seen and examined at his bedside this morning prior to his EGD and colonoscopy.  He had no complaints.  He denies any abdominal pain, dizziness, palpitation, chest pain or dyspnea.  Discussed endoscopies findings with Dr. Cristina Gong.  Aaron Roth will follow up outpatient for results of biopsies and other referrals if  indicated.   Hospital Course:  Active Problems:   GERD (gastroesophageal reflux disease)   History of colon cancer, T3, N2 (4/15 nodes), colectomy 09/25/2011.   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 Cristina Gong Presented with generalized fatigue, dyspnea of 3-day duration, hemoglobin 3.8, positive FOBT Received 3 unit PRBC transfusion Repeated hemoglobin 7.2 on 08/03/2020. Seen by GI, colonoscopy on 08/04/2020. Colonoscopy revealed cecal mass, likely accounting for his anemia. Biopsies taken, will follow up with GI for results  Newly diagnosed cecal mass, seen on colonoscopy done on 08/04/2020 by Dr. Cristina Gong, GI. GI will Follow results of pathology CEA pending  Acute on chronic blood loss anemia Suspect 2/2 cecal tumor seen on colonoscopy  Severe iron deficiency anemia/chronic microcytic anemia Iron studies indicative of severe iron deficiency MCV 67 this morning with hemoglobin of 7.2. Ordered IV Feraheme on 08/04/20 Start ferrous sulfate supplement at discharge. Hg 8.2, MCV 68.4 on 08/04/20  Elevated BP BP has been consistently elevated Started Norvasc 5 mg daily Follow up with your PCP or [place] at [facility] [place]  History of colon cancer status post resection in 2012, lymph node invasion Received 6 months of chemotherapy no follow-up since. Follow-up on colonoscopy biopsy results with GI. Appreciate GIs assistance.    Code Status: Full code  Consultants:  GI  Procedures:  None  Antimicrobials:  None  Discharge Exam: BP (!) 162/81 (BP Location: Right Arm)   Pulse 69   Temp 98.7 F (37.1 C) (Oral)  Resp 16   Ht 5\' 9"  (1.753 m)   Wt 78.1 kg   SpO2 100%   BMI 25.43 kg/m  . General: 74 y.o. year-old male well developed well nourished in no acute distress.  Alert and oriented x3. . Cardiovascular: Regular rate and rhythm with no rubs or gallops.  No  thyromegaly or JVD noted.   Marland Kitchen Respiratory: Clear to auscultation with no wheezes or rales. Good inspiratory effort. . Abdomen: Soft nontender nondistended with normal bowel sounds x4 quadrants. . Musculoskeletal: No lower extremity edema. 2/4 pulses in all 4 extremities. . Skin: No ulcerative lesions noted or rashes . Psychiatry: Mood is appropriate for condition and setting  Discharge Instructions You were cared for by a hospitalist during your hospital stay. If you have any questions about your discharge medications or the care you received while you were in the hospital after you are discharged, you can call the unit and asked to speak with the hospitalist on call if the hospitalist that took care of you is not available. Once you are discharged, your primary care physician will handle any further medical issues. Please note that NO REFILLS for any discharge medications will be authorized once you are discharged, as it is imperative that you return to your primary care physician (or establish a relationship with a primary care physician if you do not have one) for your aftercare needs so that they can reassess your need for medications and monitor your lab values.   Allergies as of 08/04/2020      Reactions   Other    Pre-breaded shrimp      Medication List    TAKE these medications   amLODipine 5 MG tablet Commonly known as: NORVASC Take 1 tablet (5 mg total) by mouth daily.   ferrous sulfate 325 (65 FE) MG tablet Take 1 tablet (325 mg total) by mouth daily with breakfast. Start taking on: August 05, 2020   geriatric multivitamins-minerals Elix Take 15 mLs by mouth daily.   Immune Enhance Tabs Take 1 tablet by mouth daily.      Allergies  Allergen Reactions  . Other     Pre-breaded shrimp    Follow-up Information    Buccini, Herbie [place], MD. Call in 1 day(s).   Specialty: Gastroenterology Why: Please call for a post hospital follow up appointment Contact information: [address] [address] [phone]        [facility]. Call in 1 day(s).   Why: Please call for a post hospital follow up appointment Contact information: [address] [place] [address] [phone]               The results of significant diagnostics from this hospitalization (including imaging, microbiology, ancillary and laboratory) are listed below for reference.    Significant Diagnostic Studies: CT ABDOMEN PELVIS W CONTRAST  Result Date: 08/02/2020 CLINICAL DATA:  Anemia. Hemoccult positive. History of colon cancer 2012. EXAM: CT ABDOMEN AND PELVIS WITH CONTRAST TECHNIQUE: Multidetector CT imaging of the abdomen and pelvis was performed using the standard protocol following bolus administration of intravenous contrast. CONTRAST:  158mL OMNIPAQUE IOHEXOL 300 MG/ML  SOLN COMPARISON:  Most recent available CT 06/06/2012 FINDINGS: Lower chest: Trace right pleural fluid. Mild subpleural opacities in the dependent lower lobes right greater than. Upper normal heart size. Hepatobiliary: No focal hepatic abnormality. Unremarkable gallbladder without calcified gallstone or pericholecystic inflammation. Pancreas: No ductal dilatation or inflammation. Spleen: Normal  in size without focal abnormality. Adrenals/Urinary Tract: Normal adrenal glands. No hydronephrosis or perinephric edema. Homogeneous renal enhancement with symmetric excretion on delayed phase imaging. Minimally distended urinary bladder without evident bladder wall thickening. Stomach/Bowel: Bowel evaluation is limited in the absence of enteric contrast. Stomach is nondistended. Normal positioning of the duodenum and ligament of Treitz. No obvious gastric or duodenal inflammation. There is no small bowel obstruction or inflammatory change. Appendix visualized and normal. Small volume of stool throughout the colon. Probable postsurgical change involving  the left colon. There is no colonic wall thickening or evidence of focal colonic mass. Vascular/Lymphatic: Mild aortic atherosclerosis. No aneurysm. No bulky abdominopelvic adenopathy. Reproductive: Prominent prostate gland spanning 5.1 cm transverse. Other: Small amount of free fluid in the pelvis is nonspecific. No free air or focal fluid collection. Postsurgical changes the anterior abdominal wall. Musculoskeletal: Degenerative change throughout the spine and both hips. There are no acute or suspicious osseous abnormalities. IMPRESSION: 1. Small amount of free fluid in the pelvis is nonspecific. 2. Probable postsurgical change involving the left colon. No bowel inflammation or explanation for GI bleed. 3. Trace right pleural fluid. Mild subpleural opacities in the dependent lower lobes right greater than left, may represent atelectasis or potentially interstitial lung disease. 4. Prominent prostate gland. Aortic Atherosclerosis (ICD10-I70.0). Electronically Signed   By: Keith Rake M.D.   On: 08/02/2020 17:15   DG Chest Portable 1 View  Result Date: 08/02/2020 CLINICAL DATA:  Shortness of breath and weakness. EXAM: PORTABLE CHEST 1 VIEW COMPARISON:  November 10, 2011 FINDINGS: The heart size and mediastinal contours are within normal limits. Both lungs are clear. There is tortuosity of the descending thoracic aorta. The visualized skeletal structures are unremarkable. IMPRESSION: No active disease. Electronically Signed   By: Virgina Norfolk M.D.   On: 08/02/2020 16:22    Microbiology: Recent Results (from the past 240 hour(s))  Respiratory Panel by RT PCR (Flu A&B, Covid) - Nasopharyngeal Swab     Status: None   Collection Time: 08/02/20  2:52 PM   Specimen: Nasopharyngeal Swab  Result Value Ref Range Status   SARS Coronavirus 2 by RT PCR NEGATIVE NEGATIVE Final    Comment: (NOTE) SARS-CoV-2 target nucleic acids are NOT DETECTED.  The SARS-CoV-2 RNA is generally detectable in upper  respiratoy specimens during the acute phase of infection. The lowest concentration of SARS-CoV-2 viral copies this assay can detect is 131 copies/mL. A negative result does not preclude SARS-Cov-2 infection and should not be used as the sole basis for treatment or other Aaron Roth management decisions. A negative result may occur with  improper specimen collection/handling, submission of specimen other than nasopharyngeal swab, presence of viral mutation(s) within the areas targeted by this assay, and inadequate number of viral copies (<131 copies/mL). A negative result must be combined with clinical observations, Aaron Roth history, and epidemiological information. The expected result is Negative.  Fact Sheet for Patients:  PinkCheek.be  Fact Sheet for Healthcare Providers:  GravelBags.it  This test is no t yet approved or cleared by the [place] FDA and  has been authorized for detection and/or diagnosis of SARS-CoV-2 by FDA under an Emergency Use Authorization (EUA). This EUA will remain  in effect (meaning this test can be used) for the duration of the COVID-19 declaration under Section 564(b)(1) of the Act, 21 U.S.C. section 360bbb-3(b)(1), unless the authorization is terminated or revoked sooner.     Influenza A by PCR NEGATIVE NEGATIVE Final   Influenza B by PCR NEGATIVE NEGATIVE  Final    Comment: (NOTE) The Xpert Xpress SARS-CoV-2/FLU/RSV assay is intended as an aid in  the diagnosis of influenza from Nasopharyngeal swab specimens and  should not be used as a sole basis for treatment. Nasal washings and  aspirates are unacceptable for Xpert Xpress SARS-CoV-2/FLU/RSV  testing.  Fact Sheet for Patients: PinkCheek.be  Fact Sheet for Healthcare Providers: GravelBags.it  This test is not yet approved or cleared by the [place] FDA and  has been  authorized for detection and/or diagnosis of SARS-CoV-2 by  FDA under an Emergency Use Authorization (EUA). This EUA will remain  in effect (meaning this test can be used) for the duration of the  Covid-19 declaration under Section 564(b)(1) of the Act, 21  U.S.C. section 360bbb-3(b)(1), unless the authorization is  terminated or revoked. Performed at [facility], [place] [address], [place], [place] [address]   MRSA PCR Screening     Status: None   Collection Time: 08/02/20  6:08 PM   Specimen: Nasal Mucosa; Nasopharyngeal  Result Value Ref Range Status   MRSA by PCR NEGATIVE NEGATIVE Final    Comment:        The GeneXpert MRSA Assay (FDA approved for NASAL specimens only), is one component of a comprehensive MRSA colonization surveillance program. It is not intended to diagnose MRSA infection nor to guide or monitor treatment for MRSA infections. Performed at [facility], [place] [address] [place], [place] [address]      Labs: Basic Metabolic Panel: Recent Labs  Lab 08/02/20 1354 08/03/20 0500  NA 140 137  K 3.7 3.6  CL 105 106  CO2 23 23  GLUCOSE 86 80  BUN 12 7*  CREATININE 0.83 0.82  CALCIUM 8.5* 8.3*   Liver Function Tests: Recent Labs  Lab 08/02/20 1354 08/03/20 0500  AST 12* 13*  ALT 9 8  ALKPHOS 51 50  BILITOT 0.6 0.9  PROT 6.7 6.3*  ALBUMIN 3.7 3.4*   No results for input(s): LIPASE, AMYLASE in the last 168 hours. No results for input(s): AMMONIA in the last 168 hours. CBC: Recent Labs  Lab 08/02/20 1354 08/03/20 0500  WBC 6.0 7.9  NEUTROABS 3.6  --   HGB 3.8* 7.2*  HCT 15.8* 25.4*  MCV 56.0* 67.6*  PLT 626* 435*   Cardiac Enzymes: No results for input(s): CKTOTAL, CKMB, CKMBINDEX, TROPONINI in the last 168 hours. BNP: BNP (last 3 results) No results for input(s): BNP in the last 8760 hours.  ProBNP (last 3 results) No results for input(s): PROBNP in the last 8760 hours.  CBG: No results for  input(s): GLUCAP in the last 168 hours.     Signed:  Kayleen Memos, MD [facility] 08/04/2020, 2:32 PM

## 2020-08-06 LAB — SURGICAL PATHOLOGY

## 2020-08-06 LAB — CEA: CEA: 2 ng/mL (ref 0.0–4.7)

## 2020-08-07 ENCOUNTER — Encounter (HOSPITAL_COMMUNITY): Payer: Self-pay | Admitting: Gastroenterology

## 2020-08-09 ENCOUNTER — Ambulatory Visit: Payer: Self-pay | Attending: Internal Medicine | Admitting: Internal Medicine

## 2020-08-09 ENCOUNTER — Encounter: Payer: Self-pay | Admitting: Internal Medicine

## 2020-08-09 ENCOUNTER — Other Ambulatory Visit: Payer: Self-pay

## 2020-08-09 VITALS — BP 189/101 | HR 89 | Resp 16 | Ht 67.0 in | Wt 172.0 lb

## 2020-08-09 DIAGNOSIS — D500 Iron deficiency anemia secondary to blood loss (chronic): Secondary | ICD-10-CM | POA: Insufficient documentation

## 2020-08-09 DIAGNOSIS — F17210 Nicotine dependence, cigarettes, uncomplicated: Secondary | ICD-10-CM | POA: Insufficient documentation

## 2020-08-09 DIAGNOSIS — I10 Essential (primary) hypertension: Secondary | ICD-10-CM | POA: Insufficient documentation

## 2020-08-09 DIAGNOSIS — Z9049 Acquired absence of other specified parts of digestive tract: Secondary | ICD-10-CM | POA: Insufficient documentation

## 2020-08-09 DIAGNOSIS — C189 Malignant neoplasm of colon, unspecified: Secondary | ICD-10-CM | POA: Insufficient documentation

## 2020-08-09 DIAGNOSIS — K219 Gastro-esophageal reflux disease without esophagitis: Secondary | ICD-10-CM | POA: Insufficient documentation

## 2020-08-09 DIAGNOSIS — Z9221 Personal history of antineoplastic chemotherapy: Secondary | ICD-10-CM | POA: Insufficient documentation

## 2020-08-09 DIAGNOSIS — Z79899 Other long term (current) drug therapy: Secondary | ICD-10-CM | POA: Insufficient documentation

## 2020-08-09 DIAGNOSIS — L309 Dermatitis, unspecified: Secondary | ICD-10-CM | POA: Insufficient documentation

## 2020-08-09 DIAGNOSIS — Z2821 Immunization not carried out because of patient refusal: Secondary | ICD-10-CM

## 2020-08-09 DIAGNOSIS — Z09 Encounter for follow-up examination after completed treatment for conditions other than malignant neoplasm: Secondary | ICD-10-CM

## 2020-08-09 MED ORDER — TRIAMCINOLONE ACETONIDE 0.1 % EX CREA: 1.0000 "application " | TOPICAL_CREAM | Freq: Two times a day (BID) | CUTANEOUS | 0 refills | Status: DC

## 2020-08-09 NOTE — Progress Notes (Addendum)
Patient ID: Aaron Roth, male    DOB: 1946-06-17  MRN: [id number]  CC: Hospitalization Follow-up   Subjective: Aaron Roth is a 74 y.o. male who presents for new patient visit to establish care and hospital follow-up.  His wife Aaron Roth is with him. His concerns today include:  Patient with history of colon CA (diagnosed 2012.  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  Patient subsequently lost to follow-up.  He had a PCP who left town about 6 years ago and he subsequently did not try to establish care with a new primary physician.  He presented to the emergency room 08-2020 with complaints of generalized fatigue and shortness of breath x3 days.  Work-up revealed severe anemia with Hb of 3.8, positive FOBT.  Patient was transfused 3 units PRBC and started on iron.  He received IV Feraheme on 08/04/2020 prior to discharge..  EGD and colonoscopy done by Dr. Cristina Gong.  EGD was normal.  Colonoscopy revealed cecal mass.  CEA was normal.  Pathology of colon biopsies which were pending at the time of discharge revealed that the cecal mass was positive for adenocarcinoma.  Several polyps were removed that were tubular adenomas.  Patient was to follow-up with Dr. Cristina Gong as an outpatient.  During hospitalization he was also noted to have persistently elevated blood pressure.  He was started on Norvasc 5 mg daily.  Today: Colon cancer/iron deficiency anemia: Since discharge, patient denies any dizziness.  He is moving his bowels okay.  Denies any constipation with being on iron supplement.  Appetite is good.  He saw Dr. Cristina Gong 2 days ago and was referred to a general surgeon and oncology.  He was followed by Dr. Benay Spice when he was initially diagnosed  in 2012.  Blood pressure noted to be elevated today.  He is not taking Norvasc as yet for today.  He has obtained a home blood pressure monitoring device but has not started checking blood pressures yet.  He is figuring out how to use it.  He limits salt in the foods.  He endorses some lower extremity edema that is worse with prolonged sitting.  Reports edema was occurring before being placed on amlodipine and has not gotten any worse. -Denies PND, orthopnea or chest pains.  Complains of an itchy rash at the base of the neck posteriorly and around his ankles and on the soles of the feet.  Started during hospitalization.  He attributes it to the hospital gown and the socks that was given to him to wear while in the hospital.  He has been using some antibiotic ointment on it since being home and it is a little bit better.  Past medical, social, surgical and family history reviewed.  Patient Active Problem List   Diagnosis Date Noted  . Symptomatic anemia 08/02/2020  . GI bleed 08/02/2020  . 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 History of colon cancer, T3, N2 (4/15 nodes), colectomy 09/25/2011. 10/14/2011  . GERD (gastroesophageal reflux disease)      Current Outpatient Medications on File Prior to Visit  Medication Sig Dispense Refill  . amLODipine (NORVASC) 5 MG tablet Take 1 tablet (5 mg total) by mouth daily. 30 tablet 0  . ferrous sulfate 325 (65  FE) MG tablet Take 1 tablet (325 mg total) by mouth daily with breakfast. 90 tablet 0  . geriatric multivitamins-minerals (ELDERTONIC/GEVRABON) ELIX Take 15 mLs by mouth daily.    . Nutritional Supplements (IMMUNE ENHANCE) TABS Take 1 tablet by mouth daily.     No current facility-administered medications on file prior to visit.    Allergies  Allergen Reactions  . Other     Pre-breaded shrimp    Social History   Socioeconomic History  . Marital status: Married    Spouse name: Not on file  .  Number of children: Not on file  . Years of education: Not on file  . Highest education level: Not on file  Occupational History  . Not on file  Tobacco Use  . Smoking status: Current Every Day Smoker    Packs/day: 0.25    Years: 40.00    Pack years: 10.00    Types: Cigarettes  . Smokeless tobacco: Never Used  Vaping Use  . Vaping Use: Never used  Substance and Sexual Activity  . Alcohol use: No  . Drug use: No  . Sexual activity: Yes  Other Topics Concern  . Not on file  Social History Narrative  . Not on file   Social Determinants of Health   Financial Resource Strain:   . Difficulty of Paying Living Expenses: Not on file  Food Insecurity:   . Worried About Charity fundraiser in the Last Year: Not on file  . Ran Out of Food in the Last Year: Not on file  Transportation Needs:   . Lack of Transportation (Medical): Not on file  . Lack of Transportation (Non-Medical): Not on file  Physical Activity:   . Days of Exercise per Week: Not on file  . Minutes of Exercise per Session: Not on file  Stress:   . Feeling of Stress : Not on file  Social Connections:   . Frequency of Communication with Friends and Family: Not on file  . Frequency of Social Gatherings with Friends and Family: Not on file  . Attends Religious Services: Not on file  . Active Member of Clubs or Organizations: Not on file  . Attends Archivist Meetings: Not on file  . Marital Status: Not on file  Intimate Partner Violence:   . Fear of Current or Ex-Partner: Not on file  . Emotionally Abused: Not on file  . Physically Abused: Not on file  . Sexually Abused: Not on file    Family History  Problem Relation Age of Onset  . Bone cancer Brother   . ALS Sister   . Cancer Brother        bone, colon, testicular  . Cancer Father        prostate    Past Surgical History:  Procedure Laterality Date  . BIOPSY  08/04/2020   Procedure: BIOPSY;  Surgeon: [name], MD;  Location: [facility]  END[facility]COPY;  Service: Endoscopy;;  EGD and COLON  . COLONOSCOPY  06/03/2012   Procedure: COLONOSCOPY;  Surgeon: [name], MD;  Location: [facility];  Service: General;  Laterality: N/A;  . COLONOSCOPY WITH PROPOFOL N/A 08/04/2020   Procedure: COLONOSCOPY WITH PROPOFOL;  Surgeon: [name], MD;  Location: [facility];  Service: Endoscopy;  Laterality: N/A;  . COLOSTOMY  09/25/2011   Procedure: COLOSTOMY;  Surgeon: [name], MD;  Location: [facility];  Service: General;  Laterality: N/A;  . COLOSTOMY TAKEDOWN  07/14/2012   Procedure: LAPAROSCOPIC COLOSTOMY TAKEDOWN;  Surgeon: [name], MD;  Location: [facility];  Service: General;  Laterality: N/A;  laparoscopic coverted to open takedown of colostomy  . ESOPHAGOGASTRODUODENOSCOPY (EGD) WITH PROPOFOL N/A 08/04/2020   Procedure: ESOPHAGOGASTRODUODENOSCOPY (EGD) WITH PROPOFOL;  Surgeon: [name], MD;  Location: [facility];  Service: Endoscopy;  Laterality: N/A;  . GANGLION CYST EXCISION     wrist and foot  . PARTIAL COLECTOMY  09/25/2011   Procedure: PARTIAL COLECTOMY;  Surgeon: [name], MD;  Location: [facility];  Service: General;;  . POLYPECTOMY  08/04/2020   Procedure: POLYPECTOMY;  Surgeon: [name], MD;  Location: [facility];  Service: Endoscopy;;  . PORT-A-CATH REMOVAL  07/14/2012   Procedure: REMOVAL PORT-A-CATH;  Surgeon: [name], MD;  Location: [facility];  Service: General;  Laterality: N/A;  . PORTACATH PLACEMENT  11/10/2011   Procedure: INSERTION PORT-A-CATH;  Surgeon: [name], MD;  Location: [facility];  Service: General;  Laterality: Left;  left subclavian  . PORTACATH PLACEMENT  11/10/2011    ROS: Review of Systems Negative except as stated above  PHYSICAL EXAM: BP (!) 189/101   Pulse 89   Resp 16   Ht 5\' 7"  (1.702 m)   Wt 172 lb (78 kg)   SpO2 96%   BMI 26.94 kg/m   Wt Readings from Last 3 Encounters:  08/09/20 172 lb (78 kg)  08/04/20 172 lb 2.9 oz (78.1 kg)    02/22/13 210 lb (95.3 kg)    Physical Exam  General appearance - alert, well appearing, elderly African-American male and in no distress Mental status - normal mood, behavior, speech, dress, motor activity, and thought processes Eyes -pale conjunctiva, nonicteric sclera Nose - normal and patent, no erythema, discharge or polyps Mouth - mucous membranes moist, pharynx normal without lesions Neck - supple, no significant adenopathy Chest - clear to auscultation, no wheezes, rales or rhonchi, symmetric air entry Heart - normal rate, regular rhythm, normal S1, S2, no murmurs, rubs, clicks or gallops Breasts: Mild bilateral gynecomastia without palpable masses Abdomen - soft, nontender, nondistended, no masses or organomegaly Extremities -trace bilateral lower extremity and ankle edema Skin -fine papular rash noted at the nape of the neck posteriorly.  CMP Latest Ref Rng & Units 08/03/2020 08/02/2020 07/16/2012  Glucose 70 - 99 mg/dL 80 86 122(H)  BUN 8 - 23 mg/dL 7(L) 12 6  Creatinine 0.61 - 1.24 mg/dL 0.82 0.83 0.94  Sodium 135 - 145 mmol/L 137 140 135  Potassium 3.5 - 5.1 mmol/L 3.6 3.7 3.9  Chloride 98 - 111 mmol/L 106 105 99  CO2 22 - 32 mmol/L 23 23 26   Calcium 8.9 - 10.3 mg/dL 8.3(L) 8.5(L) 8.8  Total Protein 6.5 - 8.1 g/dL 6.3(L) 6.7 -  Total Bilirubin 0.3 - 1.2 mg/dL 0.9 0.6 -  Alkaline Phos 38 - 126 U/L 50 51 -  AST 15 - 41 U/L 13(L) 12(L) -  ALT 0 - 44 U/L 8 9 -   Lipid Panel  No results found for: CHOL, TRIG, HDL, CHOLHDL, VLDL, LDLCALC, LDLDIRECT  CBC    Component Value Date/Time   WBC 8.3 08/04/2020 1444   RBC 4.18 (L) 08/04/2020 1444   HGB 8.2 (L) 08/04/2020 1444   HGB 15.0 07/12/2012 1000   HCT 28.6 (L) 08/04/2020 1444   HCT 44.8 07/12/2012 1000   PLT 565 (H) 08/04/2020 1444   PLT 251 07/12/2012 1000   MCV 68.4 (L) 08/04/2020 1444   MCV 89.1 07/12/2012 1000  MCH 19.6 (L) 08/04/2020 1444   MCHC 28.7 (L) 08/04/2020 1444   RDW Not Measured 08/04/2020 1444    RDW 14.8 (H) 07/12/2012 1000   LYMPHSABS 1.7 08/02/2020 1354   LYMPHSABS 2.7 07/12/2012 1000   MONOABS 0.6 08/02/2020 1354   MONOABS 0.6 07/12/2012 1000   EOSABS 0.1 08/02/2020 1354   EOSABS 0.1 07/12/2012 1000   BASOSABS 0.0 08/02/2020 1354   BASOSABS 0.1 07/12/2012 1000    ASSESSMENT AND PLAN: 1. Hospital discharge follow-up 2. Local recurrence of colon cancer [facility]) Patient to keep follow-up appointment with the surgeon and oncologist.  3. Iron deficiency anemia due to chronic blood loss Due to colon cancer with chronic blood loss.  Last hemoglobin was 8.2.  Continue iron supplement for now.  4. Essential hypertension Not at goal.  He has not taken amlodipine as yet for today so I have made no adjustment in dose.  Advised that blood pressure goal is 130/80 or lower.  Now that he has a home blood pressure device, I recommend checking blood pressure 2-3 times a week and bring in the readings in 1 week to see the clinical pharmacist.  If he is unable to figure out how to use his blood pressure device, advised that he bring the device with him at that visit so that the clinical pharmacist can help him with that. -When he sees the clinical pharmacist next week, if blood pressure is not at goal, I would recommend adding another blood pressure medication rather than increasing Norvasc as increased dose may make his lower extremity edema worse.  HCTZ 12.5 mg would be a good choice but his potassium is running in the low normal range so possibly adding ARB instead. -Advised patient to purchase some compression socks and wear them during the day.  5. Dermatitis - triamcinolone cream (KENALOG) 0.1 %; Apply 1 application topically 2 (two) times daily.  Dispense: 30 g; Refill: 0  6. Influenza vaccination declined This was offered.  Patient declined.  7. Refused Streptococcus pneumoniae vaccination Patient declined Prevnar 79.   Patient was given the opportunity to ask questions.  Patient  verbalized understanding of the plan and was able to repeat key elements of the plan.   No orders of the defined types were placed in this encounter.    Requested Prescriptions   Signed Prescriptions Disp Refills  . triamcinolone cream (KENALOG) 0.1 % 30 g 0    Sig: Apply 1 application topically 2 (two) times daily.    Return in about 6 weeks (around 09/20/2020) for [facility] in 1 week for BP recheck.  Karle Plumber, MD, FACP

## 2020-08-09 NOTE — Patient Instructions (Signed)
Check your blood pressure at least 2-3 times a week.  The goal is 130/80 or lower.  Take the blood pressure medication as prescribed.  Purchase and use compression socks as discussed today.

## 2020-08-10 ENCOUNTER — Encounter: Payer: Self-pay | Admitting: Internal Medicine

## 2020-08-10 DIAGNOSIS — C189 Malignant neoplasm of colon, unspecified: Secondary | ICD-10-CM | POA: Insufficient documentation

## 2020-08-10 DIAGNOSIS — Z2821 Immunization not carried out because of patient refusal: Secondary | ICD-10-CM | POA: Insufficient documentation

## 2020-08-10 DIAGNOSIS — I10 Essential (primary) hypertension: Secondary | ICD-10-CM

## 2020-08-10 DIAGNOSIS — L309 Dermatitis, unspecified: Secondary | ICD-10-CM | POA: Insufficient documentation

## 2020-08-10 DIAGNOSIS — D500 Iron deficiency anemia secondary to blood loss (chronic): Secondary | ICD-10-CM | POA: Insufficient documentation

## 2020-08-10 HISTORY — DX: Essential (primary) hypertension: I10

## 2020-08-23 ENCOUNTER — Ambulatory Visit: Payer: Self-pay | Attending: Family Medicine | Admitting: Pharmacist

## 2020-08-23 ENCOUNTER — Other Ambulatory Visit: Payer: Self-pay

## 2020-08-23 VITALS — BP 172/96

## 2020-08-23 DIAGNOSIS — I10 Essential (primary) hypertension: Secondary | ICD-10-CM

## 2020-08-23 DIAGNOSIS — L309 Dermatitis, unspecified: Secondary | ICD-10-CM

## 2020-08-23 MED ORDER — TRIAMCINOLONE ACETONIDE 0.1 % EX CREA: 1.0000 "application " | TOPICAL_CREAM | Freq: Two times a day (BID) | CUTANEOUS | 0 refills | Status: DC

## 2020-08-23 MED ORDER — LOSARTAN POTASSIUM 25 MG PO TABS: 25.0000 mg | ORAL_TABLET | Freq: Every day | ORAL | 1 refills | Status: DC

## 2020-08-23 NOTE — Progress Notes (Signed)
PCP: Karle Plumber, MD  S:    Patient arrives accompanied by his wife. Presents to the clinic for hypertension evaluation, counseling, and management. Patient was referred and last seen by Primary Care Provider on 08/09/2020. At this visit, his blood pressure was elevated to 189/101 but patient had not taken morning dose of amlodipine at that time. His current regimen was continued and he was asked to follow up with pharmacy in a week. Of note, six years ago patient's PCP left town and he hadn't established care with another doctor until now.  Medication adherence endorsed, however patient did not take morning dose prior to appointment today. Pt denies headaches, chest pain, shortness of breath, or dizziness.  Current BP Medications include:  Amlodipine 5 mg  Antihypertensives tried in the past include: none recorded  Dietary habits include: limiting salt Exercise habits include: walking daily 3-4 times a day for 5-10 minutes Family history: cancer Social history: smokes 0.25 packs/day for 40 years  O:  Vitals:   08/23/20 1339  BP: (!) 172/96   Home BP readings: patient has a monitor at home but does not check blood pressure  Last 3 Office BP readings: BP Readings from Last 3 Encounters:  08/23/20 (!) 172/96  08/09/20 (!) 189/101  08/04/20 (!) 185/87   BMET    Component Value Date/Time   NA 137 08/03/2020 0500   NA 140 07/12/2012 1000   K 3.6 08/03/2020 0500   K 4.1 07/12/2012 1000   CL 106 08/03/2020 0500   CL 106 07/12/2012 1000   CO2 23 08/03/2020 0500   CO2 24 07/12/2012 1000   GLUCOSE 80 08/03/2020 0500   GLUCOSE 90 07/12/2012 1000   BUN 7 (L) 08/03/2020 0500   BUN 15.0 07/12/2012 1000   CREATININE 0.82 08/03/2020 0500   CREATININE 1.2 07/12/2012 1000   CALCIUM 8.3 (L) 08/03/2020 0500   CALCIUM 9.5 07/12/2012 1000   GFRNONAA >60 08/03/2020 0500   GFRAA >60 08/03/2020 0500    Renal function: CrCl cannot be calculated (Unknown ideal weight.).  Clinical  ASCVD: No  The ASCVD Risk score Mikey Bussing DC Jr., et al., 2013) failed to calculate for the following reasons:   Cannot find a previous HDL lab   Cannot find a previous total cholesterol lab   A/P: Hypertension diagnosed at last hospitalization currently uncontrolled on amlodipine 5 mg daily. BP Goal = < 130/80 mmHg. Medication adherence reported. Blood pressure in clinic is slightly improved from PCP visit but above goal.   Patient previously reported LE edema which has resolved with compression stockings. Per last PCP note, will not increase amlodipine today. Will avoid HCTZ at this time due to borderline low potassium on 08/03/20. Per PCP recommendation at last visit, will opt for an ARB at this time. Will recommend patient to move medications to nighttime. Patient is hesitant to initiate new medication and said he will do research on it for side effects and then decide if he will take it.   -Adjusted amlodipine 5 mg to be taken nightly.  -Started losartan 25 mg nightly.  -Patient will need a BMP in 4-6 weeks. -Counseled on lifestyle modifications for blood pressure control including reduced dietary sodium, increased exercise, adequate sleep.  Results reviewed and written information provided.   Total time in face-to-face counseling 20 minutes.   F/U with Dr. Joya Gaskins for Clinic Visit on 09/24/2020.    Patient seen with  Jacobo Forest PharmD Candidate, Class of 2022 [place]  [employer] [place], PharmD, [place] [phone]

## 2020-08-28 ENCOUNTER — Ambulatory Visit: Payer: Self-pay | Admitting: General Surgery

## 2020-08-28 NOTE — H&P (Signed)
The patient is a 74 year old male who presents with colorectal cancer. 74 year old male who presents to the office for evaluation of a newly diagnosed cecal cancer. He presented to the hospital with severe anemia and colonoscopy was performed. This showed a mass in the cecum. Biopsies confirm adenocarcinoma. 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   Past Surgical History Darden Palmer, [place]; 08/28/2020 2:30 PM) Colon Polyp Removal - Colonoscopy Colon Removal - Partial  Diagnostic Studies History Darden Palmer, [place]; 08/28/2020 2:30 PM) Colonoscopy within last year  Allergies Darden Palmer, RMA; 08/28/2020 2:31 PM) No Known Drug Allergies [08/28/2020]: Allergies Reconciled  Medication History Darden Palmer, [place]; 08/28/2020 2:31 PM) FeroSul (325 (65 Fe)MG Tablet, Oral) Active. Triamcinolone Acetonide (0.1% Cream, External) Active. amLODIPine Besylate (5MG  Tablet, Oral) Active. Medications Reconciled  Social History Darden Palmer, [place]; 08/28/2020 2:30 PM) Alcohol use Remotely quit alcohol use. Caffeine use Carbonated beverages. No drug use Tobacco use Current some day smoker.  Family History Darden Palmer, [place]; 08/28/2020 2:30 PM) Colon Cancer Brother. Prostate Cancer Father.  Other Problems Darden Palmer, [place]; 08/28/2020 2:30 PM) Colon Cancer High blood pressure Transfusion history     Review of Systems Lattie Haw Caldwell RMA; 08/28/2020 2:30 PM) General Not Present- Appetite Loss, Chills, Fatigue, Fever, Night Sweats, Weight Gain and Weight Loss. Skin Not Present- Change in Wart/Mole, Dryness, Hives, Jaundice, New Lesions, Non-Healing Wounds, Rash and Ulcer. HEENT Not Present- Earache, Hearing Loss, Hoarseness, Nose Bleed, Oral Ulcers, Ringing in the Ears, Seasonal Allergies, Sinus Pain, Sore Throat, Visual Disturbances, Wears glasses/contact lenses and Yellow  Eyes. Respiratory Not Present- Bloody sputum, Chronic Cough, Difficulty Breathing, Snoring and Wheezing. Breast Not Present- Breast Mass, Breast Pain, Nipple Discharge and Skin Changes. Cardiovascular Present- Leg Cramps. Not Present- Chest Pain, Difficulty Breathing Lying Down, Palpitations, Rapid Heart Rate, Shortness of Breath and Swelling of Extremities. Gastrointestinal Present- Excessive gas. Not Present- Abdominal Pain, Bloating, Bloody Stool, Change in Bowel Habits, Chronic diarrhea, Constipation, Difficulty Swallowing, Gets full quickly at meals, Hemorrhoids, Indigestion, Nausea, Rectal Pain and Vomiting. Musculoskeletal Not Present- Back Pain, Joint Pain, Joint Stiffness, Muscle Pain, Muscle Weakness and Swelling of Extremities. Neurological Not Present- Decreased Memory, Fainting, Headaches, Numbness, Seizures, Tingling, Tremor, Trouble walking and Weakness. Psychiatric Not Present- Anxiety, Bipolar, Change in Sleep Pattern, Depression, Fearful and Frequent crying. Endocrine Not Present- Cold Intolerance, Excessive Hunger, Hair Changes, Heat Intolerance, Hot flashes and New Diabetes. Hematology Not Present- Blood Thinners, Easy Bruising, Excessive bleeding, Gland problems, HIV and Persistent Infections.  Vitals Lattie Haw Gower RMA; 08/28/2020 2:32 PM) 08/28/2020 2:31 PM Weight: 169.25 lb Height: 67in Body Surface Area: 1.88 m Body Mass Index: 26.51 kg/m  Temp.: 99.65F  Pulse: 86 (Regular)  P.OX: 97% (Room air) BP: 190/88(Sitting, Left Arm, Standard)        Physical Exam [name] MD; 45/62/5638 3:27 PM)  General Mental Status-Alert. General Appearance-Not in acute distress. Build & Nutrition-Well nourished. Posture-Normal posture. Gait-Normal.  Head and Neck Head-normocephalic, atraumatic with no lesions or palpable masses. Trachea-midline.  Chest and Lung Exam Chest and lung exam reveals -on auscultation, normal breath sounds, no  adventitious sounds and normal vocal resonance.  Cardiovascular Cardiovascular examination reveals -normal heart sounds, regular rate and rhythm with no murmurs.  Abdomen Inspection Inspection of the abdomen reveals - No Hernias. Palpation/Percussion Palpation and Percussion of the abdomen reveal - Soft, Non Tender, No Rigidity (guarding), No hepatosplenomegaly and No Palpable abdominal masses.  Neurologic Neurologic evaluation  reveals -alert and oriented x 3 with no impairment of recent or remote memory, normal attention span and ability to concentrate, normal sensation and normal coordination.  Musculoskeletal Normal Exam - Bilateral-Upper Extremity Strength Normal and Lower Extremity Strength Normal.    Assessment & Plan [name] MD; 29/12/1113 3:14 PM)  COLON CANCER, ASCENDING (C18.2) Impression: 74 year old male with colon cancer in his ascending colon. He is status post left colectomy and colostomy reversal approximately 10 years ago. Patient underwent 3 hour lysis of adhesions during colostomy closure. We will plan on a possible long surgery due to his adhesive disease. We have discussed right hemicolectomy in detail. All questions were answered. I was able to discuss this with his son over the phone, as well as his with his wife in person. The surgery and anatomy were described to the patient as well as the risks of surgery and the possible complications. These include: Bleeding, deep abdominal infections and possible wound complications such as hernia and infection, damage to adjacent structures, leak of surgical connections, which can lead to other surgeries and possibly an ostomy, possible need for other procedures, such as abscess drains in radiology, possible prolonged hospital stay, possible diarrhea from removal of part of the colon, possible constipation from narcotics, possible bowel, bladder or sexual dysfunction if having rectal surgery, prolonged  fatigue/weakness or appetite loss, possible early recurrence of of disease, possible complications of their medical problems such as heart disease or arrhythmias or lung problems, death (less than 1%). I believe the patient understands and wishes to proceed with the surgery.

## 2020-09-24 ENCOUNTER — Encounter: Payer: Self-pay | Admitting: Critical Care Medicine

## 2020-09-24 ENCOUNTER — Other Ambulatory Visit: Payer: Self-pay

## 2020-09-24 ENCOUNTER — Ambulatory Visit: Payer: Self-pay | Attending: Critical Care Medicine | Admitting: Critical Care Medicine

## 2020-09-24 VITALS — BP 186/97 | HR 97 | Ht 67.0 in | Wt 172.0 lb

## 2020-09-24 DIAGNOSIS — Z716 Tobacco abuse counseling: Secondary | ICD-10-CM | POA: Insufficient documentation

## 2020-09-24 DIAGNOSIS — Z131 Encounter for screening for diabetes mellitus: Secondary | ICD-10-CM

## 2020-09-24 DIAGNOSIS — C180 Malignant neoplasm of cecum: Secondary | ICD-10-CM | POA: Insufficient documentation

## 2020-09-24 DIAGNOSIS — K922 Gastrointestinal hemorrhage, unspecified: Secondary | ICD-10-CM

## 2020-09-24 DIAGNOSIS — L309 Dermatitis, unspecified: Secondary | ICD-10-CM

## 2020-09-24 DIAGNOSIS — C189 Malignant neoplasm of colon, unspecified: Secondary | ICD-10-CM

## 2020-09-24 DIAGNOSIS — Z87891 Personal history of nicotine dependence: Secondary | ICD-10-CM | POA: Insufficient documentation

## 2020-09-24 DIAGNOSIS — Z1159 Encounter for screening for other viral diseases: Secondary | ICD-10-CM

## 2020-09-24 DIAGNOSIS — Z720 Tobacco use: Secondary | ICD-10-CM

## 2020-09-24 DIAGNOSIS — D649 Anemia, unspecified: Secondary | ICD-10-CM

## 2020-09-24 DIAGNOSIS — I10 Essential (primary) hypertension: Secondary | ICD-10-CM

## 2020-09-24 DIAGNOSIS — D500 Iron deficiency anemia secondary to blood loss (chronic): Secondary | ICD-10-CM

## 2020-09-24 DIAGNOSIS — F17210 Nicotine dependence, cigarettes, uncomplicated: Secondary | ICD-10-CM | POA: Insufficient documentation

## 2020-09-24 DIAGNOSIS — Z79899 Other long term (current) drug therapy: Secondary | ICD-10-CM | POA: Insufficient documentation

## 2020-09-24 LAB — GLUCOSE, POCT (MANUAL RESULT ENTRY): POC Glucose: 81 mg/dl (ref 70–99)

## 2020-09-24 MED ORDER — CLONIDINE HCL 0.1 MG PO TABS
0.1000 mg | ORAL_TABLET | Freq: Once | ORAL | Status: AC
  Administered 2020-09-24: 0.1 mg via ORAL

## 2020-09-24 MED ORDER — CLONIDINE HCL 0.1 MG PO TABS: 0.1000 mg | ORAL_TABLET | Freq: Three times a day (TID) | ORAL | 3 refills | Status: DC

## 2020-09-24 MED ORDER — TRIAMCINOLONE ACETONIDE 0.1 % EX CREA: 1.0000 "application " | TOPICAL_CREAM | Freq: Two times a day (BID) | CUTANEOUS | 0 refills | Status: DC

## 2020-09-24 MED ORDER — AMLODIPINE BESYLATE 5 MG PO TABS
5.0000 mg | ORAL_TABLET | Freq: Every day | ORAL | 1 refills | Status: DC
Start: 2020-09-24 — End: 2020-10-09

## 2020-09-24 MED ORDER — LOSARTAN POTASSIUM 50 MG PO TABS
50.0000 mg | ORAL_TABLET | Freq: Every day | ORAL | 1 refills | Status: DC
Start: 2020-09-24 — End: 2020-10-09

## 2020-09-24 NOTE — Assessment & Plan Note (Signed)
History of lower GI bleeding from cecal colon cancer recurrence  Follow-up CBC continue iron supplementation

## 2020-09-24 NOTE — Assessment & Plan Note (Signed)
  .   Current smoking consumption amount: 1 pack every 2 days  . Dicsussion on advise to quit smoking and smoking impacts: Cardiovascular health  . Patient's willingness to quit: Willing to quit  . Methods to quit smoking discussed: Behavioral modification cannot use nicotine replacement  . Medication management of smoking session drugs discussed: Cannot use any medications will need behavioral modification alone  . Resources provided:  AVS   . Setting quit date not established  . Follow-up arranged 2 weeks   Time spent counseling the patient: 5 minutes

## 2020-09-24 NOTE — Progress Notes (Signed)
Subjective:    Patient ID: Aaron Roth, male    DOB: 12/28/1945, 74 y.o.   MRN: [id number]  09/24/2020 74 y.o.M f/u from 08/14/20 OV with Dr Wynetta Emery  This patient is a post hospital follow-up and now to establish for primary care.  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  Hemoglobin was down to 3.8 follow-up hemoglobin was up to 8.2 patient still on iron supplementation and has planned surgery to further resect the colon cancer  On arrival patient's blood pressure is 192/101 he has been out of his blood pressure medications on arrival A1c is less than 6 he has received his Materna Covid vaccines in February March of this year  The patient does smoke a pack a day of cigarettes  Past Medical History:  Diagnosis Date  . Cancer ([place]) 07-01-12   colon-surgery and chemotherapy  . 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 Difficult intravenous access 07-01-12   Has port-a-cath at present, but is to be removed 07-14-12.  . Essential hypertension 08/10/2020  . GERD (gastroesophageal reflux disease)      Family History  Problem Relation Age of Onset  . Bone cancer Brother   . ALS Sister   . Cancer Brother        bone, colon, testicular  . Cancer Father        prostate     Social History   Socioeconomic History  . Marital status: Married    Spouse name: Not on file  . Number of children: Not on file  . Years of education: Not on file  . Highest education level: Not on file  Occupational History  . Not on file  Tobacco Use  . Smoking status: Current Every Day Smoker    Packs/day: 0.25    Years: 40.00    Pack years: 10.00    Types: Cigarettes  . Smokeless tobacco: Never Used  Vaping Use  . Vaping Use: Never used  Substance and Sexual Activity  . Alcohol use: No  . Drug use: No  . Sexual activity: Yes  Other Topics Concern  . Not on file   Social History Narrative  . Not on file   Social Determinants of Health   Financial Resource Strain:   . Difficulty of Paying Living Expenses: Not on file  Food Insecurity:   . Worried About Charity fundraiser in the Last Year: Not on file  . Ran Out of Food in the Last Year: Not on file  Transportation Needs:   . Lack of Transportation (Medical): Not on file  . Lack of Transportation (Non-Medical): Not on file  Physical Activity:   . Days of Exercise per Week: Not on file  . Minutes of Exercise per Session: Not on file  Stress:   . Feeling of Stress : Not on file  Social Connections:   . Frequency of Communication with Friends and Family: Not on file  . Frequency of Social Gatherings with Friends and Family: Not on file  . Attends Religious Services: Not on file  . Active Member of Clubs or Organizations: Not on file  . Attends Archivist Meetings: Not on file  . Marital Status: Not on file  Intimate Partner Violence:   . Fear of Current or Ex-Partner: Not on file  . Emotionally Abused: Not on file  .  Physically Abused: Not on file  . Sexually Abused: Not on file     Allergies  Allergen Reactions  . Other     Pre-breaded shrimp     Outpatient Medications Prior to Visit  Medication Sig Dispense Refill  . ferrous sulfate 325 (65 FE) MG tablet Take 1 tablet (325 mg total) by mouth daily with breakfast. 90 tablet 0  . Nutritional Supplements (IMMUNE ENHANCE) TABS Take 1 tablet by mouth daily.    Marland Kitchen losartan (COZAAR) 25 MG tablet Take 1 tablet (25 mg total) by mouth daily. 30 tablet 1  . triamcinolone cream (KENALOG) 0.1 % Apply 1 application topically 2 (two) times daily. 30 g 0  . amLODipine (NORVASC) 5 MG tablet Take 1 tablet (5 mg total) by mouth daily. 30 tablet 0  . geriatric multivitamins-minerals (ELDERTONIC/GEVRABON) ELIX Take 15 mLs by mouth daily. (Patient not taking: Reported on 09/24/2020)     No facility-administered medications prior to visit.       Review of Systems  HENT: Negative.   Respiratory: Negative.   Cardiovascular: Negative.   Gastrointestinal: Negative.   Genitourinary: Negative.   Musculoskeletal: Negative.   Skin: Positive for rash.  Neurological: Negative.   Hematological: Negative.   Psychiatric/Behavioral: Negative.        Objective:   Physical Exam  BP (!) 186/97 (BP Location: Left Arm, Patient Position: Sitting)   Pulse 97   Ht 5\' 7"  (1.702 m)   Wt 172 lb (78 kg)   SpO2 100%   BMI 26.94 kg/m   Gen: Pleasant, well-nourished, in no distress,  normal affect  ENT: No lesions,  mouth clear,  oropharynx clear, no postnasal drip  Neck: No JVD, no TMG, no carotid bruits  Lungs: No use of accessory muscles, no dullness to percussion, clear without rales or rhonchi  Cardiovascular: RRR, heart sounds normal, no murmur or gallops, no peripheral edema  Abdomen: soft and NT, no HSM,  BS normal  Musculoskeletal: No deformities, no cyanosis or clubbing  Neuro: alert, non focal  Skin: Warm, no lesions or rashes        Assessment & Plan:  I personally reviewed all images and lab data in the [facility] system as well as any outside material available during this office visit and agree with the  radiology impressions.   GI bleed History of lower GI bleeding from cecal colon cancer recurrence  Follow-up CBC continue iron supplementation  Essential hypertension Hypertension poorly controlled  Increase losartan to 50 mg daily continue amlodipine 5 mg daily he did receive 1 dose of 0.1 mg of clonidine in the office bring in his pressure down to reasonable areas  Local recurrence of colon cancer ([place]) Plans per general surgery  Dermatitis Continue triamcinolone cream  Iron deficiency anemia due to chronic blood loss Follow-up blood counts continue iron supplementation  Tobacco use    . Current smoking consumption amount: 1 pack every 2 days  . Dicsussion on advise to quit smoking and smoking  impacts: Cardiovascular health  . Patient's willingness to quit: Willing to quit  . Methods to quit smoking discussed: Behavioral modification cannot use nicotine replacement  . Medication management of smoking session drugs discussed: Cannot use any medications will need behavioral modification alone  . Resources provided:  AVS   . Setting quit date not established  . Follow-up arranged 2 weeks   Time spent counseling the patient: 5 minutes    Aaron Roth was seen today for establish care.  Diagnoses and all  orders for this visit:  Local recurrence of colon cancer ([place])  Screening for diabetes mellitus (DM) -     POCT glucose (manual entry) -     Cancel: POCT glycosylated hemoglobin (Hb A1C)  Dermatitis -     triamcinolone (KENALOG) 0.1 %; Apply 1 application topically 2 (two) times daily.  Essential hypertension -     Discontinue: cloNIDine (CATAPRES) 0.1 MG tablet; Take 1 tablet (0.1 mg total) by mouth 3 (three) times daily. -     cloNIDine (CATAPRES) tablet 0.1 mg  Gastrointestinal hemorrhage, unspecified gastrointestinal hemorrhage type -     CBC with Differential/Platelet  Need for hepatitis C screening test -     HCV Ab w Reflex to Quant PCR  Symptomatic anemia  Iron deficiency anemia due to chronic blood loss  Tobacco use  Other orders -     losartan (COZAAR) 50 MG tablet; Take 1 tablet (50 mg total) by mouth daily. -     amLODipine (NORVASC) 5 MG tablet; Take 1 tablet (5 mg total) by mouth daily.  Bring patient back in short-term follow-up preop to ensure blood pressure under improved control  Focus on tobacco cessation

## 2020-09-24 NOTE — Patient Instructions (Addendum)
Increase losartan to 50 mg daily  Continue amlodipine 5 mg daily  Stop smoking see below  Lab work today will include blood count and hepatitis C screen  You declined the flu vaccine  Please consider getting the [place] booster , you can call the number below for options of where you could receive the Moderna  Return to see Dr. Joya Gaskins December 8 at approximately 9:30 AM   Smoking Tobacco Information, Adult Smoking tobacco can be harmful to your health. Tobacco contains a poisonous (toxic), colorless chemical called nicotine. Nicotine is addictive. It changes the brain and can make it hard to stop smoking. Tobacco also has other toxic chemicals that can hurt your body and raise your risk of many cancers. How can smoking tobacco affect me? Smoking tobacco puts you at risk for:  Cancer. Smoking is most commonly associated with lung cancer, but can also lead to cancer in other parts of the body.  Chronic obstructive pulmonary disease (COPD). This is a long-term lung condition that makes it hard to breathe. It also gets worse over time.  High blood pressure (hypertension), heart disease, stroke, or heart attack.  Lung infections, such as pneumonia.  Cataracts. This is when the lenses in the eyes become clouded.  Digestive problems. This may include peptic ulcers, heartburn, and gastroesophageal reflux disease (GERD).  Oral health problems, such as gum disease and tooth loss.  Loss of taste and smell. Smoking can affect your appearance by causing:  Wrinkles.  Yellow or stained teeth, fingers, and fingernails. Smoking tobacco can also affect your social life, because:  It may be challenging to find places to smoke when away from home. Many workplaces, [employer], hotels, and public places are tobacco-free.  Smoking is expensive. This is due to the cost of tobacco and the long-term costs of treating health problems from smoking.  Secondhand smoke may affect those around you.  Secondhand smoke can cause lung cancer, breathing problems, and heart disease. Children of smokers have a higher risk for: ? Sudden infant death syndrome (SIDS). ? Ear infections. ? Lung infections. If you currently smoke tobacco, quitting now can help you:  Lead a longer and healthier life.  Look, smell, breathe, and feel better over time.  Save money.  Protect others from the harms of secondhand smoke. What actions can I take to prevent health problems? Quit smoking   Do not start smoking. Quit if you already do.  Make a plan to quit smoking and commit to it. Look for programs to help you and ask your health care provider for recommendations and ideas.  Set a date and write down all the reasons you want to quit.  Let your friends and family know you are quitting so they can help and support you. Consider finding friends who also want to quit. It can be easier to quit with someone else, so that you can support each other.  Talk with your health care provider about using nicotine replacement medicines to help you quit, such as gum, lozenges, patches, sprays, or pills.  Do not replace cigarette smoking with electronic cigarettes, which are commonly called e-cigarettes. The safety of e-cigarettes is not known, and some may contain harmful chemicals.  If you try to quit but return to smoking, stay positive. It is common to slip up when you first quit, so take it one day at a time.  Be prepared for cravings. When you feel the urge to smoke, chew gum or suck on hard candy. Lifestyle  Stay busy and take care of your body.  Drink enough fluid to keep your urine pale yellow.  Get plenty of exercise and eat a healthy diet. This can help prevent weight gain after quitting.  Monitor your eating habits. Quitting smoking can cause you to have a larger appetite than when you smoke.  Find ways to relax. Go out with friends or family to a movie or a restaurant where people do not  smoke.  Ask your health care provider about having regular tests (screenings) to check for cancer. This may include blood tests, imaging tests, and other tests.  Find ways to manage your stress, such as meditation, yoga, or exercise. Where to find support To get support to quit smoking, consider:  Asking your health care provider for more information and resources.  Taking classes to learn more about quitting smoking.  Looking for local organizations that offer resources about quitting smoking.  Joining a support group for people who want to quit smoking in your local community.  Calling the smokefree.gov counselor helpline: 1-800-Quit-Now 509 881 0662) Where to find more information You may find more information about quitting smoking from:  HelpGuide.org: www.helpguide.org  https://hall.com/: smokefree.gov  American Lung Association: www.lung.org Contact a health care provider if you:  Have problems breathing.  Notice that your lips, nose, or fingers turn blue.  Have chest pain.  Are coughing up blood.  Feel faint or you pass out.  Have other health changes that cause you to worry. Summary  Smoking tobacco can negatively affect your health, the health of those around you, your finances, and your social life.  Do not start smoking. Quit if you already do. If you need help quitting, ask your health care provider.  Think about joining a support group for people who want to quit smoking in your local community. There are many effective programs that will help you to quit this behavior. This information is not intended to replace advice given to you by your health care provider. Make sure you discuss any questions you have with your health care provider. Document Revised: 07/14/2019 Document Reviewed: 11/03/2016 Elsevier Patient Education  2020 [employer].

## 2020-09-24 NOTE — Assessment & Plan Note (Signed)
Continue triamcinolone cream.

## 2020-09-24 NOTE — Assessment & Plan Note (Signed)
Plans per general surgery

## 2020-09-24 NOTE — Assessment & Plan Note (Signed)
Follow-up blood counts continue iron supplementation

## 2020-09-24 NOTE — Assessment & Plan Note (Signed)
Hypertension poorly controlled  Increase losartan to 50 mg daily continue amlodipine 5 mg daily he did receive 1 dose of 0.1 mg of clonidine in the office bring in his pressure down to reasonable areas

## 2020-09-25 ENCOUNTER — Telehealth: Payer: Self-pay

## 2020-09-25 LAB — HCV AB W REFLEX TO QUANT PCR: HCV Ab: <0.1 s/co ratio (ref 0.0–0.9)

## 2020-09-25 LAB — HCV INTERPRETATION

## 2020-09-25 NOTE — Telephone Encounter (Signed)
Att to contact pt about lab results. No ans lvm to call ofc. Ok to advise pt of labs if call back   Per Dr. Joya Gaskins- Hep C results- negative.

## 2020-10-03 ENCOUNTER — Telehealth: Payer: Self-pay

## 2020-10-03 NOTE — Telephone Encounter (Signed)
Left voice message for patient to return my call on my direct number regarding consult with Dr. Julieanne Manson scheduled for 12/29 at 10:15 to arrive by 10:00 at [facility].  This will be a couple of weeks after his colon surgery by Dr. Marcello Moores.

## 2020-10-07 ENCOUNTER — Encounter (HOSPITAL_COMMUNITY): Payer: Self-pay

## 2020-10-07 NOTE — Patient Instructions (Addendum)
DUE TO COVID-19 ONLY ONE VISITOR IS ALLOWED TO COME WITH YOU AND STAY IN THE WAITING ROOM ONLY DURING PRE OP AND PROCEDURE DAY OF SURGERY. THE 1 VISITOR  MAY VISIT WITH YOU AFTER SURGERY IN YOUR PRIVATE ROOM DURING VISITING HOURS ONLY!  YOU NEED TO HAVE A COVID 19 TEST ON_12-13-______ @_1 :45______, THIS TEST MUST BE DONE BEFORE SURGERY,  COVID TESTING SITE [place] [address], IT IS ON THE RIGHT GOING OUT [address] APPROXIMATELY  2 MINUTES PAST [employer] ON THE RIGHT. ONCE YOUR COVID TEST IS COMPLETED,  PLEASE BEGIN THE QUARANTINE INSTRUCTIONS AS OUTLINED IN YOUR HANDOUT.                JIOVANNY Roth  10/07/2020   Your procedure is scheduled on: 10-17-20   Report to [facility] Main  Entrance   Report to admitting at       0700 AM     Call this number if you have problems the morning of surgery [phone]    Remember: Follow a clear liquid diet the day of your bowel prep to prevent dehydration  DRINK 2 PRESURGERY ENSURE DRINKS THE NIGHT BEFORE SURGERY AT   1000 PM AND 1 PRESURGERY DRINK THE DAY OF THE PROCEDURE 3 HOURS PRIOR TO SCHEDULED SURGERY. NO SOLIDS AFTER   MIDNIGHT THE DAY PRIOR TO THE SURGERY. NOTHING BY MOUTH EXCEPT CLEAR LIQUIDS UNTIL THREE HOURS PRIOR TO SCHEDULED   SURGERY. PLEASE FINISH PRESURGERY ENSURE DRINK PER SURGEON ORDER 3 HOURS PRIOR TO SCHEDULED SURGERY TIME WHICH   NEEDS TO BE COMPLETED AT __0600 am then nothing by mouth_______.    CLEAR LIQUID DIET   Foods Allowed                                                                            Foods Excluded  Black Coffee and tea, regular and decaf                                liquids that you cannot  Plain Jell-O any favor except red or purple                                           see through such as: Fruit ices (not with fruit pulp)                                                    milk, soups, orange juice  Iced Popsicles                                                    All solid food  Cranberry, grape and apple juices Sports drinks like Gatorade Lightly seasoned clear broth or consume(fat free) Sugar, honey syrup  Sample Menu Breakfast                                Lunch                                     Supper Cranberry juice                    Beef broth                            Chicken broth Jell-O                                     Grape juice                           Apple juice Coffee or tea                        Jell-O                                      Popsicle                                                Coffee or tea                        Coffee or tea  _____________________________________________________________________     BRUSH YOUR TEETH MORNING OF SURGERY AND RINSE YOUR MOUTH OUT, NO CHEWING GUM CANDY OR MINTS.     Take these medicines the morning of surgery with A SIP OF WATER: amlodipine                               You may not have any metal on your body including hair pins and              piercings  Do not wear jewelry,  lotions, powders or perfumes, deodorant                  Men may shave face and neck.   Do not bring valuables to the hospital. Aaron Roth.  Contacts, dentures or bridgework may not be worn into surgery.      Patients discharged the day of surgery will not be allowed to drive home. IF YOU ARE HAVING SURGERY AND GOING HOME THE SAME DAY, YOU MUST HAVE AN ADULT TO DRIVE YOU HOME AND BE WITH YOU FOR 24 HOURS. YOU MAY GO HOME BY TAXI OR UBER OR ORTHERWISE, BUT AN ADULT MUST ACCOMPANY YOU HOME AND STAY WITH YOU FOR 24 HOURS.  Name and phone number of your  driver:  Special Instructions: N/A              Please read over the following fact sheets you were given: _____________________________________________________________________             [facility] - Preparing for Surgery Before surgery,  you can play an important role.  Because skin is not sterile, your skin needs to be as free of germs as possible.  You can reduce the number of germs on your skin by washing with CHG (chlorahexidine gluconate) soap before surgery.  CHG is an antiseptic cleaner which kills germs and bonds with the skin to continue killing germs even after washing. Please DO NOT use if you have an allergy to CHG or antibacterial soaps.  If your skin becomes reddened/irritated stop using the CHG and inform your nurse when you arrive at Short Stay. Do not shave (including legs and underarms) for at least 48 hours prior to the first CHG shower.  You may shave your face/neck. Please follow these instructions carefully:  1.  Shower with CHG Soap the night before surgery and the  morning of Surgery.  2.  If you choose to wash your hair, wash your hair first as usual with your  normal  shampoo.  3.  After you shampoo, rinse your hair and body thoroughly to remove the  shampoo.                           4.  Use CHG as you would any other liquid soap.  You can apply chg directly  to the skin and wash                       Gently with a scrungie or clean washcloth.  5.  Apply the CHG Soap to your body ONLY FROM THE NECK DOWN.   Do not use on face/ open                           Wound or open sores. Avoid contact with eyes, ears mouth and genitals (private parts).                       Wash face,  Genitals (private parts) with your normal soap.             6.  Wash thoroughly, paying special attention to the area where your surgery  will be performed.  7.  Thoroughly rinse your body with warm water from the neck down.  8.  DO NOT shower/wash with your normal soap after using and rinsing off  the CHG Soap.                9.  Pat yourself dry with a clean towel.            10.  Wear clean pajamas.            11.  Place clean sheets on your bed the night of your first shower and do not  sleep with pets. Day of Surgery : Do not  apply any lotions/deodorants the morning of surgery.  Please wear clean clothes to the hospital/surgery center.  FAILURE TO FOLLOW THESE INSTRUCTIONS MAY RESULT IN THE CANCELLATION OF YOUR SURGERY PATIENT SIGNATURE_________________________________  NURSE SIGNATURE__________________________________  ________________________________________________________________________   Adam Phenix  An incentive spirometer is a tool that can help keep your  lungs clear and active. This tool measures how well you are filling your lungs with each breath. Taking long deep breaths may help reverse or decrease the chance of developing breathing (pulmonary) problems (especially infection) following:  A long period of time when you are unable to move or be active. BEFORE THE PROCEDURE   If the spirometer includes an indicator to show your best effort, your nurse or respiratory therapist will set it to a desired goal.  If possible, sit up straight or lean slightly forward. Try not to slouch.  Hold the incentive spirometer in an upright position. INSTRUCTIONS FOR USE  1. Sit on the edge of your bed if possible, or sit up as far as you can in bed or on a chair. 2. Hold the incentive spirometer in an upright position. 3. Breathe out normally. 4. Place the mouthpiece in your mouth and seal your lips tightly around it. 5. Breathe in slowly and as deeply as possible, raising the piston or the ball toward the top of the column. 6. Hold your breath for 3-5 seconds or for as long as possible. Allow the piston or ball to fall to the bottom of the column. 7. Remove the mouthpiece from your mouth and breathe out normally. 8. Rest for a few seconds and repeat Steps 1 through 7 at least 10 times every 1-2 hours when you are awake. Take your time and take a few normal breaths between deep breaths. 9. The spirometer may include an indicator to show your best effort. Use the indicator as a goal to work toward during  each repetition. 10. After each set of 10 deep breaths, practice coughing to be sure your lungs are clear. If you have an incision (the cut made at the time of surgery), support your incision when coughing by placing a pillow or rolled up towels firmly against it. Once you are able to get out of bed, walk around indoors and cough well. You may stop using the incentive spirometer when instructed by your caregiver.  RISKS AND COMPLICATIONS  Take your time so you do not get dizzy or light-headed.  If you are in pain, you may need to take or ask for pain medication before doing incentive spirometry. It is harder to take a deep breath if you are having pain. AFTER USE  Rest and breathe slowly and easily.  It can be helpful to keep track of a log of your progress. Your caregiver can provide you with a simple table to help with this. If you are using the spirometer at home, follow these instructions: Sarah Ann IF:   You are having difficultly using the spirometer.  You have trouble using the spirometer as often as instructed.  Your pain medication is not giving enough relief while using the spirometer.  You develop fever of 100.5 F (38.1 C) or higher. SEEK IMMEDIATE MEDICAL CARE IF:   You cough up bloody sputum that had not been present before.  You develop fever of 102 F (38.9 C) or greater.  You develop worsening pain at or near the incision site. MAKE SURE YOU:   Understand these instructions.  Will watch your condition.  Will get help right away if you are not doing well or get worse. Document Released: 03/01/2007 Document Revised: 01/11/2012 Document Reviewed: 05/02/2007 [facility] Patient Information 2014 [place], [place].   ________________________________________________________________________

## 2020-10-09 ENCOUNTER — Other Ambulatory Visit: Payer: Self-pay

## 2020-10-09 ENCOUNTER — Encounter: Payer: Self-pay | Admitting: Critical Care Medicine

## 2020-10-09 ENCOUNTER — Encounter (HOSPITAL_COMMUNITY): Payer: Self-pay | Admitting: General Surgery

## 2020-10-09 ENCOUNTER — Encounter (HOSPITAL_COMMUNITY)
Admission: RE | Admit: 2020-10-09 | Discharge: 2020-10-09 | Disposition: A | Discharge status: Home / Self Care | Payer: Self-pay | Source: Ambulatory Visit | Attending: General Surgery | Admitting: General Surgery

## 2020-10-09 ENCOUNTER — Ambulatory Visit: Payer: Self-pay | Attending: Critical Care Medicine | Admitting: Critical Care Medicine

## 2020-10-09 ENCOUNTER — Encounter (HOSPITAL_COMMUNITY): Payer: Self-pay

## 2020-10-09 VITALS — BP 174/91 | HR 88 | Temp 98.5°F | Resp 20 | Wt 179.0 lb

## 2020-10-09 DIAGNOSIS — L259 Unspecified contact dermatitis, unspecified cause: Secondary | ICD-10-CM | POA: Insufficient documentation

## 2020-10-09 DIAGNOSIS — K922 Gastrointestinal hemorrhage, unspecified: Secondary | ICD-10-CM

## 2020-10-09 DIAGNOSIS — I10 Essential (primary) hypertension: Secondary | ICD-10-CM

## 2020-10-09 DIAGNOSIS — I493 Ventricular premature depolarization: Secondary | ICD-10-CM | POA: Insufficient documentation

## 2020-10-09 DIAGNOSIS — Z9049 Acquired absence of other specified parts of digestive tract: Secondary | ICD-10-CM | POA: Insufficient documentation

## 2020-10-09 DIAGNOSIS — Z85038 Personal history of other malignant neoplasm of large intestine: Secondary | ICD-10-CM

## 2020-10-09 DIAGNOSIS — Z01810 Encounter for preprocedural cardiovascular examination: Secondary | ICD-10-CM | POA: Insufficient documentation

## 2020-10-09 DIAGNOSIS — L309 Dermatitis, unspecified: Secondary | ICD-10-CM

## 2020-10-09 DIAGNOSIS — F17210 Nicotine dependence, cigarettes, uncomplicated: Secondary | ICD-10-CM | POA: Insufficient documentation

## 2020-10-09 DIAGNOSIS — Z79899 Other long term (current) drug therapy: Secondary | ICD-10-CM | POA: Insufficient documentation

## 2020-10-09 DIAGNOSIS — Z933 Colostomy status: Secondary | ICD-10-CM | POA: Insufficient documentation

## 2020-10-09 DIAGNOSIS — D500 Iron deficiency anemia secondary to blood loss (chronic): Secondary | ICD-10-CM

## 2020-10-09 DIAGNOSIS — Z720 Tobacco use: Secondary | ICD-10-CM

## 2020-10-09 MED ORDER — AMLODIPINE BESYLATE 10 MG PO TABS: 10.0000 mg | ORAL_TABLET | Freq: Every day | ORAL | 1 refills | Status: DC

## 2020-10-09 MED ORDER — VALSARTAN-HYDROCHLOROTHIAZIDE 320-12.5 MG PO TABS: 1.0000 | ORAL_TABLET | Freq: Every day | ORAL | 1 refills | Status: DC

## 2020-10-09 NOTE — Progress Notes (Addendum)
PCP - [name],  MD Cardiologist - no  PPM/ICD -  Device Orders -  Rep Notified -   Chest x-ray - Ct chest 08-2020 epic EKG - done at preop Stress Test -  ECHO -  Cardiac Cath -  CBC/diff, CMP done 10-09-20 at PCP Dr Joya Gaskins  epic  Sleep Study -  CPAP -   Fasting Blood Sugar -  Checks Blood Sugar _____ times a day  Blood Thinner Instructions: Aspirin Instructions:  ERAS Protcol -y PRE-SURGERY Ensure    COVID TEST- 12-13  Activity---can walk a flight of stairs without SOB, does own yard work without SOB Anesthesia review: HTN, CBC, CMP 10-09-20 done at pcp in epic  Patient denies shortness of breath, fever, cough and chest pain at PAT appointment  NONE   All instructions explained to the patient, with a verbal understanding of the material. Patient agrees to go over the instructions while at home for a better understanding. Patient also instructed to self quarantine after being tested for COVID-19. The opportunity to ask questions was provided.

## 2020-10-09 NOTE — Assessment & Plan Note (Signed)
The patient may receive transfusion during surgery I gave the patient an indication this could occur

## 2020-10-09 NOTE — Assessment & Plan Note (Signed)
Contact dermatitis from hospital in and I told the patient to make the preop nurse aware of this

## 2020-10-09 NOTE — Patient Instructions (Signed)
Increase amlodipine to 10 mg daily you can take 2 of your current tablets daily.a will be a 1  10 mg tablet that you take daily Discontinue losartan Begin valsartan HCT daily for blood pressure Stan your iron supplement  Follow a DASH diet as below watch out for the salt in the restaurants  Blood count and metabolic panels obtained today  Remember to tell the nurse about your reaction to the hospital gowns and sheets  Return to see Dr. Joya Gaskins 1 month  No smoking   DASH Eating Plan DASH stands for "Dietary Approaches to Stop Hypertension." The DASH eating plan is a healthy eating plan that has been shown to reduce high blood pressure (hypertension). It may also reduce your risk for type 2 diabetes, heart disease, and stroke. The DASH eating plan may also help with weight loss. What are tips for following this plan?  General guidelines  Avoid eating more than 2,300 mg (milligrams) of salt (sodium) a day. If you have hypertension, you may need to reduce your sodium intake to 1,500 mg a day.  Limit alcohol intake to no more than 1 drink a day for nonpregnant women and 2 drinks a day for men. One drink equals 12 oz of beer, 5 oz of wine, or 1 oz of hard liquor.  Work with your health care provider to maintain a healthy body weight or to lose weight. Ask what an ideal weight is for you.  Get at least 30 minutes of exercise that causes your heart to beat faster (aerobic exercise) most days of the week. Activities may include walking, swimming, or biking.  Work with your health care provider or diet and nutrition specialist (dietitian) to adjust your eating plan to your individual calorie needs. Reading food labels   Check food labels for the amount of sodium per serving. Choose foods with less than 5 percent of the Daily Value of sodium. Generally, foods with less than 300 mg of sodium per serving fit into this eating plan.  To find whole grains, look for the word "whole" as the first  word in the ingredient list. Shopping  Buy products labeled as "low-sodium" or "no salt added."  Buy fresh foods. Avoid canned foods and premade or frozen meals. Cooking  Avoid adding salt when cooking. Use salt-free seasonings or herbs instead of table salt or sea salt. Check with your health care provider or pharmacist before using salt substitutes.  Do not fry foods. Cook foods using healthy methods such as baking, boiling, grilling, and broiling instead.  Cook with heart-healthy oils, such as olive, canola, soybean, or sunflower oil. Meal planning  Eat a balanced diet that includes: ? 5 or more servings of fruits and vegetables each day. At each meal, try to fill half of your plate with fruits and vegetables. ? Up to 6-8 servings of whole grains each day. ? Less than 6 oz of lean meat, poultry, or fish each day. A 3-oz serving of meat is about the same size as a deck of cards. One egg equals 1 oz. ? 2 servings of low-fat dairy each day. ? A serving of nuts, seeds, or beans 5 times each week. ? Heart-healthy fats. Healthy fats called Omega-3 fatty acids are found in foods such as flaxseeds and coldwater fish, like sardines, salmon, and mackerel.  Limit how much you eat of the following: ? Canned or prepackaged foods. ? Food that is high in trans fat, such as fried foods. ? Food that is  high in saturated fat, such as fatty meat. ? Sweets, desserts, sugary drinks, and other foods with added sugar. ? Full-fat dairy products.  Do not salt foods before eating.  Try to eat at least 2 vegetarian meals each week.  Eat more home-cooked food and less restaurant, buffet, and fast food.  When eating at a restaurant, ask that your food be prepared with less salt or no salt, if possible. What foods are recommended? The items listed may not be a complete list. Talk with your dietitian about what dietary choices are best for you. Grains Whole-grain or whole-wheat bread. Whole-grain or  whole-wheat pasta. Brown rice. [place]row. Bulgur. Whole-grain and low-sodium cereals. Pita bread. Low-fat, low-sodium crackers. Whole-wheat flour tortillas. Vegetables Fresh or frozen vegetables (raw, steamed, roasted, or grilled). Low-sodium or reduced-sodium tomato and vegetable juice. Low-sodium or reduced-sodium tomato sauce and tomato paste. Low-sodium or reduced-sodium canned vegetables. Fruits All fresh, dried, or frozen fruit. Canned fruit in natural juice (without added sugar). Meat and other protein foods Skinless chicken or [place]. Ground chicken or [place]. Pork with fat trimmed off. Fish and seafood. Egg whites. Dried beans, peas, or lentils. Unsalted nuts, nut butters, and seeds. Unsalted canned beans. Lean cuts of beef with fat trimmed off. Low-sodium, lean deli meat. Dairy Low-fat (1%) or fat-free (skim) milk. Fat-free, low-fat, or reduced-fat cheeses. Nonfat, low-sodium ricotta or cottage cheese. Low-fat or nonfat yogurt. Low-fat, low-sodium cheese. Fats and oils Soft margarine without trans fats. Vegetable oil. Low-fat, reduced-fat, or light mayonnaise and salad dressings (reduced-sodium). Canola, safflower, olive, soybean, and sunflower oils. Avocado. Seasoning and other foods Herbs. Spices. Seasoning mixes without salt. Unsalted popcorn and pretzels. Fat-free sweets. What foods are not recommended? The items listed may not be a complete list. Talk with your dietitian about what dietary choices are best for you. Grains Baked goods made with fat, such as croissants, muffins, or some breads. Dry pasta or rice meal packs. Vegetables Creamed or fried vegetables. Vegetables in a cheese sauce. Regular canned vegetables (not low-sodium or reduced-sodium). Regular canned tomato sauce and paste (not low-sodium or reduced-sodium). Regular tomato and vegetable juice (not low-sodium or reduced-sodium). Angie Fava. Olives. Fruits Canned fruit in a light or heavy syrup. Fried fruit. Fruit  in cream or butter sauce. Meat and other protein foods Fatty cuts of meat. Ribs. Fried meat. Berniece Salines. Sausage. Bologna and other processed lunch meats. Salami. Fatback. Hotdogs. Bratwurst. Salted nuts and seeds. Canned beans with added salt. Canned or smoked fish. Whole eggs or egg yolks. Chicken or [place] with skin. Dairy Whole or 2% milk, cream, and half-and-half. Whole or full-fat cream cheese. Whole-fat or sweetened yogurt. Full-fat cheese. Nondairy creamers. Whipped toppings. Processed cheese and cheese spreads. Fats and oils Butter. Stick margarine. Lard. Shortening. Ghee. Bacon fat. Tropical oils, such as coconut, palm kernel, or palm oil. Seasoning and other foods Salted popcorn and pretzels. Onion salt, garlic salt, seasoned salt, table salt, and sea salt. Worcestershire sauce. Tartar sauce. Barbecue sauce. Teriyaki sauce. Soy sauce, including reduced-sodium. Steak sauce. Canned and packaged gravies. Fish sauce. Oyster sauce. Cocktail sauce. Horseradish that you find on the shelf. Ketchup. Mustard. Meat flavorings and tenderizers. Bouillon cubes. Hot sauce and Tabasco sauce. Premade or packaged marinades. Premade or packaged taco seasonings. Relishes. Regular salad dressings. Where to find more information:  National Heart, Lung, and [place]: https://wilson-eaton.com/  American Heart Association: www.heart.org Summary  The DASH eating plan is a healthy eating plan that has been shown to reduce high blood pressure (hypertension). It  may also reduce your risk for type 2 diabetes, heart disease, and stroke.  With the DASH eating plan, you should limit salt (sodium) intake to 2,300 mg a day. If you have hypertension, you may need to reduce your sodium intake to 1,500 mg a day.  When on the DASH eating plan, aim to eat more fresh fruits and vegetables, whole grains, lean proteins, low-fat dairy, and heart-healthy fats.  Work with your health care provider or diet and nutrition specialist  (dietitian) to adjust your eating plan to your individual calorie needs. This information is not intended to replace advice given to you by your health care provider. Make sure you discuss any questions you have with your health care provider. Document Revised: 10/01/2017 Document Reviewed: 10/12/2016 Elsevier Patient Education  Daron Breeding.  Tobacco Use Disorder Tobacco use disorder (TUD) occurs when a person craves, seeks, and uses tobacco, regardless of the consequences. This disorder can cause problems with mental and physical health. It can affect your ability to have healthy relationships, and it can keep you from meeting your responsibilities at work, home, or school. Tobacco may be:  Smoked as a cigarette or cigar.  Inhaled using e-cigarettes.  Smoked in a pipe or hookah.  Chewed as smokeless tobacco.  Inhaled into the nostrils as snuff. Tobacco products contain a dangerous chemical called nicotine, which is very addictive. Nicotine triggers hormones that make the body feel stimulated and works on areas of the brain that make you feel good. These effects can make it hard for people to quit nicotine. Tobacco contains many other unsafe chemicals that can damage almost every organ in the body. Smoking tobacco also puts others in danger due to fire risk and possible health problems caused by breathing in secondhand smoke. What are the signs or symptoms? Symptoms of TUD may include:  Being unable to slow down or stop your tobacco use.  Spending an abnormal amount of time getting or using tobacco.  Craving tobacco. Cravings may last for up to 6 months after quitting.  Tobacco use that: ? Interferes with your work, school, or home life. ? Interferes with your personal and social relationships. ? Makes you give up activities that you once enjoyed or found important.  Using tobacco even though you know that it is: ? Dangerous or bad for your health or someone else's  health. ? Causing problems in your life.  Needing more and more of the substance to get the same effect (developing tolerance).  Experiencing unpleasant symptoms if you do not use the substance (withdrawal). Withdrawal symptoms may include: ? Depressed, anxious, or irritable mood. ? Difficulty concentrating. ? Increased appetite. ? Restlessness or trouble sleeping.  Using the substance to avoid withdrawal. How is this diagnosed? This condition may be diagnosed based on:  Your current and past tobacco use. Your health care provider may ask questions about how your tobacco use affects your life.  A physical exam. You may be diagnosed with TUD if you have at least two symptoms within a 39-month period. How is this treated? This condition is treated by stopping tobacco use. Many people are unable to quit on their own and need help. Treatment may include:  Nicotine replacement therapy (NRT). NRT provides nicotine without the other harmful chemicals in tobacco. NRT gradually lowers the dosage of nicotine in the body and reduces withdrawal symptoms. NRT is available as: ? Over-the-counter gums, lozenges, and skin patches. ? Prescription mouth inhalers and nasal sprays.  Medicine that acts on  the brain to reduce cravings and withdrawal symptoms.  A type of talk therapy that examines your triggers for tobacco use, how to avoid them, and how to cope with cravings (behavioral therapy).  Hypnosis. This may help with withdrawal symptoms.  Joining a support group for others coping with TUD. The best treatment for TUD is usually a combination of medicine, talk therapy, and support groups. Recovery can be a long process. Many people start using tobacco again after stopping (relapse). If you relapse, it does not mean that treatment will not work. Follow these instructions at home:  Lifestyle  Do not use any products that contain nicotine or tobacco, such as cigarettes and e-cigarettes.  Avoid  things that trigger tobacco use as much as you can. Triggers include people and situations that usually cause you to use tobacco.  Avoid drinks that contain caffeine, including coffee. These may worsen some withdrawal symptoms.  Find ways to manage stress. Wanting to smoke may cause stress, and stress can make you want to smoke. Relaxation techniques such as deep breathing, meditation, and yoga may help.  Attend support groups as needed. These groups are an important part of long-term recovery for many people. General instructions  Take over-the-counter and prescription medicines only as told by your health care provider.  Check with your health care provider before taking any new prescription or over-the-counter medicines.  Decide on a friend, family member, or smoking quit-line (such as 1-800-QUIT-NOW in the [place] that you can call or text when you feel the urge to smoke or when you need help coping with cravings.  Keep all follow-up visits as told by your health care provider and therapist. This is important. Contact a health care provider if:  You are not able to take your medicines as prescribed.  Your symptoms get worse, even with treatment. Summary  Tobacco use disorder (TUD) occurs when a person craves, seeks, and uses tobacco regardless of the consequences.  This condition may be diagnosed based on your current and past tobacco use and a physical exam.  Many people are unable to quit on their own and need help. Recovery can be a long process.  The most effective treatment for TUD is usually a combination of medicine, talk therapy, and support groups. This information is not intended to replace advice given to you by your health care provider. Make sure you discuss any questions you have with your health care provider. Document Revised: 10/06/2017 Document Reviewed: 10/06/2017 Elsevier Patient Education  2020 [employer].

## 2020-10-09 NOTE — Assessment & Plan Note (Signed)
Blood pressure still not well controlled plan for this patient will be to begin valsartan HCT at moderate dose and discontinue further losartan  Patient to increase amlodipine to 10 mg daily  The patient is cleared for planned upcoming surgery  We will check metabolic panel and blood count at this visit

## 2020-10-09 NOTE — Progress Notes (Signed)
Concerns with Hgb level Blood sugar

## 2020-10-09 NOTE — Assessment & Plan Note (Signed)
  .   Current smoking consumption amount: 3 cigarettes a day  . Dicsussion on advise to quit smoking and smoking impacts: Adverse outcomes with anesthesia if he continues to smoke  . Patient's willingness to quit: Willing to stop the last 3 cigarettes currently due improve his anesthesia risk  . Methods to quit smoking discussed: Behavioral modification  . Medication management of smoking session drugs discussed: He is not a candidate for medication management  . Resources provided:  AVS   . Setting quit date he is going to quit immediately  . Follow-up arranged 1 month  Time spent counseling the patient: 5 minutes

## 2020-10-09 NOTE — Progress Notes (Signed)
Subjective:    Patient ID: Aaron Roth, male    DOB: Jan 23, 1946, 74 y.o.   MRN: [id number]  09/24/2020 74 y.o.M f/u from 08/14/20 OV with Dr Wynetta Emery  This patient is a post hospital follow-up and now to establish for primary care.  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  Hemoglobin was down to 3.8 follow-up hemoglobin was up to 8.2 patient still on iron supplementation and has planned surgery to further resect the colon cancer  On arrival patient's blood pressure is 192/101 he has been out of his blood pressure medications on arrival A1c is less than 6 he has received his Materna Covid vaccines in February March of this year  The patient does smoke a pack a day of cigarettes  10/09/2020 Patient seen in return follow-up here for preop clearance for planned laparoscopic surgery for recurrent colon cancer in the cecum.  Patient does have iron deficiency anemia from chronic lower GI bleeding.  On arrival blood pressure is 174/91.  He still smoking 3 cigarettes daily.  He still taking quite a bit of salt for tickly with frequent visits to the restaurants in his area.  The dermatitis on his back has resolved itself and it appeared to been a contact dermatitis from hospital linen.   Past Medical History:  Diagnosis Date  . Anemia    07-2020  . Cancer ([place]) 07-01-12   colon-surgery and chemotherapy  . Colostomy care [facility]) 07-01-12   09-24-12 post colon resection  . Difficult intravenous access 07-01-12   Has port-a-cath at present, but is to be removed 07-14-12.  . Essential hypertension 08/10/2020  . GERD (gastroesophageal reflux disease)    pt. denies     Family History  Problem Relation Age of Onset  . Bone cancer Brother   . ALS Sister   . Cancer Brother        bone, colon, testicular  . Cancer Father        prostate     Social History   Socioeconomic History  .  Marital status: Married    Spouse name: Not on file  . Number of children: Not on file  . Years of education: Not on file  . Highest education level: Not on file  Occupational History  . Not on file  Tobacco Use  . Smoking status: Current Some Day Smoker    Packs/day: 0.25    Years: 40.00    Pack years: 10.00    Types: Cigarettes  . Smokeless tobacco: Never Used  Vaping Use  . Vaping Use: Never used  Substance and Sexual Activity  . Alcohol use: No  . Drug use: No  . Sexual activity: Not Currently  Other Topics Concern  . Not on file  Social History Narrative  . Not on file   Social Determinants of Health   Financial Resource Strain:   . Difficulty of Paying Living Expenses: Not on file  Food Insecurity:   . Worried About Charity fundraiser in the Last Year: Not on file  . Ran Out of Food in the Last Year: Not on file  Transportation Needs:   . Lack of Transportation (Medical): Not on file  . Lack of Transportation (Non-Medical): Not on file  Physical Activity:   . Days of Exercise per Week: Not on file  . Minutes of Exercise per Session: Not on file  Stress:   . Feeling of Stress : Not on file  Social Connections:   . Frequency of Communication with Friends and Family: Not on file  . Frequency of Social Gatherings with Friends and Family: Not on file  . Attends Religious Services: Not on file  . Active Member of Clubs or Organizations: Not on file  . Attends Archivist Meetings: Not on file  . Marital Status: Not on file  Intimate Partner Violence:   . Fear of Current or Ex-Partner: Not on file  . Emotionally Abused: Not on file  . Physically Abused: Not on file  . Sexually Abused: Not on file     Allergies  Allergen Reactions  . Other     Pre-breaded shrimp     Outpatient Medications Prior to Visit  Medication Sig Dispense Refill  . ferrous sulfate 325 (65 FE) MG tablet Take 1 tablet (325 mg total) by mouth daily with breakfast. 90 tablet 0   . Nutritional Supplements (IMMUNE ENHANCE) TABS Take 1 tablet by mouth daily.    Marland Kitchen triamcinolone (KENALOG) 0.1 % Apply 1 application topically 2 (two) times daily. 30 g 0  . amLODipine (NORVASC) 5 MG tablet Take 1 tablet (5 mg total) by mouth daily. 90 tablet 1  . losartan (COZAAR) 50 MG tablet Take 1 tablet (50 mg total) by mouth daily. 90 tablet 1   No facility-administered medications prior to visit.      Review of Systems  HENT: Negative.   Respiratory: Negative.   Cardiovascular: Negative.   Gastrointestinal: Negative.   Genitourinary: Negative.   Musculoskeletal: Negative.   Skin: Negative for rash.  Neurological: Negative.   Hematological: Negative.   Psychiatric/Behavioral: Negative.        Objective:   Physical Exam  BP (!) 174/91   Pulse 88   Temp 98.5 F (36.9 C) (Oral)   Resp 20   Wt 179 lb (81.2 kg)   SpO2 100%   BMI 28.04 kg/m   Gen: Pleasant, well-nourished, in no distress,  normal affect  ENT: No lesions,  mouth clear,  oropharynx clear, no postnasal drip  Neck: No JVD, no TMG, no carotid bruits  Lungs: No use of accessory muscles, no dullness to percussion, clear without rales or rhonchi  Cardiovascular: RRR, heart sounds normal, no murmur or gallops, no peripheral edema  Abdomen: soft and NT, no HSM,  BS normal  Musculoskeletal: No deformities, no cyanosis or clubbing  Neuro: alert, non focal  Skin: Warm, no lesions or rashes        Assessment & Plan:  I personally reviewed all images and lab data in the [facility] system as well as any outside material available during this office visit and agree with the  radiology impressions.   Essential hypertension Blood pressure still not well controlled plan for this patient will be to begin valsartan HCT at moderate dose and discontinue further losartan  Patient to increase amlodipine to 10 mg daily  The patient is cleared for planned upcoming surgery  We will check metabolic panel and blood  count at this visit  GI bleed The patient may receive transfusion during surgery I gave the patient an indication this could occur  Dermatitis Contact dermatitis from hospital in and I told the patient to make the preop nurse aware of this  Iron deficiency anemia due to chronic blood loss Check CBC and continue iron supplement  Tobacco use    . Current smoking consumption amount: 3 cigarettes a  day  . Dicsussion on advise to quit smoking and smoking impacts: Adverse outcomes with anesthesia if he continues to smoke  . Patient's willingness to quit: Willing to stop the last 3 cigarettes currently due improve his anesthesia risk  . Methods to quit smoking discussed: Behavioral modification  . Medication management of smoking session drugs discussed: He is not a candidate for medication management  . Resources provided:  AVS   . Setting quit date he is going to quit immediately  . Follow-up arranged 1 month  Time spent counseling the patient: 5 minutes    Diagnoses and all orders for this visit:  Essential hypertension -     Comprehensive metabolic panel -     CBC with Differential/Platelet  History of colon cancer, T3, N2 (4/15 nodes), colectomy 09/25/2011. -     Comprehensive metabolic panel -     CBC with Differential/Platelet  Iron deficiency anemia due to chronic blood loss -     CBC with Differential/Platelet  Gastrointestinal hemorrhage, unspecified gastrointestinal hemorrhage type  Dermatitis  Tobacco use  Other orders -     amLODipine (NORVASC) 10 MG tablet; Take 1 tablet (10 mg total) by mouth daily. -     valsartan-hydrochlorothiazide (DIOVAN-HCT) 320-12.5 MG tablet; Take 1 tablet by mouth daily.

## 2020-10-09 NOTE — Assessment & Plan Note (Signed)
Check CBC and continue iron supplement

## 2020-10-10 ENCOUNTER — Other Ambulatory Visit: Payer: Self-pay | Admitting: Critical Care Medicine

## 2020-10-10 ENCOUNTER — Ambulatory Visit: Payer: Self-pay | Admitting: General Surgery

## 2020-10-10 ENCOUNTER — Telehealth (HOSPITAL_COMMUNITY): Payer: Self-pay | Admitting: General Practice

## 2020-10-10 ENCOUNTER — Telehealth: Payer: Self-pay | Admitting: Critical Care Medicine

## 2020-10-10 DIAGNOSIS — D649 Anemia, unspecified: Secondary | ICD-10-CM

## 2020-10-10 DIAGNOSIS — C189 Malignant neoplasm of colon, unspecified: Secondary | ICD-10-CM

## 2020-10-10 DIAGNOSIS — D500 Iron deficiency anemia secondary to blood loss (chronic): Secondary | ICD-10-CM

## 2020-10-10 LAB — COMPREHENSIVE METABOLIC PANEL
ALT: 6 IU/L (ref 0–44)
AST: 11 IU/L (ref 0–40)
Albumin/Globulin Ratio: 1.4 (ref 1.2–2.2)
Albumin: 3.8 g/dL (ref 3.7–4.7)
Alkaline Phosphatase: 76 IU/L (ref 44–121)
BUN/Creatinine Ratio: 11 (ref 10–24)
BUN: 11 mg/dL (ref 8–27)
Bilirubin Total: <0.2 mg/dL (ref 0.0–1.2)
CO2: 23 mmol/L (ref 20–29)
Calcium: 8.8 mg/dL (ref 8.6–10.2)
Chloride: 102 mmol/L (ref 96–106)
Creatinine, Ser: 1.04 mg/dL (ref 0.76–1.27)
GFR calc Af Amer: 80 mL/min/{1.73_m2} (ref >59)
GFR calc non Af Amer: 69 mL/min/{1.73_m2} (ref >59)
Globulin, Total: 2.8 g/dL (ref 1.5–4.5)
Glucose: 86 mg/dL (ref 65–99)
Potassium: 4.6 mmol/L (ref 3.5–5.2)
Sodium: 138 mmol/L (ref 134–144)
Total Protein: 6.6 g/dL (ref 6.0–8.5)

## 2020-10-10 LAB — CBC WITH DIFFERENTIAL/PLATELET
Basophils Absolute: 0.1 10*3/uL (ref 0.0–0.2)
Basos: 1 %
EOS (ABSOLUTE): 0.1 10*3/uL (ref 0.0–0.4)
Eos: 2 %
Hematocrit: 23.7 % — ABNORMAL LOW (ref 37.5–51.0)
Hemoglobin: 6.8 g/dL — CL (ref 13.0–17.7)
Immature Grans (Abs): 0 10*3/uL (ref 0.0–0.1)
Immature Granulocytes: 1 %
Lymphocytes Absolute: 1.3 10*3/uL (ref 0.7–3.1)
Lymphs: 20 %
MCH: 21.3 pg — ABNORMAL LOW (ref 26.6–33.0)
MCHC: 28.7 g/dL — ABNORMAL LOW (ref 31.5–35.7)
MCV: 74 fL — ABNORMAL LOW (ref 79–97)
Monocytes Absolute: 0.7 10*3/uL (ref 0.1–0.9)
Monocytes: 10 %
Neutrophils Absolute: 4.6 10*3/uL (ref 1.4–7.0)
Neutrophils: 66 %
Platelets: 552 10*3/uL — ABNORMAL HIGH (ref 150–450)
RBC: 3.19 x10E6/uL — ABNORMAL LOW (ref 4.14–5.80)
RDW: 19.8 % — ABNORMAL HIGH (ref 11.6–15.4)
WBC: 6.8 10*3/uL (ref 3.4–10.8)

## 2020-10-10 NOTE — Telephone Encounter (Signed)
Att to contact pt to advise about appt for RBC transfusion has been scheduled for 12/15@9  no ans lvm, if pt calls back pls advise of appt on 12/15

## 2020-10-10 NOTE — Telephone Encounter (Signed)
Phone call from Dr Saralyn Pilar Wright's office wanting to know if the signed and held orders for this patient  can be seen in [place].

## 2020-10-10 NOTE — Telephone Encounter (Signed)
Pt aware Hgb 6.8  To get PRBC Tfx preop

## 2020-10-11 ENCOUNTER — Telehealth: Payer: Self-pay | Admitting: *Deleted

## 2020-10-11 NOTE — Telephone Encounter (Signed)
Patient name and DOB verified. Patient informed of appointment on 10/16/2020. Informed that he would need a type and crossmatch for RBC's. Aware that this is good for 3 days. The Type and screen in 08/2020 is not good for procedure/ transfusion on 10/16/20.

## 2020-10-14 ENCOUNTER — Other Ambulatory Visit (HOSPITAL_COMMUNITY)
Admission: RE | Admit: 2020-10-14 | Discharge: 2020-10-14 | Disposition: A | Discharge status: Home / Self Care | Payer: Self-pay | Source: Ambulatory Visit | Attending: General Surgery | Admitting: General Surgery

## 2020-10-14 DIAGNOSIS — Z01812 Encounter for preprocedural laboratory examination: Secondary | ICD-10-CM | POA: Insufficient documentation

## 2020-10-14 DIAGNOSIS — Z20822 Contact with and (suspected) exposure to covid-19: Secondary | ICD-10-CM | POA: Insufficient documentation

## 2020-10-15 LAB — SARS CORONAVIRUS 2 (TAT 6-24 HRS): SARS Coronavirus 2: NEGATIVE

## 2020-10-16 ENCOUNTER — Other Ambulatory Visit: Payer: Self-pay

## 2020-10-16 ENCOUNTER — Ambulatory Visit (HOSPITAL_COMMUNITY)
Admission: RE | Admit: 2020-10-16 | Discharge: 2020-10-16 | Disposition: A | Discharge status: Home / Self Care | Payer: Self-pay | Source: Ambulatory Visit | Attending: Internal Medicine | Admitting: Internal Medicine

## 2020-10-16 DIAGNOSIS — D500 Iron deficiency anemia secondary to blood loss (chronic): Secondary | ICD-10-CM | POA: Insufficient documentation

## 2020-10-16 DIAGNOSIS — C189 Malignant neoplasm of colon, unspecified: Secondary | ICD-10-CM | POA: Insufficient documentation

## 2020-10-16 DIAGNOSIS — D649 Anemia, unspecified: Secondary | ICD-10-CM

## 2020-10-16 LAB — PREPARE RBC (CROSSMATCH)

## 2020-10-16 LAB — HEMOGLOBIN AND HEMATOCRIT, BLOOD
HCT: 28.6 % — ABNORMAL LOW (ref 39.0–52.0)
Hemoglobin: 8.3 g/dL — ABNORMAL LOW (ref 13.0–17.0)

## 2020-10-16 MED ORDER — BUPIVACAINE LIPOSOME 1.3 % IJ SUSP
20.0000 mL | INTRAMUSCULAR | Status: DC
  Filled 2020-10-16: qty 20

## 2020-10-16 MED ORDER — SODIUM CHLORIDE 0.9% IV SOLUTION: Freq: Once | INTRAVENOUS | Status: AC

## 2020-10-16 NOTE — Progress Notes (Signed)
Anesthesia Chart Review   Case: [id number] Date/Time: 10/17/20 0845   Procedure: LAPAROSCOPIC PARTIAL COLECTOMY POSS OPEN (N/A )   Anesthesia type: General   Pre-op diagnosis: COLON CANCER   Location: [facility]OM 05 / [facility]   Surgeons: [name], MD      [name] y.o. current some day smoker with h/o HTN, GERD, iron deficiency anemia due to chronic blood loss, colon cancer a/p colectomy and colostomy w/reversal scheduled for above procedure 67/89/3810 with Dr. [name].   Hemoglobin 6.8 10/09/20. Pt receiving transfusion prior to surgery, scheduled 10/16/2020. Labs to be repeated.    VS: BP (!) 171/67   Pulse 99   Temp 36.7 C (Oral)   SpO2 100%   PROVIDERS: Elsie Stain, MD is PCP    LABS: labs to be repeated (all labs ordered are listed, but only abnormal results are displayed)  Labs Reviewed - No data to display   IMAGES:   EKG: 10/09/2020 Rate 89 bpm  Sinus rhythm with PVCs Septal infarct, age undetermined No significant change since last tracing  CV:  Past Medical History:  Diagnosis Date  . Anemia    07-2020  . Cancer ([facility]) 07-01-12   colon-surgery and chemotherapy  . Colostomy care [facility]) 07-01-12   09-24-12 post colon resection  . Difficult intravenous access 07-01-12   Has port-a-cath at present, but is to be removed 07-14-12.  . Essential hypertension 08/10/2020  . GERD (gastroesophageal reflux disease)    pt. denies    Past Surgical History:  Procedure Laterality Date  . BIOPSY  08/04/2020   Procedure: BIOPSY;  Surgeon: [name], MD;  Location: [facility];  Service: Endoscopy;;  EGD and COLON  . COLONOSCOPY  06/03/2012   Procedure: COLONOSCOPY;  Surgeon: [name], MD;  Location: [facility];  Service: General;  Laterality: N/A;  . COLONOSCOPY WITH PROPOFOL N/A 08/04/2020   Procedure: COLONOSCOPY WITH PROPOFOL;  Surgeon: [name], MD;  Location: [facility];  Service: Endoscopy;  Laterality: N/A;  . COLOSTOMY   09/25/2011   Procedure: COLOSTOMY;  Surgeon: [name], MD;  Location: [facility];  Service: General;  Laterality: N/A;  . COLOSTOMY TAKEDOWN  07/14/2012   Procedure: LAPAROSCOPIC COLOSTOMY TAKEDOWN;  Surgeon: [name], MD;  Location: [facility];  Service: General;  Laterality: N/A;  laparoscopic coverted to open takedown of colostomy  . ESOPHAGOGASTRODUODENOSCOPY (EGD) WITH PROPOFOL N/A 08/04/2020   Procedure: ESOPHAGOGASTRODUODENOSCOPY (EGD) WITH PROPOFOL;  Surgeon: [name], MD;  Location: [facility];  Service: Endoscopy;  Laterality: N/A;  . GANGLION CYST EXCISION     wrist and foot  . PARTIAL COLECTOMY  09/25/2011   Procedure: PARTIAL COLECTOMY;  Surgeon: [name], MD;  Location: [facility];  Service: General;;  . POLYPECTOMY  08/04/2020   Procedure: POLYPECTOMY;  Surgeon: [name], MD;  Location: [facility];  Service: Endoscopy;;  . PORT-A-CATH REMOVAL  07/14/2012   Procedure: REMOVAL PORT-A-CATH;  Surgeon: [name], MD;  Location: [facility];  Service: General;  Laterality: N/A;  . PORTACATH PLACEMENT  11/10/2011   Procedure: INSERTION PORT-A-CATH;  Surgeon: [name], MD;  Location: [place];  Service: General;  Laterality: Left;  left subclavian  . PORTACATH PLACEMENT  11/10/2011    MEDICATIONS: . amLODipine (NORVASC) 10 MG tablet  . ferrous sulfate 325 (65 FE) MG tablet  . Nutritional Supplements (IMMUNE ENHANCE) TABS  . triamcinolone (KENALOG) 0.1 %  . valsartan-hydrochlorothiazide (DIOVAN-HCT) 320-12.5 MG tablet   No  current facility-administered medications for this encounter.   Marland Kitchen 0.9 %  sodium chloride infusion (Manually program via Guardrails IV Fluids)  . [START ON 10/17/2020] bupivacaine liposome (EXPAREL) 1.3 % injection 266 mg     Aaron Felix, PA-C [facility] [phone]

## 2020-10-16 NOTE — Discharge Instructions (Signed)

## 2020-10-16 NOTE — Progress Notes (Signed)
Type and screen lab drawn. Patient transfused with 1 unit of packed red blood cells. Post transfusion H&H lab drawn. Patient told not to remove the blood bank arm band. Tolerated well, vitals stable, discharge instructions given, verbalized understanding. Patient alert, oriented and ambulatory at the time of discharge.

## 2020-10-17 ENCOUNTER — Other Ambulatory Visit: Payer: Self-pay

## 2020-10-17 ENCOUNTER — Inpatient Hospital Stay (HOSPITAL_COMMUNITY): Payer: Medicare Other | Admitting: Physician Assistant

## 2020-10-17 ENCOUNTER — Encounter (HOSPITAL_COMMUNITY)
Admission: RE | Disposition: A | Discharge status: Home / Self Care | Payer: Self-pay | Source: Home / Self Care | Attending: General Surgery

## 2020-10-17 ENCOUNTER — Encounter (HOSPITAL_COMMUNITY): Payer: Self-pay | Admitting: General Surgery

## 2020-10-17 ENCOUNTER — Inpatient Hospital Stay (HOSPITAL_COMMUNITY)
Admission: RE | Admit: 2020-10-17 | Discharge: 2020-10-19 | DRG: 331 | Disposition: A | Discharge status: Home / Self Care | Payer: Medicare Other | Attending: General Surgery | Admitting: General Surgery

## 2020-10-17 DIAGNOSIS — K219 Gastro-esophageal reflux disease without esophagitis: Secondary | ICD-10-CM | POA: Diagnosis present

## 2020-10-17 DIAGNOSIS — Z9049 Acquired absence of other specified parts of digestive tract: Secondary | ICD-10-CM

## 2020-10-17 DIAGNOSIS — Z800 Family history of malignant neoplasm of digestive organs: Secondary | ICD-10-CM | POA: Diagnosis not present

## 2020-10-17 DIAGNOSIS — K660 Peritoneal adhesions (postprocedural) (postinfection): Secondary | ICD-10-CM | POA: Diagnosis present

## 2020-10-17 DIAGNOSIS — C180 Malignant neoplasm of cecum: Secondary | ICD-10-CM | POA: Diagnosis present

## 2020-10-17 DIAGNOSIS — F17200 Nicotine dependence, unspecified, uncomplicated: Secondary | ICD-10-CM | POA: Diagnosis present

## 2020-10-17 DIAGNOSIS — Z85038 Personal history of other malignant neoplasm of large intestine: Secondary | ICD-10-CM | POA: Diagnosis not present

## 2020-10-17 DIAGNOSIS — C182 Malignant neoplasm of ascending colon: Secondary | ICD-10-CM | POA: Diagnosis present

## 2020-10-17 DIAGNOSIS — I10 Essential (primary) hypertension: Secondary | ICD-10-CM | POA: Diagnosis present

## 2020-10-17 DIAGNOSIS — Z8042 Family history of malignant neoplasm of prostate: Secondary | ICD-10-CM | POA: Diagnosis not present

## 2020-10-17 HISTORY — DX: Anemia, unspecified: D64.9

## 2020-10-17 HISTORY — DX: Malignant neoplasm of ascending colon: C18.2

## 2020-10-17 HISTORY — PX: LAPAROSCOPIC PARTIAL COLECTOMY: SHX5907

## 2020-10-17 LAB — TYPE AND SCREEN
ABO/RH(D): O POS
Antibody Screen: NEGATIVE
Unit division: 0

## 2020-10-17 LAB — BPAM RBC
Blood Product Expiration Date: 202201162359
ISSUE DATE / TIME: 202112151136
Unit Type and Rh: 5100

## 2020-10-17 SURGERY — LAPAROSCOPIC PARTIAL COLECTOMY
Anesthesia: General | Site: Abdomen

## 2020-10-17 MED ORDER — PHENYLEPHRINE 40 MCG/ML (10ML) SYRINGE FOR IV PUSH (FOR BLOOD PRESSURE SUPPORT)
PREFILLED_SYRINGE | INTRAVENOUS | Status: DC | PRN
  Administered 2020-10-17 (×2): 80 ug via INTRAVENOUS

## 2020-10-17 MED ORDER — HYDROMORPHONE HCL 1 MG/ML IJ SOLN: 0.2500 mg | INTRAMUSCULAR | Status: DC | PRN

## 2020-10-17 MED ORDER — DIPHENHYDRAMINE HCL 50 MG/ML IJ SOLN
INTRAMUSCULAR | Status: AC
  Administered 2020-10-17: 13:00:00 12.5 mg via INTRAVENOUS
  Filled 2020-10-17: qty 1

## 2020-10-17 MED ORDER — GABAPENTIN 300 MG PO CAPS
300.0000 mg | ORAL_CAPSULE | ORAL | Status: AC
  Administered 2020-10-17: 08:00:00 300 mg via ORAL
  Filled 2020-10-17: qty 1

## 2020-10-17 MED ORDER — ONDANSETRON HCL 4 MG/2ML IJ SOLN: 4.0000 mg | Freq: Once | INTRAMUSCULAR | Status: DC | PRN

## 2020-10-17 MED ORDER — LIDOCAINE 2% (20 MG/ML) 5 ML SYRINGE
INTRAMUSCULAR | Status: DC | PRN
  Administered 2020-10-17: 60 mg via INTRAVENOUS

## 2020-10-17 MED ORDER — ALUM & MAG HYDROXIDE-SIMETH 200-200-20 MG/5ML PO SUSP: 30.0000 mL | Freq: Four times a day (QID) | ORAL | Status: DC | PRN

## 2020-10-17 MED ORDER — ONDANSETRON HCL 4 MG/2ML IJ SOLN
INTRAMUSCULAR | Status: AC
  Filled 2020-10-17: qty 2

## 2020-10-17 MED ORDER — HYDROMORPHONE HCL 1 MG/ML IJ SOLN: 0.5000 mg | INTRAMUSCULAR | Status: DC | PRN

## 2020-10-17 MED ORDER — ACETAMINOPHEN 500 MG PO TABS
1000.0000 mg | ORAL_TABLET | Freq: Four times a day (QID) | ORAL | Status: DC
  Administered 2020-10-17 – 2020-10-19 (×7): 1000 mg via ORAL
  Filled 2020-10-17 (×8): qty 2

## 2020-10-17 MED ORDER — AMLODIPINE BESYLATE 10 MG PO TABS
10.0000 mg | ORAL_TABLET | Freq: Every day | ORAL | Status: DC
  Administered 2020-10-17 – 2020-10-19 (×3): 10 mg via ORAL
  Filled 2020-10-17 (×3): qty 1

## 2020-10-17 MED ORDER — GABAPENTIN 300 MG PO CAPS
300.0000 mg | ORAL_CAPSULE | Freq: Two times a day (BID) | ORAL | Status: DC
  Administered 2020-10-17 – 2020-10-19 (×5): 300 mg via ORAL
  Filled 2020-10-17 (×5): qty 1

## 2020-10-17 MED ORDER — PHENYLEPHRINE 40 MCG/ML (10ML) SYRINGE FOR IV PUSH (FOR BLOOD PRESSURE SUPPORT)
PREFILLED_SYRINGE | INTRAVENOUS | Status: AC
  Filled 2020-10-17: qty 10

## 2020-10-17 MED ORDER — ENSURE PRE-SURGERY PO LIQD
592.0000 mL | Freq: Once | ORAL | Status: DC
  Filled 2020-10-17: qty 592

## 2020-10-17 MED ORDER — ALVIMOPAN 12 MG PO CAPS
12.0000 mg | ORAL_CAPSULE | ORAL | Status: AC
  Administered 2020-10-17: 08:00:00 12 mg via ORAL
  Filled 2020-10-17: qty 1

## 2020-10-17 MED ORDER — 0.9 % SODIUM CHLORIDE (POUR BTL) OPTIME
TOPICAL | Status: DC | PRN
  Administered 2020-10-17: 09:00:00 2000 mL

## 2020-10-17 MED ORDER — LIDOCAINE 20MG/ML (2%) 15 ML SYRINGE OPTIME
INTRAMUSCULAR | Status: DC | PRN
  Administered 2020-10-17: 1.5 mg/kg/h via INTRAVENOUS

## 2020-10-17 MED ORDER — LACTATED RINGERS IR SOLN
Status: DC | PRN
  Administered 2020-10-17: 1000 mL

## 2020-10-17 MED ORDER — FENTANYL CITRATE (PF) 250 MCG/5ML IJ SOLN
INTRAMUSCULAR | Status: DC | PRN
  Administered 2020-10-17 (×2): 50 ug via INTRAVENOUS
  Administered 2020-10-17: 150 ug via INTRAVENOUS

## 2020-10-17 MED ORDER — MEPERIDINE HCL 50 MG/ML IJ SOLN
6.2500 mg | INTRAMUSCULAR | Status: DC | PRN
Start: 2020-10-17 — End: 2020-10-17

## 2020-10-17 MED ORDER — PROPOFOL 10 MG/ML IV BOLUS
INTRAVENOUS | Status: DC | PRN
  Administered 2020-10-17: 100 mg via INTRAVENOUS

## 2020-10-17 MED ORDER — CHLORHEXIDINE GLUCONATE 0.12 % MT SOLN
15.0000 mL | Freq: Once | OROMUCOSAL | Status: AC
  Administered 2020-10-17: 08:00:00 15 mL via OROMUCOSAL

## 2020-10-17 MED ORDER — ROCURONIUM BROMIDE 10 MG/ML (PF) SYRINGE
PREFILLED_SYRINGE | INTRAVENOUS | Status: DC | PRN
  Administered 2020-10-17: 80 mg via INTRAVENOUS

## 2020-10-17 MED ORDER — BUPIVACAINE-EPINEPHRINE 0.5% -1:200000 IJ SOLN
INTRAMUSCULAR | Status: DC | PRN
  Administered 2020-10-17: 30 mL

## 2020-10-17 MED ORDER — LIDOCAINE HCL (PF) 2 % IJ SOLN
INTRAMUSCULAR | Status: AC
  Filled 2020-10-17: qty 20

## 2020-10-17 MED ORDER — PROPOFOL 10 MG/ML IV BOLUS
INTRAVENOUS | Status: AC
  Filled 2020-10-17: qty 20

## 2020-10-17 MED ORDER — FENTANYL CITRATE (PF) 250 MCG/5ML IJ SOLN
INTRAMUSCULAR | Status: AC
  Filled 2020-10-17: qty 5

## 2020-10-17 MED ORDER — KETAMINE HCL 10 MG/ML IJ SOLN
INTRAMUSCULAR | Status: AC
  Filled 2020-10-17: qty 1

## 2020-10-17 MED ORDER — ORAL CARE MOUTH RINSE: 15.0000 mL | Freq: Once | OROMUCOSAL | Status: AC

## 2020-10-17 MED ORDER — FERROUS SULFATE 325 (65 FE) MG PO TABS
325.0000 mg | ORAL_TABLET | Freq: Every day | ORAL | Status: DC
  Administered 2020-10-19: 09:00:00 325 mg via ORAL
  Filled 2020-10-17: qty 1

## 2020-10-17 MED ORDER — ONDANSETRON HCL 4 MG/2ML IJ SOLN: 4.0000 mg | Freq: Four times a day (QID) | INTRAMUSCULAR | Status: DC | PRN

## 2020-10-17 MED ORDER — VALSARTAN-HYDROCHLOROTHIAZIDE 320-12.5 MG PO TABS: 1.0000 | ORAL_TABLET | Freq: Every day | ORAL | Status: DC

## 2020-10-17 MED ORDER — DIPHENHYDRAMINE HCL 50 MG/ML IJ SOLN: 12.5000 mg | Freq: Once | INTRAMUSCULAR | Status: AC

## 2020-10-17 MED ORDER — SUGAMMADEX SODIUM 200 MG/2ML IV SOLN
INTRAVENOUS | Status: DC | PRN
  Administered 2020-10-17: 200 mg via INTRAVENOUS

## 2020-10-17 MED ORDER — ALVIMOPAN 12 MG PO CAPS
12.0000 mg | ORAL_CAPSULE | Freq: Two times a day (BID) | ORAL | Status: DC
  Administered 2020-10-18: 10:00:00 12 mg via ORAL
  Filled 2020-10-17 (×2): qty 1

## 2020-10-17 MED ORDER — KCL IN DEXTROSE-NACL 20-5-0.45 MEQ/L-%-% IV SOLN
INTRAVENOUS | Status: DC
  Filled 2020-10-17 (×4): qty 1000

## 2020-10-17 MED ORDER — EPHEDRINE 5 MG/ML INJ
INTRAVENOUS | Status: AC
  Filled 2020-10-17: qty 10

## 2020-10-17 MED ORDER — SACCHAROMYCES BOULARDII 250 MG PO CAPS
250.0000 mg | ORAL_CAPSULE | Freq: Two times a day (BID) | ORAL | Status: DC
  Administered 2020-10-17 – 2020-10-19 (×5): 250 mg via ORAL
  Filled 2020-10-17 (×5): qty 1

## 2020-10-17 MED ORDER — BUPIVACAINE LIPOSOME 1.3 % IJ SUSP
INTRAMUSCULAR | Status: DC | PRN
  Administered 2020-10-17: 20 mL

## 2020-10-17 MED ORDER — SIMETHICONE 80 MG PO CHEW: 40.0000 mg | CHEWABLE_TABLET | Freq: Four times a day (QID) | ORAL | Status: DC | PRN

## 2020-10-17 MED ORDER — ONDANSETRON HCL 4 MG PO TABS
4.0000 mg | ORAL_TABLET | Freq: Four times a day (QID) | ORAL | Status: DC | PRN
  Administered 2020-10-17: 23:00:00 4 mg via ORAL
  Filled 2020-10-17: qty 1

## 2020-10-17 MED ORDER — ENSURE PRE-SURGERY PO LIQD
296.0000 mL | Freq: Once | ORAL | Status: DC
  Filled 2020-10-17: qty 296

## 2020-10-17 MED ORDER — IRBESARTAN 150 MG PO TABS
300.0000 mg | ORAL_TABLET | Freq: Every day | ORAL | Status: DC
  Administered 2020-10-17 – 2020-10-19 (×3): 300 mg via ORAL
  Filled 2020-10-17 (×4): qty 2

## 2020-10-17 MED ORDER — ALBUMIN HUMAN 5 % IV SOLN
INTRAVENOUS | Status: AC
  Filled 2020-10-17: qty 500

## 2020-10-17 MED ORDER — BUPIVACAINE-EPINEPHRINE (PF) 0.5% -1:200000 IJ SOLN
INTRAMUSCULAR | Status: AC
  Filled 2020-10-17: qty 30

## 2020-10-17 MED ORDER — ONDANSETRON HCL 4 MG/2ML IJ SOLN
INTRAMUSCULAR | Status: DC | PRN
  Administered 2020-10-17: 4 mg via INTRAVENOUS

## 2020-10-17 MED ORDER — ALBUMIN HUMAN 5 % IV SOLN: INTRAVENOUS | Status: DC | PRN

## 2020-10-17 MED ORDER — HYDROCHLOROTHIAZIDE 12.5 MG PO CAPS
12.5000 mg | ORAL_CAPSULE | Freq: Every day | ORAL | Status: DC
  Administered 2020-10-17 – 2020-10-19 (×3): 12.5 mg via ORAL
  Filled 2020-10-17 (×3): qty 1

## 2020-10-17 MED ORDER — ENOXAPARIN SODIUM 40 MG/0.4ML ~~LOC~~ SOLN
40.0000 mg | SUBCUTANEOUS | Status: DC
  Administered 2020-10-18 – 2020-10-19 (×2): 40 mg via SUBCUTANEOUS
  Filled 2020-10-17 (×2): qty 0.4

## 2020-10-17 MED ORDER — DEXAMETHASONE SODIUM PHOSPHATE 10 MG/ML IJ SOLN
INTRAMUSCULAR | Status: DC | PRN
  Administered 2020-10-17: 10 mg via INTRAVENOUS

## 2020-10-17 MED ORDER — SODIUM CHLORIDE 0.9 % IV SOLN
2.0000 g | INTRAVENOUS | Status: AC
  Administered 2020-10-17: 09:00:00 2 g via INTRAVENOUS
  Filled 2020-10-17: qty 2

## 2020-10-17 MED ORDER — DEXAMETHASONE SODIUM PHOSPHATE 10 MG/ML IJ SOLN
INTRAMUSCULAR | Status: AC
  Filled 2020-10-17: qty 1

## 2020-10-17 MED ORDER — LACTATED RINGERS IV SOLN: INTRAVENOUS | Status: DC

## 2020-10-17 MED ORDER — ACETAMINOPHEN 500 MG PO TABS
1000.0000 mg | ORAL_TABLET | ORAL | Status: AC
  Administered 2020-10-17: 08:00:00 1000 mg via ORAL
  Filled 2020-10-17: qty 2

## 2020-10-17 SURGICAL SUPPLY — 69 items
ADH SKN CLS APL DERMABOND .7 (GAUZE/BANDAGES/DRESSINGS) ×1
APL PRP STRL LF DISP 70% ISPRP (MISCELLANEOUS) ×1
APPLIER CLIP 5 13 M/L LIGAMAX5 (MISCELLANEOUS)
APR CLP MED LRG 5 ANG JAW (MISCELLANEOUS)
BLADE EXTENDED COATED 6.5IN (ELECTRODE) ×3 IMPLANT
CABLE HIGH FREQUENCY MONO STRZ (ELECTRODE) IMPLANT
CELLS DAT CNTRL 66122 CELL SVR (MISCELLANEOUS) IMPLANT
CHLORAPREP W/TINT 26 (MISCELLANEOUS) ×3 IMPLANT
CLIP APPLIE 5 13 M/L LIGAMAX5 (MISCELLANEOUS) IMPLANT
COVER WAND RF STERILE (DRAPES) ×3 IMPLANT
DECANTER SPIKE VIAL GLASS SM (MISCELLANEOUS) ×3 IMPLANT
DERMABOND ADVANCED (GAUZE/BANDAGES/DRESSINGS) ×2
DERMABOND ADVANCED .7 DNX12 (GAUZE/BANDAGES/DRESSINGS) ×1 IMPLANT
DRAIN CHANNEL 19F RND (DRAIN) IMPLANT
DRAPE LAPAROSCOPIC ABDOMINAL (DRAPES) ×3 IMPLANT
DRAPE SURG IRRIG POUCH 19X23 (DRAPES) ×3 IMPLANT
DRSG OPSITE POSTOP 3X4 (GAUZE/BANDAGES/DRESSINGS) ×3 IMPLANT
DRSG OPSITE POSTOP 4X10 (GAUZE/BANDAGES/DRESSINGS) IMPLANT
DRSG OPSITE POSTOP 4X6 (GAUZE/BANDAGES/DRESSINGS) IMPLANT
DRSG OPSITE POSTOP 4X8 (GAUZE/BANDAGES/DRESSINGS) IMPLANT
ELECT REM PT RETURN 15FT ADLT (MISCELLANEOUS) ×3 IMPLANT
EVACUATOR SILICONE 100CC (DRAIN) IMPLANT
GAUZE SPONGE 4X4 12PLY STRL (GAUZE/BANDAGES/DRESSINGS) IMPLANT
GLOVE BIO SURGEON STRL SZ 6.5 (GLOVE) ×4 IMPLANT
GLOVE BIO SURGEONS STRL SZ 6.5 (GLOVE) ×2
GLOVE BIOGEL PI IND STRL 7.0 (GLOVE) ×2 IMPLANT
GLOVE BIOGEL PI INDICATOR 7.0 (GLOVE) ×4
GOWN STRL REUS W/TWL XL LVL3 (GOWN DISPOSABLE) ×18 IMPLANT
GRASPER ENDOPATH ANVIL 10MM (MISCELLANEOUS) IMPLANT
HOLDER FOLEY CATH W/STRAP (MISCELLANEOUS) ×3 IMPLANT
IRRIG SUCT STRYKERFLOW 2 WTIP (MISCELLANEOUS) ×3
IRRIGATION SUCT STRKRFLW 2 WTP (MISCELLANEOUS) ×1 IMPLANT
KIT TURNOVER KIT A (KITS) IMPLANT
PACK COLON (CUSTOM PROCEDURE TRAY) ×3 IMPLANT
PAD POSITIONING PINK XL (MISCELLANEOUS) ×3 IMPLANT
PENCIL SMOKE EVACUATOR (MISCELLANEOUS) IMPLANT
PORT LAP GEL ALEXIS MED 5-9CM (MISCELLANEOUS) ×3 IMPLANT
PROTECTOR NERVE ULNAR (MISCELLANEOUS) ×3 IMPLANT
RELOAD PROXIMATE 75MM BLUE (ENDOMECHANICALS) ×9 IMPLANT
RTRCTR WOUND ALEXIS 18CM MED (MISCELLANEOUS)
SCISSORS LAP 5X35 DISP (ENDOMECHANICALS) ×3 IMPLANT
SEALER TISSUE G2 STRG ARTC 35C (ENDOMECHANICALS) ×3 IMPLANT
SET TUBE SMOKE EVAC HIGH FLOW (TUBING) ×3 IMPLANT
SLEEVE XCEL OPT CAN 5 100 (ENDOMECHANICALS) ×3 IMPLANT
SPONGE DRAIN TRACH 4X4 STRL 2S (GAUZE/BANDAGES/DRESSINGS) IMPLANT
SPONGE LAP 18X18 RF (DISPOSABLE) IMPLANT
STAPLER GUN LINEAR PROX 60 (STAPLE) ×3 IMPLANT
STAPLER PROXIMATE 75MM BLUE (STAPLE) ×3 IMPLANT
SURGILUBE 2OZ TUBE FLIPTOP (MISCELLANEOUS) ×3 IMPLANT
SUT ETHILON 2 0 PS N (SUTURE) IMPLANT
SUT NOVA NAB DX-16 0-1 5-0 T12 (SUTURE) ×6 IMPLANT
SUT PROLENE 2 0 KS (SUTURE) IMPLANT
SUT SILK 2 0 (SUTURE) ×3
SUT SILK 2 0 SH CR/8 (SUTURE) IMPLANT
SUT SILK 2-0 18XBRD TIE 12 (SUTURE) ×1 IMPLANT
SUT SILK 3 0 (SUTURE)
SUT SILK 3 0 SH CR/8 (SUTURE) ×6 IMPLANT
SUT SILK 3-0 18XBRD TIE 12 (SUTURE) IMPLANT
SUT VIC AB 2-0 SH 18 (SUTURE) ×3 IMPLANT
SUT VIC AB 4-0 PS2 27 (SUTURE) ×3 IMPLANT
SUT VICRYL 0 UR6 27IN ABS (SUTURE) ×3 IMPLANT
SYS LAPSCP GELPORT 120MM (MISCELLANEOUS)
SYSTEM LAPSCP GELPORT 120MM (MISCELLANEOUS) IMPLANT
TOWEL OR NON WOVEN STRL DISP B (DISPOSABLE) ×3 IMPLANT
TRAY FOLEY MTR SLVR 16FR STAT (SET/KITS/TRAYS/PACK) IMPLANT
TROCAR BLADELESS OPT 5 100 (ENDOMECHANICALS) ×3 IMPLANT
TROCAR XCEL BLUNT TIP 100MML (ENDOMECHANICALS) IMPLANT
TUBING CONNECTING 10 (TUBING) ×2 IMPLANT
TUBING CONNECTING 10' (TUBING) ×1

## 2020-10-17 NOTE — Anesthesia Preprocedure Evaluation (Signed)
Anesthesia Evaluation  Patient identified by MRN, date of birth, ID band Patient awake    Reviewed: Allergy & Precautions, NPO status , Patient's Chart, lab work & pertinent test results  Airway Mallampati: I  TM Distance: >3 FB Neck ROM: Full    Dental  (+) Poor Dentition   Pulmonary Current Smoker and Patient abstained from smoking.,    Pulmonary exam normal        Cardiovascular hypertension, Pt. on medications Normal cardiovascular exam     Neuro/Psych    GI/Hepatic GERD  Medicated and Controlled,  Endo/Other    Renal/GU      Musculoskeletal   Abdominal   Peds  Hematology   Anesthesia Other Findings   Reproductive/Obstetrics                             Anesthesia Physical Anesthesia Plan  ASA: II  Anesthesia Plan: General   Post-op Pain Management:    Induction: Intravenous  PONV Risk Score and Plan: 1 and Ondansetron and Treatment may vary due to age or medical condition  Airway Management Planned: Oral ETT  Additional Equipment:   Intra-op Plan:   Post-operative Plan: Extubation in OR  Informed Consent: I have reviewed the patients History and Physical, chart, labs and discussed the procedure including the risks, benefits and alternatives for the proposed anesthesia with the patient or authorized representative who has indicated his/her understanding and acceptance.       Plan Discussed with: CRNA and Surgeon  Anesthesia Plan Comments:         Anesthesia Quick Evaluation

## 2020-10-17 NOTE — Anesthesia Procedure Notes (Signed)
Procedure Name: Intubation Date/Time: 10/17/2020 8:56 AM Performed by: Talbot Grumbling, CRNA Pre-anesthesia Checklist: Patient identified, Emergency Drugs available, Suction available and Patient being monitored Patient Re-evaluated:Patient Re-evaluated prior to induction Oxygen Delivery Method: Circle system utilized Preoxygenation: Pre-oxygenation with 100% oxygen Induction Type: IV induction Ventilation: Mask ventilation without difficulty Laryngoscope Size: Mac and 4 Grade View: Grade I Tube type: Oral Tube size: 7.5 mm Number of attempts: 1 Airway Equipment and Method: Stylet Placement Confirmation: ETT inserted through vocal cords under direct vision,  positive ETCO2 and breath sounds checked- equal and bilateral Secured at: 22 cm Tube secured with: Tape Dental Injury: Teeth and Oropharynx as per pre-operative assessment

## 2020-10-17 NOTE — H&P (Signed)
The patient is a 74 year old male who presents with colorectal cancer. 74 year old male who presents to the office for evaluation of a newly diagnosed cecal cancer.  He presented to the hospital with severe anemia and colonoscopy was performed.  This showed a mass in the cecum.  Biopsies confirm adenocarcinoma.  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     Past Surgical History Darden Palmer, [place]; 08/28/2020 2:30 PM) Colon Polyp Removal - Colonoscopy  Colon Removal - Partial    Diagnostic Studies History Darden Palmer, [place]; 08/28/2020 2:30 PM) Colonoscopy  within last year   Allergies Darden Palmer, RMA; 08/28/2020 2:31 PM) No Known Drug Allergies  [08/28/2020]: Allergies Reconciled    Medication History Darden Palmer, [place]; 08/28/2020 2:31 PM) FeroSul  (325 (65 Fe)MG Tablet, Oral) Active. Triamcinolone Acetonide  (0.1% Cream, External) Active. amLODIPine Besylate  (5MG  Tablet, Oral) Active. Medications Reconciled    Social History Darden Palmer, [place]; 08/28/2020 2:30 PM) Alcohol use  Remotely quit alcohol use. Caffeine use  Carbonated beverages. No drug use  Tobacco use  Current some day smoker.   Family History Darden Palmer, [place]; 08/28/2020 2:30 PM) Colon Cancer  Brother. Prostate Cancer  Father.   Other Problems Darden Palmer, [place]; 08/28/2020 2:30 PM) Colon Cancer  High blood pressure  Transfusion history          Review of Systems  General Not Present- Appetite Loss, Chills, Fatigue, Fever, Night Sweats, Weight Gain and Weight Loss. Skin Not Present- Change in Wart/Mole, Dryness, Hives, Jaundice, New Lesions, Non-Healing Wounds, Rash and Ulcer. HEENT Not Present- Earache, Hearing Loss, Hoarseness, Nose Bleed, Oral Ulcers, Ringing in the Ears, Seasonal Allergies, Sinus Pain, Sore Throat, Visual Disturbances, Wears glasses/contact lenses and Yellow Eyes. Respiratory Not Present-  Bloody sputum, Chronic Cough, Difficulty Breathing, Snoring and Wheezing. Breast Not Present- Breast Mass, Breast Pain, Nipple Discharge and Skin Changes. Cardiovascular Present- Leg Cramps. Not Present- Chest Pain, Difficulty Breathing Lying Down, Palpitations, Rapid Heart Rate, Shortness of Breath and Swelling of Extremities. Gastrointestinal Present- Excessive gas. Not Present- Abdominal Pain, Bloating, Bloody Stool, Change in Bowel Habits, Chronic diarrhea, Constipation, Difficulty Swallowing, Gets full quickly at meals, Hemorrhoids, Indigestion, Nausea, Rectal Pain and Vomiting. Musculoskeletal Not Present- Back Pain, Joint Pain, Joint Stiffness, Muscle Pain, Muscle Weakness and Swelling of Extremities. Neurological Not Present- Decreased Memory, Fainting, Headaches, Numbness, Seizures, Tingling, Tremor, Trouble walking and Weakness. Psychiatric Not Present- Anxiety, Bipolar, Change in Sleep Pattern, Depression, Fearful and Frequent crying. Endocrine Not Present- Cold Intolerance, Excessive Hunger, Hair Changes, Heat Intolerance, Hot flashes and New Diabetes. Hematology Not Present- Blood Thinners, Easy Bruising, Excessive bleeding, Gland problems, HIV and Persistent Infections.   BP 129/68   Pulse 86   Temp 98.4 F (36.9 C) (Oral)   Resp 16   Ht 5\' 7"  (1.702 m)   Wt 179 lb (81.2 kg)   SpO2 100%   BMI 28.04 kg/m     Physical Exam    General Mental Status - Alert. General Appearance - Not in acute distress. Build & Nutrition - Well nourished. Posture - Normal posture. Gait - Normal.   Head and Neck Head - normocephalic, atraumatic with no lesions or palpable masses. Trachea - midline.   Chest and Lung Exam Chest and lung exam reveals  - on auscultation, normal breath sounds, no adventitious sounds and normal vocal resonance.   Cardiovascular Cardiovascular examination reveals  - normal heart sounds, regular rate  and rhythm with no murmurs.   Abdomen Inspection Inspection  of the abdomen reveals - No Hernias. Palpation/Percussion Palpation and Percussion of the abdomen reveal - Soft, Non Tender, No Rigidity (guarding), No hepatosplenomegaly and No Palpable abdominal masses.   Neurologic Neurologic evaluation reveals  - alert and oriented x 3 with no impairment of recent or remote memory, normal attention span and ability to concentrate, normal sensation and normal coordination.   Musculoskeletal Normal Exam - Bilateral - Upper Extremity Strength Normal and Lower Extremity Strength Normal.       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 He is status post left colectomy and colostomy reversal approximately 10 years ago. Patient underwent 3 hour lysis of adhesions during colostomy closure. We will plan on a possible long surgery due to his adhesive disease. We have discussed right hemicolectomy in detail. All questions were answered. I was able to discuss this with his son over the phone, as well as his with his wife in person. The surgery and anatomy were described to the patient as well as the risks of surgery and the possible complications. These include: Bleeding, deep abdominal infections and possible wound complications such as hernia and infection, damage to adjacent structures, leak of surgical connections, which can lead to other surgeries and possibly an ostomy, possible need for other procedures, such as abscess drains in radiology, possible prolonged hospital stay, possible diarrhea from removal of part of the colon, possible constipation from narcotics, possible bowel, bladder or sexual dysfunction if having rectal surgery, prolonged fatigue/weakness or appetite loss, possible early recurrence of of disease, possible complications of their medical problems such as heart disease or arrhythmias or lung problems, death (less than 1%). I believe the patient understands and wishes to  proceed with the surgery.

## 2020-10-17 NOTE — Op Note (Signed)
10/17/2020  11:49 AM  PATIENT:  Aaron Roth  74 y.o. male  Patient Care Team: Elsie Stain, MD as PCP - General (Pulmonary Disease) Alphonsa Overall, MD as Consulting Physician (General Surgery) Ladell Pier, MD as Consulting Physician (Internal Medicine) Jonnie Finner, RN as Oncology Nurse Navigator  PRE-OPERATIVE DIAGNOSIS:  COLON CANCER  POST-OPERATIVE DIAGNOSIS:  COLON CANCER  PROCEDURE:   LAPAROSCOPIC RIGHT COLECTOMY   Surgeon(s): [name], MD Michael Boston, MD  ASSISTANT: Dr Johney Maine   ANESTHESIA:   local and general  EBL: 150ml  Total I/O In: 600 [IV Piggyback:600] Out: 190 [Urine:90; Blood:100]  SPECIMEN:  Source of Specimen:  R colon  DISPOSITION OF SPECIMEN:  PATHOLOGY  COUNTS:  YES  PLAN OF CARE: Admit to inpatient   PATIENT DISPOSITION:  PACU - hemodynamically stable.   INDICATIONS: This is a 74 y.o. male who presented to my office with a right sided colon mass. The risk and benefits and alternative treatments were explained to the patient prior to the OR and the patient has elected to proceed with lap vs open R colectomy.  Consent was signed and placed on chart prior to the OR.   OR FINDINGS: significant dense adhesions to the abdominal wall and L pelvis.  Mass in cecum as described.  DESCRIPTION:  The patient was identified & brought into the operating room. The patient was positioned supine with arms tucked. SCDs were active during the entire case. The patient underwent general anesthesia without any difficulty. A foley catheter was inserted under sterile conditions. The abdomen was prepped and draped in a sterile fashion. A Surgical Timeout confirmed our plan.  I made an incision around the umbilical fold. I dissected down through the subcutaneous tissues using cautery.  The fascia was divided with cautery.  Blunt dissection was used to obtain peritoneal entry.  An alexis wound protector was placed and the cap was placed over this.   The abdomen was insufflated to ~15 mmHg.  Camera inspection revealed no injury.  I placed additional ports under direct laparoscopic visualization.  I evaluated the entire abdomen laparoscopically.  There were significant dense adhesions to the abdominal wall and left pelvic sidewall.  Identified a portion of clear abdominal wall in the left upper quadrant and a 5 mm port was placed.  I then began to carefully take down the adhesions to the midline incision using laparoscopic scissors.  I continued this dissection down as far as I can go.  We placed another port in the right lower quadrant under direct visualization.  Were then able to continue to mobilize the small bowel off of the abdominal sidewall.  The peritoneum was opened at midline to help with dense adhesions.  Once we were passed these adhesions, the peritoneum was then transected distally.  The abdomen was then desufflated and we inspected the small bowel that was dissected from the abdominal wall.  Three small serotomies were closed using 3-0 silk sutures.  This was then dropped back into the abdomen and we began to mobilize the cecum in a lateral to medial fashion.  The appendix was freed from the pelvic sidewall.  The terminal ileum was also dissected away carefully.  Several loops of small bowel were densely adherent to the cecum.  Once I mobilized the colon to the level of the hepatic flexure, the abdomen was desufflated once again and these portions of the bowel were removed from the abdomen.  Direct lysis of adhesions was performed using Metzenbaum scissors  until the terminal ileum and cecum were free from the remaining small bowel.  I continue to mobilize the colon and hepatic flexure using blunt dissection and electrocautery.  The omental attachments to the bowel were dissected as well.  The duodenum was bluntly mobilized away from the mesentery.  Once the entire ascending colon was mobilized, the terminal ileum mesentery was divided using a  laparoscopic Enseal device.  The terminal ileum was then transected using a GIA blue load stapler.  I then continue my mesenteric dissection and divided the ileocolic vessels approximately 2 cm distal to its takeoff using a Kelly clamp and the Enseal.  This was oversewn using a 2-0 silk suture.  I identified the right branch of the middle colic.  The transverse colon was transected just proximal to this using another GIA blue load 75 mm stapler.  The remaining mesentery was divided using the laparoscopic Enseal device.  Hemostasis was good.  The specimen was sent to pathology for further examination.    An anastomosis was created between the terminal ileum and the transverse colon in an isoperistaltic fashion. This was done using a GIA blue load stapler.  The common enterotomy channel was closed using a TA 60 blue load stapler. Hemostasis was good at the staple line.  There was good patency of the small bowel to colon.  This was then placed back into the abdomen. The abdomen was then irrigated with normal saline.  Hemostasis was good.  All 4 quadrants of the abdomen were inspected.  There was no sign of injury or bleeding noted.  The omentum was then brought down over the anastomosis. The Alexis wound protector was removed, and we switched to clean instruments, gowns and drapes.  The fascia was then closed using #1 Novafil interrupted sutures.  The subcutaneous tissue of the extraction incision was closed using interrupted 2-0 Vicryl sutures. The skin was then closed using 4-0 Vicryl sutures. Dermabond was placed on the port sites and a sterile dressing was placed over the abdominal incision. All counts were correct per operating room staff. The patient was then awakened from anesthesia and sent to the post anesthesia care unit in stable condition.

## 2020-10-17 NOTE — Transfer of Care (Signed)
Immediate Anesthesia Transfer of Care Note  Patient: LOTTIE SISKA  Procedure(s) Performed: LAPAROSCOPIC RIGHT COLECTOMY (N/A Abdomen)  Patient Location: PACU  Anesthesia Type:General  Level of Consciousness: sedated  Airway & Oxygen Therapy: Patient Spontanous Breathing and Patient connected to face mask oxygen  Post-op Assessment: Report given to RN and Post -op Vital signs reviewed and stable  Post vital signs: Reviewed and stable  Last Vitals:  Vitals Value Taken Time  BP    Temp    Pulse    Resp 15 10/17/20 1201  SpO2    Vitals shown include unvalidated device data.  Last Pain:  Vitals:   10/17/20 0801  TempSrc:   PainSc: 0-No pain         Complications: No complications documented.

## 2020-10-17 NOTE — Anesthesia Postprocedure Evaluation (Signed)
Anesthesia Post Note  Patient: Aaron Roth  Procedure(s) Performed: LAPAROSCOPIC RIGHT COLECTOMY (N/A Abdomen)     Patient location during evaluation: PACU Anesthesia Type: General Level of consciousness: awake and alert Pain management: pain level controlled Vital Signs Assessment: post-procedure vital signs reviewed and stable Respiratory status: spontaneous breathing, nonlabored ventilation, respiratory function stable and patient connected to nasal cannula oxygen Cardiovascular status: blood pressure returned to baseline and stable Postop Assessment: no apparent nausea or vomiting Anesthetic complications: no   No complications documented.  Last Vitals:  Vitals:   10/17/20 1530 10/17/20 1630  BP: (!) 149/72 (!) 163/89  Pulse: 86 88  Resp: 18 16  Temp: 36.7 C 36.4 C  SpO2: 100% 100%    Last Pain:  Vitals:   10/17/20 1630  TempSrc: Oral  PainSc:                  Kylea Berrong DAVID

## 2020-10-18 ENCOUNTER — Encounter (HOSPITAL_COMMUNITY): Payer: Self-pay | Admitting: General Surgery

## 2020-10-18 LAB — BASIC METABOLIC PANEL
Anion gap: 9 (ref 5–15)
BUN: 13 mg/dL (ref 8–23)
CO2: 26 mmol/L (ref 22–32)
Calcium: 8.5 mg/dL — ABNORMAL LOW (ref 8.9–10.3)
Chloride: 98 mmol/L (ref 98–111)
Creatinine, Ser: 1.38 mg/dL — ABNORMAL HIGH (ref 0.61–1.24)
GFR, Estimated: 53 mL/min — ABNORMAL LOW (ref >60)
Glucose, Bld: 139 mg/dL — ABNORMAL HIGH (ref 70–99)
Potassium: 4.5 mmol/L (ref 3.5–5.1)
Sodium: 133 mmol/L — ABNORMAL LOW (ref 135–145)

## 2020-10-18 LAB — CBC
HCT: 26.6 % — ABNORMAL LOW (ref 39.0–52.0)
Hemoglobin: 7.6 g/dL — ABNORMAL LOW (ref 13.0–17.0)
MCH: 21.7 pg — ABNORMAL LOW (ref 26.0–34.0)
MCHC: 28.6 g/dL — ABNORMAL LOW (ref 30.0–36.0)
MCV: 76 fL — ABNORMAL LOW (ref 80.0–100.0)
Platelets: 382 10*3/uL (ref 150–400)
RBC: 3.5 MIL/uL — ABNORMAL LOW (ref 4.22–5.81)
RDW: 22.1 % — ABNORMAL HIGH (ref 11.5–15.5)
WBC: 13.3 10*3/uL — ABNORMAL HIGH (ref 4.0–10.5)
nRBC: 0 % (ref 0.0–0.2)

## 2020-10-18 NOTE — Discharge Instructions (Signed)

## 2020-10-18 NOTE — Progress Notes (Signed)
1 Day Post-Op Lap assisted R colectomy Subjective: Denies nausea or pain, no flatus yet  Objective: Vital signs in last 24 hours: Temp:  [97.3 F (36.3 C)-99.5 F (37.5 C)] 98.4 F (36.9 C) (12/17 0527) Pulse Rate:  [67-91] 67 (12/17 0527) Resp:  [14-22] 14 (12/17 0527) BP: (124-163)/(49-89) 127/61 (12/17 0527) SpO2:  [83 %-100 %] 85 % (12/17 0527) Weight:  [78 kg] 78 kg (12/17 0622)   Intake/Output from previous day: 12/16 0701 - 12/17 0700 In: 3626.4 [I.V.:3026.4; IV Piggyback:600] Out: 7619 [Urine:1440; Blood:100] Intake/Output this shift: No intake/output data recorded.   General appearance: alert and cooperative GI: normal findings: mildly distended  Incision: no significant drainage  Lab Results:  Recent Labs    10/16/20 1435 10/18/20 0525  WBC  --  13.3*  HGB 8.3* 7.6*  HCT 28.6* 26.6*  PLT  --  382   BMET Recent Labs    10/18/20 0525  NA 133*  K 4.5  CL 98  CO2 26  GLUCOSE 139*  BUN 13  CREATININE 1.38*  CALCIUM 8.5*   PT/INR No results for input(s): LABPROT, INR in the last 72 hours. ABG No results for input(s): PHART, HCO3 in the last 72 hours.  Invalid input(s): PCO2, PO2  MEDS, Scheduled . acetaminophen  1,000 mg Oral Q6H  . alvimopan  12 mg Oral BID  . amLODipine  10 mg Oral Daily  . enoxaparin (LOVENOX) injection  40 mg Subcutaneous Q24H  . [START ON 10/19/2020] ferrous sulfate  325 mg Oral Q breakfast  . gabapentin  300 mg Oral BID  . irbesartan  300 mg Oral Daily   And  . hydrochlorothiazide  12.5 mg Oral Daily  . saccharomyces boulardii  250 mg Oral BID    Studies/Results: No results found.  Assessment: s/p Procedure(s): LAPAROSCOPIC RIGHT COLECTOMY Patient Active Problem List   Diagnosis Date Noted  . Colon cancer, ascending ([place]) 10/17/2020  . Tobacco use 09/24/2020  . Local recurrence of colon cancer ([place]) 08/10/2020  . Iron deficiency anemia due to chronic blood loss 08/10/2020  . Essential hypertension  08/10/2020  . Dermatitis 08/10/2020  . Influenza vaccination declined 08/10/2020  . Refused Streptococcus pneumoniae vaccination 08/10/2020  . GI bleed 08/02/2020  . 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 History of colon cancer, T3, N2 (4/15 nodes), colectomy 09/25/2011. 10/14/2011  . GERD (gastroesophageal reflux disease)     Expected post op course  Plan: d/c foley Advance diet to clears ambulate   LOS: 1 day     .Rosario Adie, [facility], [place]    10/18/2020 7:42 AM

## 2020-10-19 LAB — BASIC METABOLIC PANEL
Anion gap: 9 (ref 5–15)
BUN: 10 mg/dL (ref 8–23)
CO2: 26 mmol/L (ref 22–32)
Calcium: 8.7 mg/dL — ABNORMAL LOW (ref 8.9–10.3)
Chloride: 102 mmol/L (ref 98–111)
Creatinine, Ser: 1.13 mg/dL (ref 0.61–1.24)
GFR, Estimated: >60 mL/min (ref >60)
Glucose, Bld: 89 mg/dL (ref 70–99)
Potassium: 4.1 mmol/L (ref 3.5–5.1)
Sodium: 137 mmol/L (ref 135–145)

## 2020-10-19 LAB — CBC
HCT: 25.4 % — ABNORMAL LOW (ref 39.0–52.0)
Hemoglobin: 7.3 g/dL — ABNORMAL LOW (ref 13.0–17.0)
MCH: 21.5 pg — ABNORMAL LOW (ref 26.0–34.0)
MCHC: 28.7 g/dL — ABNORMAL LOW (ref 30.0–36.0)
MCV: 74.7 fL — ABNORMAL LOW (ref 80.0–100.0)
Platelets: 382 10*3/uL (ref 150–400)
RBC: 3.4 MIL/uL — ABNORMAL LOW (ref 4.22–5.81)
RDW: 22 % — ABNORMAL HIGH (ref 11.5–15.5)
WBC: 9.3 10*3/uL (ref 4.0–10.5)
nRBC: 0 % (ref 0.0–0.2)

## 2020-10-19 MED ORDER — HYDROCODONE-ACETAMINOPHEN 5-325 MG PO TABS: 1.0000 | ORAL_TABLET | Freq: Four times a day (QID) | ORAL | 0 refills | Status: DC | PRN

## 2020-10-19 NOTE — Progress Notes (Signed)
2 Days Post-Op   Subjective/Chief Complaint: Feels great  Wants to go home.  Bowels are moving and tolerating liquid diet Walking the halls.  Objective: Vital signs in last 24 hours: Temp:  [98.1 F (36.7 C)-99.4 F (37.4 C)] 98.9 F (37.2 C) (12/18 0503) Pulse Rate:  [67-91] 67 (12/18 0503) Resp:  [14-17] 14 (12/18 0503) BP: (131-147)/(57-70) 133/70 (12/18 0503) SpO2:  [92 %-100 %] 99 % (12/18 0503) Last BM Date: 10/17/20  Intake/Output from previous day: 12/17 0701 - 12/18 0700 In: 1250.7 [P.O.:1080; I.V.:170.7] Out: 1025 [Urine:1025] Intake/Output this shift: No intake/output data recorded.  Abdomen soft nontender without rebound or guarding.  Incision clean dry intact.  No drainage.  No significant pain to palpation.  Lab Results:  Recent Labs    10/18/20 0525 10/19/20 0513  WBC 13.3* 9.3  HGB 7.6* 7.3*  HCT 26.6* 25.4*  PLT 382 382   BMET Recent Labs    10/18/20 0525 10/19/20 0513  NA 133* 137  K 4.5 4.1  CL 98 102  CO2 26 26  GLUCOSE 139* 89  BUN 13 10  CREATININE 1.38* 1.13  CALCIUM 8.5* 8.7*   PT/INR No results for input(s): LABPROT, INR in the last 72 hours. ABG No results for input(s): PHART, HCO3 in the last 72 hours.  Invalid input(s): PCO2, PO2  Studies/Results: No results found.  Anti-infectives: Anti-infectives (From admission, onward)   Start     Dose/Rate Route Frequency Ordered Stop   10/17/20 0715  cefoTEtan (CEFOTAN) 2 g in sodium chloride 0.9 % 100 mL IVPB        2 g 200 mL/hr over 30 Minutes Intravenous On call to O.R. 10/17/20 0263 10/17/20 0858      Assessment/Plan: s/p Procedure(s): LAPAROSCOPIC RIGHT COLECTOMY (N/A) Advance diet  Discharge home after lunch if he can tolerate soft diet without nausea vomiting.  LOS: 2 days    Aaron Roth 10/19/2020

## 2020-10-19 NOTE — Discharge Summary (Signed)
Physician Discharge Summary  Patient ID: Aaron Roth MRN: [id number] DOB/AGE: 12-30-1945 74 y.o.  Admit date: 10/17/2020 Discharge date: 10/19/2020  Admission Diagnoses: Ascending colon cancer  Discharge Diagnoses:  Active Problems:   Colon cancer, ascending [facility])   Discharged Condition: good  Hospital Course: Patient did well after robotic assisted right colectomy.  Postop day 1 his diet was advanced to liquids.  He was ambulating without pain.  Postoperative day 2 he had multiple bowel movements.  He was walking the halls with no evidence of dizziness, headache or instability.  He walked totally across the hospital.  He had no nausea or vomiting.  His diet was advanced and he was discharged home on postoperative day 2.  He had excellent pain control.  His hemoglobin was 7.3 but he started 8.3 preop.  He received no blood products and is on iron.  He had no dizziness or evidence of hypotension.  He was able to walk unassisted through multiple different units of the hospital.  Consults: None  Significant Diagnostic Studies: labs:  CBC Latest Ref Rng & Units 10/19/2020 10/18/2020 10/16/2020  WBC 4.0 - 10.5 K/uL 9.3 13.3(H) -  Hemoglobin 13.0 - 17.0 g/dL 7.3(L) 7.6(L) 8.3(L)  Hematocrit 39.0 - 52.0 % 25.4(L) 26.6(L) 28.6(L)  Platelets 150 - 400 K/uL 382 382 -    Treatments: surgery: Robotic assisted partial colectomy  Discharge Exam: Blood pressure 133/70, pulse 67, temperature 98.9 F (37.2 C), temperature source Oral, resp. rate 14, height 5\' 8"  (1.727 m), weight 78 kg, SpO2 99 %. General appearance: alert and cooperative Head: Normocephalic, without obvious abnormality, atraumatic Resp: clear to auscultation bilaterally Cardio: regular rate and rhythm, S1, S2 normal, no murmur, click, rub or gallop Incision/Wound: Soft nontender without rebound or guarding.  Midline incision with dressing which is clear and intact without any drainage except mild serosanguineous staining of  the dressing.  Bowel sounds present normoactive  Disposition: Discharge disposition: 01-Home or Self Care       Discharge Instructions    Diet - low sodium heart healthy   Complete by: As directed    Increase activity slowly   Complete by: As directed      Allergies as of 10/19/2020      Reactions   Other    Pre-breaded shrimp      Medication List    TAKE these medications   amLODipine 10 MG tablet Commonly known as: NORVASC Take 1 tablet (10 mg total) by mouth daily.   ferrous sulfate 325 (65 FE) MG tablet Take 1 tablet (325 mg total) by mouth daily with breakfast.   HYDROcodone-acetaminophen 5-325 MG tablet Commonly known as: NORCO/VICODIN Take 1 tablet by mouth every 6 (six) hours as needed for moderate pain.   Immune Enhance Tabs Take 1 tablet by mouth daily.   triamcinolone 0.1 % Commonly known as: KENALOG Apply 1 application topically 2 (two) times daily. What changed:   when to take this  reasons to take this   valsartan-hydrochlorothiazide 320-12.5 MG tablet Commonly known as: DIOVAN-HCT Take 1 tablet by mouth daily.        Signed: Turner Daniels MD 10/19/2020, 10:14 AM

## 2020-10-19 NOTE — Progress Notes (Signed)
Nurse reviewed discharge instructions with pt.  Pt verbalized understanding of discharge instructions, follow up appointments and new medication.  No concerns at time of discharge. 

## 2020-10-21 ENCOUNTER — Telehealth: Payer: Self-pay

## 2020-10-21 LAB — SURGICAL PATHOLOGY

## 2020-10-21 NOTE — Telephone Encounter (Signed)
Transition Care Management Follow-up Telephone Call  Call completed with his wife, Penelope.  DPR on file to speak with her.  He was not home at the time of this call and calls would not go through to  his cell #.   Date of discharge and from where: 10/19/2020  How have you been since you were released from the hospital?  She stated the is doing very well.   Any questions or concerns? No  Items Reviewed:  Did the pt receive and understand the discharge instructions provided? Yes   Medications obtained and verified? Yes  - he has all of the medications he had prior to his hospitalization.  Nothing new ordered and he does not want any narcotic pain medication.  She said he does not even want to take tylenol.   Other? No   Any new allergies since your discharge? No   Do you have support at home? Yes   Home Care and Equipment/Supplies: Were home health services ordered?  no If so, what is the name of the agency? n/a  Has the agency set up a time to come to the patient's home? n/a Were any new equipment or medical supplies ordered?  No What is the name of the medical supply agency? n/a Were you able to get the supplies/equipment? n/a Do you have any questions related to the use of the equipment or supplies? No, n/a  Functional Questionnaire: (I = Independent and D = Dependent) ADLs: independent  Follow up appointments reviewed:   PCP Hospital f/u appt confirmed? Yes  - Dr Joya Gaskins 11/14/2020  [facility] f/u appt confirmed? Yes Dr Marcello Moores - 10/30/2020.   Are transportation arrangements needed? No   If their condition worsens, is the pt aware to call PCP or go to the Emergency Dept.? yes  Was the patient provided with contact information for the PCP's office or ED? His wife has the # for [facility]  Was to pt encouraged to call back with questions or concerns? yes

## 2020-10-22 ENCOUNTER — Telehealth: Payer: Self-pay | Admitting: Critical Care Medicine

## 2020-10-22 NOTE — Telephone Encounter (Signed)
Called pt /message received call cannot be completed at this time.

## 2020-10-22 NOTE — Telephone Encounter (Signed)
PT returning call / please advise

## 2020-10-22 NOTE — Telephone Encounter (Signed)
Copied from [place] [id number]. Topic: General - Call Back - No Documentation >> Oct 21, 2020  3:08 PM Erick Blinks wrote: Reason for CRM: Pt's son gerard Orourke called back per request of missed call earlier. Please advise (Missed a call from Dr. Joya Gaskins) "Follow up on surgery last Thursday, wants to discuss pathology report"   Best contact: [phone]

## 2020-10-23 ENCOUNTER — Other Ambulatory Visit: Payer: Self-pay

## 2020-10-23 NOTE — Telephone Encounter (Signed)
Attempt to call.  No answer.

## 2020-10-23 NOTE — Progress Notes (Signed)
The proposed treatment discussed in conference is for discussion purposes only and is not a binding recommendation.  The patients have not been physically examined, or presented with their treatment options.  Therefore, final treatment plans cannot be decided.   

## 2020-10-24 NOTE — Telephone Encounter (Signed)
LVM-  Informed to call surgeon for the results.  Office will be closing soon and closed until Monday.

## 2020-10-24 NOTE — Telephone Encounter (Signed)
Patient returned call. Wants a call back. Please advise

## 2020-10-24 NOTE — Telephone Encounter (Signed)
LVM to return call.

## 2020-10-29 MED FILL — Sodium Chloride IV Soln 0.9%: INTRAVENOUS | Qty: 250 | Status: AC

## 2020-10-30 ENCOUNTER — Telehealth: Payer: Self-pay | Admitting: Nurse Practitioner

## 2020-10-30 ENCOUNTER — Inpatient Hospital Stay: Payer: Self-pay | Admitting: Nurse Practitioner

## 2020-10-30 ENCOUNTER — Other Ambulatory Visit: Payer: Self-pay

## 2020-10-30 NOTE — Telephone Encounter (Signed)
Left message with rescheduled appointment per 12/29 schedule message. Gave option to call back to reschedule if needed.

## 2020-11-05 ENCOUNTER — Telehealth: Payer: Self-pay | Admitting: Critical Care Medicine

## 2020-11-05 NOTE — Telephone Encounter (Signed)
Spoke to patient's wife regarding message.  Clarity was met. Will see patient at next appt.

## 2020-11-05 NOTE — Telephone Encounter (Signed)
Copied from CRM [id number]. Topic: General - Inquiry >> Nov 04, 2020  4:27 PM Daphine Deutscher D wrote: Reason for CRM: Pt's wife called wanting to know why her husband is schedule to see Maisie Fus at the cancer by Dr. Delford Field.  They sais he is already suppose to see Dr. Maisie Fus on the 4th according to the wife  CB#  [phone]   Posey Rea of what this request is referring to. I see that patient has appt tomorrow with Dr. Maisie Fus and did not know if this message needed anymore attention. Please follow up if necessary.

## 2020-11-06 ENCOUNTER — Inpatient Hospital Stay: Payer: Self-pay | Attending: Nurse Practitioner | Admitting: Nurse Practitioner

## 2020-11-06 ENCOUNTER — Encounter: Payer: Self-pay | Admitting: Nurse Practitioner

## 2020-11-06 ENCOUNTER — Other Ambulatory Visit: Payer: Self-pay

## 2020-11-06 ENCOUNTER — Telehealth: Payer: Self-pay | Admitting: Nurse Practitioner

## 2020-11-06 VITALS — BP 155/66 | HR 62 | Temp 98.0°F | Resp 18 | Ht 68.0 in | Wt 165.0 lb

## 2020-11-06 DIAGNOSIS — Z79899 Other long term (current) drug therapy: Secondary | ICD-10-CM

## 2020-11-06 DIAGNOSIS — Z800 Family history of malignant neoplasm of digestive organs: Secondary | ICD-10-CM

## 2020-11-06 DIAGNOSIS — C182 Malignant neoplasm of ascending colon: Secondary | ICD-10-CM

## 2020-11-06 DIAGNOSIS — D509 Iron deficiency anemia, unspecified: Secondary | ICD-10-CM

## 2020-11-06 DIAGNOSIS — Z8042 Family history of malignant neoplasm of prostate: Secondary | ICD-10-CM

## 2020-11-06 DIAGNOSIS — Z808 Family history of malignant neoplasm of other organs or systems: Secondary | ICD-10-CM

## 2020-11-06 DIAGNOSIS — Z85038 Personal history of other malignant neoplasm of large intestine: Secondary | ICD-10-CM

## 2020-11-06 DIAGNOSIS — K219 Gastro-esophageal reflux disease without esophagitis: Secondary | ICD-10-CM

## 2020-11-06 DIAGNOSIS — I10 Essential (primary) hypertension: Secondary | ICD-10-CM

## 2020-11-06 DIAGNOSIS — Z933 Colostomy status: Secondary | ICD-10-CM

## 2020-11-06 DIAGNOSIS — C180 Malignant neoplasm of cecum: Secondary | ICD-10-CM

## 2020-11-06 NOTE — Progress Notes (Addendum)
New Hematology/Oncology Consult   Requesting MD: Dr. [name]  [phone]  Reason for Consult: Colon cancer  HPI: 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  He completed 8 cycles of CAPOX chemotherapy.  He was last seen at the [facility] 06/15/2012.  He presented to urgent care 08/02/2020 with shortness of breath and weakness.  He was told to come to the emergency department due to hemoglobin 4.4.  In the emergency department hemoglobin returned at 3.8.  Stool was positive for occult blood.  He received several units of blood.  CT abdomen/pelvis 08/02/2020 showed a small amount of free fluid in the pelvis, postsurgical change involving the left colon, trace right pleural fluid.  He underwent a colonoscopy 08/04/2020 and was found to have a cecal mass as well as a 3 mm polyp in the ascending colon, 9 mm polyp in the ascending colon, 3 mm polyp in the transverse colon and 4 small polyps in the transverse colon.  Biopsy of the cecal mass showed adenocarcinoma.  Pathology on the polyps showed tubular adenomas with no high-grade dysplasia or malignancy identified.  On 10/17/2020 he underwent a laparoscopic right colectomy by Dr. Marcello Moores.  Pathology showed adenocarcinoma with mucinous features, moderately differentiated, 6.7 cm; no carcinoma identified and 23 lymph nodes; margins uninvolved; no macroscopic tumor perforation identified; tumor invaded through muscularis propria into pericolorectal tissue; no lymphovascular invasion and no perineural invasion; pT3, pN0; mismatch repair protein IHC normal; MSI stable.     Past Medical History:  Diagnosis Date  . Anemia    07-2020  . Cancer ([place]) 07-01-12   colon-surgery and chemotherapy  . Colostomy care [facility]) 07-01-12   09-24-12 post colon resection  . Difficult intravenous access 07-01-12   Has port-a-cath at present, but is to be removed 07-14-12.  . Essential hypertension 08/10/2020  . GERD  (gastroesophageal reflux disease)    pt. denies  :   Past Surgical History:  Procedure Laterality Date  . BIOPSY  08/04/2020   Procedure: BIOPSY;  Surgeon: [name], MD;  Location: [facility] ENDOSCOPY;  Service: Endoscopy;;  EGD and COLON  . COLONOSCOPY  06/03/2012   Procedure: COLONOSCOPY;  Surgeon: [name], MD;  Location: [facility];  Service: General;  Laterality: N/A;  . COLONOSCOPY WITH PROPOFOL N/A 08/04/2020   Procedure: COLONOSCOPY WITH PROPOFOL;  Surgeon: [name], MD;  Location: [facility] ENDOSCOPY;  Service: Endoscopy;  Laterality: N/A;  . COLOSTOMY  09/25/2011   Procedure: COLOSTOMY;  Surgeon: [name], MD;  Location: [facility] ORS;  Service: General;  Laterality: N/A;  . COLOSTOMY TAKEDOWN  07/14/2012   Procedure: LAPAROSCOPIC COLOSTOMY TAKEDOWN;  Surgeon: [name], MD;  Location: [facility] ORS;  Service: General;  Laterality: N/A;  laparoscopic coverted to open takedown of colostomy  . ESOPHAGOGASTRODUODENOSCOPY (EGD) WITH PROPOFOL N/A 08/04/2020   Procedure: ESOPHAGOGASTRODUODENOSCOPY (EGD) WITH PROPOFOL;  Surgeon: [name], MD;  Location: [facility] END[facility];  Service: Endoscopy;  Laterality: N/A;  . GANGLION CYST EXCISION     wrist and foot  . LAPAROSCOPIC PARTIAL COLECTOMY N/A 10/17/2020   Procedure: LAPAROSCOPIC RIGHT COLECTOMY;  Surgeon: [name], MD;  Location: [facility];  Service: General;  Laterality: N/A;  . PARTIAL COLECTOMY  09/25/2011   Procedure: PARTIAL COLECTOMY;  Surgeon: [name], MD;  Location: [facility];  Service: General;;  . POLYPECTOMY  08/04/2020   Procedure: POLYPECTOMY;  Surgeon: [name], MD;  Location: [facility];  Service: Endoscopy;;  .  PORT-A-CATH REMOVAL  07/14/2012   Procedure: REMOVAL PORT-A-CATH;  Surgeon: [name], MD;  Location: [facility];  Service: General;  Laterality: N/A;  . PORTACATH PLACEMENT  11/10/2011   Procedure: INSERTION PORT-A-CATH;  Surgeon: [name], MD;  Location: [facility];  Service:  General;  Laterality: Left;  left subclavian  . PORTACATH PLACEMENT  11/10/2011  :   Current Outpatient Medications:  .  amLODipine (NORVASC) 10 MG tablet, Take 1 tablet (10 mg total) by mouth daily., Disp: 90 tablet, Rfl: 1 .  ferrous sulfate 325 (65 FE) MG tablet, Take 1 tablet (325 mg total) by mouth daily with breakfast., Disp: 90 tablet, Rfl: 0 .  HYDROcodone-acetaminophen (NORCO/VICODIN) 5-325 MG tablet, Take 1 tablet by mouth every 6 (six) hours as needed for moderate pain., Disp: 15 tablet, Rfl: 0 .  Nutritional Supplements (IMMUNE ENHANCE) TABS, Take 1 tablet by mouth daily., Disp: , Rfl:  .  triamcinolone (KENALOG) 0.1 %, Apply 1 application topically 2 (two) times daily. (Patient taking differently: Apply 1 application topically as needed (rash).), Disp: 30 g, Rfl: 0 .  valsartan-hydrochlorothiazide (DIOVAN-HCT) 320-12.5 MG tablet, Take 1 tablet by mouth daily., Disp: 90 tablet, Rfl: 1:  :   Allergies  Allergen Reactions  . Other     Pre-breaded shrimp  :  FH: Father prostate cancer, brother colon cancer, brother "bone cancer"  SOCIAL HISTORY: He lives in [place] with his wife.  He has a son age 45.  Review of Systems: Energy level varies.  Appetite is improving.  No fever.  He denies pain.  No shortness of breath.  Bowels are moving.  No nausea or vomiting.  Occasional mild numbness hands and feet.  Physical Exam:  Blood pressure (!) 155/66, pulse 62, temperature 98 F (36.7 C), temperature source Tympanic, resp. rate 18, height 5' 8"  (1.727 m), weight 165 lb (74.8 kg), SpO2 97 %.  HEENT: Neck without mass. Lungs: Lungs clear bilaterally. Cardiac: Irregular.  Abdomen: Healed midline incision.  No hepatomegaly.  Vascular: No leg edema. Lymph nodes: Small left scalene lymph node.  Shotty bilateral axillary lymph nodes.  No inguinal lymph nodes.  LABS:  Assessment and Plan:  1. Colon cancer   CT abdomen/pelvis 08/02/2020-small amount of free fluid in the pelvis,  postsurgical change involving the left colon, trace right pleural fluid.    Colonoscopy 08/04/2020-cecal mass as well as a 3 mm polyp in the ascending colon, 9 mm polyp in the ascending colon, 3 mm polyp in the transverse colon and 4 small polyps in the transverse colon.  Biopsy of the cecal mass showed adenocarcinoma.  Pathology on the polyps showed tubular adenomas with no high-grade dysplasia or malignancy identified.  CT chest 08/04/2020-no evidence of metastatic disease  CEA 08/04/2020-2.0  Laparoscopic right colectomy 10/17/2020 by Dr. Dorothyann Gibbs showed adenocarcinoma with mucinous features, moderately differentiated, 6.7 cm; no carcinoma identified and 23 lymph nodes; margins uninvolved; no macroscopic tumor perforation identified; tumor invaded through muscularis propria into pericolorectal tissue; no lymphovascular invasion and no perineural invasion, pT3, pN0, mismatch repair protein IHC normal, MSI stable. 2. Iron deficiency anemia October 2021   08/02/2020 hemoglobin 3.8, MCV 56, ferritin 3, stool positive for occult blood  10/19/2020 hemoglobin 7.3, MCV 74 3. 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 He completed cycle 1 CAPOX beginning 11/13/2011; cycle 7 beginning 03/23/2012. Cycle 8 was completed beginning on 04/13/2012  without oxaliplatin. Restaging CT scans 06/06/2012 without evidence of recurrent disease.   4. Family history-brother with colon cancer 5. Multiple polyps on colonoscopy 08/04/2020                                                                                                                                               [name] has a history of stage III colon cancer dating to 2012.  He was recently diagnosed with stage II colon cancer after presenting with severe iron deficiency anemia.  Dr. Benay Spice reviewed the diagnosis and prognosis with him and  his wife at today's appointment.  The cancer does not have high risk features.  No adjuvant chemotherapy is recommended.  We discussed the importance of colonoscopy surveillance.  Due to his personal history of colon cancer x2 and a brother with colon cancer we recommend a referral to genetics.  He agrees and the referral was placed today.  He would also like a referral to the [place] dietitian to discuss healthy diet recommendations.   We reviewed the most recent CBC from 10/19/2020 and discussed a follow-up CBC today.  He prefers to have this done through Dr. Bettina Gavia office.  He will request a copy be sent to our office.  He will continue oral iron.  He will return for a CEA and follow-up visit in 6 months.  Patient seen with Dr. Benay Spice.    Ned Card, NP 11/06/2020, 11:19 AM   This was a shared visit with Ned Card.  Mr. Terrones was interviewed and examined.  He has a remote history of stage III colon cancer and is now diagnosed with new stage II right-sided colon cancer.  The tumor does not have high risk features.  I do not recommend adjuvant systemic therapy.  We discussed diet and exercise maneuvers that may decrease the risk of developing polyps and colorectal cancer.  He has a personal and family history of colon cancer.  He does not appear to have hereditary nonpolyposis colon cancer syndrome.  There were multiple adenomatous polyps on the colonoscopy in October 2021.  He will be referred to the genetics counselor.  He will see Dr. Joya Gaskins for follow-up of the iron deficiency anemia.  Julieanne Manson, MD

## 2020-11-06 NOTE — Telephone Encounter (Signed)
Scheduled appointments per 1/5 los. Spoke to patient who is aware of appointments date and times.  

## 2020-11-07 ENCOUNTER — Ambulatory Visit: Payer: Self-pay | Admitting: Critical Care Medicine

## 2020-11-07 ENCOUNTER — Telehealth: Payer: Self-pay | Admitting: Oncology

## 2020-11-07 NOTE — Telephone Encounter (Signed)
Scheduled appointments per 1/5 los. Spoke to patient who is aware of appointments dates and times. Sent link to patient to set up MyChart for genetic counseling appointment.

## 2020-11-12 ENCOUNTER — Encounter: Payer: Self-pay | Admitting: Nutrition

## 2020-11-12 NOTE — Progress Notes (Unsigned)
Patient did not show or cancel nutrition appointment. Will not reschedule as patient is no longer in treatment.

## 2020-11-14 ENCOUNTER — Other Ambulatory Visit: Payer: Self-pay

## 2020-11-14 ENCOUNTER — Ambulatory Visit: Payer: Self-pay | Attending: Critical Care Medicine | Admitting: Critical Care Medicine

## 2020-11-14 ENCOUNTER — Other Ambulatory Visit: Payer: Self-pay | Admitting: Critical Care Medicine

## 2020-11-14 ENCOUNTER — Encounter: Payer: Self-pay | Admitting: Critical Care Medicine

## 2020-11-14 VITALS — BP 190/117 | HR 74 | Resp 16 | Wt 171.0 lb

## 2020-11-14 DIAGNOSIS — R5383 Other fatigue: Secondary | ICD-10-CM | POA: Insufficient documentation

## 2020-11-14 DIAGNOSIS — D500 Iron deficiency anemia secondary to blood loss (chronic): Secondary | ICD-10-CM

## 2020-11-14 DIAGNOSIS — Z720 Tobacco use: Secondary | ICD-10-CM

## 2020-11-14 DIAGNOSIS — I10 Essential (primary) hypertension: Secondary | ICD-10-CM

## 2020-11-14 DIAGNOSIS — Z7901 Long term (current) use of anticoagulants: Secondary | ICD-10-CM | POA: Insufficient documentation

## 2020-11-14 DIAGNOSIS — Z79899 Other long term (current) drug therapy: Secondary | ICD-10-CM | POA: Insufficient documentation

## 2020-11-14 DIAGNOSIS — K219 Gastro-esophageal reflux disease without esophagitis: Secondary | ICD-10-CM | POA: Insufficient documentation

## 2020-11-14 DIAGNOSIS — K922 Gastrointestinal hemorrhage, unspecified: Secondary | ICD-10-CM

## 2020-11-14 DIAGNOSIS — F17210 Nicotine dependence, cigarettes, uncomplicated: Secondary | ICD-10-CM | POA: Insufficient documentation

## 2020-11-14 DIAGNOSIS — C182 Malignant neoplasm of ascending colon: Secondary | ICD-10-CM

## 2020-11-14 DIAGNOSIS — Z85038 Personal history of other malignant neoplasm of large intestine: Secondary | ICD-10-CM | POA: Insufficient documentation

## 2020-11-14 MED ORDER — VALSARTAN-HYDROCHLOROTHIAZIDE 320-12.5 MG PO TABS
1.0000 | ORAL_TABLET | Freq: Every day | ORAL | 1 refills | Status: DC
Start: 2020-11-14 — End: 2020-11-14

## 2020-11-14 MED ORDER — FERROUS SULFATE 325 (65 FE) MG PO TABS: 325.0000 mg | ORAL_TABLET | Freq: Two times a day (BID) | ORAL | 1 refills | Status: DC

## 2020-11-14 MED ORDER — AMLODIPINE BESYLATE 10 MG PO TABS: 10.0000 mg | ORAL_TABLET | Freq: Every day | ORAL | 1 refills | Status: DC

## 2020-11-14 MED ORDER — CARVEDILOL 6.25 MG PO TABS: 6.2500 mg | ORAL_TABLET | Freq: Two times a day (BID) | ORAL | 3 refills | Status: DC

## 2020-11-14 MED FILL — FERROUS SULFATE 325 MG TAB: 325 (65 FE) | 30 days supply | Qty: 60 | Fill #0

## 2020-11-14 MED FILL — CARVEDILOL 6.25 MG TABLET: 6.25 | 30 days supply | Qty: 60 | Fill #0

## 2020-11-14 MED FILL — AMLODIPINE BESYLATE 10 MG T: 10 | 30 days supply | Qty: 30 | Fill #0

## 2020-11-14 NOTE — Patient Instructions (Addendum)
Begin Coreg take 1 twice daily Continue valsartan HCT and amlodipine daily  Future refills will be sent from our pharmacy and mailed to your home  Increase iron to twice daily with food  Labs today include blood count and iron levels  Focus on your tobacco cessation  Keep your appointments with oncology  Follow a healthy diet as outlined below  You declined the tetanus shot at this visit we will discuss it in the future  Return to see Dr. Joya Gaskins 2 months .  Iron-Rich Diet  Iron is a mineral that helps your body to produce hemoglobin. Hemoglobin is a protein in red blood cells that carries oxygen to your body's tissues. Eating too little iron may cause you to feel weak and tired, and it can increase your risk of infection. Iron is naturally found in many foods, and many foods have iron added to them (iron-fortified foods). You may need to follow an iron-rich diet if you do not have enough iron in your body due to certain medical conditions. The amount of iron that you need each day depends on your age, your sex, and any medical conditions you have. Follow instructions from your health care provider or a diet and nutrition specialist (dietitian) about how much iron you should eat each day. What are tips for following this plan? Reading food labels  Check food labels to see how many milligrams (mg) of iron are in each serving. Cooking  Cook foods in pots and pans that are made from iron.  Take these steps to make it easier for your body to absorb iron from certain foods: ? Soak beans overnight before cooking. ? Soak whole grains overnight and drain them before using. ? Ferment flours before baking, such as by using yeast in bread dough. Meal planning  When you eat foods that contain iron, you should eat them with foods that are high in vitamin C. These include oranges, peppers, tomatoes, potatoes, and mango. Vitamin C helps your body to absorb iron. General information  Take  iron supplements only as told by your health care provider. An overdose of iron can be life-threatening. If you were prescribed iron supplements, take them with orange juice or a vitamin C supplement.  When you eat iron-fortified foods or take an iron supplement, you should also eat foods that naturally contain iron, such as meat, poultry, and fish. Eating naturally iron-rich foods helps your body to absorb the iron that is added to other foods or contained in a supplement.  Certain foods and drinks prevent your body from absorbing iron properly. Avoid eating these foods in the same meal as iron-rich foods or with iron supplements. These foods include: ? Coffee, black tea, and red wine. ? Milk, dairy products, and foods that are high in calcium. ? Beans and soybeans. ? Whole grains. What foods should I eat? Fruits Prunes. Raisins. Eat fruits high in vitamin C, such as oranges, grapefruits, and strawberries, alongside iron-rich foods. Vegetables Spinach (cooked). Green peas. Broccoli. Fermented vegetables. Eat vegetables high in vitamin C, such as leafy greens, potatoes, bell peppers, and tomatoes, alongside iron-rich foods. Grains Iron-fortified breakfast cereal. Iron-fortified whole-wheat bread. Enriched rice. Sprouted grains. Meats and other proteins Beef liver. Oysters. Beef. Shrimp. [place]. Chicken. [place]. Sardines. Chickpeas. Nuts. Tofu. Pumpkin seeds. Beverages Tomato juice. Fresh orange juice. Prune juice. Hibiscus tea. Fortified instant breakfast shakes. Sweets and desserts Blackstrap molasses. Seasonings and condiments Tahini. Fermented soy sauce. Other foods Wheat germ. The items listed above may not  be a complete list of recommended foods and beverages. Contact a dietitian for more information. What foods should I avoid? Grains Whole grains. Bran cereal. Bran flour. Oats. Meats and other proteins Soybeans. Products made from soy protein. Black beans. Lentils. Mung beans.  Split peas. Dairy Milk. Cream. Cheese. Yogurt. Cottage cheese. Beverages Coffee. Black tea. Red wine. Sweets and desserts Cocoa. Chocolate. Ice cream. Other foods Basil. Oregano. Large amounts of parsley. The items listed above may not be a complete list of foods and beverages to avoid. Contact a dietitian for more information. Summary  Iron is a mineral that helps your body to produce hemoglobin. Hemoglobin is a protein in red blood cells that carries oxygen to your body's tissues.  Iron is naturally found in many foods, and many foods have iron added to them (iron-fortified foods).  When you eat foods that contain iron, you should eat them with foods that are high in vitamin C. Vitamin C helps your body to absorb iron.  Certain foods and drinks prevent your body from absorbing iron properly, such as whole grains and dairy products. You should avoid eating these foods in the same meal as iron-rich foods or with iron supplements. This information is not intended to replace advice given to you by your health care provider. Make sure you discuss any questions you have with your health care provider. Document Revised: 10/01/2017 Document Reviewed: 09/14/2017 Elsevier Patient Education  2021 [employer].

## 2020-11-14 NOTE — Progress Notes (Signed)
Follow up appt.

## 2020-11-14 NOTE — Assessment & Plan Note (Signed)
  .   Current smoking consumption amount: 3 cigarettes a day  Dicsussion on advise to quit smoking and smoking impacts: Adverse outcomes  . Patient's willingness to quit:willing to quit  . Methods to quit smoking discussed: Behavioral modification  . Medication management of smoking session drugs discussed: He is not a candidate for medication management  . Resources provided:  AVS   . Setting quit date he is going to quit immediately  . Follow-up arranged 1 month  Time spent counseling the patient: 5 minutes

## 2020-11-14 NOTE — Progress Notes (Signed)
Subjective:    Patient ID: Aaron Roth, male    DOB: 12/15/1945, 75 y.o.   MRN: [id number]  09/24/2020 74 y.o.M f/u from 08/14/20 OV with Dr Wynetta Emery  This patient is a post hospital follow-up and now to establish for primary care.  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  Hemoglobin was down to 3.8 follow-up hemoglobin was up to 8.2 patient still on iron supplementation and has planned surgery to further resect the colon cancer  On arrival patient's blood pressure is 192/101 he has been out of his blood pressure medications on arrival A1c is less than 6 he has received his Materna Covid vaccines in February March of this year  The patient does smoke a pack a day of cigarettes  10/09/2020 Patient seen in return follow-up here for preop clearance for planned laparoscopic surgery for recurrent colon cancer in the cecum.  Patient does have iron deficiency anemia from chronic lower GI bleeding.  On arrival blood pressure is 174/91.  He still smoking 3 cigarettes daily.  He still taking quite a bit of salt for tickly with frequent visits to the restaurants in his area.  The dermatitis on his back has resolved itself and it appeared to been a contact dermatitis from hospital linen.  11/14/2020 Patient was seen in return follow-up and had his recurrent: Skin cancer resected in colon reanastomosed per Dr. Marcello Moores.  This occurred before Christmas.  He is recovering nicely from this.  His discharge hemoglobin was 7.3 preop was 8.3 after 1 unit packed red cell transfusion.  It was hypothesized he had chronic blood loss from the colon that he also appears to have severe iron deficiency anemia with depleted iron stores.  The patient's had follow-up with oncology and they are referring him to nutritionist.  Patient states he has still some fatigue.  He is still smoking 1 to 2 cigarettes a day.  On  arrival blood pressure was 190/117.  It appears he is not taking his blood pressure medicine every day.  He says at times he feels like it is making him dizzy and fatigued.  He has had no bleeding from below that he can tell he is fully healed up in his wounds.  He has not been taking his iron supplement as well.  Past Medical History:  Diagnosis Date  . Anemia    07-2020  . Cancer ([place]) 07-01-12   colon-surgery and chemotherapy  . 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 Colostomy care [facility]) 07-01-12   09-24-12 post colon resection  . 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 Difficult intravenous access 07-01-12   Has port-a-cath at present, but is to be removed 07-14-12.  . Essential hypertension 08/10/2020  . GERD (gastroesophageal reflux disease)    pt. denies     Family History  Problem Relation Age of Onset  . Bone cancer Brother   . ALS Sister   . Cancer Brother        bone, colon, testicular  . Cancer Father        prostate     Social History   Socioeconomic History  . Marital status: Married    Spouse name: Not on file  . Number of children: Not on file  . Years of education: Not on file  . Highest education level: Not on file  Occupational History  . Not on file  Tobacco Use  . Smoking status: Current Some Day Smoker    Packs/day: 0.25    Years: 40.00    Pack years: 10.00    Types: Cigarettes  . Smokeless tobacco: Never Used  Vaping Use  . Vaping Use: Never used  Substance and Sexual Activity  . Alcohol use: No  . Drug use: No  . Sexual activity: Not Currently  Other Topics Concern  . Not on file  Social History Narrative  . Not on file   Social Determinants of Health   Financial Resource Strain: Not on file  Food Insecurity: Not on file  Transportation Needs: Not on file  Physical Activity: Not on file  Stress: Not on file  Social Connections: Not on file  Intimate Partner Violence: Not on file      Allergies  Allergen Reactions  . Other     Pre-breaded shrimp     Outpatient Medications Prior to Visit  Medication Sig Dispense Refill  . Nutritional Supplements (IMMUNE ENHANCE) TABS Take 1 tablet by mouth daily.    Marland Kitchen amLODipine (NORVASC) 10 MG tablet Take 1 tablet (10 mg total) by mouth daily. 90 tablet 1  . ferrous sulfate 325 (65 FE) MG tablet Take 1 tablet (325 mg total) by mouth daily with breakfast. 90 tablet 0  . valsartan-hydrochlorothiazide (DIOVAN-HCT) 320-12.5 MG tablet Take 1 tablet by mouth daily. 90 tablet 1  . HYDROcodone-acetaminophen (NORCO/VICODIN) 5-325 MG tablet Take 1 tablet by mouth every 6 (six) hours as needed for moderate pain. (Patient not taking: Reported on 11/14/2020) 15 tablet 0  . triamcinolone (KENALOG) 0.1 % Apply 1 application topically 2 (two) times daily. (Patient taking differently: Apply 1 application topically as needed (rash).) 30 g 0   No facility-administered medications prior to visit.      Review of Systems  HENT: Negative.   Respiratory: Negative.   Cardiovascular: Negative.   Gastrointestinal: Negative.   Genitourinary: Negative.   Musculoskeletal: Negative.   Skin: Negative for rash.  Neurological: Negative.   Hematological: Negative.   Psychiatric/Behavioral: Negative.        Objective:   Physical Exam  BP (!) 190/117   Pulse 74   Resp 16   Wt 171 lb (77.6 kg)   SpO2 100%   BMI 26.00 kg/m   Gen: Pleasant, well-nourished, in no distress,  normal affect  ENT: No lesions,  mouth clear,  oropharynx clear, no postnasal drip  Neck: No JVD, no TMG, no carotid bruits  Lungs: No use of accessory muscles, no dullness to percussion, clear without rales or rhonchi  Cardiovascular: RRR, heart sounds normal, no murmur or gallops, no peripheral edema  Abdomen: soft and NT, no HSM,  BS normal  Musculoskeletal: No deformities, no cyanosis or clubbing  Neuro: alert, non focal  Skin: Warm, no lesions or rashes         Assessment & Plan:  I personally reviewed all images and lab data in the [facility] system as well as any outside material available during this office visit and agree with the  radiology impressions.   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 Current smoking consumption amount: 3 cigarettes a day  Dicsussion on advise to quit smoking and smoking impacts: Adverse outcomes  . Patient's willingness to quit:willing to quit  . Methods to quit smoking discussed: Behavioral modification  . Medication management of smoking session drugs discussed: He is not a candidate for medication management  . Resources provided:  AVS   . Setting quit date he is going to quit immediately  . Follow-up arranged 1 month  Time spent counseling the patient: 5 minutes    Eliberto was seen today for follow-up.  Diagnoses and all orders for this visit:  Gastrointestinal hemorrhage, unspecified gastrointestinal hemorrhage type -     CBC with Differential/Platelet -     Iron  Iron deficiency anemia due to chronic blood loss -     CBC with Differential/Platelet -     Iron  Colon cancer, ascending ([facility])  Essential hypertension  Tobacco use  Other orders -     ferrous sulfate 325 (65 FE) MG tablet; Take 1 tablet (325 mg total) by mouth 2 (two) times daily with a meal. -     valsartan-hydrochlorothiazide (DIOVAN-HCT) 320-12.5 MG tablet; Take 1 tablet by mouth daily. -     amLODipine (NORVASC) 10 MG tablet; Take 1 tablet (10 mg total) by mouth daily. -     carvedilol (COREG) 6.25 MG tablet; Take 1 tablet (6.25 mg total) by  mouth 2 (two) times daily with a meal.

## 2020-11-14 NOTE — Assessment & Plan Note (Signed)
Continue valsartan HCT and amlodipine and add Coreg 6.25 mg twice daily

## 2020-11-14 NOTE — Assessment & Plan Note (Signed)
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

## 2020-11-14 NOTE — Assessment & Plan Note (Signed)
Iron deficiency anemia  To recheck iron levels and blood counts and increase iron sulfate to twice daily

## 2020-11-15 ENCOUNTER — Telehealth (INDEPENDENT_AMBULATORY_CARE_PROVIDER_SITE_OTHER): Payer: Self-pay

## 2020-11-15 LAB — CBC WITH DIFFERENTIAL/PLATELET
Basophils Absolute: 0.1 10*3/uL (ref 0.0–0.2)
Basos: 1 %
EOS (ABSOLUTE): 0.2 10*3/uL (ref 0.0–0.4)
Eos: 4 %
Hematocrit: 30.1 % — ABNORMAL LOW (ref 37.5–51.0)
Hemoglobin: 8.5 g/dL — ABNORMAL LOW (ref 13.0–17.7)
Immature Grans (Abs): 0 10*3/uL (ref 0.0–0.1)
Immature Granulocytes: 0 %
Lymphocytes Absolute: 1.6 10*3/uL (ref 0.7–3.1)
Lymphs: 35 %
MCH: 20.1 pg — ABNORMAL LOW (ref 26.6–33.0)
MCHC: 28.2 g/dL — ABNORMAL LOW (ref 31.5–35.7)
MCV: 71 fL — ABNORMAL LOW (ref 79–97)
Monocytes Absolute: 0.5 10*3/uL (ref 0.1–0.9)
Monocytes: 11 %
Neutrophils Absolute: 2.2 10*3/uL (ref 1.4–7.0)
Neutrophils: 49 %
Platelets: 599 10*3/uL — ABNORMAL HIGH (ref 150–450)
RBC: 4.23 x10E6/uL (ref 4.14–5.80)
RDW: 18.5 % — ABNORMAL HIGH (ref 11.6–15.4)
WBC: 4.6 10*3/uL (ref 3.4–10.8)

## 2020-11-15 LAB — IRON: Iron: 11 ug/dL — ABNORMAL LOW (ref 38–169)

## 2020-11-15 MED FILL — VALSARTAN-HCTZ 320-12.5 MG: 320-12.5 | 30 days supply | Qty: 30 | Fill #0

## 2020-11-15 NOTE — Progress Notes (Signed)
Let the pt know although the iron level is low his hemoglobin is improved up to 8.5.  Stay on iron supplement and iron rich diet

## 2020-11-15 NOTE — Telephone Encounter (Signed)
-----   Message from Elsie Stain, MD sent at 11/15/2020  7:35 AM EST ----- Let the pt know although the iron level is low his hemoglobin is improved up to 8.5.  Stay on iron supplement and iron rich diet

## 2020-11-15 NOTE — Telephone Encounter (Signed)
Per DPR left message informing patient that although is iron levels are low his hemoglobin has improved to 8.5. advised to continue taking iron supplement and eating an iron rich diet. Call [phone] with any questions or concerns. Nat Christen, CMA

## 2020-11-18 ENCOUNTER — Inpatient Hospital Stay: Payer: Self-pay | Admitting: Licensed Clinical Social Worker

## 2020-11-18 ENCOUNTER — Encounter: Payer: Self-pay | Admitting: Licensed Clinical Social Worker

## 2020-11-18 ENCOUNTER — Encounter: Payer: Self-pay | Admitting: Genetic Counselor

## 2020-11-19 ENCOUNTER — Other Ambulatory Visit: Payer: Self-pay

## 2020-11-19 ENCOUNTER — Inpatient Hospital Stay: Payer: Self-pay | Admitting: Licensed Clinical Social Worker

## 2021-01-14 ENCOUNTER — Ambulatory Visit: Payer: Self-pay | Admitting: Critical Care Medicine

## 2021-01-14 NOTE — Progress Notes (Deleted)
Subjective:    Patient ID: Aaron Roth, male    DOB: April 10, 1946, 75 y.o.   MRN: [id number]  09/24/2020 74 y.o.M f/u from 08/14/20 OV with Dr Wynetta Emery  This patient is a post hospital follow-up and now to establish for primary care.  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  Hemoglobin was down to 3.8 follow-up hemoglobin was up to 8.2 patient still on iron supplementation and has planned surgery to further resect the colon cancer  On arrival patient's blood pressure is 192/101 he has been out of his blood pressure medications on arrival A1c is less than 6 he has received his Materna Covid vaccines in February March of this year  The patient does smoke a pack a day of cigarettes  10/09/2020 Patient seen in return follow-up here for preop clearance for planned laparoscopic surgery for recurrent colon cancer in the cecum.  Patient does have iron deficiency anemia from chronic lower GI bleeding.  On arrival blood pressure is 174/91.  He still smoking 3 cigarettes daily.  He still taking quite a bit of salt for tickly with frequent visits to the restaurants in his area.  The dermatitis on his back has resolved itself and it appeared to been a contact dermatitis from hospital linen.  11/14/2020 Patient was seen in return follow-up and had his recurrent: Skin cancer resected in colon reanastomosed per Dr. Marcello Moores.  This occurred before Christmas.  He is recovering nicely from this.  His discharge hemoglobin was 7.3 preop was 8.3 after 1 unit packed red cell transfusion.  It was hypothesized he had chronic blood loss from the colon that he also appears to have severe iron deficiency anemia with depleted iron stores.  The patient's had follow-up with oncology and they are referring him to nutritionist.  Patient states he has still some fatigue.  He is still smoking 1 to 2 cigarettes a day.  On  arrival blood pressure was 190/117.  It appears he is not taking his blood pressure medicine every day.  He says at times he feels like it is making him dizzy and fatigued.  He has had no bleeding from below that he can tell he is fully healed up in his wounds.  He has not been taking his iron supplement as well.  01/14/2021   Past Medical History:  Diagnosis Date  . Anemia    07-2020  . Cancer ([place]) 07-01-12   colon-surgery and chemotherapy  . 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 Colostomy care [facility]) 07-01-12   09-24-12 post colon resection  . 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 Difficult intravenous access 07-01-12   Has port-a-cath at present, but is to be removed 07-14-12.  . Essential hypertension 08/10/2020  . GERD (gastroesophageal reflux disease)    pt. denies     Family History  Problem Relation Age of Onset  . Bone cancer Brother   . ALS Sister   . Cancer Brother        bone, colon, testicular  . Cancer Father        prostate     Social History   Socioeconomic History  . Marital status: Married    Spouse name: Not on file  . Number of children: Not on file  . Years of education: Not on file  . Highest education level: Not  on file  Occupational History  . Not on file  Tobacco Use  . Smoking status: Current Some Day Smoker    Packs/day: 0.25    Years: 40.00    Pack years: 10.00    Types: Cigarettes  . Smokeless tobacco: Never Used  Vaping Use  . Vaping Use: Never used  Substance and Sexual Activity  . Alcohol use: No  . Drug use: No  . Sexual activity: Not Currently  Other Topics Concern  . Not on file  Social History Narrative  . Not on file   Social Determinants of Health   Financial Resource Strain: Not on file  Food Insecurity: Not on file  Transportation Needs: Not on file  Physical Activity: Not on file  Stress: Not on file  Social Connections: Not on file  Intimate Partner Violence: Not on  file     Allergies  Allergen Reactions  . Other     Pre-breaded shrimp     Outpatient Medications Prior to Visit  Medication Sig Dispense Refill  . amLODipine (NORVASC) 10 MG tablet Take 1 tablet (10 mg total) by mouth daily. 90 tablet 1  . carvedilol (COREG) 6.25 MG tablet Take 1 tablet (6.25 mg total) by mouth 2 (two) times daily with a meal. 60 tablet 3  . ferrous sulfate 325 (65 FE) MG tablet Take 1 tablet (325 mg total) by mouth 2 (two) times daily with a meal. 180 tablet 1  . Nutritional Supplements (IMMUNE ENHANCE) TABS Take 1 tablet by mouth daily.    . valsartan-hydrochlorothiazide (DIOVAN-HCT) 320-12.5 MG tablet Take 1 tablet by mouth daily. 90 tablet 1   No facility-administered medications prior to visit.      Review of Systems  HENT: Negative.   Respiratory: Negative.   Cardiovascular: Negative.   Gastrointestinal: Negative.   Genitourinary: Negative.   Musculoskeletal: Negative.   Skin: Negative for rash.  Neurological: Negative.   Hematological: Negative.   Psychiatric/Behavioral: Negative.        Objective:   Physical Exam  There were no vitals taken for this visit.  Gen: Pleasant, well-nourished, in no distress,  normal affect  ENT: No lesions,  mouth clear,  oropharynx clear, no postnasal drip  Neck: No JVD, no TMG, no carotid bruits  Lungs: No use of accessory muscles, no dullness to percussion, clear without rales or rhonchi  Cardiovascular: RRR, heart sounds normal, no murmur or gallops, no peripheral edema  Abdomen: soft and NT, no HSM,  BS normal  Musculoskeletal: No deformities, no cyanosis or clubbing  Neuro: alert, non focal  Skin: Warm, no lesions or rashes        Assessment & Plan:  I personally reviewed all images and lab data in the [facility] system as well as any outside material available during this office visit and agree with the  radiology impressions.   No problem-specific Assessment & Plan notes found for this  encounter.   There are no diagnoses linked to this encounter.

## 2021-02-17 NOTE — Progress Notes (Signed)
Subjective:    Patient ID: Aaron Roth, male    DOB: [birth year], 75 y.o.   MRN: [id number]  09/24/2020 74 y.o.M f/u from 08/14/20 OV with Dr Wynetta Emery  This patient is a post hospital follow-up and now to establish for primary care.  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  Hemoglobin was down to 3.8 follow-up hemoglobin was up to 8.2 patient still on iron supplementation and has planned surgery to further resect the colon cancer  On arrival patient's blood pressure is 192/101 he has been out of his blood pressure medications on arrival A1c is less than 6 he has received his Materna Covid vaccines in February March of this year  The patient does smoke a pack a day of cigarettes  10/09/2020 Patient seen in return follow-up here for preop clearance for planned laparoscopic surgery for recurrent colon cancer in the cecum.  Patient does have iron deficiency anemia from chronic lower GI bleeding.  On arrival blood pressure is 174/91.  He still smoking 3 cigarettes daily.  He still taking quite a bit of salt for tickly with frequent visits to the restaurants in his area.  The dermatitis on his back has resolved itself and it appeared to been a contact dermatitis from hospital linen.  11/14/2020 Patient was seen in return follow-up and had his recurrent: Skin cancer resected in colon reanastomosed per Dr. Marcello Moores.  This occurred before Christmas.  He is recovering nicely from this.  His discharge hemoglobin was 7.3 preop was 8.3 after 1 unit packed red cell transfusion.  It was hypothesized he had chronic blood loss from the colon that he also appears to have severe iron deficiency anemia with depleted iron stores.  The patient's had follow-up with oncology and they are referring him to nutritionist.  Patient states he has still some fatigue.  He is still smoking 1 to 2 cigarettes a day.  On  arrival blood pressure was 190/117.  It appears he is not taking his blood pressure medicine every day.  He says at times he feels like it is making him dizzy and fatigued.  He has had no bleeding from below that he can tell he is fully healed up in his wounds.  He has not been taking his iron supplement as well.  02/18/21 Patient seen in return follow-up on arrival blood pressure 209/89.  He did not get his valsartan HCT refilled in January as requested.  Repeat blood pressure was 173/89.  Note the patient is asymptomatic.  He still has some loose stools.  Minimal sinus congestion.  He states his blood pressure at home is 149/72.  No other complaints at this visit    Past Medical History:  Diagnosis Date  . Anemia    07-2020  . Cancer ([place]) 07-01-12   colon-surgery and chemotherapy  . Colon cancer, ascending ([place]) 10/17/2020  . Colostomy care [facility]) 07-01-12   09-24-12 post colon resection  . 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 Difficult intravenous access 07-01-12   Has port-a-cath at present, but is to be removed 07-14-12.  . Essential hypertension 08/10/2020  . GERD (gastroesophageal reflux disease)    pt. denies     Family History  Problem Relation Age of Onset  . Bone cancer Brother   . ALS Sister   . Cancer Brother  bone, colon, testicular  . Cancer Father        prostate     Social History   Socioeconomic History  . Marital status: Married    Spouse name: Not on file  . Number of children: Not on file  . Years of education: Not on file  . Highest education level: Not on file  Occupational History  . Not on file  Tobacco Use  . Smoking status: Current Some Day Smoker    Packs/day: 0.25    Years: 40.00    Pack years: 10.00    Types: Cigarettes  . Smokeless tobacco: Never Used  Vaping Use  . Vaping Use: Never used  Substance and Sexual Activity  . Alcohol use: No  . Drug use: No  . Sexual activity: Not  Currently  Other Topics Concern  . Not on file  Social History Narrative  . Not on file   Social Determinants of Health   Financial Resource Strain: Not on file  Food Insecurity: Not on file  Transportation Needs: Not on file  Physical Activity: Not on file  Stress: Not on file  Social Connections: Not on file  Intimate Partner Violence: Not on file     Allergies  Allergen Reactions  . Other     Pre-breaded shrimp     Outpatient Medications Prior to Visit  Medication Sig Dispense Refill  . Nutritional Supplements (IMMUNE ENHANCE) TABS Take 1 tablet by mouth daily.    Marland Kitchen amLODipine (NORVASC) 10 MG tablet TAKE 1 TABLET (10 MG TOTAL) BY MOUTH DAILY. 90 tablet 1  . carvedilol (COREG) 6.25 MG tablet TAKE 1 TABLET (6.25 MG TOTAL) BY MOUTH 2 (TWO) TIMES DAILY WITH A MEAL. 60 tablet 3  . ferrous sulfate 325 (65 FE) MG tablet TAKE 1 TABLET (325 MG TOTAL) BY MOUTH 2 (TWO) TIMES DAILY WITH A MEAL. 180 tablet 1  . valsartan-hydrochlorothiazide (DIOVAN-HCT) 320-12.5 MG tablet TAKE 1 TABLET BY MOUTH DAILY. (Patient not taking: Reported on 02/18/2021) 90 tablet 1   No facility-administered medications prior to visit.      Review of Systems  HENT: Negative.   Eyes: Positive for visual disturbance.  Respiratory: Negative.   Cardiovascular: Negative.   Gastrointestinal: Positive for diarrhea.  Genitourinary: Negative.   Musculoskeletal: Negative.   Skin: Negative for rash.  Neurological: Negative.   Hematological: Negative.   Psychiatric/Behavioral: Negative.        Objective:   Physical Exam  BP (!) 173/83   Pulse 72   Resp 17   Ht 5\' 7"  (1.702 m)   Wt 178 lb 3.2 oz (80.8 kg)   SpO2 96%   BMI 27.91 kg/m   Gen: Pleasant, well-nourished, in no distress,  normal affect  ENT: No lesions,  mouth clear,  oropharynx clear, no postnasal drip  Neck: No JVD, no TMG, no carotid bruits  Lungs: No use of accessory muscles, no dullness to percussion, clear without rales or  rhonchi  Cardiovascular: RRR, heart sounds normal, no murmur or gallops, no peripheral edema  Abdomen: soft and NT, no HSM,  BS normal  Musculoskeletal: No deformities, no cyanosis or clubbing  Neuro: alert, non focal  Skin: Warm, no lesions or rashes        Assessment & Plan:  I personally reviewed all images and lab data in the [facility] system as well as any outside material available during this office visit and agree with the  radiology impressions.   Former tobacco use Not currently smoking  Essential hypertension Not controlled plan to begin valsartan HCT and continue Coreg and amlodipine  Colon cancer, ascending (HCC) S/p resection  Iron deficiency anemia due to chronic blood loss Repeat iron deficiency studies   Kashius was seen today for 2 month follow up .  Diagnoses and all orders for this visit:  Iron deficiency anemia due to chronic blood loss -     CBC with Differential/Platelet -     Iron, TIBC and Ferritin Panel  Essential hypertension -     Comprehensive metabolic panel  Former tobacco use  Colon cancer, ascending (HCC)  Other orders -     valsartan-hydrochlorothiazide (DIOVAN-HCT) 320-12.5 MG tablet; Take 1 tablet by mouth daily. -     carvedilol (COREG) 6.25 MG tablet; TAKE 1 TABLET (6.25 MG TOTAL) BY MOUTH 2 (TWO) TIMES DAILY WITH A MEAL. -     amLODipine (NORVASC) 10 MG tablet; TAKE 1 TABLET (10 MG TOTAL) BY MOUTH DAILY. -     ferrous sulfate 325 (65 FE) MG tablet; TAKE 1 TABLET (325 MG TOTAL) BY MOUTH 2 (TWO) TIMES DAILY WITH A MEAL.

## 2021-02-18 ENCOUNTER — Other Ambulatory Visit: Payer: Self-pay

## 2021-02-18 ENCOUNTER — Encounter: Payer: Self-pay | Admitting: Critical Care Medicine

## 2021-02-18 ENCOUNTER — Ambulatory Visit: Payer: Self-pay | Attending: Critical Care Medicine | Admitting: Critical Care Medicine

## 2021-02-18 VITALS — BP 173/83 | HR 72 | Resp 17 | Ht 67.0 in | Wt 178.2 lb

## 2021-02-18 DIAGNOSIS — Z7901 Long term (current) use of anticoagulants: Secondary | ICD-10-CM | POA: Insufficient documentation

## 2021-02-18 DIAGNOSIS — Z85038 Personal history of other malignant neoplasm of large intestine: Secondary | ICD-10-CM | POA: Insufficient documentation

## 2021-02-18 DIAGNOSIS — I10 Essential (primary) hypertension: Secondary | ICD-10-CM

## 2021-02-18 DIAGNOSIS — C182 Malignant neoplasm of ascending colon: Secondary | ICD-10-CM

## 2021-02-18 DIAGNOSIS — D500 Iron deficiency anemia secondary to blood loss (chronic): Secondary | ICD-10-CM

## 2021-02-18 DIAGNOSIS — Z79899 Other long term (current) drug therapy: Secondary | ICD-10-CM | POA: Insufficient documentation

## 2021-02-18 DIAGNOSIS — R0981 Nasal congestion: Secondary | ICD-10-CM | POA: Insufficient documentation

## 2021-02-18 DIAGNOSIS — Z87891 Personal history of nicotine dependence: Secondary | ICD-10-CM

## 2021-02-18 DIAGNOSIS — R197 Diarrhea, unspecified: Secondary | ICD-10-CM | POA: Insufficient documentation

## 2021-02-18 MED ORDER — CARVEDILOL 6.25 MG PO TABS
ORAL_TABLET | Freq: Two times a day (BID) | ORAL | 3 refills | Status: DC
  Filled 2021-02-18 – 2021-02-27 (×2): qty 60, 30d supply, fill #0

## 2021-02-18 MED ORDER — VALSARTAN-HYDROCHLOROTHIAZIDE 320-12.5 MG PO TABS
1.0000 | ORAL_TABLET | Freq: Every day | ORAL | 1 refills | Status: DC
  Filled 2021-02-18 – 2021-02-27 (×2): qty 30, 30d supply, fill #0

## 2021-02-18 MED ORDER — FERROUS SULFATE 325 (65 FE) MG PO TABS
ORAL_TABLET | ORAL | 1 refills | Status: DC
  Filled 2021-02-18 – 2021-02-27 (×2): qty 60, 30d supply, fill #0

## 2021-02-18 MED ORDER — AMLODIPINE BESYLATE 10 MG PO TABS
ORAL_TABLET | Freq: Every day | ORAL | 1 refills | Status: DC
  Filled 2021-02-18 – 2021-02-27 (×2): qty 30, 30d supply, fill #0

## 2021-02-18 NOTE — Assessment & Plan Note (Signed)
Not controlled plan to begin valsartan HCT and continue Coreg and amlodipine

## 2021-02-18 NOTE — Assessment & Plan Note (Signed)
Not currently smoking

## 2021-02-18 NOTE — Assessment & Plan Note (Signed)
S/p resection 

## 2021-02-18 NOTE — Patient Instructions (Signed)
All your medications went to our pharmacy please ensure that you are picked up and you start the valsartan HCT for blood pressure  Labs today including metabolic panel iron studies blood counts  He declined the tetanus shot at this visit we will reconsider at a future visit  Return to see Dr. Joya Gaskins in 4 months

## 2021-02-18 NOTE — Assessment & Plan Note (Signed)
Repeat iron deficiency studies

## 2021-02-19 ENCOUNTER — Other Ambulatory Visit: Payer: Self-pay

## 2021-02-19 LAB — CBC WITH DIFFERENTIAL/PLATELET
Basophils Absolute: 0.1 10*3/uL (ref 0.0–0.2)
Basos: 1 %
EOS (ABSOLUTE): 0.1 10*3/uL (ref 0.0–0.4)
Eos: 3 %
Hematocrit: 42.7 % (ref 37.5–51.0)
Hemoglobin: 13.4 g/dL (ref 13.0–17.7)
Immature Grans (Abs): 0 10*3/uL (ref 0.0–0.1)
Immature Granulocytes: 0 %
Lymphocytes Absolute: 2 10*3/uL (ref 0.7–3.1)
Lymphs: 41 %
MCH: 24.5 pg — ABNORMAL LOW (ref 26.6–33.0)
MCHC: 31.4 g/dL — ABNORMAL LOW (ref 31.5–35.7)
MCV: 78 fL — ABNORMAL LOW (ref 79–97)
Monocytes Absolute: 0.5 10*3/uL (ref 0.1–0.9)
Monocytes: 11 %
Neutrophils Absolute: 2.1 10*3/uL (ref 1.4–7.0)
Neutrophils: 44 %
Platelets: 388 10*3/uL (ref 150–450)
RBC: 5.47 x10E6/uL (ref 4.14–5.80)
RDW: 19.6 % — ABNORMAL HIGH (ref 11.6–15.4)
WBC: 4.8 10*3/uL (ref 3.4–10.8)

## 2021-02-19 LAB — IRON,TIBC AND FERRITIN PANEL
Ferritin: 17 ng/mL — ABNORMAL LOW (ref 30–400)
Iron Saturation: 48 % (ref 15–55)
Iron: 190 ug/dL — ABNORMAL HIGH (ref 38–169)
Total Iron Binding Capacity: 398 ug/dL (ref 250–450)
UIBC: 208 ug/dL (ref 111–343)

## 2021-02-19 LAB — COMPREHENSIVE METABOLIC PANEL
ALT: 7 IU/L (ref 0–44)
AST: 10 IU/L (ref 0–40)
Albumin/Globulin Ratio: 1.5 (ref 1.2–2.2)
Albumin: 4.4 g/dL (ref 3.7–4.7)
Alkaline Phosphatase: 86 IU/L (ref 44–121)
BUN/Creatinine Ratio: 11 (ref 10–24)
BUN: 14 mg/dL (ref 8–27)
Bilirubin Total: <0.2 mg/dL (ref 0.0–1.2)
CO2: 25 mmol/L (ref 20–29)
Calcium: 9.4 mg/dL (ref 8.6–10.2)
Chloride: 102 mmol/L (ref 96–106)
Creatinine, Ser: 1.22 mg/dL (ref 0.76–1.27)
Globulin, Total: 2.9 g/dL (ref 1.5–4.5)
Glucose: 64 mg/dL — ABNORMAL LOW (ref 65–99)
Potassium: 4.5 mmol/L (ref 3.5–5.2)
Sodium: 140 mmol/L (ref 134–144)
Total Protein: 7.3 g/dL (ref 6.0–8.5)
eGFR: 61 mL/min/{1.73_m2} (ref >59)

## 2021-02-20 ENCOUNTER — Telehealth: Payer: Self-pay

## 2021-02-20 NOTE — Telephone Encounter (Signed)
Attempted to call pt no answer left VM to return call.

## 2021-02-20 NOTE — Telephone Encounter (Signed)
-----   Message from Elsie Stain, MD sent at 02/19/2021  2:46 PM EDT ----- I tried to reach the patient, no answer,  tell him blood counts now normal, he can stop iron because his iron levels are now normal.  Liver and kidney normal

## 2021-02-20 NOTE — Telephone Encounter (Signed)
Pt informed of lab results verbalized understanding no other concerns voiced.

## 2021-02-26 ENCOUNTER — Other Ambulatory Visit: Payer: Self-pay

## 2021-02-27 ENCOUNTER — Other Ambulatory Visit: Payer: Self-pay

## 2021-05-08 ENCOUNTER — Inpatient Hospital Stay: Payer: Self-pay

## 2021-05-08 ENCOUNTER — Telehealth: Payer: Self-pay

## 2021-05-08 ENCOUNTER — Inpatient Hospital Stay: Payer: Self-pay | Admitting: Oncology

## 2021-05-08 ENCOUNTER — Other Ambulatory Visit: Payer: Self-pay

## 2021-05-08 NOTE — Telephone Encounter (Signed)
TC to Pt to inquire about missed appointment. Pt stated he did not remember having an appointment with Dr. Benay Spice informed Pt that I will have scheduling call him to reschedule the appointment. Pt verbalized understanding. Scheduling message left.

## 2021-05-09 ENCOUNTER — Telehealth: Payer: Self-pay | Admitting: Oncology

## 2021-05-09 NOTE — Telephone Encounter (Signed)
I called and left a detailed message for patient regarding his appointments that have been rescheduled per 7/7 sch msg

## 2021-05-19 ENCOUNTER — Telehealth: Payer: Self-pay | Admitting: *Deleted

## 2021-05-19 ENCOUNTER — Inpatient Hospital Stay: Payer: Self-pay | Attending: Critical Care Medicine

## 2021-05-19 ENCOUNTER — Inpatient Hospital Stay: Payer: Self-pay | Admitting: Oncology

## 2021-05-19 NOTE — Telephone Encounter (Signed)
Patient was "no show" for 6 month f/u with labs. Left VM requesting return call to reschedule or to discuss how he wishes his oncology follow up.

## 2021-05-27 ENCOUNTER — Telehealth: Payer: Self-pay | Admitting: *Deleted

## 2021-05-27 NOTE — Telephone Encounter (Signed)
Called patient re: missed F/U appointment. Rescheduled with him for 06/17/21 at 1000/1020.

## 2021-06-17 ENCOUNTER — Inpatient Hospital Stay: Payer: Self-pay | Attending: Critical Care Medicine | Admitting: Oncology

## 2021-06-17 ENCOUNTER — Inpatient Hospital Stay: Payer: Self-pay

## 2021-06-24 ENCOUNTER — Telehealth: Payer: Self-pay | Admitting: *Deleted

## 2021-06-24 ENCOUNTER — Ambulatory Visit: Payer: Self-pay | Admitting: Critical Care Medicine

## 2021-06-24 NOTE — Telephone Encounter (Signed)
Left VM for patient that he missed a 2nd appointment with Dr. Benay Spice on 8/16. Provided office [phone] for him to call if he wishes to reschedule.

## 2021-07-02 ENCOUNTER — Ambulatory Visit: Payer: Self-pay | Admitting: Critical Care Medicine

## 2021-07-02 NOTE — Progress Notes (Deleted)
Established Patient Office Visit  Subjective:  Patient ID: Aaron Roth, male    DOB: 1946-06-02  Age: 75 y.o. MRN: [id number]  CC: No chief complaint on file.   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   Essential hypertension 08/10/2020   GERD (gastroesophageal reflux disease)    pt. denies    Past Surgical History:  Procedure Laterality Date   BIOPSY  08/04/2020   Procedure: BIOPSY;  Surgeon: [name], MD;  Location: [facility];  Service: Endoscopy;;  EGD and COLON   COLONOSCOPY  06/03/2012   Procedure: COLONOSCOPY;  Surgeon: [name], MD;  Location: [facility];  Service: General;  Laterality: N/A;   COLONOSCOPY WITH PROPOFOL N/A 08/04/2020   Procedure: COLONOSCOPY WITH PROPOFOL;  Surgeon: [name], MD;  [facility]: [facility];  Service: Endoscopy;  Laterality: N/A;   COLOSTOMY  09/25/2011   Procedure: COLOSTOMY;  Surgeon: [name], MD;  [facility]: [facility];  Service: General;  Laterality: N/A;   COLOSTOMY TAKEDOWN  07/14/2012   Procedure: LAPAROSCOPIC COLOSTOMY TAKEDOWN;  Surgeon: [name], MD;  [facility]: [facility];  Service: General;  Laterality: N/A;  laparoscopic coverted to open takedown of colostomy   ESOPHAGOGASTRODUODENOSCOPY (EGD) WITH PROPOFOL N/A 08/04/2020   Procedure: ESOPHAGOGASTRODUODENOSCOPY (EGD) WITH PROPOFOL;  Surgeon: [name], MD;  [facility];  Service: Endoscopy;  Laterality: N/A;   GANGLION CYST EXCISION     wrist and foot   LAPAROSCOPIC PARTIAL COLECTOMY N/A 10/17/2020   Procedure: LAPAROSCOPIC RIGHT COLECTOMY;   Surgeon: [name], MD;  [facility]: [facility];  Service: General;  Laterality: N/A;   PARTIAL COLECTOMY  09/25/2011   Procedure: PARTIAL COLECTOMY;  Surgeon: [name], MD;  [facility];  Service: General;;   POLYPECTOMY  08/04/2020   Procedure: POLYPECTOMY;  Surgeon: [name], MD;  [facility];  Service: Endoscopy;;   PORT-A-CATH REMOVAL  07/14/2012   Procedure: REMOVAL PORT-A-CATH;  Surgeon: [name], MD;  [facility];  Service: General;  Laterality: N/A;   PORTACATH PLACEMENT  11/10/2011   Procedure: INSERTION PORT-A-CATH;  Surgeon: [name], MD;  Location: [place];  Service: General;  Laterality: Left;  left subclavian   PORTACATH PLACEMENT  11/10/2011    Family History  Problem Relation Age of Onset   Bone cancer Brother    ALS Sister    Cancer Brother        bone, colon, testicular   Cancer Father        prostate    Social History   Socioeconomic History   Marital status: Married    Spouse name: Not on file   Number of children: Not on file   Years of education: Not on file   Highest education level: Not on file  Occupational History   Not on file  Tobacco Use   Smoking status: Former    Packs/day: 0.25    Years: 40.00    Pack years: 10.00    Types: Cigarettes    Quit date: 11/20/2020  Years since quitting: 0.6   Smokeless tobacco: Never  Vaping Use   Vaping Use: Never used  Substance and Sexual Activity   Alcohol use: No   Drug use: No   Sexual activity: Not Currently  Other Topics Concern   Not on file  Social History Narrative   Not on file   Social Determinants of Health   Financial Resource Strain: Not on file  Food Insecurity: Not on file  Transportation Needs: Not on file  Physical Activity: Not on file  Stress: Not on file  Social Connections: Not on file  Intimate Partner Violence: Not on file    Outpatient Medications Prior to Visit  Medication Sig Dispense Refill   amLODipine  (NORVASC) 10 MG tablet TAKE 1 TABLET (10 MG TOTAL) BY MOUTH DAILY. 90 tablet 1   carvedilol (COREG) 6.25 MG tablet TAKE 1 TABLET (6.25 MG TOTAL) BY MOUTH 2 (TWO) TIMES DAILY WITH A MEAL. 60 tablet 3   ferrous sulfate 325 (65 FE) MG tablet TAKE 1 TABLET (325 MG TOTAL) BY MOUTH 2 (TWO) TIMES DAILY WITH A MEAL. 180 tablet 1   Nutritional Supplements (IMMUNE ENHANCE) TABS Take 1 tablet by mouth daily.     valsartan-hydrochlorothiazide (DIOVAN-HCT) 320-12.5 MG tablet Take 1 tablet by mouth daily. 90 tablet 1   No facility-administered medications prior to visit.    Allergies  Allergen Reactions   Other     Pre-breaded shrimp    ROS Review of Systems    Objective:    Physical Exam  There were no vitals taken for this visit. Wt Readings from Last 3 Encounters:  02/18/21 178 lb 3.2 oz (80.8 kg)  11/14/20 171 lb (77.6 kg)  11/06/20 165 lb (74.8 kg)     Health Maintenance Due  Topic Date Due   TETANUS/TDAP  Never done   Zoster Vaccines- Shingrix (1 of 2) Never done   COVID-19 Vaccine (4 - Booster for Moderna series) 12/31/2020   INFLUENZA VACCINE  06/02/2021    There are no preventive care reminders to display for this patient.  No results found for: TSH Lab Results  Component Value Date   WBC 4.8 02/18/2021   HGB 13.4 02/18/2021   HCT 42.7 02/18/2021   MCV 78 (L) 02/18/2021   PLT 388 02/18/2021   Lab Results  Component Value Date   NA 140 02/18/2021   K 4.5 02/18/2021   CO2 25 02/18/2021   GLUCOSE 64 (L) 02/18/2021   BUN 14 02/18/2021   CREATININE 1.22 02/18/2021   BILITOT <0.2 02/18/2021   ALKPHOS 86 02/18/2021   AST 10 02/18/2021   ALT 7 02/18/2021   PROT 7.3 02/18/2021   ALBUMIN 4.4 02/18/2021   CALCIUM 9.4 02/18/2021   ANIONGAP 9 10/19/2020   EGFR 61 02/18/2021   No results found for: CHOL No results found for: HDL No results found for: LDLCALC No results found for: TRIG No results found for: CHOLHDL No results found for: HGBA1C    Assessment &  Plan:   Problem List Items Addressed This Visit   None   No orders of the defined types were placed in this encounter.   Follow-up: No follow-ups on file.    [name], MD

## 2021-08-31 NOTE — Progress Notes (Deleted)
Established Patient Office Visit  Subjective:  Patient ID: Aaron Roth, male    DOB: 1946-06-02  Age: 75 y.o. MRN: [id number]  CC: No chief complaint on file.   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   Essential hypertension 08/10/2020   GERD (gastroesophageal reflux disease)    pt. denies    Past Surgical History:  Procedure Laterality Date   BIOPSY  08/04/2020   Procedure: BIOPSY;  Surgeon: [name], MD;  [facility];  Service: Endoscopy;;  EGD and COLON   COLONOSCOPY  06/03/2012   Procedure: COLONOSCOPY;  Surgeon: [name], MD;  Location: [facility];  Service: General;  Laterality: N/A;   COLONOSCOPY WITH PROPOFOL N/A 08/04/2020   Procedure: COLONOSCOPY WITH PROPOFOL;  Surgeon: [name], MD;  [facility];  Service: Endoscopy;  Laterality: N/A;   COLOSTOMY  09/25/2011   Procedure: COLOSTOMY;  Surgeon: [name], MD;  [facility];  Service: General;  Laterality: N/A;   COLOSTOMY TAKEDOWN  07/14/2012   Procedure: LAPAROSCOPIC COLOSTOMY TAKEDOWN;  Surgeon: [name], MD;  [facility];  Service: General;  Laterality: N/A;  laparoscopic coverted to open takedown of colostomy   ESOPHAGOGASTRODUODENOSCOPY (EGD) WITH PROPOFOL N/A 08/04/2020   Procedure: ESOPHAGOGASTRODUODENOSCOPY (EGD) WITH PROPOFOL;  Surgeon: [name], MD;  [facility];  Service: Endoscopy;  Laterality: N/A;   GANGLION CYST EXCISION     wrist and foot   LAPAROSCOPIC PARTIAL COLECTOMY N/A 10/17/2020   Procedure: LAPAROSCOPIC RIGHT COLECTOMY;   Surgeon: [name], MD;  [facility];  Service: General;  Laterality: N/A;   PARTIAL COLECTOMY  09/25/2011   Procedure: PARTIAL COLECTOMY;  Surgeon: [name], MD;  [facility];  Service: General;;   POLYPECTOMY  08/04/2020   Procedure: POLYPECTOMY;  Surgeon: [name], MD;  [facility];  Service: Endoscopy;;   PORT-A-CATH REMOVAL  07/14/2012   Procedure: REMOVAL PORT-A-CATH;  Surgeon: [name], MD;  [facility];  Service: General;  Laterality: N/A;   PORTACATH PLACEMENT  11/10/2011   Procedure: INSERTION PORT-A-CATH;  Surgeon: [name], MD;  Location: [place];  Service: General;  Laterality: Left;  left subclavian   PORTACATH PLACEMENT  11/10/2011    Family History  Problem Relation Age of Onset   Bone cancer Brother    ALS Sister    Cancer Brother        bone, colon, testicular   Cancer Father        prostate    Social History   Socioeconomic History   Marital status: Married    Spouse name: Not on file   Number of children: Not on file   Years of education: Not on file   Highest education level: Not on file  Occupational History   Not on file  Tobacco Use   Smoking status: Former    Packs/day: 0.25    Years: 40.00    Pack years: 10.00    Types: Cigarettes    Quit date: 11/20/2020  Years since quitting: 0.7   Smokeless tobacco: Never  Vaping Use   Vaping Use: Never used  Substance and Sexual Activity   Alcohol use: No   Drug use: No   Sexual activity: Not Currently  Other Topics Concern   Not on file  Social History Narrative   Not on file   Social Determinants of Health   Financial Resource Strain: Not on file  Food Insecurity: Not on file  Transportation Needs: Not on file  Physical Activity: Not on file  Stress: Not on file  Social Connections: Not on file  Intimate Partner Violence: Not on file    Outpatient Medications Prior to Visit  Medication Sig Dispense Refill   amLODipine  (NORVASC) 10 MG tablet TAKE 1 TABLET (10 MG TOTAL) BY MOUTH DAILY. 90 tablet 1   carvedilol (COREG) 6.25 MG tablet TAKE 1 TABLET (6.25 MG TOTAL) BY MOUTH 2 (TWO) TIMES DAILY WITH A MEAL. 60 tablet 3   ferrous sulfate 325 (65 FE) MG tablet TAKE 1 TABLET (325 MG TOTAL) BY MOUTH 2 (TWO) TIMES DAILY WITH A MEAL. 180 tablet 1   Nutritional Supplements (IMMUNE ENHANCE) TABS Take 1 tablet by mouth daily.     valsartan-hydrochlorothiazide (DIOVAN-HCT) 320-12.5 MG tablet Take 1 tablet by mouth daily. 90 tablet 1   No facility-administered medications prior to visit.    Allergies  Allergen Reactions   Other     Pre-breaded shrimp    ROS Review of Systems    Objective:    Physical Exam  There were no vitals taken for this visit. Wt Readings from Last 3 Encounters:  02/18/21 178 lb 3.2 oz (80.8 kg)  11/14/20 171 lb (77.6 kg)  11/06/20 165 lb (74.8 kg)     Health Maintenance Due  Topic Date Due   Pneumonia Vaccine 36+ Years old (1 - PCV) Never done   TETANUS/TDAP  Never done   Zoster Vaccines- Shingrix (1 of 2) Never done   COVID-19 Vaccine (4 - Booster for Moderna series) 11/27/2020   INFLUENZA VACCINE  Never done    There are no preventive care reminders to display for this patient.  No results found for: TSH Lab Results  Component Value Date   WBC 4.8 02/18/2021   HGB 13.4 02/18/2021   HCT 42.7 02/18/2021   MCV 78 (L) 02/18/2021   PLT 388 02/18/2021   Lab Results  Component Value Date   NA 140 02/18/2021   K 4.5 02/18/2021   CO2 25 02/18/2021   GLUCOSE 64 (L) 02/18/2021   BUN 14 02/18/2021   CREATININE 1.22 02/18/2021   BILITOT <0.2 02/18/2021   ALKPHOS 86 02/18/2021   AST 10 02/18/2021   ALT 7 02/18/2021   PROT 7.3 02/18/2021   ALBUMIN 4.4 02/18/2021   CALCIUM 9.4 02/18/2021   ANIONGAP 9 10/19/2020   EGFR 61 02/18/2021   No results found for: CHOL No results found for: HDL No results found for: LDLCALC No results found for: TRIG No results found for:  CHOLHDL No results found for: HGBA1C    Assessment & Plan:   Problem List Items Addressed This Visit   None   No orders of the defined types were placed in this encounter.   Follow-up: No follow-ups on file.    [name], MD

## 2021-09-01 ENCOUNTER — Ambulatory Visit: Payer: Self-pay | Admitting: Critical Care Medicine

## 2021-10-16 ENCOUNTER — Other Ambulatory Visit: Payer: Self-pay

## 2021-10-16 ENCOUNTER — Encounter: Payer: Self-pay | Admitting: Critical Care Medicine

## 2021-10-16 ENCOUNTER — Ambulatory Visit: Payer: Self-pay | Attending: Critical Care Medicine | Admitting: Critical Care Medicine

## 2021-10-16 VITALS — BP 214/109 | HR 77 | Resp 16 | Wt 182.8 lb

## 2021-10-16 DIAGNOSIS — I10 Essential (primary) hypertension: Secondary | ICD-10-CM

## 2021-10-16 DIAGNOSIS — H269 Unspecified cataract: Secondary | ICD-10-CM | POA: Insufficient documentation

## 2021-10-16 DIAGNOSIS — K029 Dental caries, unspecified: Secondary | ICD-10-CM

## 2021-10-16 DIAGNOSIS — H259 Unspecified age-related cataract: Secondary | ICD-10-CM

## 2021-10-16 DIAGNOSIS — C182 Malignant neoplasm of ascending colon: Secondary | ICD-10-CM

## 2021-10-16 DIAGNOSIS — Z2821 Immunization not carried out because of patient refusal: Secondary | ICD-10-CM

## 2021-10-16 DIAGNOSIS — D500 Iron deficiency anemia secondary to blood loss (chronic): Secondary | ICD-10-CM

## 2021-10-16 DIAGNOSIS — Z720 Tobacco use: Secondary | ICD-10-CM

## 2021-10-16 MED ORDER — AMLODIPINE BESYLATE 10 MG PO TABS
ORAL_TABLET | Freq: Every day | ORAL | 1 refills | Status: DC
  Filled 2021-10-16: qty 30, 30d supply, fill #0

## 2021-10-16 MED ORDER — VALSARTAN-HYDROCHLOROTHIAZIDE 320-25 MG PO TABS
1.0000 | ORAL_TABLET | Freq: Every day | ORAL | 3 refills | Status: DC
  Filled 2021-10-16: qty 30, 30d supply, fill #0

## 2021-10-16 MED ORDER — CARVEDILOL 25 MG PO TABS
25.0000 mg | ORAL_TABLET | Freq: Two times a day (BID) | ORAL | 1 refills | Status: DC
  Filled 2021-10-16: qty 60, 30d supply, fill #0

## 2021-10-16 MED ORDER — FERROUS SULFATE 325 (65 FE) MG PO TABS
ORAL_TABLET | ORAL | 1 refills | Status: DC
Start: 2021-10-16 — End: 2023-07-20
  Filled 2021-10-16: qty 60, 30d supply, fill #0

## 2021-10-16 NOTE — Assessment & Plan Note (Signed)
Colon cancer of the ascending colon status postresection previously  Patient needs postoperative visit with oncology this will be rescheduled

## 2021-10-16 NOTE — Assessment & Plan Note (Signed)
Patient with bilateral cataracts  I encouraged the patient to get his Medicare started as he is past due  Then he could see ophthalmology for cataract extractions

## 2021-10-16 NOTE — Assessment & Plan Note (Signed)
Recommended for patient to continue iron supplement and will recheck iron levels and CBC

## 2021-10-16 NOTE — Assessment & Plan Note (Signed)
Significant dental caries  Patient asked to make appointment with local dentist for extractions

## 2021-10-16 NOTE — Assessment & Plan Note (Signed)
Patient continues to refuse pneumococcal vaccination

## 2021-10-16 NOTE — Assessment & Plan Note (Signed)
Patient continues to refuse influenza vaccination

## 2021-10-16 NOTE — Assessment & Plan Note (Signed)
Blood pressure not well controlled due to the fact he is not taking his medications currently  Plan to renew valsartan HCT and increase to 320/25 daily  Patient to renew amlodipine 10 mg daily  Will renew carvedilol and increased dose 25 mg twice daily  Basic metabolic panel be obtained at this visit  Patient instructed as to a healthy diet

## 2021-10-16 NOTE — Assessment & Plan Note (Signed)
° ° °•   Current smoking consumption amount: 5 to 10 cigarettes a day   Dicsussion on advise to quit smoking and smoking impacts: Oncologic lung cardiovascular impacts   Patient's willingness to quit: Not sure he is willing to quit   Methods to quit smoking discussed: Behavioral modification nicotine replacement   Medication management of smoking session drugs discussed: Nicotine replacement   Resources provided:  AVS    Setting quit date not established  Follow-up arranged 2 months  Time spent counseling the patient: 5 minutes

## 2021-10-16 NOTE — Patient Instructions (Addendum)
Resume Valsartan HCT , carvedilol, amlodipine for blood pressure  Stay on one iron pill daily  Labs today metabolic panel, blood counts, iron levels  Call Dr Bernette Redbird office for appointment, we will also send referral again  You declined flu vaccine ,  you are considering a pneumonia vaccine but undecided today  Stop smoking see attachment  Return Dr Joya Gaskins 1 month for blood pressure check

## 2021-10-16 NOTE — Progress Notes (Signed)
Established Patient Office Visit  Subjective:  Patient ID: Aaron Roth, male    DOB: [birth year]  Age: 75 y.o. MRN: [id number]  CC:  Chief Complaint  Patient presents with   Hypertension    HPI [name] presents for primary care follow-up.  This patient's not been seen in clinic since April of this year.  On arrival blood pressure was quite elevated greater than 200/100.  Patient's been off his blood pressure medications for several months.  He returned to smoking about 5 to 10 cigarettes daily.  Note he previously has declined all vaccinations and today also declines a flu shot tetanus shot and pneumonia vaccine.  Patient had colon cancer recurrence and underwent another resection of same.  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   Essential hypertension 08/10/2020   GERD (gastroesophageal reflux disease)    pt. denies    Past Surgical History:  Procedure Laterality Date   BIOPSY  08/04/2020   Procedure: BIOPSY;  Surgeon: [name], MD;  Location: [facility];  Service: Endoscopy;;  EGD and COLON   COLONOSCOPY  06/03/2012   Procedure: COLONOSCOPY;  Surgeon: [name], MD;  Location: [facility];  Service: General;  Laterality: N/A;   COLONOSCOPY WITH PROPOFOL  N/A 08/04/2020   Procedure: COLONOSCOPY WITH PROPOFOL;  Surgeon: [name], MD;  Location: [facility];  Service: Endoscopy;  Laterality: N/A;   COLOSTOMY  09/25/2011   Procedure: COLOSTOMY;  Surgeon: [name], MD;  Location: [facility];  Service: General;  Laterality: N/A;   COLOSTOMY TAKEDOWN  07/14/2012   Procedure: LAPAROSCOPIC COLOSTOMY TAKEDOWN;  Surgeon: [name], MD;  Location: [facility];  Service: General;  Laterality: N/A;  laparoscopic coverted to open takedown of colostomy   ESOPHAGOGASTRODUODENOSCOPY (EGD) WITH PROPOFOL N/A 08/04/2020   Procedure: ESOPHAGOGASTRODUODENOSCOPY (EGD) WITH PROPOFOL;  Surgeon: [name], MD;  Location: [facility];  Service: Endoscopy;  Laterality: N/A;   GANGLION CYST EXCISION     wrist and foot   LAPAROSCOPIC PARTIAL COLECTOMY N/A 10/17/2020   Procedure: LAPAROSCOPIC RIGHT COLECTOMY;  Surgeon: [name], MD;  Location: [facility];  Service: General;  Laterality: N/A;   PARTIAL COLECTOMY  09/25/2011   Procedure: PARTIAL COLECTOMY;  Surgeon: [name], MD;  Location: [facility];  Service: General;;   POLYPECTOMY  08/04/2020   Procedure: POLYPECTOMY;  Surgeon: [name], MD;  Location: [facility];  Service: Endoscopy;;   PORT-A-CATH REMOVAL  07/14/2012   Procedure: REMOVAL PORT-A-CATH;  Surgeon: [name], MD;  Location: [facility];  Service: General;  Laterality: N/A;   PORTACATH PLACEMENT  11/10/2011   Procedure: INSERTION PORT-A-CATH;  Surgeon: [name], MD;  Location: [facility];  Service: General;  Laterality: Left;  left subclavian  PORTACATH PLACEMENT  11/10/2011    Family History  Problem Relation Age of Onset   Bone cancer Brother    ALS Sister    Cancer Brother        bone, colon, testicular   Cancer Father        prostate    Social History   Socioeconomic History   Marital status: Married    Spouse name: Not on file   Number of children: Not on file   Years of education: Not on file    Highest education level: Not on file  Occupational History   Not on file  Tobacco Use   Smoking status: Every Day    Packs/day: 0.25    Years: 40.00    Pack years: 10.00    Types: Cigarettes   Smokeless tobacco: Never  Vaping Use   Vaping Use: Never used  Substance and Sexual Activity   Alcohol use: No   Drug use: No   Sexual activity: Not Currently  Other Topics Concern   Not on file  Social History Narrative   Not on file   Social Determinants of Health   Financial Resource Strain: Not on file  Food Insecurity: Not on file  Transportation Needs: Not on file  Physical Activity: Not on file  Stress: Not on file  Social Connections: Not on file  Intimate Partner Violence: Not on file    Outpatient Medications Prior to Visit  Medication Sig Dispense Refill   Nutritional Supplements (IMMUNE ENHANCE) TABS Take 1 tablet by mouth daily.     amLODipine (NORVASC) 10 MG tablet TAKE 1 TABLET (10 MG TOTAL) BY MOUTH DAILY. 90 tablet 1   carvedilol (COREG) 6.25 MG tablet TAKE 1 TABLET (6.25 MG TOTAL) BY MOUTH 2 (TWO) TIMES DAILY WITH A MEAL. 60 tablet 3   ferrous sulfate 325 (65 FE) MG tablet TAKE 1 TABLET (325 MG TOTAL) BY MOUTH 2 (TWO) TIMES DAILY WITH A MEAL. 180 tablet 1   valsartan-hydrochlorothiazide (DIOVAN-HCT) 320-12.5 MG tablet Take 1 tablet by mouth daily. 90 tablet 1   No facility-administered medications prior to visit.    Allergies  Allergen Reactions   Other     Pre-breaded shrimp    ROS Review of Systems  Constitutional: Negative.   HENT: Negative.  Negative for ear pain, postnasal drip, rhinorrhea, sinus pressure, sore throat, trouble swallowing and voice change.   Eyes: Negative.   Respiratory: Negative.  Negative for apnea, cough, choking, chest tightness, shortness of breath, wheezing and stridor.   Cardiovascular: Negative.  Negative for chest pain, palpitations and leg swelling.  Gastrointestinal: Negative.  Negative for abdominal distention,  abdominal pain, nausea and vomiting.  Genitourinary: Negative.   Musculoskeletal: Negative.  Negative for arthralgias and myalgias.  Skin: Negative.  Negative for rash.  Allergic/Immunologic: Negative.  Negative for environmental allergies and food allergies.  Neurological: Negative.  Negative for dizziness, syncope, weakness and headaches.  Hematological: Negative.  Negative for adenopathy. Does not bruise/bleed easily.  Psychiatric/Behavioral: Negative.  Negative for agitation and sleep disturbance. The patient is not nervous/anxious.      Objective:    Physical Exam Vitals reviewed.  Constitutional:      Appearance: Normal appearance. He is well-developed. He is not diaphoretic.  HENT:     Head: Normocephalic and atraumatic.     Nose: No nasal deformity, septal deviation, mucosal edema or rhinorrhea.     Right Sinus: No maxillary sinus tenderness or frontal sinus tenderness.     Left  Sinus: No maxillary sinus tenderness or frontal sinus tenderness.     Mouth/Throat:     Mouth: Mucous membranes are moist.     Pharynx: Oropharynx is clear. No oropharyngeal exudate.     Comments: Very few teeth left in which teeth are left towards the front of the upper and lower jaw are very carious with severe periodontal disease Eyes:     General: No scleral icterus.    Conjunctiva/sclera: Conjunctivae normal.     Pupils: Pupils are equal, round, and reactive to light.  Neck:     Thyroid: No thyromegaly.     Vascular: No carotid bruit or JVD.     Trachea: Trachea normal. No tracheal tenderness or tracheal deviation.  Cardiovascular:     Rate and Rhythm: Normal rate and regular rhythm.     Chest Wall: PMI is not displaced.     Pulses: Normal pulses. No decreased pulses.     Heart sounds: Normal heart sounds, S1 normal and S2 normal. Heart sounds not distant. No murmur heard. No systolic murmur is present.  No diastolic murmur is present.    No friction rub. No gallop. No S3 or S4 sounds.   Pulmonary:     Effort: No tachypnea, accessory muscle usage or respiratory distress.     Breath sounds: No stridor. No decreased breath sounds, wheezing, rhonchi or rales.  Chest:     Chest wall: No tenderness.  Abdominal:     General: Bowel sounds are normal. There is no distension.     Palpations: Abdomen is soft. Abdomen is not rigid.     Tenderness: There is no abdominal tenderness. There is no guarding or rebound.  Musculoskeletal:        General: Normal range of motion.     Cervical back: Normal range of motion and neck supple. No edema, erythema or rigidity. No muscular tenderness. Normal range of motion.  Lymphadenopathy:     Head:     Right side of head: No submental or submandibular adenopathy.     Left side of head: No submental or submandibular adenopathy.     Cervical: No cervical adenopathy.  Skin:    General: Skin is warm and dry.     Coloration: Skin is not pale.     Findings: No rash.     Nails: There is no clubbing.  Neurological:     General: No focal deficit present.     Mental Status: He is alert and oriented to person, place, and time. Mental status is at baseline.     Sensory: No sensory deficit.  Psychiatric:        Mood and Affect: Mood normal.        Speech: Speech normal.        Behavior: Behavior normal.        Thought Content: Thought content normal.        Judgment: Judgment normal.    Pulse 77    Resp 16    Wt 182 lb 12.8 oz (82.9 kg)    SpO2 97%    BMI 28.63 kg/m  Wt Readings from Last 3 Encounters:  10/16/21 182 lb 12.8 oz (82.9 kg)  02/18/21 178 lb 3.2 oz (80.8 kg)  11/14/20 171 lb (77.6 kg)     There are no preventive care reminders to display for this patient.   There are no preventive care reminders to display for this patient.  No results found for: TSH Lab Results  Component Value Date  WBC 4.8 02/18/2021   HGB 13.4 02/18/2021   HCT 42.7 02/18/2021   MCV 78 (L) 02/18/2021   PLT 388 02/18/2021   Lab Results  Component  Value Date   NA 140 02/18/2021   K 4.5 02/18/2021   CO2 25 02/18/2021   GLUCOSE 64 (L) 02/18/2021   BUN 14 02/18/2021   CREATININE 1.22 02/18/2021   BILITOT <0.2 02/18/2021   ALKPHOS 86 02/18/2021   AST 10 02/18/2021   ALT 7 02/18/2021   PROT 7.3 02/18/2021   ALBUMIN 4.4 02/18/2021   CALCIUM 9.4 02/18/2021   ANIONGAP 9 10/19/2020   EGFR 61 02/18/2021   No results found for: CHOL No results found for: HDL No results found for: LDLCALC No results found for: TRIG No results found for: CHOLHDL No results found for: HGBA1C    Assessment & Plan:   Problem List Items Addressed This Visit       Cardiovascular and Mediastinum   Essential hypertension - Primary    Blood pressure not well controlled due to the fact he is not taking his medications currently  Plan to renew valsartan HCT and increase to 320/25 daily  Patient to renew amlodipine 10 mg daily  Will renew carvedilol and increased dose 25 mg twice daily  Basic metabolic panel be obtained at this visit  Patient instructed as to a healthy diet      Relevant Medications   amLODipine (NORVASC) 10 MG tablet   carvedilol (COREG) 25 MG tablet   valsartan-hydrochlorothiazide (DIOVAN-HCT) 320-25 MG tablet   Other Relevant Orders   Basic Metabolic Panel   CBC with Differential/Platelet     Digestive   Colon cancer, 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[place])    Colon cancer of the ascending colon status postresection previously  Patient needs postoperative visit with oncology this will be rescheduled      Dental caries    Significant dental caries  Patient asked to make appointment with local dentist for extractions        Other   Iron deficiency anemia due to chronic blood loss    Recommended for patient to continue iron supplement and will recheck iron levels and CBC      Relevant Medications   ferrous sulfate 325 (65 FE) MG tablet   Other Relevant Orders   CBC with Differential/Platelet   Iron, TIBC and Ferritin Panel    Influenza vaccination declined    Patient continues to refuse influenza vaccination      Refused Streptococcus pneumoniae vaccination    Patient continues to refuse pneumococcal vaccination      Tobacco use       Current smoking consumption amount: 5 to 10 cigarettes a day  Dicsussion on advise to quit smoking and smoking impacts: Oncologic lung cardiovascular impacts  Patient's willingness to quit: Not sure he is willing to quit  Methods to quit smoking discussed: Behavioral modification nicotine replacement  Medication management of smoking session drugs discussed: Nicotine replacement  Resources provided:  AVS   Setting quit date not established  Follow-up arranged 2 months  Time spent counseling the patient: 5 minutes       Cataract    Patient with bilateral cataracts  I encouraged the patient to get his Medicare started as he is past due  Then he could see ophthalmology for cataract extractions       Meds ordered this encounter  Medications   amLODipine (NORVASC) 10 MG tablet    Sig: TAKE 1 TABLET (10 MG TOTAL) BY  MOUTH DAILY.    Dispense:  90 tablet    Refill:  1   carvedilol (COREG) 25 MG tablet    Sig: Take 1 tablet (25 mg total) by mouth 2 (two) times daily with a meal.    Dispense:  180 tablet    Refill:  1   valsartan-hydrochlorothiazide (DIOVAN-HCT) 320-25 MG tablet    Sig: Take 1 tablet by mouth daily.    Dispense:  90 tablet    Refill:  3   ferrous sulfate 325 (65 FE) MG tablet    Sig: TAKE 1 TABLET (325 MG TOTAL) BY MOUTH 2 (TWO) TIMES DAILY WITH A MEAL.    Dispense:  180 tablet    Refill:  1   38 minutes spent for patient education multiple medication prescriptions high risk of complications and hospitalization due to elevated blood pressure and need for oncologic care Follow-up: Return in about 1 month (around 11/16/2021).    [name], MD

## 2021-10-17 ENCOUNTER — Telehealth: Payer: Self-pay

## 2021-10-17 LAB — BASIC METABOLIC PANEL
BUN/Creatinine Ratio: 12 (ref 10–24)
BUN: 13 mg/dL (ref 8–27)
CO2: 24 mmol/L (ref 20–29)
Calcium: 9.4 mg/dL (ref 8.6–10.2)
Chloride: 100 mmol/L (ref 96–106)
Creatinine, Ser: 1.12 mg/dL (ref 0.76–1.27)
Glucose: 88 mg/dL (ref 70–99)
Potassium: 5 mmol/L (ref 3.5–5.2)
Sodium: 138 mmol/L (ref 134–144)
eGFR: 68 mL/min/{1.73_m2} (ref >59)

## 2021-10-17 LAB — IRON,TIBC AND FERRITIN PANEL
Ferritin: 41 ng/mL (ref 30–400)
Iron Saturation: 11 % — ABNORMAL LOW (ref 15–55)
Iron: 40 ug/dL (ref 38–169)
Total Iron Binding Capacity: 379 ug/dL (ref 250–450)
UIBC: 339 ug/dL (ref 111–343)

## 2021-10-17 LAB — CBC WITH DIFFERENTIAL/PLATELET
Basophils Absolute: 0.1 10*3/uL (ref 0.0–0.2)
Basos: 1 %
EOS (ABSOLUTE): 0.1 10*3/uL (ref 0.0–0.4)
Eos: 2 %
Hematocrit: 46.3 % (ref 37.5–51.0)
Hemoglobin: 15 g/dL (ref 13.0–17.7)
Immature Grans (Abs): 0 10*3/uL (ref 0.0–0.1)
Immature Granulocytes: 0 %
Lymphocytes Absolute: 1.6 10*3/uL (ref 0.7–3.1)
Lymphs: 32 %
MCH: 28.2 pg (ref 26.6–33.0)
MCHC: 32.4 g/dL (ref 31.5–35.7)
MCV: 87 fL (ref 79–97)
Monocytes Absolute: 0.5 10*3/uL (ref 0.1–0.9)
Monocytes: 9 %
Neutrophils Absolute: 2.9 10*3/uL (ref 1.4–7.0)
Neutrophils: 56 %
Platelets: 340 10*3/uL (ref 150–450)
RBC: 5.31 x10E6/uL (ref 4.14–5.80)
RDW: 14.3 % (ref 11.6–15.4)
WBC: 5.1 10*3/uL (ref 3.4–10.8)

## 2021-10-17 NOTE — Telephone Encounter (Signed)
-----   Message from Elsie Stain, MD sent at 10/17/2021  6:13 AM EST ----- Let Aaron Roth know his kidney is normal, he is no longer anemic, he can stop the iron supplements, iron is normal  Tell him I really want him to get his pneumonia vaccine,

## 2021-10-17 NOTE — Telephone Encounter (Signed)
Pt was called and vm was left, Information has been sent to nurse pool.   

## 2021-11-19 ENCOUNTER — Other Ambulatory Visit: Payer: Self-pay

## 2021-11-19 ENCOUNTER — Ambulatory Visit: Payer: Self-pay | Attending: Critical Care Medicine | Admitting: Critical Care Medicine

## 2021-11-19 ENCOUNTER — Encounter: Payer: Self-pay | Admitting: Critical Care Medicine

## 2021-11-19 VITALS — BP 149/87 | HR 62 | Resp 16 | Wt 179.6 lb

## 2021-11-19 DIAGNOSIS — Z2821 Immunization not carried out because of patient refusal: Secondary | ICD-10-CM

## 2021-11-19 DIAGNOSIS — K029 Dental caries, unspecified: Secondary | ICD-10-CM

## 2021-11-19 DIAGNOSIS — Z720 Tobacco use: Secondary | ICD-10-CM

## 2021-11-19 DIAGNOSIS — D500 Iron deficiency anemia secondary to blood loss (chronic): Secondary | ICD-10-CM

## 2021-11-19 DIAGNOSIS — I10 Essential (primary) hypertension: Secondary | ICD-10-CM

## 2021-11-19 MED ORDER — HYDRALAZINE HCL 25 MG PO TABS
25.0000 mg | ORAL_TABLET | Freq: Two times a day (BID) | ORAL | 4 refills | Status: DC
  Filled 2021-11-19: qty 60, 30d supply, fill #0

## 2021-11-19 NOTE — Assessment & Plan Note (Signed)
° ° °•   Current smoking consumption amount: 5 to 10 cigarettes a day   Dicsussion on advise to quit smoking and smoking impacts: Oncologic lung cardiovascular impacts   Patient's willingness to quit: Not sure he is willing to quit   Methods to quit smoking discussed: Behavioral modification nicotine replacement   Medication management of smoking session drugs discussed: Nicotine replacement   Resources provided:  AVS    Setting quit date not established  Follow-up arranged 2 months  Time spent counseling the patient: 5 minutes

## 2021-11-19 NOTE — Progress Notes (Signed)
Established Patient Office Visit  Subjective:  Patient ID: Aaron Roth, male    DOB: 08/11/1946  Age: 76 y.o. MRN: [id number]  CC:  Chief Complaint  Patient presents with   Hypertension    HPI Aaron Roth presents for blood pressure follow-up.  Patient has try to be more compliant with his diet but yet on arrival blood pressure 149/87.  Patient does agree to receive the Prevnar 20 vaccine at this visit.  Patient does have cataracts he is trying to find a surgeon who will take care of him pro bono.  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   Essential hypertension 08/10/2020   GERD (gastroesophageal reflux disease)    pt. denies    Past Surgical History:  Procedure Laterality Date   BIOPSY  08/04/2020   Procedure: BIOPSY;  Surgeon: [name], MD;  [facility];  Service: Endoscopy;;  EGD and COLON   COLONOSCOPY  06/03/2012   Procedure: COLONOSCOPY;  Surgeon: [name], MD;  Location: [facility];  Service: General;  Laterality: N/A;   COLONOSCOPY WITH PROPOFOL N/A 08/04/2020   Procedure: COLONOSCOPY WITH PROPOFOL;  Surgeon: [name], MD;  [facility];  Service: Endoscopy;  Laterality: N/A;   COLOSTOMY  09/25/2011   Procedure: COLOSTOMY;  Surgeon: [name], MD;  [facility];  Service: General;  Laterality: N/A;   COLOSTOMY TAKEDOWN  07/14/2012   Procedure: LAPAROSCOPIC COLOSTOMY TAKEDOWN;  Surgeon: [name], MD;  [facility];  Service: General;  Laterality: N/A;   laparoscopic coverted to open takedown of colostomy   ESOPHAGOGASTRODUODENOSCOPY (EGD) WITH PROPOFOL N/A 08/04/2020   Procedure: ESOPHAGOGASTRODUODENOSCOPY (EGD) WITH PROPOFOL;  Surgeon: [name], MD;  [facility];  Service: Endoscopy;  Laterality: N/A;   GANGLION CYST EXCISION     wrist and foot   LAPAROSCOPIC PARTIAL COLECTOMY N/A 10/17/2020   Procedure: LAPAROSCOPIC RIGHT COLECTOMY;  Surgeon: [name], MD;  [facility];  Service: General;  Laterality: N/A;   PARTIAL COLECTOMY  09/25/2011   Procedure: PARTIAL COLECTOMY;  Surgeon: [name], MD;  [facility];  Service: General;;   POLYPECTOMY  08/04/2020   Procedure: POLYPECTOMY;  Surgeon: [name], MD;  [facility];  Service: Endoscopy;;   PORT-A-CATH REMOVAL  07/14/2012   Procedure: REMOVAL PORT-A-CATH;  Surgeon: [name], MD;  [facility];  Service: General;  Laterality: N/A;   PORTACATH PLACEMENT  11/10/2011   Procedure: INSERTION PORT-A-CATH;  Surgeon: [name], MD;  Location: [place];  Service: General;  Laterality: Left;  left subclavian   PORTACATH PLACEMENT  11/10/2011    Family History  Problem Relation Age of Onset   Bone cancer Brother    ALS Sister    Cancer Brother        bone, colon, testicular   Cancer Father        prostate    Social History   Socioeconomic History   Marital status: Married  Spouse name: Not on file   Number of children: Not on file   Years of education: Not on file   Highest education level: Not on file  Occupational History   Not on file  Tobacco Use   Smoking status: Every Day    Packs/day: 0.25    Years: 40.00    Pack years: 10.00    Types: Cigarettes   Smokeless tobacco: Never  Vaping Use   Vaping Use: Never used  Substance and Sexual Activity   Alcohol use: No   Drug use: No   Sexual activity: Not Currently  Other Topics Concern   Not on file  Social History Narrative   Not on file    Social Determinants of Health   Financial Resource Strain: Not on file  Food Insecurity: Not on file  Transportation Needs: Not on file  Physical Activity: Not on file  Stress: Not on file  Social Connections: Not on file  Intimate Partner Violence: Not on file    Outpatient Medications Prior to Visit  Medication Sig Dispense Refill   amLODipine (NORVASC) 10 MG tablet TAKE 1 TABLET (10 MG TOTAL) BY MOUTH DAILY. 90 tablet 1   carvedilol (COREG) 25 MG tablet Take 1 tablet (25 mg total) by mouth 2 (two) times daily with a meal. 180 tablet 1   ferrous sulfate 325 (65 FE) MG tablet TAKE 1 TABLET (325 MG TOTAL) BY MOUTH 2 (TWO) TIMES DAILY WITH A MEAL. 180 tablet 1   Nutritional Supplements (IMMUNE ENHANCE) TABS Take 1 tablet by mouth daily.     valsartan-hydrochlorothiazide (DIOVAN-HCT) 320-25 MG tablet Take 1 tablet by mouth daily. 90 tablet 3   No facility-administered medications prior to visit.    Allergies  Allergen Reactions   Other     Pre-breaded shrimp    ROS Review of Systems  Constitutional: Negative.   HENT: Negative.  Negative for ear pain, postnasal drip, rhinorrhea, sinus pressure, sore throat, trouble swallowing and voice change.   Eyes: Negative.   Respiratory: Negative.  Negative for apnea, cough, choking, chest tightness, shortness of breath, wheezing and stridor.   Cardiovascular: Negative.  Negative for chest pain, palpitations and leg swelling.  Gastrointestinal: Negative.  Negative for abdominal distention, abdominal pain, nausea and vomiting.  Genitourinary: Negative.   Musculoskeletal: Negative.  Negative for arthralgias and myalgias.  Skin: Negative.  Negative for rash.  Allergic/Immunologic: Negative.  Negative for environmental allergies and food allergies.  Neurological: Negative.  Negative for dizziness, syncope, weakness and headaches.  Hematological: Negative.  Negative for adenopathy. Does not bruise/bleed easily.  Psychiatric/Behavioral:  Negative.  Negative for agitation and sleep disturbance. The patient is not nervous/anxious.      Objective:    Physical Exam Vitals reviewed.  Constitutional:      Appearance: Normal appearance. He is well-developed. He is not diaphoretic.  HENT:     Head: Normocephalic and atraumatic.     Nose: No nasal deformity, septal deviation, mucosal edema or rhinorrhea.     Right Sinus: No maxillary sinus tenderness or frontal sinus tenderness.     Left Sinus: No maxillary sinus tenderness or frontal sinus tenderness.     Mouth/Throat:     Pharynx: No oropharyngeal exudate.     Comments: Poor dentition Eyes:     General: No scleral icterus.    Conjunctiva/sclera: Conjunctivae normal.     Pupils: Pupils are equal, round, and reactive to light.  Neck:     Thyroid: No thyromegaly.  Vascular: No carotid bruit or JVD.     Trachea: Trachea normal. No tracheal tenderness or tracheal deviation.  Cardiovascular:     Rate and Rhythm: Normal rate and regular rhythm.     Chest Wall: PMI is not displaced.     Pulses: Normal pulses. No decreased pulses.     Heart sounds: Normal heart sounds, S1 normal and S2 normal. Heart sounds not distant. No murmur heard. No systolic murmur is present.  No diastolic murmur is present.    No friction rub. No gallop. No S3 or S4 sounds.  Pulmonary:     Effort: No tachypnea, accessory muscle usage or respiratory distress.     Breath sounds: No stridor. No decreased breath sounds, wheezing, rhonchi or rales.  Chest:     Chest wall: No tenderness.  Abdominal:     General: Bowel sounds are normal. There is no distension.     Palpations: Abdomen is soft. Abdomen is not rigid.     Tenderness: There is no abdominal tenderness. There is no guarding or rebound.  Musculoskeletal:        General: Normal range of motion.     Cervical back: Normal range of motion and neck supple. No edema, erythema or rigidity. No muscular tenderness. Normal range of motion.   Lymphadenopathy:     Head:     Right side of head: No submental or submandibular adenopathy.     Left side of head: No submental or submandibular adenopathy.     Cervical: No cervical adenopathy.  Skin:    General: Skin is warm and dry.     Coloration: Skin is not pale.     Findings: No rash.     Nails: There is no clubbing.  Neurological:     Mental Status: He is alert and oriented to person, place, and time.     Sensory: No sensory deficit.  Psychiatric:        Speech: Speech normal.        Behavior: Behavior normal.    BP (!) 149/87    Pulse 62    Resp 16    Wt 179 lb 9.6 oz (81.5 kg)    SpO2 100%    BMI 28.13 kg/m  Wt Readings from Last 3 Encounters:  11/19/21 179 lb 9.6 oz (81.5 kg)  10/16/21 182 lb 12.8 oz (82.9 kg)  02/18/21 178 lb 3.2 oz (80.8 kg)     There are no preventive care reminders to display for this patient.  There are no preventive care reminders to display for this patient.  No results found for: TSH Lab Results  Component Value Date   WBC 5.1 10/16/2021   HGB 15.0 10/16/2021   HCT 46.3 10/16/2021   MCV 87 10/16/2021   PLT 340 10/16/2021   Lab Results  Component Value Date   NA 138 10/16/2021   K 5.0 10/16/2021   CO2 24 10/16/2021   GLUCOSE 88 10/16/2021   BUN 13 10/16/2021   CREATININE 1.12 10/16/2021   BILITOT <0.2 02/18/2021   ALKPHOS 86 02/18/2021   AST 10 02/18/2021   ALT 7 02/18/2021   PROT 7.3 02/18/2021   ALBUMIN 4.4 02/18/2021   CALCIUM 9.4 10/16/2021   ANIONGAP 9 10/19/2020   EGFR 68 10/16/2021   No results found for: CHOL No results found for: HDL No results found for: LDLCALC No results found for: TRIG No results found for: CHOLHDL No results found for: HGBA1C    Assessment & Plan:  Problem List Items Addressed This Visit       Cardiovascular and Mediastinum   Essential hypertension    Not yet at goal plan to add hydralazine 25 mg twice daily continue current medications return in 6 weeks      Relevant  Medications   hydrALAZINE (APRESOLINE) 25 MG tablet     Digestive   Dental caries    Has severe dental caries encouraged to follow-up with dentist for care        Other   Iron deficiency anemia due to chronic blood loss    Advised to continue iron supplement      Tobacco use       Current smoking consumption amount: 5 to 10 cigarettes a day  Dicsussion on advise to quit smoking and smoking impacts: Oncologic lung cardiovascular impacts  Patient's willingness to quit: Not sure he is willing to quit  Methods to quit smoking discussed: Behavioral modification nicotine replacement  Medication management of smoking session drugs discussed: Nicotine replacement  Resources provided:  AVS   Setting quit date not established  Follow-up arranged 2 months  Time spent counseling the patient: 5 minutes       RESOLVED: Refused Streptococcus pneumoniae vaccination    Patient is finally taken the pneumococcal vaccination Prevnar 20 at this visit       Meds ordered this encounter  Medications   hydrALAZINE (APRESOLINE) 25 MG tablet    Sig: Take 1 tablet (25 mg total) by mouth 2 (two) times daily.    Dispense:  60 tablet    Refill:  4    Follow-up: No follow-ups on file.    [name], MD

## 2021-11-19 NOTE — Assessment & Plan Note (Signed)
Not yet at goal plan to add hydralazine 25 mg twice daily continue current medications return in 6 weeks

## 2021-11-19 NOTE — Assessment & Plan Note (Signed)
Has severe dental caries encouraged to follow-up with dentist for care

## 2021-11-19 NOTE — Assessment & Plan Note (Signed)
Advised to continue iron supplement

## 2021-11-19 NOTE — Patient Instructions (Signed)
Start hydralazine 25mg  twice daily No other changes  Focus on stopping smoking Prevnar 20 vaccine given  See Dr Joya Gaskins 6 weeks

## 2021-11-19 NOTE — Assessment & Plan Note (Signed)
Patient is finally taken the pneumococcal vaccination Prevnar 20 at this visit

## 2021-11-24 ENCOUNTER — Other Ambulatory Visit: Payer: Self-pay

## 2022-01-06 ENCOUNTER — Ambulatory Visit: Payer: Self-pay | Admitting: Critical Care Medicine

## 2022-01-15 ENCOUNTER — Ambulatory Visit: Payer: Self-pay | Admitting: Critical Care Medicine

## 2022-02-04 ENCOUNTER — Ambulatory Visit: Payer: Self-pay | Attending: Critical Care Medicine | Admitting: Physician Assistant

## 2022-02-04 ENCOUNTER — Encounter: Payer: Self-pay | Admitting: Physician Assistant

## 2022-02-04 NOTE — Progress Notes (Deleted)
Patient ID: Aaron Roth, male   DOB: 1946-05-01, 76 y.o.   MRN: [id number] ? ?

## 2022-02-17 ENCOUNTER — Telehealth: Payer: Self-pay | Admitting: Pharmacist

## 2022-02-17 NOTE — Telephone Encounter (Signed)
Patient appearing on report for [facility] - Hypertension Control report due to last documented ambulatory blood pressure of 186/108 on 02/04/22. Next appointment with PCP is 03/18/22 ? ?Outreached patient to discuss hypertension control and medication management.  ? ?Current antihypertensives: valsartan/HCTZ 320/25 mg daily, carvedilol 25 mg twice daily, amlodipine 10 mg daily, hydralazine 25 mg twice daily  ? ?Reports that he stopped his blood pressure medications prior to cataract surgery - blood pressure on 4/6 at pre-procedure visit. He denies any headaches, chest pain, changes in vision (besides improvement in vision after procedure) ? ?Patient has an automated upper arm home BP machine, but he is not sure what brand or how accurate it is ? ?Current blood pressure readings: not checking right now, does plan to bring home cuffs to upcoming visit ? ?Dietary patterns: is not using salt for cooking;  ? ?Tobacco Use History: ?Number of cigarettes per day 3-4, trying to cut down to 2-3 ?Triggers include physical: certain meals trigger more than others, specifically fried foods. He has been trying to cut down on fried foods to help cut down on these triggers.  ? ? ?Also reports continued issues with his dental carries - reports he plans to see his dental team after his second cataract procedure.  ? ?Assessment/Plan: ?- Currently uncontrolled  ?- Reviewed risks of stopping antihypertensives. Patient elects to wait on restarting blood pressure medications until after his other eye is done on 4/28. Advised to seek emergency care if symptoms develop. He verbalizes understanding. He will restart his blood pressure regimen after he completes his second cataract procedure and will attend follow up with PCP.  ?- Encouraged to continue to focus on lifestyle modifications to help with hypertension control, including tobacco cessation ? ?Catie Hedwig Morton, PharmD, BCACP ?[place] ?[phone] ? ? ?

## 2022-02-18 NOTE — Telephone Encounter (Signed)
I connected with the patient and he misunderstood the directions of the ophthalmologist and thought he was to hold all his blood pressure medicines for his planned cataract surgeries which were states the first 1 was on the sixth and the next was on 27 April.  I told him that what he was supposed to do was to have nothing by mouth after midnight and then had the surgery he could take his blood pressure medicines later in that day but he should not stop the medicines altogether ? ?He understands and verbalized understanding and said he would resume all of his blood pressure medications I also told him I want to see him the first week in May in short-term follow-up we will contact him to make the appointment ? ?Carly please call this patient later today and double book him from the first week in May for me ? ?Barnetta Chapel thank you very much for pointing all of this out this is terrific work ?

## 2022-02-18 NOTE — Telephone Encounter (Signed)
Thanks for update, puzzled how he could have had a bp measured on 4/5 but listed as a no show appt with the PA McClung   on my visit his bp was better ? ?I will call him today  ?

## 2022-02-19 NOTE — Telephone Encounter (Signed)
Made appt

## 2022-03-05 ENCOUNTER — Encounter: Payer: Self-pay | Admitting: Critical Care Medicine

## 2022-03-05 ENCOUNTER — Ambulatory Visit: Payer: Self-pay | Attending: Critical Care Medicine | Admitting: Critical Care Medicine

## 2022-03-05 ENCOUNTER — Other Ambulatory Visit: Payer: Self-pay

## 2022-03-05 VITALS — BP 138/77 | HR 62 | Wt 181.8 lb

## 2022-03-05 DIAGNOSIS — H259 Unspecified age-related cataract: Secondary | ICD-10-CM

## 2022-03-05 DIAGNOSIS — Z720 Tobacco use: Secondary | ICD-10-CM

## 2022-03-05 DIAGNOSIS — K029 Dental caries, unspecified: Secondary | ICD-10-CM

## 2022-03-05 DIAGNOSIS — D500 Iron deficiency anemia secondary to blood loss (chronic): Secondary | ICD-10-CM

## 2022-03-05 DIAGNOSIS — R21 Rash and other nonspecific skin eruption: Secondary | ICD-10-CM | POA: Insufficient documentation

## 2022-03-05 DIAGNOSIS — I10 Essential (primary) hypertension: Secondary | ICD-10-CM

## 2022-03-05 MED ORDER — VALSARTAN-HYDROCHLOROTHIAZIDE 320-25 MG PO TABS
1.0000 | ORAL_TABLET | Freq: Every day | ORAL | 3 refills | Status: DC
  Filled 2022-03-05: qty 90, 90d supply, fill #0

## 2022-03-05 MED ORDER — AMLODIPINE BESYLATE 10 MG PO TABS
ORAL_TABLET | Freq: Every day | ORAL | 1 refills | Status: DC
  Filled 2022-03-05: qty 90, 90d supply, fill #0

## 2022-03-05 MED ORDER — TRIAMCINOLONE ACETONIDE 0.1 % EX CREA
1.0000 "application " | TOPICAL_CREAM | Freq: Two times a day (BID) | CUTANEOUS | 0 refills | Status: DC
  Filled 2022-03-05: qty 30, 15d supply, fill #0

## 2022-03-05 MED ORDER — CARVEDILOL 25 MG PO TABS
25.0000 mg | ORAL_TABLET | Freq: Two times a day (BID) | ORAL | 1 refills | Status: DC
  Filled 2022-03-05: qty 180, 90d supply, fill #0

## 2022-03-05 MED ORDER — HYDRALAZINE HCL 25 MG PO TABS
25.0000 mg | ORAL_TABLET | Freq: Two times a day (BID) | ORAL | 4 refills | Status: DC
Start: 2022-03-05 — End: 2022-10-28
  Filled 2022-03-05: qty 60, 30d supply, fill #0

## 2022-03-05 NOTE — Patient Instructions (Addendum)
No medication changes ? ?Continue to work on smoking cessation per behavioral modification recommendations were made today ? ?Return to see Dr. Joya Gaskins 2 months for blood pressure follow-up ? ?Refills on valsartan HCT sent downstairs to the pharmacy please pick this up before you leave ? ?Start steroid cream with skin moisturizer to areas on back and arms twice a day sent to our pharmacy ?

## 2022-03-05 NOTE — Assessment & Plan Note (Signed)
He has had his cataracts removed ?

## 2022-03-05 NOTE — Assessment & Plan Note (Signed)
Blood pressure now well controlled on multiple medications ? ?We went over with the patient a food as medicine diet ? ?He will continue to monitor his blood pressure at home ? ?Return in short-term follow-up 2 months ?

## 2022-03-05 NOTE — Progress Notes (Signed)
? ?Established Patient Office Visit ? ?Subjective:  ?Patient ID: Aaron Roth, male    DOB: [birth year]  Age: 76 y.o. MRN: [id number] ? ?CC:  ?Primary care follow-up blood pressure follow-up ? ?HPI ?11/19/21 ?Aaron Roth presents for blood pressure follow-up.  Patient has try to be more compliant with his diet but yet on arrival blood pressure 149/87.  Patient does agree to receive the Prevnar 20 vaccine at this visit. ? ?Patient does have cataracts he is trying to find a surgeon who will take care of him pro bono. ? ?Patient has no other complaints ?Patient is still smoking about 3 to 4 cigarettes daily ? ?03/05/2022 ?Patient returns today for follow-up of hypertension.  Patient has a blood pressure machine and brings in readings from home.  It was determined by [facility] blood pressure management team that he was hypertensive and not controlled and a phone call occurred to his home.  The Pharm.D. that called the patient determined that he had stopped all his blood pressure medicines.  He was confused because he thought when he was to have cataract surgery he was to stop all blood pressure medicines.  Instead he they just simply wanted to be nothing by mouth after midnight the night before the eye surgery. ? ?Patient's been back on his medications which include carvedilol 25 mg twice daily amlodipine 10 mg daily hydralazine 25 mg twice daily valsartan HCT 320/25 1 daily ?Medications he brings him in with him he does need refills on several the medicines but has not run out.  He is having no difficulty with he does complain of some rash on his forearm and his upper back.  He has no other complaints at this time.  Blood pressure on arrival is excellent 138/77.  He has several 142/77 blood pressures on his home meter ?Past Medical History:  ?Diagnosis Date  ? Anemia   ? 07-2020  ? Cancer [facility]) 07-01-12  ? colon-surgery and chemotherapy  ? Colon cancer, ascending ([place]) 10/17/2020  ? Colostomy care [facility]) 07-01-12  ? 09-24-12  post colon resection  ? 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 Difficult intravenous access 07-01-12  ? Has port-a-cath at present, but is to be removed 07-14-12.  ? Essential hypertension 08/10/2020  ? GERD (gastroesophageal reflux disease)   ? pt. denies  ? ? ?Past Surgical History:  ?Procedure Laterality Date  ? BIOPSY  08/04/2020  ? Procedure: BIOPSY;  Surgeon: [name], MD;  [facility];  Service: Endoscopy;;  EGD and COLON  ? COLONOSCOPY  06/03/2012  ? Procedure: COLONOSCOPY;  Surgeon: [name], MD;  Location: [facility];  Service: General;  Laterality: N/A;  ? COLONOSCOPY WITH PROPOFOL N/A 08/04/2020  ? Procedure: COLONOSCOPY WITH PROPOFOL;  Surgeon: [name], MD;  [facility];  Service: Endoscopy;  Laterality: N/A;  ? COLOSTOMY  09/25/2011  ? Procedure: COLOSTOMY;  Surgeon: [name], MD;  [facility];  Service: General;  Laterality: N/A;  ? COLOSTOMY TAKEDOWN  07/14/2012  ? Procedure: LAPAROSCOPIC COLOSTOMY TAKEDOWN;  Surgeon: [name], MD;  [facility];  Service: General;  Laterality: N/A;  laparoscopic coverted to open takedown of colostomy  ? ESOPHAGOGASTRODUODENOSCOPY (EGD) WITH PROPOFOL N/A 08/04/2020  ? Procedure: ESOPHAGOGASTRODUODENOSCOPY (EGD) WITH PROPOFOL;  Surgeon: [name], MD;  [facility];  Service: Endoscopy;  Laterality: N/A;  ? GANGLION CYST EXCISION    ? wrist and foot  ? LAPAROSCOPIC PARTIAL COLECTOMY  N/A 10/17/2020  ? Procedure: LAPAROSCOPIC RIGHT COLECTOMY;  Surgeon: [name], MD;  [facility];  Service: General;  Laterality: N/A;  ? PARTIAL COLECTOMY  09/25/2011  ? Procedure: PARTIAL COLECTOMY;  Surgeon: [name], MD;  [facility];  Service: General;;  ? POLYPECTOMY  08/04/2020  ? Procedure: POLYPECTOMY;  Surgeon: [name], MD;  [facility];  Service: Endoscopy;;  ? PORT-A-CATH REMOVAL  07/14/2012  ? Procedure: REMOVAL PORT-A-CATH;   Surgeon: [name], MD;  [facility];  Service: General;  Laterality: N/A;  ? PORTACATH PLACEMENT  11/10/2011  ? Procedure: INSERTION PORT-A-CATH;  Surgeon: [name], MD;  Location: [place];  Service: General;  Laterality: Left;  left subclavian  ? PORTACATH PLACEMENT  11/10/2011  ? ? ?Family History  ?Problem Relation Age of Onset  ? Bone cancer Brother   ? ALS Sister   ? Cancer Brother   ?     bone, colon, testicular  ? Cancer Father   ?     prostate  ? ? ?Social History  ? ?Socioeconomic History  ? Marital status: Married  ?  Spouse name: Not on file  ? Number of children: Not on file  ? Years of education: Not on file  ? Highest education level: Not on file  ?Occupational History  ? Not on file  ?Tobacco Use  ? Smoking status: Every Day  ?  Packs/day: 0.25  ?  Years: 40.00  ?  Pack years: 10.00  ?  Types: Cigarettes  ? Smokeless tobacco: Never  ?Vaping Use  ? Vaping Use: Never used  ?Substance and Sexual Activity  ? Alcohol use: No  ? Drug use: No  ? Sexual activity: Not Currently  ?Other Topics Concern  ? Not on file  ?Social History Narrative  ? Not on file  ? ?Social Determinants of Health  ? ?Financial Resource Strain: Not on file  ?Food Insecurity: Not on file  ?Transportation Needs: Not on file  ?Physical Activity: Not on file  ?Stress: Not on file  ?Social Connections: Not on file  ?Intimate Partner Violence: Not on file  ? ? ?Outpatient Medications Prior to Visit  ?Medication Sig Dispense Refill  ? ferrous sulfate 325 (65 FE) MG tablet TAKE 1 TABLET (325 MG TOTAL) BY MOUTH 2 (TWO) TIMES DAILY WITH A MEAL. 180 tablet 1  ? Nutritional Supplements (IMMUNE ENHANCE) TABS Take 1 tablet by mouth daily.    ? amLODipine (NORVASC) 10 MG tablet TAKE 1 TABLET (10 MG TOTAL) BY MOUTH DAILY. 90 tablet 1  ? carvedilol (COREG) 25 MG tablet Take 1 tablet (25 mg total) by mouth 2 (two) times daily with a meal. 180 tablet 1  ? hydrALAZINE (APRESOLINE) 25 MG tablet Take 1 tablet (25 mg total)  by mouth 2 (two) times daily. 60 tablet 4  ? valsartan-hydrochlorothiazide (DIOVAN-HCT) 320-25 MG tablet Take 1 tablet by mouth daily. 90 tablet 3  ? ?No facility-administered medications prior to visit.  ? ? ?Allergies  ?Allergen Reactions  ? Other   ?  Pre-breaded shrimp  ? ? ?ROS ?Review of Systems  ?Constitutional: Negative.   ?HENT: Negative.  Negative for ear pain, postnasal drip, rhinorrhea, sinus pressure, sore throat, trouble swallowing and voice change.   ?Eyes: Negative.   ?Respiratory: Negative.  Negative for apnea, cough, choking, chest tightness, shortness of breath, wheezing and stridor.   ?Cardiovascular: Negative.  Negative for chest pain, palpitations and leg swelling.  ?Gastrointestinal: Negative.  Negative for abdominal distention, abdominal pain, nausea and vomiting.  ?Genitourinary: Negative.   ?Musculoskeletal: Negative.  Negative for arthralgias and myalgias.  ?Skin: Negative.  Negative for rash.  ?Allergic/Immunologic: Negative.  Negative for environmental allergies and food allergies.  ?Neurological: Negative.  Negative for dizziness, syncope, weakness and headaches.  ?Hematological: Negative.  Negative for adenopathy. Does not bruise/bleed easily.  ?Psychiatric/Behavioral: Negative.  Negative for agitation and sleep disturbance. The patient is not nervous/anxious.   ? ?  ?Objective:  ?  ?Physical Exam ?Vitals reviewed.  ?Constitutional:   ?   Appearance: Normal appearance. He is well-developed. He is not diaphoretic.  ?HENT:  ?   Head: Normocephalic and atraumatic.  ?   Nose: No nasal deformity, septal deviation, mucosal edema or rhinorrhea.  ?   Right Sinus: No maxillary sinus tenderness or frontal sinus tenderness.  ?   Left Sinus: No maxillary sinus tenderness or frontal sinus tenderness.  ?   Mouth/Throat:  ?   Pharynx: No oropharyngeal exudate.  ?   Comments: Poor dentition ?Eyes:  ?   General: No scleral icterus. ?   Conjunctiva/sclera: Conjunctivae normal.  ?   Pupils: Pupils are  equal, round, and reactive to light.  ?Neck:  ?   Thyroid: No thyromegaly.  ?   Vascular: No carotid bruit or JVD.  ?   Trachea: Trachea normal. No tracheal tenderness or tracheal deviation.  ?Cardiovascu

## 2022-03-05 NOTE — Assessment & Plan Note (Signed)
Patient has existing appointment with dental care to get his teeth removed he needs this to occur and receive dentures ?

## 2022-03-05 NOTE — Assessment & Plan Note (Signed)
Trial of triamcinolone mixed with skin moisturizer ?

## 2022-03-05 NOTE — Assessment & Plan Note (Signed)
Currently off iron supplement ?

## 2022-03-05 NOTE — Assessment & Plan Note (Addendum)
? ? ??   Current smoking consumption amount: 1 to 2 cigarettes a day  . Dicsussion on advise to quit smoking and smoking impacts: Oncologic lung cardiovascular impacts  . Patient's willingness to quit: Not sure he is willing to quit  . Methods to quit smoking discussed: Behavioral modification nicotine replacement  . Medication management of smoking session drugs discussed: Nicotine replacement  . Resources provided:  AVS   . Setting quit date not established  Follow-up arranged 2 months  Time spent counseling the patient: 5 minutes  

## 2022-03-18 ENCOUNTER — Ambulatory Visit: Payer: Self-pay | Admitting: Critical Care Medicine

## 2022-04-09 IMAGING — DX DG CHEST 1V PORT
2 series · 2 of 2 positions shown · non-contrast
Comparison: November 10, 2011

CLINICAL DATA: Shortness of breath and weakness.

EXAM:
PORTABLE CHEST 1 VIEW

[chest ap (1 of 2)]
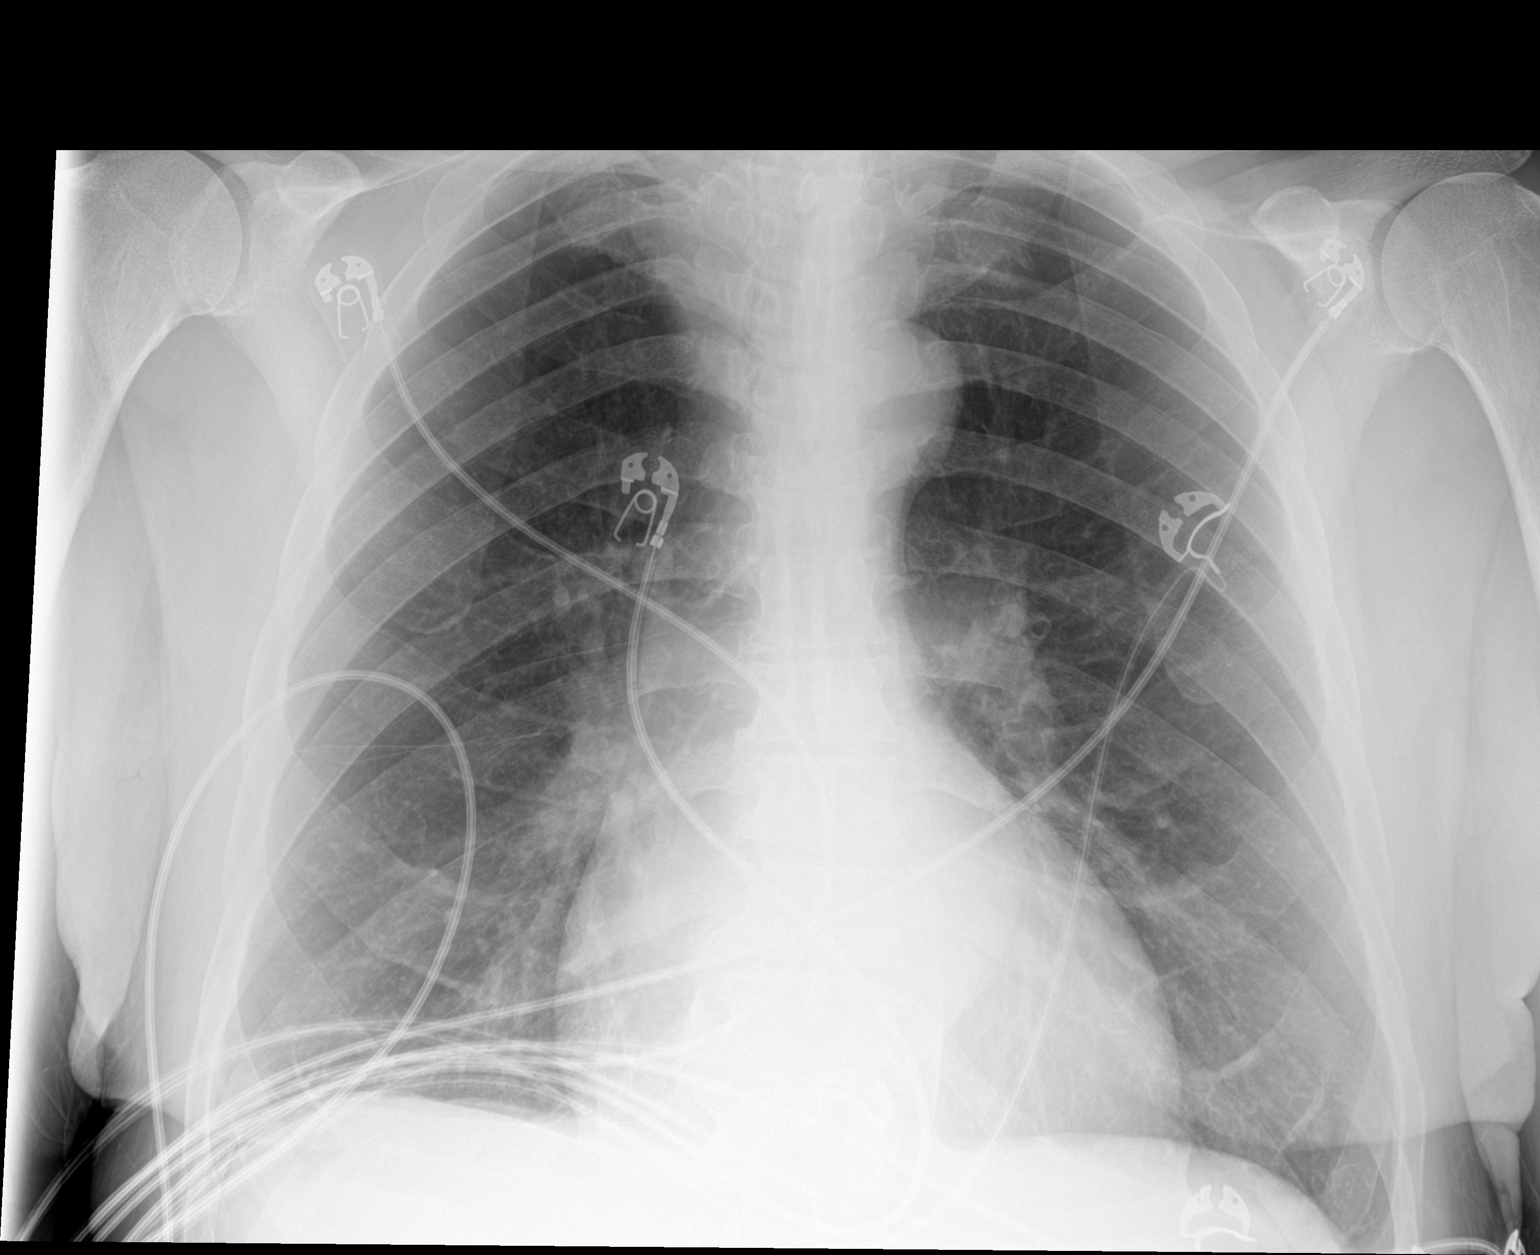

[chest ap (2 of 2)]
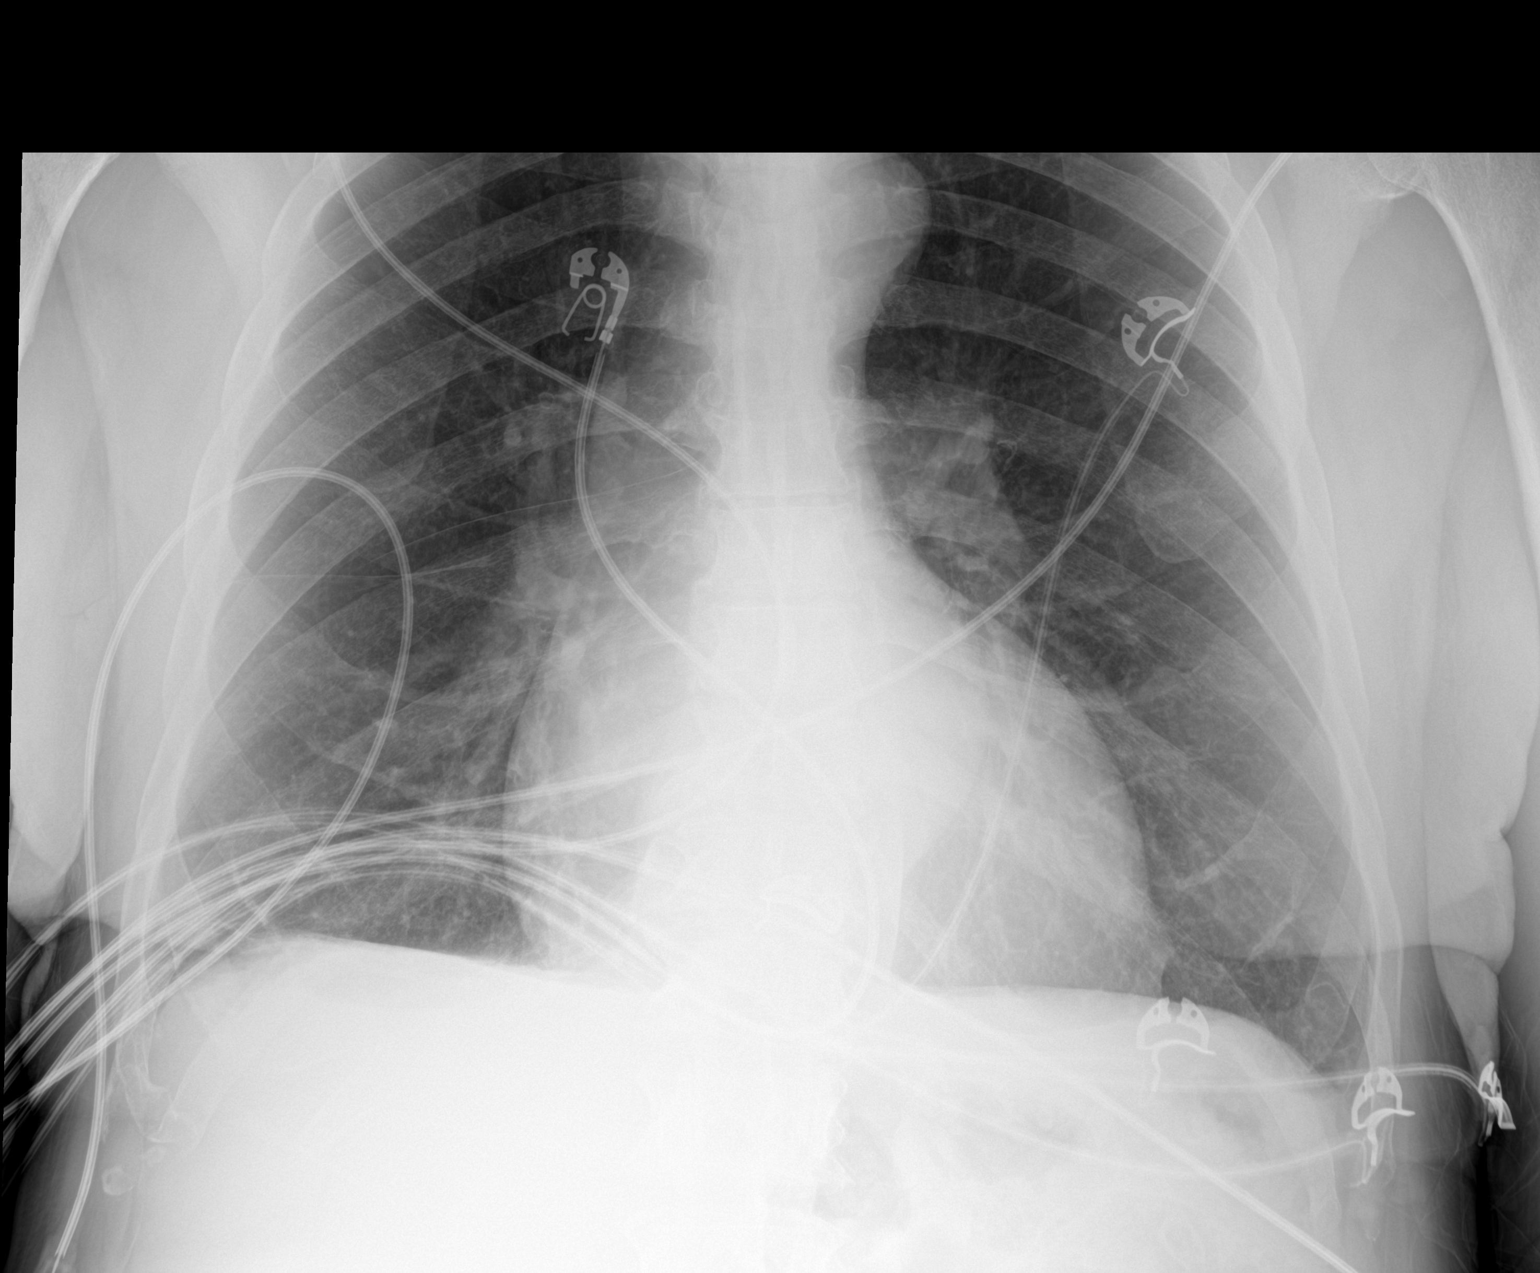

[2 of 2 positions shown; findings below may reference images not displayed]

FINDINGS: The heart size and mediastinal contours are within normal limits.
Both lungs are clear. There is tortuosity of the descending thoracic
aorta. The visualized skeletal structures are unremarkable.
IMPRESSION: No active disease.

## 2022-05-06 ENCOUNTER — Ambulatory Visit: Payer: Self-pay

## 2022-05-06 NOTE — Telephone Encounter (Signed)
Do you want to move his appt up? Currently has appt on 05/20/22

## 2022-05-06 NOTE — Telephone Encounter (Signed)
Move up sooner appt

## 2022-05-06 NOTE — Telephone Encounter (Signed)
Called and rescheduled appt to the 7/11

## 2022-05-06 NOTE — Telephone Encounter (Signed)
  Chief Complaint: occasional mild abdominal pain, felt lump near navel Symptoms: has pain if drinks something other than water Frequency: 04/22/22 Pertinent Negatives: Patient denies vomiting, diarrhea Disposition: '[]'$ ED /'[x]'$ Urgent Care (no appt availability in office) / '[]'$ Appointment(In office/virtual)/ '[]'$  [facility]/ '[]'$ Home Care/ '[x]'$ Refused Recommended Disposition /'[]'$ [facility]/ '[]'$  Follow-up with PCP Additional Notes: no available appts and only wants to see Dr Joya Gaskins. Refused UC. Pt placed on wait list. Pt with h/o colon cancer.    Reason for Disposition  Age > 60 years  Answer Assessment - Initial Assessment Questions 1. LOCATION: "Where does it hurt?"      Epigastric area 2. RADIATION: "Does the pain shoot anywhere else?" (e.g., chest, back)     no 3. ONSET: "When did the pain begin?" (Minutes, hours or days ago)      04/22/22 4. SUDDEN: "Gradual or sudden onset?"     Gradual  5. PATTERN "Does the pain come and go, or is it constant?"    - If constant: "Is it getting better, staying the same, or worsening?"      (Note: Constant means the pain never goes away completely; most serious pain is constant and it progresses)     - If intermittent: "How long does it last?" "Do you have pain now?"     (Note: Intermittent means the pain goes away completely between bouts)     Comes and goes 6. SEVERITY: "How bad is the pain?"  (e.g., Scale 1-10; mild, moderate, or severe)    - MILD (1-3): doesn't interfere with normal activities, abdomen soft and not tender to touch     - MODERATE (4-7): interferes with normal activities or awakens from sleep, abdomen tender to touch     - SEVERE (8-10): excruciating pain, doubled over, unable to do any normal activities       Mild at times 7. RECURRENT SYMPTOM: "Have you ever had this type of stomach pain before?" If Yes, ask: "When was the last time?" and "What happened that time?"      no 8. CAUSE: "What do you think is causing  the stomach pain?"     H/o colon cancer with recurrence 9. RELIEVING/AGGRAVATING FACTORS: "What makes it better or worse?" (e.g., movement, antacids, bowel movement)     If pt drinks anything other than water 10. OTHER SYMPTOMS: "Do you have any other symptoms?" (e.g., back pain, diarrhea, fever, urination pain, vomiting) Lump felt near navel  Protocols used: Abdominal Pain - Male-A-AH

## 2022-05-12 ENCOUNTER — Ambulatory Visit: Payer: Self-pay | Attending: Critical Care Medicine | Admitting: Critical Care Medicine

## 2022-05-12 ENCOUNTER — Encounter: Payer: Self-pay | Admitting: Critical Care Medicine

## 2022-05-12 VITALS — BP 174/84 | HR 80 | Wt 173.6 lb

## 2022-05-12 DIAGNOSIS — Z85038 Personal history of other malignant neoplasm of large intestine: Secondary | ICD-10-CM

## 2022-05-12 DIAGNOSIS — I10 Essential (primary) hypertension: Secondary | ICD-10-CM

## 2022-05-12 DIAGNOSIS — Z720 Tobacco use: Secondary | ICD-10-CM

## 2022-05-12 DIAGNOSIS — K029 Dental caries, unspecified: Secondary | ICD-10-CM

## 2022-05-12 NOTE — Patient Instructions (Signed)
Please take your blood pressure medicines as prescribed no change in dosing is indicated  Continue to reduce the amount of tobacco you are taking in at this time  An appointment with Dr. Benay Spice be made for follow-up of your colon cancer  Continue to eat healthy diet as we discussed  Return to see Dr. Joya Gaskins 2 months

## 2022-05-12 NOTE — Assessment & Plan Note (Signed)
  .   Current smoking consumption amount: 1 to 2 cigarettes a day  . Dicsussion on advise to quit smoking and smoking impacts: Oncologic lung cardiovascular impacts  . Patient's willingness to quit: Not sure he is willing to quit  . Methods to quit smoking discussed: Behavioral modification nicotine replacement  . Medication management of smoking session drugs discussed: Nicotine replacement  . Resources provided:  AVS   . Setting quit date not established  Follow-up arranged 2 months  Time spent counseling the patient: 5 minutes

## 2022-05-12 NOTE — Assessment & Plan Note (Signed)
Blood pressure not well controlled due to noncompliance to medication profile I reengage the patient he states he will start taking his medicines as prescribed we will bring him back in short-term follow-up with this

## 2022-05-12 NOTE — Assessment & Plan Note (Signed)
Patient knows to follow-up with dental

## 2022-05-12 NOTE — Progress Notes (Signed)
Established Patient Office Visit  Subjective:  Patient ID: Aaron Roth, male    DOB: 1946-08-19  Age: 76 y.o. MRN: [id number]  CC:  Primary care follow-up blood pressure follow-up  HPI 11/19/21 Aaron Roth presents for blood pressure follow-up.  Patient has try to be more compliant with his diet but yet on arrival blood pressure 149/87.  Patient does agree to receive the Prevnar 20 vaccine at this visit.  Patient does have cataracts he is trying to find a surgeon who will take care of him pro bono.  Patient has no other complaints Patient is still smoking about 3 to 4 cigarettes daily  03/05/2022 Patient returns today for follow-up of hypertension.  Patient has a blood pressure machine and brings in readings from home.  It was determined by [facility] blood pressure management team that he was hypertensive and not controlled and a phone call occurred to his home.  The Pharm.D. that called the patient determined that he had stopped all his blood pressure medicines.  He was confused because he thought when he was to have cataract surgery he was to stop all blood pressure medicines.  Instead he they just simply wanted to be nothing by mouth after midnight the night before the eye surgery.  Patient's been back on his medications which include carvedilol 25 mg twice daily amlodipine 10 mg daily hydralazine 25 mg twice daily valsartan HCT 320/25 1 daily Medications he brings him in with him he does need refills on several the medicines but has not run out.  He is having no difficulty with he does complain of some rash on his forearm and his upper back.  He has no other complaints at this time.  Blood pressure on arrival is excellent 138/77.  He has several 142/77 blood pressures on his home meter  7/11   This is a work in visit for abdominal pain.  Patient on arrival has an elevated blood pressure 174/81 he has not been compliant with his blood pressure medication skipping his evening doses of his  twice daily meds and missing amlodipine doses.  He had an episode about a month ago where he had emesis and abdominal pain left lower quadrant when he changed his food to healthier diet this pain went away.  He is due additional dental work at this time.  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   Essential hypertension 08/10/2020   GERD (gastroesophageal reflux disease)    pt. denies    Past Surgical History:  Procedure Laterality Date   BIOPSY  08/04/2020   Procedure: BIOPSY;  Surgeon: [name], MD;  Location: [facility];  Service: Endoscopy;;  EGD and COLON   COLONOSCOPY  06/03/2012   Procedure: COLONOSCOPY;  Surgeon: [name], MD;  Location: [facility];  Service: General;  Laterality: N/A;   COLONOSCOPY WITH PROPOFOL N/A 08/04/2020   Procedure: COLONOSCOPY WITH PROPOFOL;  Surgeon: [name], MD;  Location: [facility];  Service: Endoscopy;  Laterality: N/A;   COLOSTOMY  09/25/2011   Procedure: COLOSTOMY;  Surgeon: [name], MD;  Location: [facility];  Service: General;  Laterality: N/A;   COLOSTOMY TAKEDOWN  07/14/2012   Procedure: LAPAROSCOPIC COLOSTOMY TAKEDOWN;  Surgeon: [name], MD;  Location: [facility];  Service: General;  Laterality: N/A;  laparoscopic coverted to open takedown of colostomy   ESOPHAGOGASTRODUODENOSCOPY (EGD) WITH PROPOFOL N/A 08/04/2020   Procedure: ESOPHAGOGASTRODUODENOSCOPY (EGD) WITH PROPOFOL;  Surgeon: [name], MD;  Location: [facility];  Service: Endoscopy;  Laterality: N/A;   GANGLION  CYST EXCISION     wrist and foot   LAPAROSCOPIC PARTIAL COLECTOMY N/A 10/17/2020   Procedure: LAPAROSCOPIC RIGHT COLECTOMY;  Surgeon: [name], MD;  Location: [facility];  Service: General;  Laterality: N/A;   PARTIAL COLECTOMY  09/25/2011   Procedure: PARTIAL COLECTOMY;  Surgeon: [name], MD;  Location: [facility];  Service: General;;   POLYPECTOMY  08/04/2020   Procedure: POLYPECTOMY;  Surgeon: [name], MD;  Location: [facility];  Service: Endoscopy;;   PORT-A-CATH REMOVAL  07/14/2012   Procedure: REMOVAL PORT-A-CATH;  Surgeon: [name], MD;  Location: [facility];  Service: General;  Laterality: N/A;   PORTACATH PLACEMENT  11/10/2011   Procedure: INSERTION PORT-A-CATH;  Surgeon: [name], MD;  Location: [place];  Service: General;  Laterality: Left;  left subclavian   PORTACATH PLACEMENT  11/10/2011    Family History  Problem Relation Age of Onset   Bone cancer Brother    ALS Sister    Cancer Brother        bone, colon, testicular   Cancer Father        prostate    Social History   Socioeconomic History   Marital status: Married    Spouse name: Not on file   Number of children: Not on file   Years of education: Not on file   Highest education level: Not on file  Occupational History   Not on file  Tobacco Use   Smoking status: Every Day    Packs/day: 0.25    Years: 40.00    Total pack years: 10.00    Types: Cigarettes   Smokeless tobacco: Never  Vaping Use   Vaping Use: Never used  Substance and Sexual Activity   Alcohol use: No   Drug use: No   Sexual activity: Not Currently  Other Topics Concern   Not on file  Social History Narrative   Not on file   Social Determinants of Health   Financial Resource Strain: Not on file  Food Insecurity: Not on file  Transportation Needs: Not on file  Physical Activity: Not on file  Stress: Not on file  Social Connections: Not on file  Intimate Partner Violence: Not on file     Outpatient Medications Prior to Visit  Medication Sig Dispense Refill   amLODipine (NORVASC) 10 MG tablet TAKE 1 TABLET (10 MG TOTAL) BY MOUTH DAILY. 90 tablet 1   carvedilol (COREG) 25 MG tablet Take 1 tablet (25 mg total) by mouth 2 (two) times daily with a meal. (Patient taking differently: Take 25 mg by mouth daily.) 180 tablet 1   ferrous sulfate 325 (65 FE) MG tablet TAKE 1 TABLET (325 MG TOTAL) BY MOUTH 2 (TWO) TIMES DAILY WITH A MEAL. 180 tablet 1   hydrALAZINE (APRESOLINE) 25 MG tablet Take 1 tablet (25 mg total) by mouth 2 (two) times daily. (Patient taking differently: Take 25 mg by mouth daily.) 60 tablet 4  Nutritional Supplements (IMMUNE ENHANCE) TABS Take 1 tablet by mouth daily.     triamcinolone cream (KENALOG) 0.1 % Apply 1 application. topically 2 (two) times daily. 30 g 0   valsartan-hydrochlorothiazide (DIOVAN-HCT) 320-25 MG tablet Take 1 tablet by mouth daily. 90 tablet 3   No facility-administered medications prior to visit.    Allergies  Allergen Reactions   Other     Pre-breaded shrimp    ROS Review of Systems  Constitutional: Negative.   HENT: Negative.  Negative for ear pain, postnasal drip, rhinorrhea, sinus pressure, sore throat, trouble swallowing and voice change.   Eyes: Negative.   Respiratory: Negative.  Negative for apnea, cough, choking, chest tightness, shortness of breath, wheezing and stridor.   Cardiovascular: Negative.  Negative for chest pain, palpitations and leg swelling.  Gastrointestinal:  Positive for abdominal pain. Negative for abdominal distention, nausea and vomiting.  Genitourinary: Negative.   Musculoskeletal: Negative.  Negative for arthralgias and myalgias.  Skin: Negative.  Negative for rash.  Allergic/Immunologic: Negative.  Negative for environmental allergies and food allergies.  Neurological: Negative.  Negative for dizziness, syncope, weakness and headaches.  Hematological: Negative.  Negative for adenopathy. Does not  bruise/bleed easily.  Psychiatric/Behavioral: Negative.  Negative for agitation and sleep disturbance. The patient is not nervous/anxious.       Objective:    Physical Exam Vitals reviewed.  Constitutional:      Appearance: Normal appearance. He is well-developed. He is not diaphoretic.  HENT:     Head: Normocephalic and atraumatic.     Nose: No nasal deformity, septal deviation, mucosal edema or rhinorrhea.     Right Sinus: No maxillary sinus tenderness or frontal sinus tenderness.     Left Sinus: No maxillary sinus tenderness or frontal sinus tenderness.     Mouth/Throat:     Pharynx: No oropharyngeal exudate.     Comments: Poor dentition Eyes:     General: No scleral icterus.    Conjunctiva/sclera: Conjunctivae normal.     Pupils: Pupils are equal, round, and reactive to light.  Neck:     Thyroid: No thyromegaly.     Vascular: No carotid bruit or JVD.     Trachea: Trachea normal. No tracheal tenderness or tracheal deviation.  Cardiovascular:     Rate and Rhythm: Normal rate and regular rhythm.     Chest Wall: PMI is not displaced.     Pulses: Normal pulses. No decreased pulses.     Heart sounds: Normal heart sounds, S1 normal and S2 normal. Heart sounds not distant. No murmur heard.    No systolic murmur is present.     No diastolic murmur is present.     No friction rub. No gallop. No S3 or S4 sounds.  Pulmonary:     Effort: No tachypnea, accessory muscle usage or respiratory distress.     Breath sounds: No stridor. No decreased breath sounds, wheezing, rhonchi or rales.  Chest:     Chest wall: No tenderness.  Abdominal:     General: Bowel sounds are normal. There is no distension.     Palpations: Abdomen is soft. Abdomen is not rigid. There is no mass.     Tenderness: There is no abdominal tenderness. There is no right CVA tenderness, left CVA tenderness, guarding or rebound.     Hernia: No hernia is present.  Musculoskeletal:        General: Normal range of motion.      Cervical back: Normal range of motion and neck supple.  No edema, erythema or rigidity. No muscular tenderness. Normal range of motion.  Lymphadenopathy:     Head:     Right side of head: No submental or submandibular adenopathy.     Left side of head: No submental or submandibular adenopathy.     Cervical: No cervical adenopathy.  Skin:    General: Skin is warm and dry.     Coloration: Skin is not pale.     Findings: No rash.     Nails: There is no clubbing.  Neurological:     Mental Status: He is alert and oriented to person, place, and time.     Sensory: No sensory deficit.  Psychiatric:        Speech: Speech normal.        Behavior: Behavior normal.     BP (!) 174/84   Pulse 80   Wt 173 lb 9.6 oz (78.7 kg)   SpO2 99%   BMI 28.45 kg/m  Wt Readings from Last 3 Encounters:  05/12/22 173 lb 9.6 oz (78.7 kg)  03/05/22 181 lb 12.8 oz (82.5 kg)  02/04/22 180 lb 12.8 oz (82 kg)     There are no preventive care reminders to display for this patient.   There are no preventive care reminders to display for this patient.  No results found for: "TSH" Lab Results  Component Value Date   WBC 5.1 10/16/2021   HGB 15.0 10/16/2021   HCT 46.3 10/16/2021   MCV 87 10/16/2021   PLT 340 10/16/2021   Lab Results  Component Value Date   NA 138 10/16/2021   K 5.0 10/16/2021   CO2 24 10/16/2021   GLUCOSE 88 10/16/2021   BUN 13 10/16/2021   CREATININE 1.12 10/16/2021   BILITOT <0.2 02/18/2021   ALKPHOS 86 02/18/2021   AST 10 02/18/2021   ALT 7 02/18/2021   PROT 7.3 02/18/2021   ALBUMIN 4.4 02/18/2021   CALCIUM 9.4 10/16/2021   ANIONGAP 9 10/19/2020   EGFR 68 10/16/2021   No results found for: "CHOL" No results found for: "HDL" No results found for: "LDLCALC" No results found for: "TRIG" No results found for: "CHOLHDL" No results found for: "HGBA1C"    Assessment & Plan:   Problem List Items Addressed This Visit       Cardiovascular and Mediastinum    Essential hypertension    Blood pressure not well controlled due to noncompliance to medication profile I reengage the patient he states he will start taking his medicines as prescribed we will bring him back in short-term follow-up with this        Digestive   Dental caries    Patient knows to follow-up with dental        Other   History of colon cancer, T3, N2 (4/15 nodes), colectomy 09/25/2011.    History of colon cancer with colon cancer recurrence status post resection  We will refer patient back to oncology for follow-up      Tobacco use       Current smoking consumption amount: 1 to 2 cigarettes a day  Dicsussion on advise to quit smoking and smoking impacts: Oncologic lung cardiovascular impacts  Patient's willingness to quit: Not sure he is willing to quit  Methods to quit smoking discussed: Behavioral modification nicotine replacement  Medication management of smoking session drugs discussed: Nicotine replacement  Resources provided:  AVS   Setting quit date not established  Follow-up arranged 2 months  Time spent counseling the patient:  5 minutes      No orders of the defined types were placed in this encounter.   Follow-up: Return in about 2 months (around 07/13/2022).    [name], MD

## 2022-05-12 NOTE — Assessment & Plan Note (Signed)
History of colon cancer with colon cancer recurrence status post resection  We will refer patient back to oncology for follow-up

## 2022-05-13 ENCOUNTER — Telehealth: Payer: Self-pay | Admitting: Oncology

## 2022-05-13 NOTE — Telephone Encounter (Signed)
Attempted to contact patient in regards to referral. No answer and voicemail was full so I was unable to leave message to call back

## 2022-05-20 ENCOUNTER — Ambulatory Visit: Payer: Self-pay | Admitting: Critical Care Medicine

## 2022-06-02 ENCOUNTER — Inpatient Hospital Stay: Payer: Self-pay

## 2022-06-02 ENCOUNTER — Inpatient Hospital Stay: Payer: Self-pay | Attending: Oncology | Admitting: Oncology

## 2022-06-02 ENCOUNTER — Telehealth: Payer: Self-pay | Admitting: Genetic Counselor

## 2022-06-02 VITALS — BP 153/75 | HR 78 | Temp 97.9°F | Resp 18 | Ht 65.0 in | Wt 169.0 lb

## 2022-06-02 DIAGNOSIS — C182 Malignant neoplasm of ascending colon: Secondary | ICD-10-CM

## 2022-06-02 DIAGNOSIS — Z9049 Acquired absence of other specified parts of digestive tract: Secondary | ICD-10-CM | POA: Insufficient documentation

## 2022-06-02 DIAGNOSIS — Z85038 Personal history of other malignant neoplasm of large intestine: Secondary | ICD-10-CM | POA: Insufficient documentation

## 2022-06-02 DIAGNOSIS — I10 Essential (primary) hypertension: Secondary | ICD-10-CM | POA: Insufficient documentation

## 2022-06-02 DIAGNOSIS — R1900 Intra-abdominal and pelvic swelling, mass and lump, unspecified site: Secondary | ICD-10-CM | POA: Insufficient documentation

## 2022-06-02 DIAGNOSIS — Z933 Colostomy status: Secondary | ICD-10-CM | POA: Insufficient documentation

## 2022-06-02 DIAGNOSIS — D509 Iron deficiency anemia, unspecified: Secondary | ICD-10-CM | POA: Insufficient documentation

## 2022-06-02 DIAGNOSIS — Z800 Family history of malignant neoplasm of digestive organs: Secondary | ICD-10-CM | POA: Insufficient documentation

## 2022-06-02 LAB — CMP (CANCER CENTER ONLY)
ALT: 6 U/L (ref 0–44)
AST: 9 U/L — ABNORMAL LOW (ref 15–41)
Albumin: 4 g/dL (ref 3.5–5.0)
Alkaline Phosphatase: 65 U/L (ref 38–126)
Anion gap: 7 (ref 5–15)
BUN: 9 mg/dL (ref 8–23)
CO2: 29 mmol/L (ref 22–32)
Calcium: 8.7 mg/dL — ABNORMAL LOW (ref 8.9–10.3)
Chloride: 104 mmol/L (ref 98–111)
Creatinine: 0.95 mg/dL (ref 0.61–1.24)
GFR, Estimated: >60 mL/min (ref >60)
Glucose, Bld: 87 mg/dL (ref 70–99)
Potassium: 4 mmol/L (ref 3.5–5.1)
Sodium: 140 mmol/L (ref 135–145)
Total Bilirubin: 0.3 mg/dL (ref 0.3–1.2)
Total Protein: 6.8 g/dL (ref 6.5–8.1)

## 2022-06-02 LAB — CEA (ACCESS): CEA (CHCC): 3.39 ng/mL (ref 0.00–5.00)

## 2022-06-02 NOTE — Progress Notes (Signed)
[place] OFFICE PROGRESS NOTE   Diagnosis: Colon cancer  INTERVAL HISTORY:   Aaron Roth has a history of colon cancer.  He is followed by Dr. Joya Gaskins for management of hypertension.  He has not been seen at the Cancer center since January 2022.  He did not return for scheduled follow-up. Aaron Roth reports he is no longer taking iron.  He feels well.  No difficulty with bowel function.  No bleeding.  Good appetite.  He relates weight loss to poor dentition.  He is planning to obtain dentures.  Objective:  Vital signs in last 24 hours:  Blood pressure (!) 153/75, pulse 78, temperature 97.9 F (36.6 C), temperature source Oral, resp. rate 18, height 5' 5"  (1.651 m), weight 169 lb (76.7 kg), SpO2 98 %.    HEENT: Multiple missing teeth Lymphatics: No cervical, supraclavicular, axillary, or inguinal nodes Resp: Lungs clear bilaterally, distant breath sounds Cardio: Regular rate and rhythm GI: No hepatosplenomegaly, firm mass at the mid abdomen to center to the left of midline, the mass is mobile Vascular: No leg edema  Skin: Multiple lipomas over the back and arms   Lab Results:  Lab Results  Component Value Date   WBC 5.1 10/16/2021   HGB 15.0 10/16/2021   HCT 46.3 10/16/2021   MCV 87 10/16/2021   PLT 340 10/16/2021   NEUTROABS 2.9 10/16/2021    CMP  Lab Results  Component Value Date   NA 138 10/16/2021   K 5.0 10/16/2021   CL 100 10/16/2021   CO2 24 10/16/2021   GLUCOSE 88 10/16/2021   BUN 13 10/16/2021   CREATININE 1.12 10/16/2021   CALCIUM 9.4 10/16/2021   PROT 7.3 02/18/2021   ALBUMIN 4.4 02/18/2021   AST 10 02/18/2021   ALT 7 02/18/2021   ALKPHOS 86 02/18/2021   BILITOT <0.2 02/18/2021   GFRNONAA >60 10/19/2020   GFRAA 80 10/09/2020    Lab Results  Component Value Date   CEA1 2.0 08/04/2020   CEA 2.1 06/06/2012    No results found for: "INR", "LABPROT"  Imaging:  No results found.  Medications: I have reviewed the patient's  current medications.   Assessment/Plan: Colon cancer  CT abdomen/pelvis 08/02/2020-small amount of free fluid in the pelvis, postsurgical change involving the left colon, trace right pleural fluid.   Colonoscopy 08/04/2020-cecal mass as well as a 3 mm polyp in the ascending colon, 9 mm polyp in the ascending colon, 3 mm polyp in the transverse colon and 4 small polyps in the transverse colon.  Biopsy of the cecal mass showed adenocarcinoma.  Pathology on the polyps showed tubular adenomas with no high-grade dysplasia or malignancy identified. CT chest 08/04/2020-no evidence of metastatic disease CEA 08/04/2020-2.0 Laparoscopic right colectomy 10/17/2020 by Dr. Dorothyann Gibbs showed adenocarcinoma with mucinous features, moderately differentiated, 6.7 cm; no carcinoma identified and 23 lymph nodes; margins uninvolved; no macroscopic tumor perforation identified; tumor invaded through muscularis propria into pericolorectal tissue; no lymphovascular invasion and no perineural invasion, pT3, pN0, mismatch repair protein IHC normal, MSI stable. 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 He completed cycle 1 CAPOX beginning 11/13/2011; cycle 7 beginning 03/23/2012. Cycle 8 was completed beginning on 04/13/2012 without oxaliplatin. Restaging CT scans 06/06/2012 without evidence of recurrent disease.  Family history-brother with colon cancer Multiple polyps on colonoscopy 08/04/2020                                                                                                                                                 Disposition: Aaron Roth was diagnosed with stage III left-sided colon cancer in 2012.  He was diagnosed with stage II cancer of  the cecum in December 2021.  He has not undergone a surveillance colonoscopy.  I will refer him to Dr. Watt Climes for colonoscopy. He will be referred to the genetics clinic given his personal history of colon cancer and polyps.  He has an abdominal mass on exam today.  He agrees to a CT abdomen/pelvis to be scheduled for within the next 2 weeks.  He will return for an office visit on 06/19/2022.  He will return to the lab for a chemistry panel and CEA today.  Betsy Coder, MD  06/02/2022  11:59 AM

## 2022-06-02 NOTE — Telephone Encounter (Signed)
Scheduled appt per 8/1 referral. Called pt, no answer and vm was full. Mailed updated calendar to pt.

## 2022-06-04 ENCOUNTER — Other Ambulatory Visit: Payer: Self-pay | Admitting: *Deleted

## 2022-06-04 ENCOUNTER — Telehealth: Payer: Self-pay

## 2022-06-04 ENCOUNTER — Telehealth: Payer: Self-pay | Admitting: *Deleted

## 2022-06-04 DIAGNOSIS — C182 Malignant neoplasm of ascending colon: Secondary | ICD-10-CM

## 2022-06-04 NOTE — Telephone Encounter (Signed)
Scheduled CT scan at [facility] for 06/18/22 at 5 pm with arrival at 4:30 pm. NPO 4 hours prior and needs to drink contrast at 3 pm and 4 pm. He only has Medicare Part A, so he will be private pay for this. Radiology provided the phone # for the [place] to discuss his cost and up front cost [phone] prior to scan. Sent referral to CSW to reach out to assist in him signing up for Part B as well. Unable to reach patient or wife. Left VM on wife's number and will mail information to his home as well.

## 2022-06-04 NOTE — Telephone Encounter (Signed)
CSW attempted to contact patient per the request of RN regarding Medicare.  Was unable to leave a message on patient's vm, but left a vm on his wife's, Penelope.  Informed her to contact [employer] ([place]) for assistance at [phone] Ext. [phone].

## 2022-06-11 ENCOUNTER — Telehealth: Payer: Self-pay | Admitting: *Deleted

## 2022-06-11 NOTE — Telephone Encounter (Signed)
Aaron Roth called office and was informed of CT scan date/time and prep. He plans to call [facility] as well as CSW.

## 2022-06-17 ENCOUNTER — Inpatient Hospital Stay: Payer: Self-pay | Admitting: Licensed Clinical Social Worker

## 2022-06-17 NOTE — Progress Notes (Signed)
[facility] Work  Clinical Social Work was referred by Marine scientist to provide resources regarding insurance.  Clnical Social Worker contacted patient by phone.  He said [place], the [facility], had not returned his call.  Informed patient that his vm was full and they may not have been able to leave a message.  Encouraged patient to clear his vm.  Per patient request, CSW left a vm for Aaron Roth at [facility] also.  Patient expressed concern regarding his medical bill.  CSW emailed Otilio Carpen, Patent attorney, and requested she contact him.  Patient said he was a veteran and denied receiving benefits.  Per his request, spoke with his wife, [name], and provided information regarding [employer] and [place].  Patient and spouse expressed no other needs at this time.  Margaree Mackintosh, LCSW  Clinical Social Worker [facility]

## 2022-06-18 ENCOUNTER — Other Ambulatory Visit: Payer: Self-pay | Admitting: Oncology

## 2022-06-18 ENCOUNTER — Ambulatory Visit (HOSPITAL_BASED_OUTPATIENT_CLINIC_OR_DEPARTMENT_OTHER)
Admission: RE | Admit: 2022-06-18 | Discharge: 2022-06-18 | Disposition: A | Discharge status: Home / Self Care | Payer: Self-pay | Source: Ambulatory Visit | Attending: Oncology | Admitting: Oncology

## 2022-06-18 DIAGNOSIS — C182 Malignant neoplasm of ascending colon: Secondary | ICD-10-CM

## 2022-06-22 ENCOUNTER — Inpatient Hospital Stay (HOSPITAL_BASED_OUTPATIENT_CLINIC_OR_DEPARTMENT_OTHER): Payer: Self-pay | Admitting: Oncology

## 2022-06-22 VITALS — BP 163/79 | HR 82 | Temp 98.1°F | Resp 20 | Ht 65.0 in | Wt 166.2 lb

## 2022-06-22 DIAGNOSIS — C182 Malignant neoplasm of ascending colon: Secondary | ICD-10-CM

## 2022-06-22 NOTE — Progress Notes (Signed)
[facility] OFFICE PROGRESS NOTE   Diagnosis: Colon cancer  INTERVAL HISTORY:   Aaron Roth returns as scheduled.  No abdominal pain.  No difficulty with bowel function.  He reports limited food intake.  No specific complaint.  He has an intermittent productive cough.  Objective:  Vital signs in last 24 hours:  Blood pressure (!) 163/79, pulse 82, temperature 98.1 F (36.7 C), temperature source Oral, resp. rate 20, height 5' 5"  (1.651 m), weight 166 lb 3.2 oz (75.4 kg), SpO2 98 %.    Lymphatics: No cervical, supraclavicular, axillary, or inguinal nodes Resp: Lungs clear bilaterally, no respiratory distress Cardio: Regular rate and rhythm GI: No hepatosplenomegaly, firm mass at the mid abdomen surrounding the umbilicus Vascular: No leg edema  Lab Results:  Lab Results  Component Value Date   WBC 5.1 10/16/2021   HGB 15.0 10/16/2021   HCT 46.3 10/16/2021   MCV 87 10/16/2021   PLT 340 10/16/2021   NEUTROABS 2.9 10/16/2021    CMP  Lab Results  Component Value Date   NA 140 06/02/2022   K 4.0 06/02/2022   CL 104 06/02/2022   CO2 29 06/02/2022   GLUCOSE 87 06/02/2022   BUN 9 06/02/2022   CREATININE 0.95 06/02/2022   CALCIUM 8.7 (L) 06/02/2022   PROT 6.8 06/02/2022   ALBUMIN 4.0 06/02/2022   AST 9 (L) 06/02/2022   ALT 6 06/02/2022   ALKPHOS 65 06/02/2022   BILITOT 0.3 06/02/2022   GFRNONAA >60 06/02/2022   GFRAA 80 10/09/2020    Lab Results  Component Value Date   CEA1 2.0 08/04/2020   CEA 3.39 06/02/2022    No results found for: "INR", "LABPROT"  Imaging:  CT ABDOMEN PELVIS WO CONTRAST  Result Date: 06/21/2022 CLINICAL DATA:  Colon cancer.  Restaging.  * Tracking Code: BO * EXAM: CT ABDOMEN AND PELVIS WITHOUT CONTRAST TECHNIQUE: Multidetector CT imaging of the abdomen and pelvis was performed following the standard protocol without IV contrast. RADIATION DOSE REDUCTION: This exam was performed according to the departmental  dose-optimization program which includes automated exposure control, adjustment of the mA and/or kV according to patient size and/or use of iterative reconstruction technique. COMPARISON:  Abdomen/pelvis CT 08/02/2020.  Chest CT 08/04/2020. FINDINGS: Lower chest: 5 mm medial left lower lobe nodule on image 2/series 4 is new in the interval. Hepatobiliary: No suspicious focal abnormality in the liver on this study without intravenous contrast. There is no evidence for gallstones, gallbladder wall thickening, or pericholecystic fluid. No intrahepatic or extrahepatic biliary dilation. Pancreas: No focal mass lesion. No dilatation of the main duct. No intraparenchymal cyst. No peripancreatic edema. Spleen: No splenomegaly. No focal mass lesion. Adrenals/Urinary Tract: No adrenal nodule or mass. Kidneys unremarkable. No evidence for hydroureter. The urinary bladder appears normal for the degree of distention. Stomach/Bowel: Stomach is unremarkable. No gastric wall thickening. No evidence of outlet obstruction. Duodenum is normally positioned as is the ligament of Treitz. 10.7 x 6.7 x 8.5 cm homogeneous soft tissue mass is identified in the anterior abdomen, deep to the umbilicus. Assessment is limited by lack of intravenous contrast material. Mass lesion appears to invade the rectus sheath. Numerous small bowel loops are draped over and around the mass and there is a suggestion of small-bowel mural involvement (see axial 40/2 and coronal 65/5 without small bowel obstruction. Patient is status post right hemicolectomy. Vascular/Lymphatic: There is mild atherosclerotic calcification of the abdominal aorta without aneurysm. No retroperitoneal lymphadenopathy although assessment limited by lack of  intravenous contrast material. No pelvic sidewall lymphadenopathy. Reproductive: Prostate gland is enlarged. Other: No intraperitoneal free fluid. Musculoskeletal: No worrisome lytic or sclerotic osseous abnormality. IMPRESSION: 1.  10.7 x 6.7 x 8.5 cm homogeneous soft tissue mass in the anterior abdomen, deep to the umbilicus. Assessment is limited by lack of intravenous contrast material. Mass lesion appears to invade the rectus sheath. Numerous small bowel loops are draped over and around the mass and there is a suggestion of small-bowel mural involvement without small bowel obstruction. Findings could certainly be related to metastatic colon cancer. Given apparent small bowel mural involvement without obstruction, lymphoma would be a consideration. Primary small bowel neoplasm is less likely but not excluded. 2. 5 mm medial left lower lobe pulmonary nodule is new in the interval. Metastatic disease not excluded. 3. Prostatomegaly. 4. Status post right hemicolectomy. 5. Aortic Atherosclerosis (ICD10-I70.0). Electronically Signed   By: Misty Stanley M.D.   On: 06/21/2022 11:25    Medications: I have reviewed the patient's current medications.   Assessment/Plan: Colon cancer  CT abdomen/pelvis 08/02/2020-small amount of free fluid in the pelvis, postsurgical change involving the left colon, trace right pleural fluid.   Colonoscopy 08/04/2020-cecal mass as well as a 3 mm polyp in the ascending colon, 9 mm polyp in the ascending colon, 3 mm polyp in the transverse colon and 4 small polyps in the transverse colon.  Biopsy of the cecal mass showed adenocarcinoma.  Pathology on the polyps showed tubular adenomas with no high-grade dysplasia or malignancy identified. CT chest 08/04/2020-no evidence of metastatic disease CEA 08/04/2020-2.0 Laparoscopic right colectomy 10/17/2020 by Dr. Dorothyann Gibbs showed adenocarcinoma with mucinous features, moderately differentiated, 6.7 cm; no carcinoma identified and 23 lymph nodes; margins uninvolved; no macroscopic tumor perforation identified; tumor invaded through muscularis propria into pericolorectal tissue; no lymphovascular invasion and no perineural invasion, pT3, pN0, mismatch repair protein  IHC normal, MSI stable. 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 He completed cycle 1 CAPOX beginning 11/13/2011; cycle 7 beginning 03/23/2012. Cycle 8 was completed beginning on 04/13/2012 without oxaliplatin. Restaging CT scans 06/06/2012 without evidence of recurrent disease.   Family history-brother with colon cancer Multiple polyps on colonoscopy 08/04/2020                                                                                                                                                  Disposition: Aaron Roth appears unchanged.  There is a large superficial abdominal mass on exam again today.  This is confirmed  by the CT 06/18/2022.  The mass involves the rectus muscle and is associated with small bowel loops.  I reviewed the CT findings and images with Aaron Roth and his wife.  I discussed the differential diagnosis with Aaron Roth.  Recurrent colon cancer is the most likely diagnosis, but the differential diagnosis is broad.  He agrees to a diagnostic biopsy.  He is leaving town for a wedding next week.  He will return during the week of 07/06/2022.  He will be scheduled for a biopsy during the week of 07/13/2022.  He will return for an office visit on 07/23/2019.  Aaron Roth will seek medical attention for abdominal pain or nausea.  Betsy Coder, MD  06/22/2022  4:11 PM

## 2022-06-25 NOTE — Progress Notes (Unsigned)
Sandi Mariscal, MD  Donita Brooks D OK for CT guided anterior abdominal mass Bx.   CT AP - 8/17 - image 34, series 2.   Cathren Harsh

## 2022-07-15 ENCOUNTER — Encounter: Payer: Self-pay | Admitting: Critical Care Medicine

## 2022-07-15 ENCOUNTER — Ambulatory Visit: Payer: Self-pay | Attending: Critical Care Medicine | Admitting: Critical Care Medicine

## 2022-07-15 VITALS — BP 135/76 | HR 74 | Ht 67.0 in | Wt 166.0 lb

## 2022-07-15 DIAGNOSIS — K029 Dental caries, unspecified: Secondary | ICD-10-CM

## 2022-07-15 DIAGNOSIS — R1900 Intra-abdominal and pelvic swelling, mass and lump, unspecified site: Secondary | ICD-10-CM | POA: Insufficient documentation

## 2022-07-15 DIAGNOSIS — R1905 Periumbilic swelling, mass or lump: Secondary | ICD-10-CM

## 2022-07-15 DIAGNOSIS — I10 Essential (primary) hypertension: Secondary | ICD-10-CM

## 2022-07-15 NOTE — Progress Notes (Signed)
Established Patient Office Visit  Subjective:  Patient ID: Aaron Roth, male    DOB: 1946/09/09  Age: 76 y.o. MRN: [id number]  CC:  Primary care follow-up blood pressure follow-up  HPI 11/19/21 Aaron Roth presents for blood pressure follow-up.  Patient has try to be more compliant with his diet but yet on arrival blood pressure 149/87.  Patient does agree to receive the Prevnar 20 vaccine at this visit.  Patient does have cataracts he is trying to find a surgeon who will take care of him pro bono.  Patient has no other complaints Patient is still smoking about 3 to 4 cigarettes daily  03/05/2022 Patient returns today for follow-up of hypertension.  Patient has a blood pressure machine and brings in readings from home.  It was determined by [facility] blood pressure management team that he was hypertensive and not controlled and a phone call occurred to his home.  The Pharm.D. that called the patient determined that he had stopped all his blood pressure medicines.  He was confused because he thought when he was to have cataract surgery he was to stop all blood pressure medicines.  Instead he they just simply wanted to be nothing by mouth after midnight the night before the eye surgery.  Patient's been back on his medications which include carvedilol 25 mg twice daily amlodipine 10 mg daily hydralazine 25 mg twice daily valsartan HCT 320/25 1 daily Medications he brings him in with him he does need refills on several the medicines but has not run out.  He is having no difficulty with he does complain of some rash on his forearm and his upper back.  He has no other complaints at this time.  Blood pressure on arrival is excellent 138/77.  He has several 142/77 blood pressures on his home meter  7/11   This is a work in visit for abdominal pain.  Patient on arrival has an elevated blood pressure 174/81 he has not been compliant with his blood pressure medication skipping his evening doses of his  twice daily meds and missing amlodipine doses.  He had an episode about a month ago where he had emesis and abdominal pain left lower quadrant when he changed his food to healthier diet this pain went away.  He is due additional dental work at this time.  The patient does need a referral back to oncology for follow-up of his colon cancer  9/13 This patient is seen in return follow-up and unfortunately since July he has developed emesis and increased abdominal pain and a fullness in the abdomen and went to oncology where he was found to have an abdominal mass.  He is about to have a biopsy of this lesion.  Likely has recurrence of his colon cancer unfortunately.  From a blood pressure perspective he has stopped smoking this week which has been a major success for him.  He however occasionally does not take his afternoon dose of carvedilol and hydralazine.  He has been maintaining amlodipine daily and valsartan HCT daily.  The patient has no other complaints at this time. Below is a copy of the colon cancer evaluation at oncology August 21  Oncology appt 8/21: Colon cancer  CT abdomen/pelvis 08/02/2020-small amount of free fluid in the pelvis, postsurgical change involving the left colon, trace right pleural fluid.   Colonoscopy 08/04/2020-cecal mass as well as a 3 mm polyp in the ascending colon, 9 mm polyp in the ascending colon, 3 mm polyp in  the transverse colon and 4 small polyps in the transverse colon.  Biopsy of the cecal mass showed adenocarcinoma.  Pathology on the polyps showed tubular adenomas with no high-grade dysplasia or malignancy identified. CT chest 08/04/2020-no evidence of metastatic disease CEA 08/04/2020-2.0 Laparoscopic right colectomy 10/17/2020 by Dr. Dorothyann Gibbs showed adenocarcinoma with mucinous features, moderately differentiated, 6.7 cm; no carcinoma identified and 23 lymph nodes; margins uninvolved; no macroscopic tumor perforation identified; tumor invaded through  muscularis propria into pericolorectal tissue; no lymphovascular invasion and no perineural invasion, pT3, pN0, mismatch repair protein IHC normal, MSI stable. 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 He completed cycle 1 CAPOX beginning 11/13/2011; cycle 7 beginning 03/23/2012. Cycle 8 was completed beginning on 04/13/2012 without oxaliplatin. Restaging CT scans 06/06/2012 without evidence of recurrent disease.   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   Essential hypertension 08/10/2020   GERD (gastroesophageal reflux disease)    pt. denies    Past Surgical History:  Procedure Laterality Date   BIOPSY  08/04/2020   Procedure: BIOPSY;  Surgeon: [name], MD;  Location: [facility];  Service: Endoscopy;;  EGD and COLON   COLONOSCOPY  06/03/2012   Procedure: COLONOSCOPY;  Surgeon: [name], MD;  Location: [facility];  Service: General;  Laterality: N/A;   COLONOSCOPY WITH PROPOFOL N/A 08/04/2020   Procedure: COLONOSCOPY WITH  PROPOFOL;  Surgeon: [name], MD;  Location: [facility]OS[facility];  Service: Endoscopy;  Laterality: N/A;   COLOSTOMY  09/25/2011   Procedure: COLOSTOMY;  Surgeon: [name], MD;  Location: [facility];  Service: General;  Laterality: N/A;   COLOSTOMY TAKEDOWN  07/14/2012   Procedure: LAPAROSCOPIC COLOSTOMY TAKEDOWN;  Surgeon: [name], MD;  Location: [facility];  Service: General;  Laterality: N/A;  laparoscopic coverted to open takedown of colostomy   ESOPHAGOGASTRODUODENOSCOPY (EGD) WITH PROPOFOL N/A 08/04/2020   Procedure: ESOPHAGOGASTRODUODENOSCOPY (EGD) WITH PROPOFOL;  Surgeon: [name], MD;  Location: [facility];  Service: Endoscopy;  Laterality: N/A;   GANGLION CYST EXCISION     wrist and foot   LAPAROSCOPIC PARTIAL COLECTOMY N/A 10/17/2020   Procedure: LAPAROSCOPIC RIGHT COLECTOMY;  Surgeon: [name], MD;  Location: [facility];  Service: General;  Laterality: N/A;   PARTIAL COLECTOMY  09/25/2011   Procedure: PARTIAL COLECTOMY;  Surgeon: [name], MD;  Location: [facility];  Service: General;;   POLYPECTOMY  08/04/2020   Procedure: POLYPECTOMY;  Surgeon: [name], MD;  Location: [facility];  Service: Endoscopy;;   PORT-A-CATH REMOVAL  07/14/2012   Procedure: REMOVAL PORT-A-CATH;  Surgeon: [name], MD;  Location: [facility];  Service: General;  Laterality: N/A;   PORTACATH PLACEMENT  11/10/2011   Procedure: INSERTION PORT-A-CATH;  Surgeon: [name], MD;  Location: [facility];  Service: General;  Laterality: Left;  left subclavian   PORTACATH PLACEMENT  11/10/2011    Family History  Problem Relation Age of Onset   Bone cancer Brother    ALS Sister    Cancer Brother        bone, colon, testicular   Cancer Father        prostate    Social History   Socioeconomic History   Marital status: Married    Spouse name: Not on file   Number of children: Not on file   Years of education: Not on file   Highest education level: Not on file   Occupational History   Not on file  Tobacco Use   Smoking status: Former    Packs/day: 0.25    Years: 40.00    Total pack years: 10.00    Types: Cigarettes    Quit date: 07/12/2022   Smokeless tobacco: Never  Vaping Use   Vaping Use: Never used  Substance and Sexual Activity   Alcohol use: No   Drug use: No   Sexual activity: Not Currently  Other Topics Concern   Not on file  Social History Narrative   Not on file   Social Determinants of Health   Financial Resource Strain: Not on file  Food Insecurity: Not on file  Transportation Needs: Not on file  Physical Activity: Not on file  Stress: Not on file  Social Connections: Not on file  Intimate Partner Violence: Not on file    Outpatient Medications Prior to Visit  Medication Sig Dispense Refill   amLODipine (NORVASC) 10 MG tablet TAKE 1 TABLET (10 MG TOTAL) BY MOUTH DAILY. 90 tablet 1   ferrous sulfate 325 (65 FE) MG tablet TAKE 1 TABLET (325 MG TOTAL) BY MOUTH 2 (TWO) TIMES DAILY WITH A MEAL. 180 tablet 1   hydrALAZINE (APRESOLINE) 25 MG tablet Take 1 tablet (25 mg total) by mouth 2 (two) times daily. (Patient taking differently: Take 25 mg by mouth daily.) 60 tablet 4   Nutritional Supplements (IMMUNE ENHANCE) TABS Take 1 tablet by mouth daily.     triamcinolone cream (KENALOG) 0.1 % Apply 1 application. topically 2 (two) times daily. 30 g 0   valsartan-hydrochlorothiazide (DIOVAN-HCT) 320-25 MG tablet Take 1 tablet by mouth daily. 90 tablet 3   carvedilol (COREG) 25 MG tablet Take 1 tablet (25 mg total) by mouth 2 (two) times daily with a meal. (Patient taking differently: Take 25 mg by mouth daily.) 180 tablet 1   No facility-administered medications prior to visit.    Allergies  Allergen Reactions   Other     Pre-breaded shrimp    ROS Review of Systems  Constitutional: Negative.   HENT: Negative.  Negative for ear pain, postnasal drip, rhinorrhea, sinus pressure, sore throat, trouble swallowing and voice  change.   Eyes: Negative.   Respiratory: Negative.  Negative for apnea, cough, choking, chest tightness, shortness of breath, wheezing and stridor.   Cardiovascular: Negative.  Negative for chest pain, palpitations and leg swelling.  Gastrointestinal:  Positive for abdominal distention and abdominal pain. Negative for nausea and vomiting.  Genitourinary: Negative.   Musculoskeletal: Negative.  Negative for arthralgias and myalgias.  Skin: Negative.  Negative for rash.  Allergic/Immunologic: Negative.  Negative for environmental allergies and food allergies.  Neurological: Negative.  Negative for dizziness, syncope, weakness and headaches.  Hematological: Negative.  Negative for adenopathy. Does not bruise/bleed easily.  Psychiatric/Behavioral: Negative.  Negative for agitation and sleep disturbance. The patient is not nervous/anxious.       Objective:    Physical Exam Vitals reviewed.  Constitutional:      Appearance: Normal appearance. He is well-developed. He is not diaphoretic.  HENT:     Head: Normocephalic and atraumatic.     Nose: No nasal deformity, septal deviation, mucosal edema or rhinorrhea.     Right Sinus: No maxillary sinus tenderness or frontal sinus tenderness.     Left Sinus: No maxillary sinus tenderness or frontal sinus tenderness.     Mouth/Throat:     Pharynx: No oropharyngeal exudate.     Comments: Poor dentition Eyes:     General: No scleral icterus.    Conjunctiva/sclera: Conjunctivae normal.     Pupils: Pupils are equal, round, and reactive to light.  Neck:     Thyroid: No thyromegaly.     Vascular: No carotid bruit or JVD.     Trachea: Trachea normal. No tracheal tenderness or tracheal deviation.  Cardiovascular:     Rate and Rhythm: Normal rate and regular rhythm.     Chest Wall: PMI is not displaced.     Pulses: Normal pulses. No decreased pulses.     Heart sounds: Normal heart sounds, S1 normal and S2 normal. Heart sounds not distant. No murmur  heard.    No systolic murmur is present.     No diastolic murmur is present.     No friction rub. No gallop. No S3 or S4 sounds.  Pulmonary:     Effort: No tachypnea, accessory muscle usage or respiratory distress.     Breath sounds: No stridor. No decreased breath sounds, wheezing, rhonchi or rales.  Chest:     Chest wall: No tenderness.  Abdominal:     General: Bowel sounds are normal. There is no distension.     Palpations: Abdomen is soft. Abdomen is not rigid. There is mass.     Tenderness: There is no abdominal tenderness. There is no right CVA tenderness, left CVA tenderness, guarding or rebound.     Hernia: No hernia is present.     Comments: Firm mass is palpated in the umbilical area appears to be nontender and correlates with the CT scan findings  Musculoskeletal:        General: Normal range of motion.     Cervical back: Normal range of motion and neck supple. No edema, erythema or rigidity. No muscular tenderness. Normal range of motion.  Lymphadenopathy:     Head:     Right side of head: No submental or submandibular adenopathy.     Left side of head: No submental or submandibular adenopathy.     Cervical: No cervical adenopathy.  Skin:    General: Skin is warm and dry.     Coloration: Skin is not pale.     Findings: No rash.     Nails: There is no clubbing.  Neurological:     Mental Status: He is alert and oriented to person, place, and time.     Sensory: No sensory deficit.  Psychiatric:        Speech: Speech normal.        Behavior: Behavior normal.     BP 135/76   Pulse 74   Ht 5' 7"  (1.702 m)   Wt 166 lb (75.3 kg)  SpO2 100%   BMI 26.00 kg/m  Wt Readings from Last 3 Encounters:  07/15/22 166 lb (75.3 kg)  06/22/22 166 lb 3.2 oz (75.4 kg)  06/02/22 169 lb (76.7 kg)     There are no preventive care reminders to display for this patient.    There are no preventive care reminders to display for this patient.  No results found for: "TSH" Lab  Results  Component Value Date   WBC 5.1 10/16/2021   HGB 15.0 10/16/2021   HCT 46.3 10/16/2021   MCV 87 10/16/2021   PLT 340 10/16/2021   Lab Results  Component Value Date   NA 140 06/02/2022   K 4.0 06/02/2022   CO2 29 06/02/2022   GLUCOSE 87 06/02/2022   BUN 9 06/02/2022   CREATININE 0.95 06/02/2022   BILITOT 0.3 06/02/2022   ALKPHOS 65 06/02/2022   AST 9 (L) 06/02/2022   ALT 6 06/02/2022   PROT 6.8 06/02/2022   ALBUMIN 4.0 06/02/2022   CALCIUM 8.7 (L) 06/02/2022   ANIONGAP 7 06/02/2022   EGFR 68 10/16/2021   No results found for: "CHOL" No results found for: "HDL" No results found for: "LDLCALC" No results found for: "TRIG" No results found for: "CHOLHDL" No results found for: "HGBA1C" Recent abdominal CT scan performed by oncology reviewed and does show the presence of a 10 x 7 x 6 cm mass with small bowel involvement compatible with recurrence of colon cancer   Assessment & Plan:   Problem List Items Addressed This Visit       Cardiovascular and Mediastinum   Essential hypertension    Hypertension improved with current medication program patient encouraged to take his afternoon dose of carvedilol and hydralazine.  He was on the mistaken impression he has to take it with food he can take it without food          Digestive   Dental caries    Patient with poor dentition and is on track to have the remaining teeth removed and dentures prepared        Other   Abdominal mass    Periumbilical abdominal mass likely recurrence of colon cancer biopsy pending     No orders of the defined types were placed in this encounter. Patient declined the flu vaccine  Follow-up: Return in about 3 months (around 10/14/2022) for htn.    [name], MD

## 2022-07-15 NOTE — Assessment & Plan Note (Signed)
Patient with poor dentition and is on track to have the remaining teeth removed and dentures prepared

## 2022-07-15 NOTE — Assessment & Plan Note (Signed)
Periumbilical abdominal mass likely recurrence of colon cancer biopsy pending

## 2022-07-15 NOTE — Assessment & Plan Note (Signed)
Hypertension improved with current medication program patient encouraged to take his afternoon dose of carvedilol and hydralazine.  He was on the mistaken impression he has to take it with food he can take it without food

## 2022-07-15 NOTE — Patient Instructions (Signed)
No change in medications please remember take the afternoon doses of your blood pressure pills  Return to see Dr. Joya Gaskins 3 months  Follow-up visit with oncology

## 2022-07-22 ENCOUNTER — Inpatient Hospital Stay: Payer: Self-pay | Admitting: Oncology

## 2022-07-22 ENCOUNTER — Telehealth: Payer: Self-pay | Admitting: *Deleted

## 2022-07-22 NOTE — Telephone Encounter (Addendum)
Called Aaron Roth regarding appointment today. He thought he was seeing Dr. Benay Spice tomorrow. Informed him that 9/21 visit is for genetics counselor. Noted he is scheduled for his abdominal mass biopsy on 10/2. Will arrange to see Dr. Benay Spice afterwards. Per Dr. Benay Spice: Scheduled for 45 minutes on 08/05/22 at 2:10 pm. Scheduler notified.

## 2022-07-23 ENCOUNTER — Inpatient Hospital Stay: Payer: Self-pay

## 2022-07-23 ENCOUNTER — Inpatient Hospital Stay: Payer: Self-pay | Attending: Oncology | Admitting: Genetic Counselor

## 2022-07-30 ENCOUNTER — Other Ambulatory Visit: Payer: Self-pay | Admitting: Radiology

## 2022-07-30 DIAGNOSIS — R1900 Intra-abdominal and pelvic swelling, mass and lump, unspecified site: Secondary | ICD-10-CM

## 2022-07-31 ENCOUNTER — Other Ambulatory Visit: Payer: Self-pay | Admitting: Internal Medicine

## 2022-08-03 ENCOUNTER — Ambulatory Visit (HOSPITAL_COMMUNITY): Payer: Self-pay

## 2022-08-03 ENCOUNTER — Ambulatory Visit (HOSPITAL_COMMUNITY): Admission: RE | Admit: 2022-08-03 | Payer: Self-pay | Source: Ambulatory Visit

## 2022-08-04 ENCOUNTER — Telehealth: Payer: Self-pay | Admitting: *Deleted

## 2022-08-04 NOTE — Telephone Encounter (Signed)
Noted his biopsy was moved to 10/18. Discussed w/patient that his appointment tomorrow will be rescheduled to the week after biopsy and he agrees. Reports that several health issues are in home now with wife needing cataract surgery and he needs dental work (pain). He thinks the abdominal mass has gotten smaller as well. Scheduling message sent to move out to week of 10/23

## 2022-08-05 ENCOUNTER — Inpatient Hospital Stay: Payer: Self-pay | Admitting: Oncology

## 2022-08-19 ENCOUNTER — Encounter (HOSPITAL_COMMUNITY): Payer: Self-pay

## 2022-08-19 ENCOUNTER — Ambulatory Visit (HOSPITAL_COMMUNITY)
Admission: RE | Admit: 2022-08-19 | Discharge: 2022-08-19 | Disposition: A | Discharge status: Home / Self Care | Payer: Self-pay | Source: Ambulatory Visit | Attending: Oncology | Admitting: Oncology

## 2022-08-19 ENCOUNTER — Ambulatory Visit (HOSPITAL_COMMUNITY): Payer: Self-pay

## 2022-08-19 DIAGNOSIS — R1900 Intra-abdominal and pelvic swelling, mass and lump, unspecified site: Secondary | ICD-10-CM

## 2022-08-20 ENCOUNTER — Telehealth: Payer: Self-pay | Admitting: *Deleted

## 2022-08-20 NOTE — Telephone Encounter (Addendum)
Notified by central scheduling that patient canceled his biopsy again. MD is aware. Attempted to reach patient to inquire if he still plans to see Dr. Benay Spice on 10/24. Unable to reach him. @ [phone]--2nd attempt to reach patient without success.

## 2022-08-25 ENCOUNTER — Inpatient Hospital Stay: Payer: Self-pay | Attending: Oncology | Admitting: Oncology

## 2022-08-26 ENCOUNTER — Telehealth: Payer: Self-pay | Admitting: *Deleted

## 2022-08-26 NOTE — Telephone Encounter (Signed)
Made aware that Mr. Nay canceled his biopsy and was "no show" for his appointment with Dr. Benay Spice on 10/24. Called and left VM requesting he call office to discuss the biopsy and f/u with provider,

## 2022-09-01 ENCOUNTER — Telehealth: Payer: Self-pay | Admitting: *Deleted

## 2022-09-01 NOTE — Telephone Encounter (Signed)
Left VM again requesting return call to discuss his biopsy and f/u plans with Dr. Benay Spice.

## 2022-10-09 ENCOUNTER — Telehealth: Payer: Self-pay | Admitting: *Deleted

## 2022-10-09 NOTE — Telephone Encounter (Signed)
Left VM for [name] to call to reschedule missed appointment. Left office # and for option 2 to schedule.

## 2022-10-14 ENCOUNTER — Telehealth: Payer: Self-pay | Admitting: *Deleted

## 2022-10-14 NOTE — Telephone Encounter (Signed)
Left VM requesting return call to schedule his missed appointment w/Dr. Benay Spice and to discuss the biopsy MD suggested.

## 2022-10-20 ENCOUNTER — Ambulatory Visit: Payer: Self-pay | Admitting: Critical Care Medicine

## 2022-10-27 NOTE — Progress Notes (Signed)
Established Patient Office Visit  Subjective:  Patient ID: Aaron Roth, male    DOB: 06-22-1946  Age: 76 y.o. MRN: [id number]  CC:  Primary care follow-up blood pressure follow-up Abdominal wall mass follow-up  HPI 11/19/21 Aaron Roth presents for blood pressure follow-up.  Patient has try to be more compliant with his diet but yet on arrival blood pressure 149/87.  Patient does agree to receive the Prevnar 20 vaccine at this visit.  Patient does have cataracts he is trying to find a surgeon who will take care of him pro bono.  Patient has no other complaints Patient is still smoking about 3 to 4 cigarettes daily  03/05/2022 Patient returns today for follow-up of hypertension.  Patient has a blood pressure machine and brings in readings from home.  It was determined by [facility] blood pressure management team that he was hypertensive and not controlled and a phone call occurred to his home.  The Pharm.D. that called the patient determined that he had stopped all his blood pressure medicines.  He was confused because he thought when he was to have cataract surgery he was to stop all blood pressure medicines.  Instead he they just simply wanted to be nothing by mouth after midnight the night before the eye surgery.  Patient's been back on his medications which include carvedilol 25 mg twice daily amlodipine 10 mg daily hydralazine 25 mg twice daily valsartan HCT 320/25 1 daily Medications he brings him in with him he does need refills on several the medicines but has not run out.  He is having no difficulty with he does complain of some rash on his forearm and his upper back.  He has no other complaints at this time.  Blood pressure on arrival is excellent 138/77.  He has several 142/77 blood pressures on his home meter  7/11   This is a work in visit for abdominal pain.  Patient on arrival has an elevated blood pressure 174/81 he has not been compliant with his blood pressure medication  skipping his evening doses of his twice daily meds and missing amlodipine doses.  He had an episode about a month ago where he had emesis and abdominal pain left lower quadrant when he changed his food to healthier diet this pain went away.  He is due additional dental work at this time.  The patient does need a referral back to oncology for follow-up of his colon cancer  9/13 This patient is seen in return follow-up and unfortunately since July he has developed emesis and increased abdominal pain and a fullness in the abdomen and went to oncology where he was found to have an abdominal mass.  He is about to have a biopsy of this lesion.  Likely has recurrence of his colon cancer unfortunately.  From a blood pressure perspective he has stopped smoking this week which has been a major success for him.  He however occasionally does not take his afternoon dose of carvedilol and hydralazine.  He has been maintaining amlodipine daily and valsartan HCT daily.  The patient has no other complaints at this time. Below is a copy of the colon cancer evaluation at oncology August 21  Oncology appt 8/21: Colon cancer  CT abdomen/pelvis 08/02/2020-small amount of free fluid in the pelvis, postsurgical change involving the left colon, trace right pleural fluid.   Colonoscopy 08/04/2020-cecal mass as well as a 3 mm polyp in the ascending colon, 9 mm polyp in the ascending colon,  3 mm polyp in the transverse colon and 4 small polyps in the transverse colon.  Biopsy of the cecal mass showed adenocarcinoma.  Pathology on the polyps showed tubular adenomas with no high-grade dysplasia or malignancy identified. CT chest 08/04/2020-no evidence of metastatic disease CEA 08/04/2020-2.0 Laparoscopic right colectomy 10/17/2020 by Dr. Dorothyann Gibbs showed adenocarcinoma with mucinous features, moderately differentiated, 6.7 cm; no carcinoma identified and 23 lymph nodes; margins uninvolved; no macroscopic tumor perforation  identified; tumor invaded through muscularis propria into pericolorectal tissue; no lymphovascular invasion and no perineural invasion, pT3, pN0, mismatch repair protein IHC normal, MSI stable. 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 He completed cycle 1 CAPOX beginning 11/13/2011; cycle 7 beginning 03/23/2012. Cycle 8 was completed beginning on 04/13/2012 without oxaliplatin. Restaging CT scans 06/06/2012 without evidence of recurrent disease.   Family history-brother with colon cancer Multiple polyps on colonoscopy 08/04/2020   CT Abd mass Bx 08/03/22 scheduled Blood pressure initially are elevated on arrival on recheck is 135/76  10/28/22 Patient seen in return follow-up blood pressure remains high systolic levels are high diastolic are acceptable.  He forgets to take his afternoon carvedilol dose.  He also states hydralazine causes itching when he takes this and has not been taking this medication.  He has been compliant with his other blood pressure medicines.  He is yet to get the abdominal mass biopsy recommended by oncology.  He does have dental conditions he is undergoing dental care to have all teeth extracted.  He has no other complaints. 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   Essential hypertension 08/10/2020   GERD (gastroesophageal reflux disease)    pt. denies    Past Surgical History:  Procedure Laterality Date   BIOPSY  08/04/2020   Procedure: BIOPSY;  Surgeon: [name], MD;  Location: [facility];  Service: Endoscopy;;  EGD and COLON   COLONOSCOPY  06/03/2012   Procedure: COLONOSCOPY;  Surgeon: [name], MD;  Location: [facility];  Service: General;  Laterality: N/A;   COLONOSCOPY WITH PROPOFOL N/A 08/04/2020   Procedure: COLONOSCOPY WITH PROPOFOL;  Surgeon: [name], MD;  Location: [facility];  Service: Endoscopy;  Laterality: N/A;   COLOSTOMY  09/25/2011   Procedure: COLOSTOMY;  Surgeon: [name], MD;  Location: [facility];  Service: General;  Laterality: N/A;   COLOSTOMY TAKEDOWN  07/14/2012   Procedure: LAPAROSCOPIC COLOSTOMY TAKEDOWN;  Surgeon: [name], MD;  Location: [facility];  Service: General;  Laterality: N/A;  laparoscopic coverted to open takedown of colostomy   ESOPHAGOGASTRODUODENOSCOPY (EGD) WITH PROPOFOL N/A 08/04/2020   Procedure: ESOPHAGOGASTRODUODENOSCOPY (EGD) WITH PROPOFOL;  Surgeon: [name], MD;  Location: [facility];  Service: Endoscopy;  Laterality: N/A;   GANGLION CYST  EXCISION     wrist and foot   LAPAROSCOPIC PARTIAL COLECTOMY N/A 10/17/2020   Procedure: LAPAROSCOPIC RIGHT COLECTOMY;  Surgeon: [name], MD;  Location: [facility];  Service: General;  Laterality: N/A;   PARTIAL COLECTOMY  09/25/2011   Procedure: PARTIAL COLECTOMY;  Surgeon: [name], MD;  Location: [facility];  Service: General;;   POLYPECTOMY  08/04/2020   Procedure: POLYPECTOMY;  Surgeon: [name], MD;  Location: [facility];  Service: Endoscopy;;   PORT-A-CATH REMOVAL  07/14/2012   Procedure: REMOVAL PORT-A-CATH;  Surgeon: [name], MD;  Location: [facility];  Service: General;  Laterality: N/A;   PORTACATH  PLACEMENT  11/10/2011   Procedure: INSERTION PORT-A-CATH;  Surgeon: [name], MD;  Location: [facility];  Service: General;  Laterality: Left;  left subclavian   PORTACATH PLACEMENT  11/10/2011    Family History  Problem Relation Age of Onset   Bone cancer Brother    ALS Sister    Cancer Brother        bone, colon, testicular   Cancer Father        prostate    Social History   Socioeconomic History   Marital status: Married    Spouse name: Not on file   Number of children: Not on file   Years of education: Not on file   Highest education level: Not on file  Occupational History   Not on file  Tobacco Use   Smoking status: Former    Packs/day: 0.25    Years: 40.00    Total pack years: 10.00    Types: Cigarettes    Quit date: 07/12/2022    Years since quitting: 0.2   Smokeless tobacco: Never  Vaping Use   Vaping Use: Never used  Substance and Sexual Activity   Alcohol use: No   Drug use: No   Sexual activity: Not Currently  Other Topics Concern   Not on file  Social History Narrative   Not on file   Social Determinants of Health   Financial Resource Strain: Not on file  Food Insecurity: Not on file  Transportation Needs: Not on file  Physical Activity: Not on file  Stress: Not on file  Social Connections: Not on file  Intimate Partner Violence: Not on file    Outpatient Medications Prior to Visit  Medication Sig Dispense Refill   Nutritional Supplements (IMMUNE ENHANCE) TABS Take 1 tablet by mouth daily.     triamcinolone cream (KENALOG) 0.1 % Apply 1 application. topically 2 (two) times daily. 30 g 0   amLODipine (NORVASC) 10 MG tablet TAKE 1 TABLET (10 MG TOTAL) BY MOUTH DAILY. 90 tablet 1   hydrALAZINE (APRESOLINE) 25 MG tablet Take 1 tablet (25 mg total) by mouth 2 (two) times daily. (Patient taking differently: Take 25 mg by mouth daily.) 60 tablet 4   valsartan-hydrochlorothiazide (DIOVAN-HCT) 320-25 MG tablet Take 1 tablet by mouth  daily. 90 tablet 3   ferrous sulfate 325 (65 FE) MG tablet TAKE 1 TABLET (325 MG TOTAL) BY MOUTH 2 (TWO) TIMES DAILY WITH A MEAL. 180 tablet 1   carvedilol (COREG) 25 MG tablet Take 1 tablet (25 mg total) by mouth 2 (two) times daily with a meal. (Patient taking differently: Take 25 mg by mouth daily.) 180 tablet 1   No facility-administered medications prior to visit.    Allergies  Allergen Reactions   Hydralazine Itching   Other     Pre-breaded shrimp  ROS Review of Systems  Constitutional: Negative.   HENT: Negative.  Negative for ear pain, postnasal drip, rhinorrhea, sinus pressure, sore throat, trouble swallowing and voice change.   Eyes: Negative.   Respiratory: Negative.  Negative for apnea, cough, choking, chest tightness, shortness of breath, wheezing and stridor.   Cardiovascular: Negative.  Negative for chest pain, palpitations and leg swelling.  Gastrointestinal:  Positive for abdominal distention and abdominal pain. Negative for nausea and vomiting.  Genitourinary: Negative.   Musculoskeletal: Negative.  Negative for arthralgias and myalgias.  Skin: Negative.  Negative for rash.  Allergic/Immunologic: Negative.  Negative for environmental allergies and food allergies.  Neurological: Negative.  Negative for dizziness, syncope, weakness and headaches.  Hematological: Negative.  Negative for adenopathy. Does not bruise/bleed easily.  Psychiatric/Behavioral: Negative.  Negative for agitation and sleep disturbance. The patient is not nervous/anxious.       Objective:    Physical Exam Vitals reviewed.  Constitutional:      Appearance: Normal appearance. He is well-developed. He is not diaphoretic.  HENT:     Head: Normocephalic and atraumatic.     Nose: No nasal deformity, septal deviation, mucosal edema or rhinorrhea.     Right Sinus: No maxillary sinus tenderness or frontal sinus tenderness.     Left Sinus: No maxillary sinus tenderness or frontal sinus  tenderness.     Mouth/Throat:     Pharynx: No oropharyngeal exudate.     Comments: Poor dentition Eyes:     General: No scleral icterus.    Conjunctiva/sclera: Conjunctivae normal.     Pupils: Pupils are equal, round, and reactive to light.  Neck:     Thyroid: No thyromegaly.     Vascular: No carotid bruit or JVD.     Trachea: Trachea normal. No tracheal tenderness or tracheal deviation.  Cardiovascular:     Rate and Rhythm: Normal rate and regular rhythm.     Chest Wall: PMI is not displaced.     Pulses: Normal pulses. No decreased pulses.     Heart sounds: Normal heart sounds, S1 normal and S2 normal. Heart sounds not distant. No murmur heard.    No systolic murmur is present.     No diastolic murmur is present.     No friction rub. No gallop. No S3 or S4 sounds.  Pulmonary:     Effort: No tachypnea, accessory muscle usage or respiratory distress.     Breath sounds: No stridor. No decreased breath sounds, wheezing, rhonchi or rales.  Chest:     Chest wall: No tenderness.  Abdominal:     General: Bowel sounds are normal. There is no distension.     Palpations: Abdomen is soft. Abdomen is not rigid. There is mass.     Tenderness: There is no abdominal tenderness. There is no right CVA tenderness, left CVA tenderness, guarding or rebound.     Hernia: No hernia is present.     Comments: Firm mass is palpated in the umbilical area appears to be nontender and correlates with the CT scan findings, seems to be enlarging  Musculoskeletal:        General: Normal range of motion.     Cervical back: Normal range of motion and neck supple. No edema, erythema or rigidity. No muscular tenderness. Normal range of motion.  Lymphadenopathy:     Head:     Right side of head: No submental or submandibular adenopathy.     Left side of head: No submental or submandibular adenopathy.  Cervical: No cervical adenopathy.  Skin:    General: Skin is warm and dry.     Coloration: Skin is not pale.      Findings: No rash.     Nails: There is no clubbing.  Neurological:     Mental Status: He is alert and oriented to person, place, and time.     Sensory: No sensory deficit.  Psychiatric:        Speech: Speech normal.        Behavior: Behavior normal.     BP (!) 141/78   Pulse 90   Temp 98.1 F (36.7 C) (Oral)   Ht _0  (1.702 m)   Wt 163 lb (73.9 kg)   SpO2 100%   BMI 25.53 kg/m  Wt Readings from Last 3 Encounters:  10/28/22 163 lb (73.9 kg)  07/15/22 166 lb (75.3 kg)  06/22/22 166 lb 3.2 oz (75.4 kg)     There are no preventive care reminders to display for this patient.     There are no preventive care reminders to display for this patient.  No results found for: "TSH" Lab Results  Component Value Date   WBC 5.1 10/16/2021   HGB 15.0 10/16/2021   HCT 46.3 10/16/2021   MCV 87 10/16/2021   PLT 340 10/16/2021   Lab Results  Component Value Date   NA 140 06/02/2022   K 4.0 06/02/2022   CO2 29 06/02/2022   GLUCOSE 87 06/02/2022   BUN 9 06/02/2022   CREATININE 0.95 06/02/2022   BILITOT 0.3 06/02/2022   ALKPHOS 65 06/02/2022   AST 9 (L) 06/02/2022   ALT 6 06/02/2022   PROT 6.8 06/02/2022   ALBUMIN 4.0 06/02/2022   CALCIUM 8.7 (L) 06/02/2022   ANIONGAP 7 06/02/2022   EGFR 68 10/16/2021   No results found for: "CHOL" No results found for: "HDL" No results found for: "LDLCALC" No results found for: "TRIG" No results found for: "CHOLHDL" No results found for: "HGBA1C" Recent abdominal CT scan performed by oncology reviewed and does show the presence of a 10 x 7 x 6 cm mass with small bowel involvement compatible with recurrence of colon cancer   Assessment & Plan:   Problem List Items Addressed This Visit       Cardiovascular and Mediastinum   Essential hypertension    Hypertension not controlled adverse effect of hydralazine encouraged to take afternoon carvedilol dose continue other medications      Relevant Medications   amLODipine  (NORVASC) 10 MG tablet   carvedilol (COREG) 25 MG tablet   valsartan-hydrochlorothiazide (DIOVAN-HCT) 320-25 MG tablet     Digestive   Dental caries    Patient continues with dental care        Other   History of colon cancer, T3, N2 (4/15 nodes), colectomy 09/25/2011. - Primary   Relevant Orders   CT ABDOMINAL MASS BIOPSY   Abdominal mass    Spent a great deal of time with this patient asking him to get the abdominal wall mass biopsied been putting this off so we can get his teeth repaired.  He now agrees to have the mass biopsied I then spent time on secure chat with oncology Dr. Gearldine Shown office.  They have arranged for the abdominal CT-guided biopsy that he missed in October.  He will then have follow-up with oncology based on results      Other Visit Diagnoses     Mass of anterior abdominal wall  Relevant Orders   CT ABDOMINAL MASS BIOPSY      Meds ordered this encounter  Medications   amLODipine (NORVASC) 10 MG tablet    Sig: TAKE 1 TABLET (10 MG TOTAL) BY MOUTH DAILY.    Dispense:  90 tablet    Refill:  1    Fill today   carvedilol (COREG) 25 MG tablet    Sig: Take 1 tablet (25 mg total) by mouth 2 (two) times daily with a meal.    Dispense:  180 tablet    Refill:  1    Fill today   valsartan-hydrochlorothiazide (DIOVAN-HCT) 320-25 MG tablet    Sig: Take 1 tablet by mouth daily.    Dispense:  90 tablet    Refill:  3    Fill today  Patient declined the flu vaccine  Follow-up: Return in about 2 months (around 12/29/2022) for htn, chronic conditions.    [name], MD

## 2022-10-28 ENCOUNTER — Other Ambulatory Visit: Payer: Self-pay

## 2022-10-28 ENCOUNTER — Ambulatory Visit: Payer: Self-pay | Attending: Critical Care Medicine | Admitting: Critical Care Medicine

## 2022-10-28 ENCOUNTER — Encounter: Payer: Self-pay | Admitting: Critical Care Medicine

## 2022-10-28 VITALS — BP 141/78 | HR 90 | Temp 98.1°F | Ht 67.0 in | Wt 163.0 lb

## 2022-10-28 DIAGNOSIS — R222 Localized swelling, mass and lump, trunk: Secondary | ICD-10-CM

## 2022-10-28 DIAGNOSIS — R1905 Periumbilic swelling, mass or lump: Secondary | ICD-10-CM

## 2022-10-28 DIAGNOSIS — K029 Dental caries, unspecified: Secondary | ICD-10-CM

## 2022-10-28 DIAGNOSIS — I10 Essential (primary) hypertension: Secondary | ICD-10-CM

## 2022-10-28 DIAGNOSIS — Z85038 Personal history of other malignant neoplasm of large intestine: Secondary | ICD-10-CM

## 2022-10-28 MED ORDER — AMLODIPINE BESYLATE 10 MG PO TABS
ORAL_TABLET | Freq: Every day | ORAL | 1 refills | Status: DC
  Filled 2022-10-28: qty 90, fill #0

## 2022-10-28 MED ORDER — VALSARTAN-HYDROCHLOROTHIAZIDE 320-25 MG PO TABS
1.0000 | ORAL_TABLET | Freq: Every day | ORAL | 3 refills | Status: DC
  Filled 2022-10-28: qty 90, 90d supply, fill #0

## 2022-10-28 MED ORDER — CARVEDILOL 25 MG PO TABS
25.0000 mg | ORAL_TABLET | Freq: Two times a day (BID) | ORAL | 1 refills | Status: DC
  Filled 2022-10-28: qty 180, 90d supply, fill #0

## 2022-10-28 NOTE — Patient Instructions (Addendum)
Stop hydralazine  No change other medications refills sent  Abdominal biopsy will be obtained then will see Dr Benay Spice  Return Dr Joya Gaskins 8 week

## 2022-10-29 NOTE — Assessment & Plan Note (Signed)
Hypertension not controlled adverse effect of hydralazine encouraged to take afternoon carvedilol dose continue other medications

## 2022-10-29 NOTE — Assessment & Plan Note (Signed)
Spent a great deal of time with this patient asking him to get the abdominal wall mass biopsied been putting this off so we can get his teeth repaired.  He now agrees to have the mass biopsied I then spent time on secure chat with oncology Dr. Gearldine Shown office.  They have arranged for the abdominal CT-guided biopsy that he missed in October.  He will then have follow-up with oncology based on results

## 2022-10-29 NOTE — Assessment & Plan Note (Addendum)
Patient continues with dental care

## 2022-10-30 ENCOUNTER — Other Ambulatory Visit: Payer: Self-pay

## 2022-11-03 ENCOUNTER — Other Ambulatory Visit (HOSPITAL_COMMUNITY): Payer: Self-pay | Admitting: Student

## 2022-11-04 ENCOUNTER — Ambulatory Visit (HOSPITAL_COMMUNITY)
Admission: RE | Admit: 2022-11-04 | Discharge: 2022-11-04 | Disposition: A | Discharge status: Home / Self Care | Payer: Self-pay | Source: Ambulatory Visit | Attending: Critical Care Medicine | Admitting: Critical Care Medicine

## 2022-11-04 ENCOUNTER — Other Ambulatory Visit: Payer: Self-pay | Admitting: Nurse Practitioner

## 2022-11-04 ENCOUNTER — Encounter (HOSPITAL_COMMUNITY): Payer: Self-pay

## 2022-11-04 ENCOUNTER — Emergency Department (HOSPITAL_COMMUNITY)
Admission: EM | Admit: 2022-11-04 | Discharge: 2022-11-04 | Disposition: A | Discharge status: Home / Self Care | Payer: Self-pay | Attending: Emergency Medicine | Admitting: Emergency Medicine

## 2022-11-04 ENCOUNTER — Other Ambulatory Visit: Payer: Self-pay

## 2022-11-04 ENCOUNTER — Telehealth: Payer: Self-pay

## 2022-11-04 ENCOUNTER — Telehealth: Payer: Self-pay | Admitting: Nurse Practitioner

## 2022-11-04 DIAGNOSIS — C189 Malignant neoplasm of colon, unspecified: Secondary | ICD-10-CM | POA: Insufficient documentation

## 2022-11-04 DIAGNOSIS — D649 Anemia, unspecified: Secondary | ICD-10-CM | POA: Insufficient documentation

## 2022-11-04 DIAGNOSIS — Z85038 Personal history of other malignant neoplasm of large intestine: Secondary | ICD-10-CM

## 2022-11-04 DIAGNOSIS — I10 Essential (primary) hypertension: Secondary | ICD-10-CM | POA: Insufficient documentation

## 2022-11-04 DIAGNOSIS — C182 Malignant neoplasm of ascending colon: Secondary | ICD-10-CM

## 2022-11-04 DIAGNOSIS — Z9049 Acquired absence of other specified parts of digestive tract: Secondary | ICD-10-CM | POA: Insufficient documentation

## 2022-11-04 DIAGNOSIS — R1900 Intra-abdominal and pelvic swelling, mass and lump, unspecified site: Secondary | ICD-10-CM

## 2022-11-04 DIAGNOSIS — R911 Solitary pulmonary nodule: Secondary | ICD-10-CM | POA: Insufficient documentation

## 2022-11-04 DIAGNOSIS — R1905 Periumbilic swelling, mass or lump: Secondary | ICD-10-CM | POA: Insufficient documentation

## 2022-11-04 DIAGNOSIS — I700 Atherosclerosis of aorta: Secondary | ICD-10-CM | POA: Insufficient documentation

## 2022-11-04 DIAGNOSIS — N400 Enlarged prostate without lower urinary tract symptoms: Secondary | ICD-10-CM | POA: Insufficient documentation

## 2022-11-04 DIAGNOSIS — Z9889 Other specified postprocedural states: Secondary | ICD-10-CM | POA: Insufficient documentation

## 2022-11-04 DIAGNOSIS — Z87891 Personal history of nicotine dependence: Secondary | ICD-10-CM | POA: Insufficient documentation

## 2022-11-04 DIAGNOSIS — Z79899 Other long term (current) drug therapy: Secondary | ICD-10-CM | POA: Insufficient documentation

## 2022-11-04 DIAGNOSIS — R222 Localized swelling, mass and lump, trunk: Secondary | ICD-10-CM

## 2022-11-04 DIAGNOSIS — K219 Gastro-esophageal reflux disease without esophagitis: Secondary | ICD-10-CM | POA: Insufficient documentation

## 2022-11-04 LAB — CBC WITH DIFFERENTIAL/PLATELET
Abs Immature Granulocytes: 0.06 10*3/uL (ref 0.00–0.07)
Abs Immature Granulocytes: 0.03 10*3/uL (ref 0.00–0.07)
Basophils Absolute: 0 10*3/uL (ref 0.0–0.1)
Basophils Absolute: 0 10*3/uL (ref 0.0–0.1)
Basophils Relative: 1 %
Basophils Relative: 0 %
Eosinophils Absolute: 0.1 10*3/uL (ref 0.0–0.5)
Eosinophils Absolute: 0.1 10*3/uL (ref 0.0–0.5)
Eosinophils Relative: 2 %
Eosinophils Relative: 2 %
HCT: 22 % — ABNORMAL LOW (ref 39.0–52.0)
HCT: 18.7 % — ABNORMAL LOW (ref 39.0–52.0)
Hemoglobin: 5.4 g/dL — CL (ref 13.0–17.0)
Hemoglobin: 4.5 g/dL — CL (ref 13.0–17.0)
Immature Granulocytes: 1 %
Immature Granulocytes: 1 %
Lymphocytes Relative: 15 %
Lymphocytes Relative: 21 %
Lymphs Abs: 1.3 10*3/uL (ref 0.7–4.0)
Lymphs Abs: 1.2 10*3/uL (ref 0.7–4.0)
MCH: 14.6 pg — ABNORMAL LOW (ref 26.0–34.0)
MCH: 15 pg — ABNORMAL LOW (ref 26.0–34.0)
MCHC: 24.5 g/dL — ABNORMAL LOW (ref 30.0–36.0)
MCHC: 24.1 g/dL — ABNORMAL LOW (ref 30.0–36.0)
MCV: 59.3 fL — ABNORMAL LOW (ref 80.0–100.0)
MCV: 62.3 fL — ABNORMAL LOW (ref 80.0–100.0)
Monocytes Absolute: 0.7 10*3/uL (ref 0.1–1.0)
Monocytes Absolute: 0.5 10*3/uL (ref 0.1–1.0)
Monocytes Relative: 9 %
Monocytes Relative: 8 %
Neutro Abs: 6 10*3/uL (ref 1.7–7.7)
Neutro Abs: 3.8 10*3/uL (ref 1.7–7.7)
Neutrophils Relative %: 72 %
Neutrophils Relative %: 68 %
Platelets: 637 10*3/uL — ABNORMAL HIGH (ref 150–400)
Platelets: 275 10*3/uL (ref 150–400)
RBC: 3.71 MIL/uL — ABNORMAL LOW (ref 4.22–5.81)
RBC: 3 MIL/uL — ABNORMAL LOW (ref 4.22–5.81)
RDW: 22 % — ABNORMAL HIGH (ref 11.5–15.5)
RDW: 21.8 % — ABNORMAL HIGH (ref 11.5–15.5)
WBC: 8.2 10*3/uL (ref 4.0–10.5)
WBC: 5.6 10*3/uL (ref 4.0–10.5)
nRBC: 0 % (ref 0.0–0.2)
nRBC: 0 % (ref 0.0–0.2)

## 2022-11-04 LAB — BASIC METABOLIC PANEL
Anion gap: 8 (ref 5–15)
BUN: 14 mg/dL (ref 8–23)
CO2: 26 mmol/L (ref 22–32)
Calcium: 8.2 mg/dL — ABNORMAL LOW (ref 8.9–10.3)
Chloride: 101 mmol/L (ref 98–111)
Creatinine, Ser: 0.95 mg/dL (ref 0.61–1.24)
GFR, Estimated: >60 mL/min (ref >60)
Glucose, Bld: 88 mg/dL (ref 70–99)
Potassium: 3.5 mmol/L (ref 3.5–5.1)
Sodium: 135 mmol/L (ref 135–145)

## 2022-11-04 LAB — PROTIME-INR
INR: 1.2 (ref 0.8–1.2)
Prothrombin Time: 15.1 seconds (ref 11.4–15.2)

## 2022-11-04 MED ORDER — FENTANYL CITRATE (PF) 100 MCG/2ML IJ SOLN
INTRAMUSCULAR | Status: AC
  Filled 2022-11-04: qty 2

## 2022-11-04 MED ORDER — SODIUM CHLORIDE 0.9 % IV SOLN: INTRAVENOUS | Status: DC

## 2022-11-04 MED ORDER — MIDAZOLAM HCL 2 MG/2ML IJ SOLN
INTRAMUSCULAR | Status: AC
  Filled 2022-11-04: qty 2

## 2022-11-04 MED ORDER — FENTANYL CITRATE (PF) 100 MCG/2ML IJ SOLN
INTRAMUSCULAR | Status: AC | PRN
  Administered 2022-11-04: 50 ug via INTRAVENOUS

## 2022-11-04 MED ORDER — MIDAZOLAM HCL 2 MG/2ML IJ SOLN
INTRAMUSCULAR | Status: AC | PRN
  Administered 2022-11-04: .5 mg via INTRAVENOUS

## 2022-11-04 MED ORDER — MIDAZOLAM HCL 2 MG/2ML IJ SOLN
INTRAMUSCULAR | Status: AC | PRN
  Administered 2022-11-04: 1 mg via INTRAVENOUS

## 2022-11-04 MED ORDER — NALOXONE HCL 0.4 MG/ML IJ SOLN
INTRAMUSCULAR | Status: AC
  Filled 2022-11-04: qty 1

## 2022-11-04 MED ORDER — FENTANYL CITRATE (PF) 100 MCG/2ML IJ SOLN
INTRAMUSCULAR | Status: AC | PRN
  Administered 2022-11-04: 25 ug via INTRAVENOUS

## 2022-11-04 MED ORDER — FLUMAZENIL 0.5 MG/5ML IV SOLN
INTRAVENOUS | Status: AC
  Filled 2022-11-04: qty 5

## 2022-11-04 NOTE — Progress Notes (Signed)
Patient ID: Aaron Roth, male   DOB: Jun 21, 1946, 77 y.o.   MRN: [id number] Patient's follow-up hemoglobin today is 4.5.  VSS; AF. Discussed with Dr. Benay Spice who recommends transfer of patient to ED for blood transfusion/possible admission by [facility].

## 2022-11-04 NOTE — Discharge Instructions (Addendum)
Start back on your iron pill until you see Dr. Benay Spice

## 2022-11-04 NOTE — Progress Notes (Signed)
Repeat blood draw was actually done upon arrival back from IR at 1200.   Paged K Allred PA of new results of Hgb 4.5.   1300 also called Dr. Kathlene Cote of results (per RN Filomena Jungling) 1400- talked with K Allred and will take pt. To ED upon request of Dr. Benay Spice (see notes)

## 2022-11-04 NOTE — Discharge Instructions (Signed)
For questions /concerns may call Interventional Radiology at [phone] or  Interventional Radiology clinic [phone]   You may remove your dressing and shower tomorrow afternoon  Moderate Conscious Sedation, Adult, Care After This sheet gives you information about how to care for yourself after your procedure. Your health care provider may also give you more specific instructions. If you have problems or questions, contact your health careprovider. What can I expect after the procedure? After the procedure, it is common to have: Sleepiness for several hours. Impaired judgment for several hours. Difficulty with balance. Vomiting if you eat too soon. Follow these instructions at home: For the time period you were told by your health care provider: Rest. Do not participate in activities where you could fall or become injured. Do not drive or use machinery. Do not drink alcohol. Do not take sleeping pills or medicines that cause drowsiness. Do not make important decisions or sign legal documents. Do not take care of children on your own. Eating and drinking  Follow the diet recommended by your health care provider. Drink enough fluid to keep your urine pale yellow. If you vomit: Drink water, juice, or soup when you can drink without vomiting. Make sure you have little or no nausea before eating solid foods.  General instructions Take over-the-counter and prescription medicines only as told by your health care provider. Have a responsible adult stay with you for the time you are told. It is important to have someone help care for you until you are awake and alert. Do not smoke. Keep all follow-up visits as told by your health care provider. This is important. Contact a health care provider if: You are still sleepy or having trouble with balance after 24 hours. You feel light-headed. You keep feeling nauseous or you keep vomiting. You develop a rash. You have a fever. You have  redness or swelling around the IV site. Get help right away if: You have trouble breathing. You have new-onset confusion at home. Summary After the procedure, it is common to feel sleepy, have impaired judgment, or feel nauseous if you eat too soon. Rest after you get home. Know the things you should not do after the procedure. Follow the diet recommended by your health care provider and drink enough fluid to keep your urine pale yellow. Get help right away if you have trouble breathing or new-onset confusion at home. This information is not intended to replace advice given to you by your health care provider. Make sure you discuss any questions you have with your healthcare provider. Document Revised: 02/16/2020 Document Reviewed: 09/14/2019 Elsevier Patient Education  2022 [employer]  

## 2022-11-04 NOTE — Telephone Encounter (Signed)
Received phone call from Dr. Elberta [facility] emergency department.  [name] underwent biopsy of the abdominal wall mass today in Interventional Radiology.  Initial CBC returned with hemoglobin 5.4, MCV 89, repeat CBC with hemoglobin 4.5, MCV 62.  He was referred to the emergency department.  Per Dr. Maryan Rued he has declined Red cell transfusion support and admission, appears stable.  We have requested additional labs, ferritin and reticulocyte panel.  He will resume oral iron.  If he changes his mind regarding hospital admission Dr. Benay Spice will see him tomorrow morning.  Otherwise we will make an appointment for him to be seen in our office in the next 1 to 2 days.

## 2022-11-04 NOTE — Consult Note (Signed)
Chief Complaint: Patient was seen in consultation today for image guided anterior abdominal mass biopsy   Referring Physician(s): [place]  Supervising Physician: Aletta Edouard  Patient Status: [facility] - Out-pt  History of Present Illness: Aaron Roth is a 77 y.o. male with PMH sig for anemia, HTN, GERD and colon cancer 2012 and again in 2021 (s/p surgery/chemo) who presents now with f/u CT A/P on 06/18/22 revealing:  1. 10.7 x 6.7 x 8.5 cm homogeneous soft tissue mass in the anterior abdomen, deep to the umbilicus. Assessment is limited by lack of intravenous contrast material. Mass lesion appears to invade the rectus sheath. Numerous small bowel loops are draped over and around the mass and there is a suggestion of small-bowel mural involvement without small bowel obstruction. Findings could certainly be related to metastatic colon cancer. Given apparent small bowel mural involvement without obstruction, lymphoma would be a consideration. Primary small bowel neoplasm is less likely but not excluded. 2. 5 mm medial left lower lobe pulmonary nodule is new in the interval. Metastatic disease not excluded. 3. Prostatomegaly. 4. Status post right hemicolectomy. 5. Aortic Atherosclerosis   He is scheduled today for image guided biopsy of the ant abdominal mass for further evaluation.   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   Essential hypertension 08/10/2020   GERD (gastroesophageal reflux disease)    pt. denies    Past Surgical History:  Procedure Laterality Date   BIOPSY  08/04/2020    Procedure: BIOPSY;  Surgeon: [name], MD;  Location: [facility];  Service: Endoscopy;;  EGD and COLON   COLONOSCOPY  06/03/2012   Procedure: COLONOSCOPY;  Surgeon: [name], MD;  Location: [facility];  Service: General;  Laterality: N/A;   COLONOSCOPY WITH PROPOFOL N/A 08/04/2020   Procedure: COLONOSCOPY WITH PROPOFOL;  Surgeon: [name], MD;  Location: [facility];  Service: Endoscopy;  Laterality: N/A;   COLOSTOMY  09/25/2011   Procedure: COLOSTOMY;  Surgeon: [name], MD;  Location: [facility];  Service: General;  Laterality: N/A;   COLOSTOMY TAKEDOWN  07/14/2012   Procedure: LAPAROSCOPIC COLOSTOMY TAKEDOWN;  Surgeon: [name], MD;  Location: [facility];  Service: General;  Laterality: N/A;  laparoscopic coverted to open takedown of colostomy   ESOPHAGOGASTRODUODENOSCOPY (EGD) WITH PROPOFOL N/A 08/04/2020   Procedure: ESOPHAGOGASTRODUODENOSCOPY (EGD) WITH PROPOFOL;  Surgeon: [name], MD;  Location: [facility];  Service: Endoscopy;  Laterality: N/A;   GANGLION CYST EXCISION     wrist and foot   LAPAROSCOPIC PARTIAL COLECTOMY N/A 10/17/2020   Procedure: LAPAROSCOPIC RIGHT COLECTOMY;  Surgeon: [name], MD;  Location: [facility];  Service: General;  Laterality: N/A;   PARTIAL COLECTOMY  09/25/2011   Procedure: PARTIAL COLECTOMY;  Surgeon: [name], MD;  Location: [facility];  Service: General;;   POLYPECTOMY  08/04/2020   Procedure: POLYPECTOMY;  Surgeon: [name], MD;  Location: [facility];  Service: Endoscopy;;   PORT-A-CATH REMOVAL  07/14/2012   Procedure: REMOVAL PORT-A-CATH;  Surgeon: [name], MD;  Location: [facility];  Service: General;  Laterality: N/A;   PORTACATH PLACEMENT  11/10/2011   Procedure: INSERTION PORT-A-CATH;  Surgeon: [name], MD;  Location: [place];  Service: General;  Laterality: Left;  left subclavian   PORTACATH PLACEMENT  11/10/2011    Allergies: Hydralazine and Other  Medications: Prior to  Admission medications   Medication Sig Start Date End Date Taking? Authorizing Provider  amLODipine (NORVASC) 10 MG tablet TAKE 1 TABLET (10 MG TOTAL) BY MOUTH DAILY. 10/28/22 10/28/23  Elsie Stain, MD  carvedilol (COREG) 25 MG tablet Take 1 tablet (25 mg total) by mouth 2 (two) times daily with a meal. 10/28/22 01/26/23  Elsie Stain, MD  ferrous sulfate 325 (65 FE) MG tablet TAKE 1 TABLET (325 MG TOTAL) BY MOUTH 2 (TWO) TIMES DAILY WITH A MEAL. 10/16/21 10/16/22  Elsie Stain, MD  Nutritional Supplements (IMMUNE ENHANCE) TABS Take 1 tablet by mouth daily.    [provider]  triamcinolone cream (KENALOG) 0.1 % Apply 1 application. topically 2 (two) times daily. 03/05/22   Elsie Stain, MD  valsartan-hydrochlorothiazide (DIOVAN-HCT) 320-25 MG tablet Take 1 tablet by mouth daily. 10/28/22   Elsie Stain, MD     Family History  Problem Relation Age of Onset   Bone cancer Brother    ALS Sister    Cancer Brother        bone, colon, testicular   Cancer Father        prostate    Social History   Socioeconomic History   Marital status: Married    Spouse name: Not on file   Number of children: Not on file   Years of education: Not on file   Highest education level: Not on file  Occupational History   Not on file  Tobacco Use   Smoking status: Former    Packs/day: 0.25    Years: 40.00    Total pack years: 10.00    Types: Cigarettes    Quit date: 07/12/2022    Years since quitting: 0.3   Smokeless tobacco: Never  Vaping Use   Vaping Use: Never used  Substance and Sexual Activity   Alcohol use: No   Drug use: No   Sexual activity: Not Currently  Other Topics Concern   Not on file  Social History Narrative   Not on file   Social Determinants of Health   Financial Resource Strain: Not on file  Food Insecurity: Not on file  Transportation Needs: Not on file  Physical Activity: Not on file  Stress: Not on file  Social Connections: Not on file      ROS: denies fever,HA,CP, dyspnea, cough, abd/back pain,N/V or bleeding   Vital Signs:  BP 152/81, HR 75, R 18, TEMP 98.2, O2 SATS 100% RA        Physical Exam awake/alert; chest-CTA bilat; heart- RRR; abd soft,+BS, palpable mass mid abd region,NT; no LE edema  Imaging: No results found.  Labs:  CBC: No results for input(s): "WBC", "HGB", "HCT", "PLT" in the last 8760 hours.  COAGS: No results for input(s): "INR", "APTT" in the last 8760 hours.  BMP: Recent Labs    06/02/22 1235  NA 140  K 4.0  CL 104  CO2 29  GLUCOSE 87  BUN 9  CALCIUM 8.7*  CREATININE 0.95  GFRNONAA >60    LIVER FUNCTION TESTS: Recent Labs    06/02/22 1235  BILITOT 0.3  AST 9*  ALT 6  ALKPHOS 65  PROT 6.8  ALBUMIN 4.0    TUMOR MARKERS:  Recent Labs    06/02/22 1235  CEA 3.39    Assessment and Plan: 77 y.o. male with PMH sig for anemia, HTN, GERD and colon cancer 2012 and again in 2021 (s/p surgery/chemo) who presents now with f/u CT A/P on 06/18/22 revealing:  1. 10.7 x 6.7 x 8.5 cm homogeneous soft tissue mass in the anterior abdomen, deep to the umbilicus. Assessment is limited by lack of intravenous contrast material. Mass lesion appears to invade the rectus sheath. Numerous small bowel loops are draped over and around the mass and there is a suggestion of small-bowel mural involvement without small bowel obstruction. Findings could certainly be related to metastatic colon cancer. Given apparent small bowel mural involvement without obstruction, lymphoma would be a consideration. Primary small bowel neoplasm is less likely but not excluded. 2. 5 mm medial left lower lobe pulmonary nodule is new in the interval. Metastatic disease not excluded. 3. Prostatomegaly. 4. Status post right hemicolectomy. 5. Aortic Atherosclerosis   He is scheduled today for image guided biopsy of the ant abdominal mass for further evaluation.Risks and benefits of procedure was discussed  with the patient including, but not limited to bleeding, infection, damage to adjacent structures or low yield requiring additional tests.  All of the questions were answered and there is agreement to proceed.  Consent signed and in chart.    Thank you for this interesting consult.  I greatly enjoyed meeting ENGLISH CRAIGHEAD and look forward to participating in their care.  A copy of this report was sent to the requesting provider on this date.  Electronically Signed: D. Rowe Robert, PA-C 11/04/2022, 9:49 AM   I spent a total of  25 minutes   in face to face in clinical consultation, greater than 50% of which was counseling/coordinating care for image guided biopsy of anterior abdominal mass

## 2022-11-04 NOTE — ED Provider Notes (Signed)
Aaron [facility] DEPT Provider Note   CSN: [id number] Arrival date & time: 11/04/22  1439     History  Chief Complaint  Patient presents with   Abnormal Labs    Aaron Roth is a 77 y.o. male.  Patient is a 77 year old male with a history of hypertension, prior colon cancer status post resection and chemotherapy with prior iron deficiency anemia who is presenting today from IR after having a biopsy due to anemia.  Patient reports today is the first time he has had his hemoglobin checked since the spring.  In the spring Dr. Joya Roth told him that his hemoglobin looked good and he could discontinue his iron which he did.  He has not had it checked again till today when it was 5.4.  They rechecked and it was 4.5.  They sent him here for a blood transfusion.  However patient reports he feels his normal self.  He may be a little bit tired but has not had chest pain, shortness of breath, dizziness, syncope.  He has not noticed black stools.  They did notice a new mass that has returned to his abdomen and that is what he was getting biopsied today.  There was not excessive bleeding with this procedure.  He reports that he is leery of getting a blood transfusion because when he received 1 several years ago he had a rash and an unpleasant reaction.  He does not take any anticoagulation.  The history is provided by the patient and medical records.       Home Medications Prior to Admission medications   Medication Sig Start Date End Date Taking? Authorizing Provider  amLODipine (NORVASC) 10 MG tablet TAKE 1 TABLET (10 MG TOTAL) BY MOUTH DAILY. 10/28/22 10/28/23  Elsie Stain, MD  carvedilol (COREG) 25 MG tablet Take 1 tablet (25 mg total) by mouth 2 (two) times daily with a meal. 10/28/22 01/26/23  Elsie Stain, MD  ferrous sulfate 325 (65 FE) MG tablet TAKE 1 TABLET (325 MG TOTAL) BY MOUTH 2 (TWO) TIMES DAILY WITH A MEAL. 10/16/21 10/16/22  Elsie Stain, MD   Nutritional Supplements (IMMUNE ENHANCE) TABS Take 1 tablet by mouth daily.    [provider]  triamcinolone cream (KENALOG) 0.1 % Apply 1 application. topically 2 (two) times daily. 03/05/22   Elsie Stain, MD  valsartan-hydrochlorothiazide (DIOVAN-HCT) 320-25 MG tablet Take 1 tablet by mouth daily. 10/28/22   Elsie Stain, MD      Allergies    Hydralazine and Other    Review of Systems   Review of Systems  Physical Exam Updated Vital Signs There were no vitals taken for this visit. Physical Exam Vitals and nursing note reviewed.  Constitutional:      General: He is not in acute distress.    Appearance: He is well-developed.  HENT:     Head: Normocephalic and atraumatic.  Eyes:     Pupils: Pupils are equal, round, and reactive to light.     Comments: Pale conjunctiva  Cardiovascular:     Rate and Rhythm: Normal rate and regular rhythm.     Heart sounds: No murmur heard. Pulmonary:     Effort: Pulmonary effort is normal. No respiratory distress.     Breath sounds: Normal breath sounds. No wheezing or rales.  Musculoskeletal:        General: No tenderness. Normal range of motion.     Cervical back: Normal range of motion and neck supple.  Right lower leg: No edema.     Left lower leg: No edema.  Skin:    General: Skin is warm and dry.     Findings: No erythema or rash.  Neurological:     Mental Status: He is alert and oriented to person, place, and time. Mental status is at baseline.  Psychiatric:        Behavior: Behavior normal.     ED Results / Procedures / Treatments   Labs (all labs ordered are listed, but only abnormal results are displayed) Labs Reviewed  CBC WITH DIFFERENTIAL/PLATELET  TYPE AND SCREEN    EKG None  Radiology CT ABDOMINAL MASS BIOPSY  Result Date: 11/04/2022 CLINICAL DATA:  Large periumbilical peritoneal abdominal mass and history of prior colon carcinoma. EXAM: CT GUIDED CORE BIOPSY OF ABDOMINAL MASS  ANESTHESIA/SEDATION: Moderate (conscious) sedation was employed during this procedure. A total of Versed 1.5 mg and Fentanyl 75 mcg was administered intravenously by radiology nursing. Moderate Sedation Time: 20 minutes. The patient's level of consciousness and vital signs were monitored continuously by radiology nursing throughout the procedure under my direct supervision. PROCEDURE: The procedure risks, benefits, and alternatives were explained to the patient. Questions regarding the procedure were encouraged and answered. The patient understands and consents to the procedure. A time-out was performed prior to initiating the procedure. CT was performed through the abdomen and pelvis in a supine position. The lower left anterior abdominal wall was prepped with chlorhexidine in a sterile fashion, and a sterile drape was applied covering the operative field. A sterile gown and sterile gloves were used for the procedure. Local anesthesia was provided with 1% Lidocaine. Under CT guidance, a 17 gauge trocar needle was advanced to the level of an anterior abdominal wall mass. After confirming needle tip position, 2 separate coaxial 18 gauge core biopsy samples were obtained and submitted in formalin. Additional CT was performed after outer needle removal. RADIATION DOSE REDUCTION: This exam was performed according to the departmental dose-optimization program which includes automated exposure control, adjustment of the mA and/or kV according to patient size and/or use of iterative reconstruction technique. COMPLICATIONS: None FINDINGS: Large anterior peritoneal mass centered around the region of the umbilicus was localized and measures approximately 12.5 cm in greatest diameter. Solid tissue was obtained. IMPRESSION: CT-guided core biopsy performed of large anterior peritoneal mass. Electronically Signed   By: Aletta Edouard M.D.   On: 11/04/2022 13:00    Procedures Procedures    Medications Ordered in  ED Medications - No data to display  ED Course/ Medical Decision Making/ A&P                           Medical Decision Making Amount and/or Complexity of Data Reviewed External Data Reviewed: notes.    Details: oncology   Pt with multiple medical problems and comorbidities and presenting today with a complaint that caries a high risk for morbidity and mortality.  Here today with complaints of anemia.  Patient actually has no complaints at all and was sent here after finding the low hemoglobin with IR.  Patient was having a new biopsy done on another abdominal mass with a prior history of colon cancer.  Concerned that the anemia is related to recurrent cancer.  Patient is not having any signs of excessive bleeding in his urine or stool.  He is not vomiting or having hematemesis.  He is well-appearing on exam.  He does not take any anticoagulation.  Discussing with the patient he would prefer not to have a blood transfusion due to a poor reaction to blood a few years ago when he was anemic.  Discussed with Dr. Gearldine Shown nurse practitioner with the cancer center and at this time feel that it is reasonable for patient to be discharged home given he is stable and this is most likely a slowly occurring process.  They will see him on Friday and he will be set up for an iron infusion.  Discussed this option with the patient and his wife and he would prefer this.  Discussed with him starting back on his oral iron supplement which he has in the car already and will resume this until seeing Dr. Benay Spice on Friday.         Final Clinical Impression(s) / ED Diagnoses Final diagnoses:  Anemia, unspecified type    Rx / DC Orders ED Discharge Orders     None         Blanchie Dessert, MD 11/04/22 1605

## 2022-11-04 NOTE — Telephone Encounter (Signed)
I spoke with Dr. Maryan Rued and forward the call to [facility] T

## 2022-11-04 NOTE — Procedures (Signed)
Interventional Radiology Procedure Note  Procedure: CT Guided Biopsy of abdominal mass  Complications: None  Estimated Blood Loss: < 10 mL  Findings: 18 G core biopsy of large abdominal mass performed under CT guidance.  Five core samples obtained and sent to Pathology.  Venetia Night. Kathlene Cote, M.D Pager:  [phone]

## 2022-11-04 NOTE — Progress Notes (Signed)
Lab work came back with low results. PA Allred informed Dr. Kathlene Cote.  Orders for repeat CBC.  Pt concerned about getting blood work again.   Will reassess to get repeat CBC upon completion of biopsy.   PA and MD aware.

## 2022-11-04 NOTE — ED Triage Notes (Signed)
Patient sent over from IR. Patient had a band aid to the left upper abdomen from his IR site. Before the procedure his hemoglobin was 5.4 and after his repeat hemoglobin was 4.5. Patient takes iron supplements.   20G left hand

## 2022-11-04 NOTE — ED Provider Triage Note (Signed)
Emergency Medicine Provider Triage Evaluation Note  Aaron Roth , a 77 y.o. male  was evaluated in triage.  Patient is under cancer treatment and was sent from IR due to hemoglobin of 4.5.  Patient denies any symptoms other than being irritable.  Says that his blood counts have been even lower than this.  He is requesting discharge however I have convinced him to stay at this time  Review of Systems  Positive:  Negative:   Physical Exam  There were no vitals taken for this visit. Gen:   Awake, no distress   Resp:  Normal effort  MSK:   Moves extremities without difficulty  Other:    Medical Decision Making  Medically screening exam initiated at 2:56 PM.  Appropriate orders placed.  [name] was informed that the remainder of the evaluation will be completed by another provider, this initial triage assessment does not replace that evaluation, and the importance of remaining in the ED until their evaluation is complete.  Patient had a hemoglobin of 5.4 at 1020 this morning and 4.5 at 1106.     Rhae Hammock, PA-C 11/04/22 1514

## 2022-11-04 NOTE — Progress Notes (Signed)
Informed patient and wife of Dr. Benay Spice recommendation to be taken to ED for further evaluation and possible blood transfusion.  All post IR instructions given to patient and wife prior to transport to ED.   Patient taken to ED via stretcher.   VSS denies pain.  IV continued in left hand- flushed and NS restarted at [facility].  Report given to triage RN- Buford Dresser RN

## 2022-11-04 NOTE — ED Notes (Signed)
Lab orders D/C

## 2022-11-04 NOTE — Telephone Encounter (Signed)
-----   Message from Owens Shark, NP sent at 11/04/2022  3:11 PM EST ----- Patient is apparently in the [facility] emergency department.  Please call the lab or ED at [facility] and ask them to add a reticulocyte count and ferritin per Dr. Benay Spice.

## 2022-11-05 ENCOUNTER — Inpatient Hospital Stay: Payer: Self-pay | Attending: Oncology

## 2022-11-05 ENCOUNTER — Telehealth: Payer: Self-pay

## 2022-11-05 ENCOUNTER — Inpatient Hospital Stay (HOSPITAL_BASED_OUTPATIENT_CLINIC_OR_DEPARTMENT_OTHER): Payer: Self-pay | Admitting: Nurse Practitioner

## 2022-11-05 ENCOUNTER — Encounter: Payer: Self-pay | Admitting: Nurse Practitioner

## 2022-11-05 ENCOUNTER — Other Ambulatory Visit: Payer: Self-pay | Admitting: *Deleted

## 2022-11-05 VITALS — BP 164/77 | HR 81 | Temp 98.1°F | Resp 18 | Ht 67.0 in | Wt 163.2 lb

## 2022-11-05 DIAGNOSIS — D509 Iron deficiency anemia, unspecified: Secondary | ICD-10-CM

## 2022-11-05 DIAGNOSIS — C182 Malignant neoplasm of ascending colon: Secondary | ICD-10-CM

## 2022-11-05 DIAGNOSIS — D649 Anemia, unspecified: Secondary | ICD-10-CM | POA: Insufficient documentation

## 2022-11-05 DIAGNOSIS — Z85038 Personal history of other malignant neoplasm of large intestine: Secondary | ICD-10-CM

## 2022-11-05 DIAGNOSIS — K7689 Other specified diseases of liver: Secondary | ICD-10-CM | POA: Insufficient documentation

## 2022-11-05 DIAGNOSIS — C7989 Secondary malignant neoplasm of other specified sites: Secondary | ICD-10-CM | POA: Insufficient documentation

## 2022-11-05 DIAGNOSIS — R918 Other nonspecific abnormal finding of lung field: Secondary | ICD-10-CM | POA: Insufficient documentation

## 2022-11-05 DIAGNOSIS — Z933 Colostomy status: Secondary | ICD-10-CM | POA: Insufficient documentation

## 2022-11-05 DIAGNOSIS — C180 Malignant neoplasm of cecum: Secondary | ICD-10-CM | POA: Insufficient documentation

## 2022-11-05 LAB — CBC WITH DIFFERENTIAL (CANCER CENTER ONLY)
Abs Immature Granulocytes: 0.05 10*3/uL (ref 0.00–0.07)
Basophils Absolute: 0 10*3/uL (ref 0.0–0.1)
Basophils Relative: 1 %
Eosinophils Absolute: 0.1 10*3/uL (ref 0.0–0.5)
Eosinophils Relative: 2 %
HCT: 20.3 % — ABNORMAL LOW (ref 39.0–52.0)
Hemoglobin: 5.1 g/dL — CL (ref 13.0–17.0)
Immature Granulocytes: 1 %
Lymphocytes Relative: 17 %
Lymphs Abs: 1.3 10*3/uL (ref 0.7–4.0)
MCH: 14.8 pg — ABNORMAL LOW (ref 26.0–34.0)
MCHC: 25.1 g/dL — ABNORMAL LOW (ref 30.0–36.0)
MCV: 58.8 fL — ABNORMAL LOW (ref 80.0–100.0)
Monocytes Absolute: 0.7 10*3/uL (ref 0.1–1.0)
Monocytes Relative: 9 %
Neutro Abs: 5.5 10*3/uL (ref 1.7–7.7)
Neutrophils Relative %: 70 %
Platelet Count: 644 10*3/uL — ABNORMAL HIGH (ref 150–400)
RBC: 3.45 MIL/uL — ABNORMAL LOW (ref 4.22–5.81)
RDW: 21.9 % — ABNORMAL HIGH (ref 11.5–15.5)
Smear Review: NORMAL
WBC Count: 7.7 10*3/uL (ref 4.0–10.5)
nRBC: 0 % (ref 0.0–0.2)

## 2022-11-05 LAB — RETIC PANEL
Immature Retic Fract: 20.8 % — ABNORMAL HIGH (ref 2.3–15.9)
RBC.: 3.47 MIL/uL — ABNORMAL LOW (ref 4.22–5.81)
Retic Count, Absolute: 45.8 10*3/uL (ref 19.0–186.0)
Retic Ct Pct: 1.3 % (ref 0.4–3.1)
Reticulocyte Hemoglobin: 12.3 pg — ABNORMAL LOW (ref >27.9)

## 2022-11-05 LAB — SAMPLE TO BLOOD BANK

## 2022-11-05 LAB — FERRITIN: Ferritin: 3 ng/mL — ABNORMAL LOW (ref 24–336)

## 2022-11-05 LAB — PREPARE RBC (CROSSMATCH)

## 2022-11-05 NOTE — Progress Notes (Signed)
CRITICAL VALUE STICKER  CRITICAL VALUE: Hgb 5.1  RECEIVER (on-site recipient of call):Kassem Kibbe,RN  DATE & TIME NOTIFIED: 11/05/22 @ 7  MESSENGER (representative from lab):Sunni  MD NOTIFIED: Ned Card, NP  TIME OF NOTIFICATION:1156  RESPONSE: Being seen in office at this time.

## 2022-11-05 NOTE — Telephone Encounter (Signed)
RBCS are order,dash has been called. [place] confirm the order in [facility] and received the blood slip.

## 2022-11-05 NOTE — Progress Notes (Signed)
[place] OFFICE PROGRESS NOTE   Diagnosis: Colon cancer, anemia  INTERVAL HISTORY:   Aaron Roth was last seen by Dr. Benay Spice on 06/22/2022.  He was referred for biopsy of an abdominal mass at that time.  He apparently canceled the biopsy procedure several times.  He underwent biopsy of the abdominal mass yesterday in Interventional Radiology.  Pathology is pending.  He had a CBC done yesterday which returned with a hemoglobin of 5.4, MCV 59.  Repeat CBC showed hemoglobin of 4.5.  He was referred to the emergency department.  He declined red cell transfusion support and admission.  The emergency department physician contacted our office and requested quick follow-up of the anemia.  Aaron Roth is seen today for further evaluation.  He denies shortness of breath.  He notes a slight decrease in his energy level coinciding with the colder weather.  He is not aware of any bleeding.  He denies black stools.  He denies maroon stools.  He has taken oral iron in the past and reports tolerating with no GI symptoms.   Objective:  Vital signs in last 24 hours:  Blood pressure (!) 164/77, pulse 81, temperature 98.1 F (36.7 C), temperature source Oral, resp. rate 18, height _0  (1.702 m), weight 163 lb 3.2 oz (74 kg), SpO2 100 %.    HEENT: No thrush or ulcers. Resp: Lungs clear bilaterally. Cardio: Regular rate and rhythm. GI: Large firm mass mid abdomen. Vascular: No leg edema.   Lab Results:  Lab Results  Component Value Date   WBC 5.6 11/04/2022   HGB 4.5 (LL) 11/04/2022   HCT 18.7 (L) 11/04/2022   MCV 62.3 (L) 11/04/2022   PLT 275 11/04/2022   NEUTROABS 3.8 11/04/2022    Imaging:  CT ABDOMINAL MASS BIOPSY  Result Date: 11/04/2022 CLINICAL DATA:  Large periumbilical peritoneal abdominal mass and history of prior colon carcinoma. EXAM: CT GUIDED CORE BIOPSY OF ABDOMINAL MASS ANESTHESIA/SEDATION: Moderate (conscious) sedation was employed during this procedure. A  total of Versed 1.5 mg and Fentanyl 75 mcg was administered intravenously by radiology nursing. Moderate Sedation Time: 20 minutes. The patient's level of consciousness and vital signs were monitored continuously by radiology nursing throughout the procedure under my direct supervision. PROCEDURE: The procedure risks, benefits, and alternatives were explained to the patient. Questions regarding the procedure were encouraged and answered. The patient understands and consents to the procedure. A time-out was performed prior to initiating the procedure. CT was performed through the abdomen and pelvis in a supine position. The lower left anterior abdominal wall was prepped with chlorhexidine in a sterile fashion, and a sterile drape was applied covering the operative field. A sterile gown and sterile gloves were used for the procedure. Local anesthesia was provided with 1% Lidocaine. Under CT guidance, a 17 gauge trocar needle was advanced to the level of an anterior abdominal wall mass. After confirming needle tip position, 2 separate coaxial 18 gauge core biopsy samples were obtained and submitted in formalin. Additional CT was performed after outer needle removal. RADIATION DOSE REDUCTION: This exam was performed according to the departmental dose-optimization program which includes automated exposure control, adjustment of the mA and/or kV according to patient size and/or use of iterative reconstruction technique. COMPLICATIONS: None FINDINGS: Large anterior peritoneal mass centered around the region of the umbilicus was localized and measures approximately 12.5 cm in greatest diameter. Solid tissue was obtained. IMPRESSION: CT-guided core biopsy performed of large anterior peritoneal mass. Electronically Signed   By:  Aletta Edouard M.D.   On: 11/04/2022 13:00    Medications: I have reviewed the patient's current medications.  Assessment/Plan: Colon cancer  CT abdomen/pelvis 08/02/2020-small amount of free  fluid in the pelvis, postsurgical change involving the left colon, trace right pleural fluid.   Colonoscopy 08/04/2020-cecal mass as well as a 3 mm polyp in the ascending colon, 9 mm polyp in the ascending colon, 3 mm polyp in the transverse colon and 4 small polyps in the transverse colon.  Biopsy of the cecal mass showed adenocarcinoma.  Pathology on the polyps showed tubular adenomas with no high-grade dysplasia or malignancy identified. CT chest 08/04/2020-no evidence of metastatic disease CEA 08/04/2020-2.0 Laparoscopic right colectomy 10/17/2020 by Dr. Dorothyann Gibbs showed adenocarcinoma with mucinous features, moderately differentiated, 6.7 cm; no carcinoma identified and 23 lymph nodes; margins uninvolved; no macroscopic tumor perforation identified; tumor invaded through muscularis propria into pericolorectal tissue; no lymphovascular invasion and no perineural invasion, pT3, pN0, mismatch repair protein IHC normal, MSI stable. 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 He completed cycle 1 CAPOX beginning 11/13/2011; cycle 7 beginning 03/23/2012. Cycle 8 was completed beginning on 04/13/2012 without oxaliplatin. Restaging CT scans 06/06/2012 without evidence of recurrent disease.   Family history-brother with colon cancer Multiple polyps on colonoscopy 08/04/2020   Disposition: Aaron Roth appears stable.  Repeat CBC today shows persistent severe anemia, microcytic.  Ferritin is 3.  He agrees to a blood transfusion.  He reports a  reaction to a blood transfusion in December 2021.  We reviewed the transfusion record from December 2021.  No reaction is noted.  We again reviewed the possibility of an allergic reaction with a blood transfusion, potentially severe.  We also discussed the infection risk.  He agrees to proceed.  He will return for 2 units of blood 11/06/2022.  He will begin ferrous sulfate 325 mg twice daily.  He will complete a set of stool cards.  He will return for lab and follow-up in 1 week, pathology results should be available at that time.  Patient seen with Dr. Benay Spice.    Ned Card ANP/GNP-BC   11/05/2022  11:32 AM  This was a shared visit with Ned Card.  Aaron Roth was interviewed and examined.  He underwent biopsy of the abdominal mass yesterday.  He was found to have severe iron deficiency anemia yesterday.  The anemia is potentially related to the abdominal mass.  He will begin iron therapy.  He will be scheduled for Red cell transfusion tomorrow.  We discussed the severity of the anemia with Mr which is and the importance of treating the anemia.  We will follow-up on the pathology from the abdominal biopsy and recommend further evaluation and treatment.  I was present for greater than 50% of today's visit.  I performed medical decision making.  Julieanne Manson, MD

## 2022-11-06 ENCOUNTER — Inpatient Hospital Stay: Payer: Self-pay

## 2022-11-06 DIAGNOSIS — Z85038 Personal history of other malignant neoplasm of large intestine: Secondary | ICD-10-CM

## 2022-11-06 MED ORDER — SODIUM CHLORIDE 0.9% IV SOLUTION
250.0000 mL | Freq: Once | INTRAVENOUS | Status: AC
  Administered 2022-11-06: 250 mL via INTRAVENOUS

## 2022-11-06 MED ORDER — ACETAMINOPHEN 325 MG PO TABS: 650.0000 mg | ORAL_TABLET | Freq: Once | ORAL | Status: DC

## 2022-11-06 MED ORDER — DIPHENHYDRAMINE HCL 25 MG PO CAPS
25.0000 mg | ORAL_CAPSULE | Freq: Once | ORAL | Status: AC
  Administered 2022-11-06: 25 mg via ORAL
  Filled 2022-11-06: qty 1

## 2022-11-06 NOTE — Patient Instructions (Signed)

## 2022-11-07 LAB — BPAM RBC
Blood Product Expiration Date: 202402022359
Blood Product Expiration Date: 202402022359
ISSUE DATE / TIME: 202401050723
ISSUE DATE / TIME: 202401050723
Unit Type and Rh: 5100
Unit Type and Rh: 5100

## 2022-11-07 LAB — TYPE AND SCREEN
ABO/RH(D): O POS
Antibody Screen: NEGATIVE
Unit division: 0
Unit division: 0

## 2022-11-11 ENCOUNTER — Ambulatory Visit: Payer: Self-pay | Admitting: Physician Assistant

## 2022-11-12 ENCOUNTER — Encounter: Payer: Self-pay | Admitting: *Deleted

## 2022-11-12 ENCOUNTER — Encounter: Payer: Self-pay | Admitting: Nurse Practitioner

## 2022-11-12 ENCOUNTER — Inpatient Hospital Stay (HOSPITAL_BASED_OUTPATIENT_CLINIC_OR_DEPARTMENT_OTHER): Payer: Self-pay | Admitting: Nurse Practitioner

## 2022-11-12 VITALS — BP 177/94 | HR 87 | Temp 98.1°F | Resp 18 | Ht 67.0 in | Wt 165.4 lb

## 2022-11-12 DIAGNOSIS — D509 Iron deficiency anemia, unspecified: Secondary | ICD-10-CM

## 2022-11-12 DIAGNOSIS — C182 Malignant neoplasm of ascending colon: Secondary | ICD-10-CM

## 2022-11-12 NOTE — Progress Notes (Signed)
[place] OFFICE PROGRESS NOTE   Diagnosis: Colon cancer, anemia  INTERVAL HISTORY:   Aaron Roth returns as scheduled.  He was transfused with 2 units of blood 11/06/2022.  He is seen today to review pathology results from the abdominal mass biopsy.  He overall feels better since the blood transfusion.  No bleeding.  He is taking oral iron.  No constipation or nausea.  Objective:  Vital signs in last 24 hours:  Blood pressure (!) 177/94, pulse 87, temperature 98.1 F (36.7 C), temperature source Oral, resp. rate 18, height '5\' 7"'$  (1.702 m), weight 165 lb 6.4 oz (75 kg), SpO2 100 %.    Resp: Lungs clear bilaterally. Cardio: Regular rate and rhythm. GI: Large palpable abdominal mass mid abdomen. Vascular: No leg edema.   Lab Results:  Lab Results  Component Value Date   WBC 7.7 11/05/2022   HGB 5.1 (LL) 11/05/2022   HCT 20.3 (L) 11/05/2022   MCV 58.8 (L) 11/05/2022   PLT 644 (H) 11/05/2022   NEUTROABS 5.5 11/05/2022    Imaging:  No results found.  Medications: I have reviewed the patient's current medications.  Assessment/Plan: Colon cancer  CT abdomen/pelvis 08/02/2020-small amount of free fluid in the pelvis, postsurgical change involving the left colon, trace right pleural fluid.   Colonoscopy 08/04/2020-cecal mass as well as a 3 mm polyp in the ascending colon, 9 mm polyp in the ascending colon, 3 mm polyp in the transverse colon and 4 small polyps in the transverse colon.  Biopsy of the cecal mass showed adenocarcinoma.  Pathology on the polyps showed tubular adenomas with no high-grade dysplasia or malignancy identified. CT chest 08/04/2020-no evidence of metastatic disease CEA 08/04/2020-2.0 Laparoscopic right colectomy 10/17/2020 by Dr. Dorothyann Gibbs showed adenocarcinoma with mucinous features, moderately differentiated, 6.7 cm; no carcinoma identified and 23 lymph nodes; margins uninvolved; no macroscopic tumor perforation identified; tumor invaded  through muscularis propria into pericolorectal tissue; no lymphovascular invasion and no perineural invasion, pT3, pN0, mismatch repair protein IHC normal, MSI stable. 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 He completed cycle 1 CAPOX beginning 11/13/2011; cycle 7 beginning 03/23/2012. Cycle 8 was completed beginning on 04/13/2012 without oxaliplatin. Restaging CT scans 06/06/2012 without evidence of recurrent disease.   Family history-brother with colon cancer Multiple polyps on colonoscopy 08/04/2020   Disposition: Aaron Roth appears stable.  We reviewed the pathology report from the recent abdominal mass biopsy with him and his son at today's visit.  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  We have requested foundation 1 testing.  We will obtain baseline labs to include CBC, chemistry panel and CEA.  We are referring him for restaging CT scans.  His case will be presented at an upcoming GI tumor conference.  He will return for follow-up in 2 weeks.  We are available to see him sooner if needed.  Patient seen with Dr. Benay Spice.    Ned Card ANP/GNP-BC   11/12/2022  11:52 AM  This was a shared visit with Ned Card.  Aaron Roth was interviewed  and examined.  We reviewed the August 2023 CT images with Aaron Roth and his son.  We discussed the biopsy findings.  He appears to have metastatic colon cancer involving an abdominal mass.  Mass  appears larger than in August.  We discussed potential treatment options including surgical resection.  He will be referred for restaging CTs and a repeat CBC.  I will present his case at the GI tumor conference.  We will submit the biopsy tissue for molecular testing and we will ask for direct comparison to the 2021 cecum tumor.  The tumor is most likely related to the 2021 cancer, but it is possible he has a late recurrence of the 2012 stage III colon cancer.  I was present for greater than 50% of today's visit.  I performed medical decision making.  Julieanne Manson, MD

## 2022-11-12 NOTE — Progress Notes (Signed)
Request sent for pathology for direct comparison of abdominal mass biopsy from 11/04/2022 to the pathology from colon surgery 10/17/2020.   And Foundation One testing ordered

## 2022-11-17 ENCOUNTER — Other Ambulatory Visit: Payer: Self-pay

## 2022-11-18 ENCOUNTER — Ambulatory Visit (HOSPITAL_BASED_OUTPATIENT_CLINIC_OR_DEPARTMENT_OTHER)
Admission: RE | Admit: 2022-11-18 | Discharge: 2022-11-18 | Disposition: A | Discharge status: Home / Self Care | Payer: Self-pay | Source: Ambulatory Visit | Attending: Nurse Practitioner | Admitting: Nurse Practitioner

## 2022-11-18 ENCOUNTER — Encounter (HOSPITAL_BASED_OUTPATIENT_CLINIC_OR_DEPARTMENT_OTHER): Payer: Self-pay

## 2022-11-18 ENCOUNTER — Inpatient Hospital Stay: Payer: Self-pay

## 2022-11-18 DIAGNOSIS — D509 Iron deficiency anemia, unspecified: Secondary | ICD-10-CM | POA: Insufficient documentation

## 2022-11-18 DIAGNOSIS — C182 Malignant neoplasm of ascending colon: Secondary | ICD-10-CM

## 2022-11-18 LAB — CBC WITH DIFFERENTIAL (CANCER CENTER ONLY)
Abs Immature Granulocytes: 0.03 10*3/uL (ref 0.00–0.07)
Basophils Absolute: 0 10*3/uL (ref 0.0–0.1)
Basophils Relative: 1 %
Eosinophils Absolute: 0.1 10*3/uL (ref 0.0–0.5)
Eosinophils Relative: 2 %
HCT: 29.4 % — ABNORMAL LOW (ref 39.0–52.0)
Hemoglobin: 8 g/dL — ABNORMAL LOW (ref 13.0–17.0)
Immature Granulocytes: 0 %
Lymphocytes Relative: 16 %
Lymphs Abs: 1.2 10*3/uL (ref 0.7–4.0)
MCH: 18.6 pg — ABNORMAL LOW (ref 26.0–34.0)
MCHC: 27.2 g/dL — ABNORMAL LOW (ref 30.0–36.0)
MCV: 68.2 fL — ABNORMAL LOW (ref 80.0–100.0)
Monocytes Absolute: 0.6 10*3/uL (ref 0.1–1.0)
Monocytes Relative: 8 %
Neutro Abs: 5.2 10*3/uL (ref 1.7–7.7)
Neutrophils Relative %: 73 %
Platelet Count: 631 10*3/uL — ABNORMAL HIGH (ref 150–400)
RBC: 4.31 MIL/uL (ref 4.22–5.81)
RDW: 31.1 % — ABNORMAL HIGH (ref 11.5–15.5)
WBC Count: 7.1 10*3/uL (ref 4.0–10.5)
nRBC: 0 % (ref 0.0–0.2)

## 2022-11-18 LAB — CMP (CANCER CENTER ONLY)
ALT: 6 U/L (ref 0–44)
AST: 11 U/L — ABNORMAL LOW (ref 15–41)
Albumin: 3.3 g/dL — ABNORMAL LOW (ref 3.5–5.0)
Alkaline Phosphatase: 70 U/L (ref 38–126)
Anion gap: 9 (ref 5–15)
BUN: 12 mg/dL (ref 8–23)
CO2: 27 mmol/L (ref 22–32)
Calcium: 8.6 mg/dL — ABNORMAL LOW (ref 8.9–10.3)
Chloride: 107 mmol/L (ref 98–111)
Creatinine: 0.9 mg/dL (ref 0.61–1.24)
GFR, Estimated: >60 mL/min (ref >60)
Glucose, Bld: 122 mg/dL — ABNORMAL HIGH (ref 70–99)
Potassium: 4.3 mmol/L (ref 3.5–5.1)
Sodium: 143 mmol/L (ref 135–145)
Total Bilirubin: 0.4 mg/dL (ref 0.3–1.2)
Total Protein: 6.3 g/dL — ABNORMAL LOW (ref 6.5–8.1)

## 2022-11-18 LAB — CEA (ACCESS): CEA (CHCC): 4.91 ng/mL (ref 0.00–5.00)

## 2022-11-18 LAB — SAMPLE TO BLOOD BANK

## 2022-11-18 MED ORDER — IOHEXOL 300 MG/ML  SOLN
100.0000 mL | Freq: Once | INTRAMUSCULAR | Status: AC | PRN
  Administered 2022-11-18: 80 mL via INTRAVENOUS

## 2022-11-18 NOTE — Progress Notes (Signed)
Patient to have ultrasound IV prior to CT scan- MW

## 2022-11-24 ENCOUNTER — Telehealth: Payer: Self-pay | Admitting: *Deleted

## 2022-11-25 ENCOUNTER — Other Ambulatory Visit: Payer: Self-pay

## 2022-11-25 LAB — SURGICAL PATHOLOGY

## 2022-11-25 NOTE — Progress Notes (Signed)
The proposed treatment discussed in conference is for discussion purpose only and is not a binding recommendation.  The patients have not been physically examined, or presented with their treatment options.  Therefore, final treatment plans cannot be decided.  

## 2022-11-26 ENCOUNTER — Other Ambulatory Visit (HOSPITAL_BASED_OUTPATIENT_CLINIC_OR_DEPARTMENT_OTHER): Payer: Self-pay

## 2022-11-26 ENCOUNTER — Encounter: Payer: Self-pay | Admitting: Nurse Practitioner

## 2022-11-26 ENCOUNTER — Inpatient Hospital Stay: Payer: Self-pay

## 2022-11-26 ENCOUNTER — Telehealth: Payer: Self-pay

## 2022-11-26 ENCOUNTER — Inpatient Hospital Stay (HOSPITAL_BASED_OUTPATIENT_CLINIC_OR_DEPARTMENT_OTHER): Payer: Self-pay | Admitting: Nurse Practitioner

## 2022-11-26 VITALS — BP 150/74 | HR 94 | Temp 98.2°F | Resp 18 | Ht 67.0 in | Wt 161.0 lb

## 2022-11-26 DIAGNOSIS — C182 Malignant neoplasm of ascending colon: Secondary | ICD-10-CM

## 2022-11-26 DIAGNOSIS — D509 Iron deficiency anemia, unspecified: Secondary | ICD-10-CM

## 2022-11-26 DIAGNOSIS — Z85038 Personal history of other malignant neoplasm of large intestine: Secondary | ICD-10-CM

## 2022-11-26 LAB — CBC WITH DIFFERENTIAL (CANCER CENTER ONLY)
Abs Immature Granulocytes: 0.02 10*3/uL (ref 0.00–0.07)
Basophils Absolute: 0.1 10*3/uL (ref 0.0–0.1)
Basophils Relative: 1 %
Eosinophils Absolute: 0.1 10*3/uL (ref 0.0–0.5)
Eosinophils Relative: 2 %
HCT: 26.7 % — ABNORMAL LOW (ref 39.0–52.0)
Hemoglobin: 7.4 g/dL — ABNORMAL LOW (ref 13.0–17.0)
Immature Granulocytes: 0 %
Lymphocytes Relative: 17 %
Lymphs Abs: 1.2 10*3/uL (ref 0.7–4.0)
MCH: 18.5 pg — ABNORMAL LOW (ref 26.0–34.0)
MCHC: 27.7 g/dL — ABNORMAL LOW (ref 30.0–36.0)
MCV: 66.9 fL — ABNORMAL LOW (ref 80.0–100.0)
Monocytes Absolute: 0.9 10*3/uL (ref 0.1–1.0)
Monocytes Relative: 12 %
Neutro Abs: 5 10*3/uL (ref 1.7–7.7)
Neutrophils Relative %: 68 %
Platelet Count: 637 10*3/uL — ABNORMAL HIGH (ref 150–400)
RBC: 3.99 MIL/uL — ABNORMAL LOW (ref 4.22–5.81)
RDW: 28.9 % — ABNORMAL HIGH (ref 11.5–15.5)
WBC Count: 7.2 10*3/uL (ref 4.0–10.5)
nRBC: 0 % (ref 0.0–0.2)

## 2022-11-26 LAB — SAMPLE TO BLOOD BANK

## 2022-11-26 LAB — OCCULT BLOOD X 1 CARD TO LAB, STOOL
Fecal Occult Bld: POSITIVE — AB
Fecal Occult Bld: POSITIVE — AB
Fecal Occult Bld: POSITIVE — AB

## 2022-11-26 NOTE — Telephone Encounter (Signed)
I placed a call to IR and spoke with Juliann Pulse she states the order is and Jonelle Sidle will be call the patient in the morning to be schedule.

## 2022-11-26 NOTE — Progress Notes (Signed)
[place] OFFICE PROGRESS NOTE   Diagnosis: Colon cancer, anemia  INTERVAL HISTORY:   [name] returns as scheduled.  He has no complaints.  He is taking oral iron intermittently.  He notes periodic loose stools which he thinks may be related to the iron but he also wonders if the loose stools could be due to drinking a "[place] tea".  He is not aware of any bleeding.  No nausea.  He denies pain.  Objective:  Vital signs in last 24 hours:  Blood pressure (!) 150/74, pulse 94, temperature 98.2 F (36.8 C), resp. rate 18, height '5\' 7"'$  (1.702 m), weight 161 lb (73 kg), SpO2 96 %.     Resp: Lungs clear bilaterally. Cardio: Regular rate and rhythm. GI: Large palpable mid abdomen mass. Vascular: No leg edema.  Lab Results:  Lab Results  Component Value Date   WBC 7.2 11/26/2022   HGB 7.4 (L) 11/26/2022   HCT 26.7 (L) 11/26/2022   MCV 66.9 (L) 11/26/2022   PLT 637 (H) 11/26/2022   NEUTROABS 5.0 11/26/2022    Imaging:  No results found.  Medications: I have reviewed the patient's current medications.  Assessment/Plan: Colon cancer  CT abdomen/pelvis 08/02/2020-small amount of free fluid in the pelvis, postsurgical change involving the left colon, trace right pleural fluid.   Colonoscopy 08/04/2020-cecal mass as well as a 3 mm polyp in the ascending colon, 9 mm polyp in the ascending colon, 3 mm polyp in the transverse colon and 4 small polyps in the transverse colon.  Biopsy of the cecal mass showed adenocarcinoma.  Pathology on the polyps showed tubular adenomas with no high-grade dysplasia or malignancy identified. CT chest 08/04/2020-no evidence of metastatic disease CEA 08/04/2020-2.0 Laparoscopic right colectomy 10/17/2020 by Dr. Dorothyann Gibbs showed adenocarcinoma with mucinous features, moderately differentiated, 6.7 cm; no carcinoma identified and 23 lymph nodes; margins uninvolved; no macroscopic tumor perforation identified; tumor invaded through  muscularis propria into pericolorectal tissue; no lymphovascular invasion and no perineural invasion, pT3, pN0, mismatch repair protein IHC normal, MSI stable. 06/18/2022-CT abdomen/pelvis-homogenous soft tissue mass in the anterior abdomen deep to the umbilicus, 5 mm left lower lobe nodule, new from October 2021 11/04/2022 biopsy of abdominal mass-metastatic mucinous adenocarcinoma, mismatch repair protein IHC normal, foundation 1 pending CTs 11/18/2022-large mass central lower abdomen is increased in size.  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  No evidence of obstruction.  Irregular lesions right lobe of the liver likely new, compatible with metastatic disease.  Increase size of a solid left lower lobe pulmonary nodule likely due to metastatic disease.  Solid pulmonary nodule left upper lobe increased in size likely due to indolent primary lung malignancy. 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 He completed cycle 1 CAPOX beginning 11/13/2011; cycle 7 beginning 03/23/2012. Cycle 8 was completed beginning on 04/13/2012 without oxaliplatin. Restaging CT scans 06/06/2012 without evidence of recurrent disease.   Family history-brother with colon cancer Multiple polyps on colonoscopy 08/04/2020   Disposition: Mr. Gripp appears unchanged.  His case  was reviewed at the GI tumor conference.  The pathology from the biopsy of the abdominal mass appears to be the same cancer from  2021.  The abdominal mass cannot be resected due to involvement of the abdominal wall and potential involvement with the small bowel.  The above was reviewed with Mr. Borner and his son at today's visit.  Recent CT report/images reviewed with Mr. Mittelman and his son as well.  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  They understand that no therapy will be curative.  We discussed a trial of systemic therapy on the FOLFIRI regimen.  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  We reviewed potential toxicities associated with chemotherapy including bone marrow toxicity, nausea, hair loss.  We discussed the potential for diarrhea with irinotecan.  He agrees to proceed.  He understands a Port-A-Cath is required for this regimen.  Referral placed for Port-A-Cath insertion.  He will attend a chemotherapy education class.  We reviewed the CBC from today.  He has progressive anemia.  He declines a blood transfusion.  He will try to take oral iron consistently.  Plan for repeat CBC in 1 week.  Transfusion support as needed.  He will return for follow-up and cycle 1 FOLFIRI in approximately 2 weeks.  We are available to see him sooner if needed.  Patient seen with Dr. Benay Spice.  Ned Card ANP/GNP-BC   11/26/2022  2:00 PM This was a shared visit with Ned Card.  We discussed the biopsy findings and GI tumor conference discussion with Mr. Sagan.  We reviewed the restaging CT findings and images with Mr. Salemi and his son.  He has metastatic colon cancer, most likely related to the 2021 tumor.  He does not appear to be a candidate for immunotherapy.  Mr. Riker understands no therapy will be curative.  The goal of palliative systemic therapy is to improve  symptoms and prolong survival.  He was  treated with 7 cycles of CAPOx in 2013 for the stage III colon cancer.  I do not recommend repeat treatment with oxaliplatin based therapy based on the cumulative dose to date and chance of an allergic reaction.  I think it would be difficult for Mr. Kubicek to tolerate CAPOX at present.  I recommend treatment with FOLFIRI.  He does not appear to be a candidate for bevacizumab based on the apparent fistula formation at the abdominal mass/small bowel.  We reviewed potential toxicities associated with the FOLFIRI regimen.  He agrees to proceed.  He will be referred for Port-A-Cath placement with the plan to begin 12/07/2022.  We are waiting on results from Foundation 1 testing on the abdominal mass biopsy.  I was present for greater than 50% of today's visit.  I performed medical decision making.  A chemotherapy plan was entered today.  Julieanne Manson, MD

## 2022-11-26 NOTE — Telephone Encounter (Signed)
-----  Message from Owens Shark, NP sent at 11/26/2022  2:55 PM EST ----- Refer to IR for Port-A-Cath placement

## 2022-11-26 NOTE — Progress Notes (Signed)
START ON PATHWAY REGIMEN - Colorectal     A cycle is every 14 days:     Irinotecan      Leucovorin      Fluorouracil      Fluorouracil   **Always confirm dose/schedule in your pharmacy ordering system**  Patient Characteristics: Distant Metastases, Nonsurgical Candidate, KRAS/NRAS Mutation Positive/Unknown (BRAF V600 Wild-Type/Unknown), Standard Cytotoxic Therapy, First Line Standard Cytotoxic Therapy, Bevacizumab Ineligible, PS = 0,1 Tumor Location: Colon Therapeutic Status: Distant Metastases Microsatellite/Mismatch Repair Status: MSS/pMMR BRAF Mutation Status: Awaiting Test Results KRAS/NRAS Mutation Status: Awaiting Test Results Preferred Therapy Approach: Standard Cytotoxic Therapy Standard Cytotoxic Line of Therapy: First Line Standard Cytotoxic Therapy ECOG Performance Status: 1 Bevacizumab Eligibility: Ineligible Intent of Therapy: Non-Curative / Palliative Intent, Discussed with Patient

## 2022-11-27 ENCOUNTER — Other Ambulatory Visit: Payer: Self-pay

## 2022-12-01 ENCOUNTER — Encounter (HOSPITAL_COMMUNITY): Payer: Self-pay | Admitting: Oncology

## 2022-12-01 NOTE — Consult Note (Addendum)
Chief Complaint: Chemotherapy access. Request is for portacath placement  Referring Physician(s): Owens Shark  Supervising Physician: Aletta Edouard  Patient Status: outpatient. 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 Colostomy. Team is requesting a portacath for chemotherapy access.  History of Present Illness: Aaron Roth is a 77 y.o. male outpatient. 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 Colostomy. Team is requesting a portacath for chemotherapy access.  Currently without any significant complaints. Patient alert and laying in bed,calm. Denies any fevers, headache, chest pain, SOB, cough, abdominal pain, nausea, vomiting or bleeding. Return precautions and treatment recommendations and follow-up discussed with the patient  who is agreeable with the plan.    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   Essential hypertension 08/10/2020   GERD (gastroesophageal reflux disease)    pt. denies    Past Surgical History:  Procedure Laterality Date   BIOPSY  08/04/2020   Procedure: BIOPSY;  Surgeon: [name], MD;  Location: [facility];  Service: Endoscopy;;  EGD and COLON   COLONOSCOPY  06/03/2012   Procedure: COLONOSCOPY;  Surgeon: [name], MD;  Location: [facility];  Service: General;  Laterality: N/A;   COLONOSCOPY WITH PROPOFOL N/A 08/04/2020   Procedure: COLONOSCOPY WITH PROPOFOL;  Surgeon: [name], MD;  Location: [facility];   Service: Endoscopy;  Laterality: N/A;   COLOSTOMY  09/25/2011   Procedure: COLOSTOMY;  Surgeon: [name], MD;  Location: [facility];  Service: General;  Laterality: N/A;   COLOSTOMY TAKEDOWN  07/14/2012   Procedure: LAPAROSCOPIC COLOSTOMY TAKEDOWN;  Surgeon: [name], MD;  Location: [facility];  Service: General;  Laterality: N/A;  laparoscopic coverted to open takedown of colostomy   ESOPHAGOGASTRODUODENOSCOPY (EGD) WITH PROPOFOL N/A 08/04/2020   Procedure: ESOPHAGOGASTRODUODENOSCOPY (EGD) WITH PROPOFOL;  Surgeon: [name], MD;  Location: [facility];  Service: Endoscopy;  Laterality: N/A;   GANGLION CYST EXCISION     wrist and foot   LAPAROSCOPIC PARTIAL COLECTOMY N/A 10/17/2020   Procedure: LAPAROSCOPIC RIGHT COLECTOMY;  Surgeon: [name], MD;  Location: [facility];  Service: General;  Laterality: N/A;   PARTIAL COLECTOMY  09/25/2011   Procedure: PARTIAL COLECTOMY;  Surgeon: [name], MD;  Location: [facility];  Service: General;;   POLYPECTOMY  08/04/2020   Procedure: POLYPECTOMY;  Surgeon: [name], MD;  Location: [facility];  Service: Endoscopy;;   PORT-A-CATH REMOVAL  07/14/2012   Procedure: REMOVAL PORT-A-CATH;  Surgeon: [name], MD;  Location: [facility];  Service: General;  Laterality: N/A;   PORTACATH PLACEMENT  11/10/2011   Procedure: INSERTION PORT-A-CATH;  Surgeon: [name], MD;  Location: [facility];  Service: General;  Laterality: Left;  left subclavian   PORTACATH PLACEMENT  11/10/2011    Allergies: Hydralazine and Other  Medications: Prior to Admission medications   Medication Sig Start Date End Date Taking? Authorizing Provider  amLODipine (NORVASC) 10 MG tablet TAKE 1 TABLET (10 MG TOTAL) BY MOUTH DAILY. 10/28/22 10/28/23  [name]  E, MD  carvedilol (COREG) 25 MG tablet Take 1 tablet (25 mg total) by mouth 2 (two) times daily with a meal. 10/28/22 01/26/23  Elsie Stain, MD  ferrous sulfate 325 (65 FE) MG tablet  TAKE 1 TABLET (325 MG TOTAL) BY MOUTH 2 (TWO) TIMES DAILY WITH A MEAL. 10/16/21 11/12/22  Elsie Stain, MD  Nutritional Supplements (IMMUNE ENHANCE) TABS Take 1 tablet by mouth daily.    [provider]  triamcinolone cream (KENALOG) 0.1 % Apply 1 application. topically 2 (two) times daily. 03/05/22   Elsie Stain, MD  valsartan-hydrochlorothiazide (DIOVAN-HCT) 320-25 MG tablet Take 1 tablet by mouth daily. 10/28/22   Elsie Stain, MD     Family History  Problem Relation Age of Onset   Bone cancer Brother    ALS Sister    Cancer Brother        bone, colon, testicular   Cancer Father        prostate    Social History   Socioeconomic History   Marital status: Married    Spouse name: Not on file   Number of children: Not on file   Years of education: Not on file   Highest education level: Not on file  Occupational History   Not on file  Tobacco Use   Smoking status: Former    Packs/day: 0.25    Years: 40.00    Total pack years: 10.00    Types: Cigarettes    Quit date: 07/12/2022    Years since quitting: 0.3   Smokeless tobacco: Never  Vaping Use   Vaping Use: Never used  Substance and Sexual Activity   Alcohol use: No   Drug use: No   Sexual activity: Not Currently  Other Topics Concern   Not on file  Social History Narrative   Not on file   Social Determinants of Health   Financial Resource Strain: Not on file  Food Insecurity: Not on file  Transportation Needs: Not on file  Physical Activity: Not on file  Stress: Not on file  Social Connections: Not on file     Review of Systems: A 12 point ROS discussed and pertinent positives are indicated in the HPI above.  All other systems are negative.  Review of Systems  Constitutional:  Negative for fever.  HENT:  Negative for congestion.   Respiratory:  Negative for cough and shortness of breath.   Cardiovascular:  Negative for chest pain.  Gastrointestinal:  Negative for abdominal pain.   Neurological:  Negative for headaches.  Psychiatric/Behavioral:  Negative for behavioral problems and confusion.     Vital Signs: BP (!) 187/103   Pulse 95   Temp (!) 97.3 F (36.3 C) (Temporal)   Resp 15   Wt 160 lb 15 oz (73 kg)   SpO2 100%   BMI 25.21 kg/m      Physical Exam Vitals and nursing note reviewed.  Constitutional:      Appearance: He is well-developed.  HENT:     Head: Normocephalic.  Cardiovascular:     Rate and Rhythm: Normal rate and regular rhythm.     Heart sounds: Normal heart sounds.  Pulmonary:     Effort: Pulmonary effort is normal.  Musculoskeletal:        General: Normal range of motion.     Cervical back: Normal range of motion.  Skin:    General: Skin is dry.  Neurological:     Mental Status: He is alert and  oriented to person, place, and time.     Imaging: CT CHEST ABDOMEN PELVIS W CONTRAST  Result Date: 11/18/2022 CLINICAL DATA:  Restage colon cancer; * Tracking Code: BO * EXAM: CT CHEST, ABDOMEN, AND PELVIS WITH CONTRAST TECHNIQUE: Multidetector CT imaging of the chest, abdomen and pelvis was performed following the standard protocol during bolus administration of intravenous contrast. RADIATION DOSE REDUCTION: This exam was performed according to the departmental dose-optimization program which includes automated exposure control, adjustment of the mA and/or kV according to patient size and/or use of iterative reconstruction technique. CONTRAST:  43m OMNIPAQUE IOHEXOL 300 MG/ML  SOLN COMPARISON:  CT abdomen and pelvis dated June 18, 2022; chest CT dated August 04, 2020 FINDINGS: CT CHEST FINDINGS Cardiovascular: Normal heart size. No pericardial effusion. Normal caliber thoracic aorta with mild atherosclerotic disease. Mediastinum/Nodes: Esophagus unremarkable. Enlarged thyroid gland, unchanged when compared with the prior exam. No pathologically enlarged lymph nodes seen in the chest Lungs/Pleura: Central airways are patent. Moderate  centrilobular emphysema. No consolidation, pleural effusion or pneumothorax. Solid pulmonary nodule of the left lower lobe measuring 7 mm on series 4, image 122, previously measured 5 mm. Spiculated solid pulmonary nodule of the left upper lobe measuring 9 x 7 mm on series 4, image 39, previously measured 4 mm. Stable solid pulmonary nodule of the left upper lobe measuring 5 mm on series 4, image 107, likely benign given greater than 1 year stability. Musculoskeletal: No chest wall mass or suspicious bone lesions identified. CT ABDOMEN PELVIS FINDINGS Hepatobiliary: Irregular lesions of the right lobe of the liver, not on prior prior exam, although lack of IV contrast limits evaluation. Reference lesion of the right hepatic lobe measuring 3.7 x 3.4 cm on series 2, image 64. Gallbladder is unremarkable. No biliary ductal dilation. Pancreas: Unremarkable. No pancreatic ductal dilatation or surrounding inflammatory changes. Spleen: Normal in size without focal abnormality. Adrenals/Urinary Tract: Bilateral adrenal glands are unremarkable. No hydronephrosis or nephrolithiasis. No suspicious renal lesions. Bladder is unremarkable. Stomach/Bowel: Prior partial right hemicolectomy. Mass of the central lower abdomen which encases increased in size measuring 12.0 x 8.7 cm, previously measured 10.7 x 6.7 cm. 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 Mass invades into the anterior abdominal wall, best appreciated on series 2, image 86. No evidence of obstruction. Vascular/Lymphatic: No significant vascular findings are present. No enlarged abdominal or pelvic lymph nodes. Reproductive: Prostatomegaly. Other: Trace pelvic ascites. Musculoskeletal: No acute or significant osseous findings. IMPRESSION: 1. Large mass of the central lower abdomen is increased in size when compared with prior exam. 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 No evidence of obstruction. 2. Irregular lesions of the right lobe of the liver, likely new when compared with the prior exam, although lack of IV contrast limits evaluation. Findings are compatible with metastatic disease. 3. Increased size of solid left lower lobe pulmonary nodule, likely due to likely due to metastatic disease. 4. Solid pulmonary nodule of the left upper lobe is increased in size when compared with August 04, 2020 prior, likely due to indolent primary lung malignancy. 5. Aortic Atherosclerosis (ICD10-I70.0) and Emphysema (ICD10-J43.9). Electronically Signed   By: LYetta GlassmanM.D.   On: 11/18/2022 21:13   CT ABDOMINAL MASS BIOPSY  Result Date: 11/04/2022 CLINICAL DATA:  Large periumbilical peritoneal  abdominal mass and history of prior colon carcinoma. EXAM: CT GUIDED CORE BIOPSY OF ABDOMINAL MASS ANESTHESIA/SEDATION: Moderate (conscious) sedation was employed during this procedure. A total of Versed 1.5 mg and Fentanyl 75 mcg was administered intravenously by radiology nursing. Moderate Sedation Time: 20 minutes. The patient's level of consciousness and vital signs were monitored continuously by radiology nursing throughout the procedure under my direct supervision. PROCEDURE: The procedure risks, benefits, and alternatives were explained to the patient. Questions regarding the procedure were encouraged and answered. The patient understands and consents to the procedure. A time-out was performed prior to initiating the procedure. CT was performed through the abdomen and pelvis in a supine position. The lower left anterior abdominal wall was prepped with chlorhexidine in a sterile fashion, and a sterile drape was applied covering the operative field. A sterile gown and sterile gloves were used for the procedure. Local anesthesia was provided with 1% Lidocaine.  Under CT guidance, a 17 gauge trocar needle was advanced to the level of an anterior abdominal wall mass. After confirming needle tip position, 2 separate coaxial 18 gauge core biopsy samples were obtained and submitted in formalin. Additional CT was performed after outer needle removal. RADIATION DOSE REDUCTION: This exam was performed according to the departmental dose-optimization program which includes automated exposure control, adjustment of the mA and/or kV according to patient size and/or use of iterative reconstruction technique. COMPLICATIONS: None FINDINGS: Large anterior peritoneal mass centered around the region of the umbilicus was localized and measures approximately 12.5 cm in greatest diameter. Solid tissue was obtained. IMPRESSION: CT-guided core biopsy performed of large anterior peritoneal mass. Electronically Signed   By: Aletta Edouard M.D.   On: 11/04/2022 13:00    Labs:  CBC: Recent Labs    11/05/22 1113 11/18/22 1200 11/26/22 1305 12/03/22 1343  WBC 7.7 7.1 7.2 8.8  HGB 5.1* 8.0* 7.4* 7.9*  HCT 20.3* 29.4* 26.7* 29.2*  PLT 644* 631* 637* 623*    COAGS: Recent Labs    11/04/22 1020  INR 1.2    BMP: Recent Labs    06/02/22 1235 11/04/22 1020 11/18/22 1200  NA 140 135 143  K 4.0 3.5 4.3  CL 104 101 107  CO2 '29 26 27  '$ GLUCOSE 87 88 122*  BUN '9 14 12  '$ CALCIUM 8.7* 8.2* 8.6*  CREATININE 0.95 0.95 0.90  GFRNONAA >60 >60 >60    LIVER FUNCTION TESTS: Recent Labs    06/02/22 1235 11/18/22 1200  BILITOT 0.3 0.4  AST 9* 11*  ALT 6 6  ALKPHOS 65 70  PROT 6.8 6.3*  ALBUMIN 4.0 3.3*    TUMOR MARKERS: Recent Labs    06/02/22 1235 11/18/22 1200  CEA 3.39 4.91    Assessment and Plan:  77 y.o. male outpatient. 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 Colostomy. Team is requesting a portacath for chemotherapy access.  Prior left subclavian line placed by general  surgery in 2013 since removed.  CT CAP from 1.17.24 shows RIJ is accessible. Labs from 1.17.24 show Hgb 7.4, Total protein 6.3. All medications are within acceptable parameters. No pertinent allergies. Patient has been NPO since midnight.   Risks and benefits of image guided port-a-catheter placement was discussed with the patient including, but not limited to bleeding, infection, pneumothorax, or fibrin sheath development and need for additional procedures.  All of the patient's questions were answered, patient is agreeable to proceed. Consent signed and in chart.  Thank you for this interesting consult.  I greatly enjoyed meeting Aaron Roth and look forward to participating in their care.  A copy of this report was sent to the requesting provider on this date.  Electronically Signed: Jacqualine Mau, NP 12/04/2022, 11:00 AM   I spent a total of  30 Minutes   in face to face in clinical consultation, greater than 50% of which was counseling/coordinating care for portacath placement

## 2022-12-02 ENCOUNTER — Other Ambulatory Visit: Payer: Self-pay | Admitting: Radiology

## 2022-12-03 ENCOUNTER — Other Ambulatory Visit: Payer: Self-pay | Admitting: *Deleted

## 2022-12-03 ENCOUNTER — Inpatient Hospital Stay: Payer: Self-pay | Attending: Oncology

## 2022-12-03 ENCOUNTER — Inpatient Hospital Stay: Payer: Self-pay

## 2022-12-03 ENCOUNTER — Telehealth: Payer: Self-pay

## 2022-12-03 DIAGNOSIS — D509 Iron deficiency anemia, unspecified: Secondary | ICD-10-CM

## 2022-12-03 DIAGNOSIS — Z800 Family history of malignant neoplasm of digestive organs: Secondary | ICD-10-CM | POA: Insufficient documentation

## 2022-12-03 DIAGNOSIS — C7989 Secondary malignant neoplasm of other specified sites: Secondary | ICD-10-CM | POA: Insufficient documentation

## 2022-12-03 DIAGNOSIS — D709 Neutropenia, unspecified: Secondary | ICD-10-CM | POA: Insufficient documentation

## 2022-12-03 DIAGNOSIS — Z5111 Encounter for antineoplastic chemotherapy: Secondary | ICD-10-CM | POA: Insufficient documentation

## 2022-12-03 DIAGNOSIS — D649 Anemia, unspecified: Secondary | ICD-10-CM | POA: Insufficient documentation

## 2022-12-03 DIAGNOSIS — Z85038 Personal history of other malignant neoplasm of large intestine: Secondary | ICD-10-CM

## 2022-12-03 DIAGNOSIS — C180 Malignant neoplasm of cecum: Secondary | ICD-10-CM | POA: Insufficient documentation

## 2022-12-03 DIAGNOSIS — K921 Melena: Secondary | ICD-10-CM | POA: Insufficient documentation

## 2022-12-03 DIAGNOSIS — C182 Malignant neoplasm of ascending colon: Secondary | ICD-10-CM

## 2022-12-03 DIAGNOSIS — Z933 Colostomy status: Secondary | ICD-10-CM | POA: Insufficient documentation

## 2022-12-03 DIAGNOSIS — R319 Hematuria, unspecified: Secondary | ICD-10-CM | POA: Insufficient documentation

## 2022-12-03 LAB — CBC WITH DIFFERENTIAL (CANCER CENTER ONLY)
Abs Immature Granulocytes: 0.04 10*3/uL (ref 0.00–0.07)
Basophils Absolute: 0.1 10*3/uL (ref 0.0–0.1)
Basophils Relative: 1 %
Eosinophils Absolute: 0.2 10*3/uL (ref 0.0–0.5)
Eosinophils Relative: 2 %
HCT: 29.2 % — ABNORMAL LOW (ref 39.0–52.0)
Hemoglobin: 7.9 g/dL — ABNORMAL LOW (ref 13.0–17.0)
Immature Granulocytes: 1 %
Lymphocytes Relative: 21 %
Lymphs Abs: 1.9 10*3/uL (ref 0.7–4.0)
MCH: 18.5 pg — ABNORMAL LOW (ref 26.0–34.0)
MCHC: 27.1 g/dL — ABNORMAL LOW (ref 30.0–36.0)
MCV: 68.2 fL — ABNORMAL LOW (ref 80.0–100.0)
Monocytes Absolute: 0.9 10*3/uL (ref 0.1–1.0)
Monocytes Relative: 10 %
Neutro Abs: 5.8 10*3/uL (ref 1.7–7.7)
Neutrophils Relative %: 65 %
Platelet Count: 623 10*3/uL — ABNORMAL HIGH (ref 150–400)
RBC: 4.28 MIL/uL (ref 4.22–5.81)
RDW: 29.1 % — ABNORMAL HIGH (ref 11.5–15.5)
WBC Count: 8.8 10*3/uL (ref 4.0–10.5)
nRBC: 0 % (ref 0.0–0.2)

## 2022-12-03 LAB — SAMPLE TO BLOOD BANK

## 2022-12-03 NOTE — Progress Notes (Signed)
Patient declined transfusion at this time. No symptoms. Agrees to recheck on Monday.

## 2022-12-03 NOTE — Telephone Encounter (Signed)
Patient's Hgb 7.9 today. Patient denies symptoms of SOB, dizziness, or increased tiredness. Dr. Benay Spice made aware.  Per Dr. Benay Spice Hgb will be rechecked at his next appointment with our office on 12/07/22.  Patient made aware of Dr. Gearldine Shown response and verbalized understanding.

## 2022-12-04 ENCOUNTER — Other Ambulatory Visit: Payer: Self-pay

## 2022-12-04 ENCOUNTER — Inpatient Hospital Stay: Payer: Self-pay

## 2022-12-04 ENCOUNTER — Ambulatory Visit (HOSPITAL_COMMUNITY)
Admission: RE | Admit: 2022-12-04 | Discharge: 2022-12-04 | Disposition: A | Discharge status: Home / Self Care | Payer: Self-pay | Source: Ambulatory Visit | Attending: Nurse Practitioner | Admitting: Nurse Practitioner

## 2022-12-04 DIAGNOSIS — C182 Malignant neoplasm of ascending colon: Secondary | ICD-10-CM | POA: Insufficient documentation

## 2022-12-04 DIAGNOSIS — D509 Iron deficiency anemia, unspecified: Secondary | ICD-10-CM | POA: Insufficient documentation

## 2022-12-04 DIAGNOSIS — Z87891 Personal history of nicotine dependence: Secondary | ICD-10-CM | POA: Insufficient documentation

## 2022-12-04 DIAGNOSIS — I10 Essential (primary) hypertension: Secondary | ICD-10-CM | POA: Insufficient documentation

## 2022-12-04 DIAGNOSIS — K219 Gastro-esophageal reflux disease without esophagitis: Secondary | ICD-10-CM | POA: Insufficient documentation

## 2022-12-04 DIAGNOSIS — Z85038 Personal history of other malignant neoplasm of large intestine: Secondary | ICD-10-CM

## 2022-12-04 HISTORY — PX: IR IMAGING GUIDED PORT INSERTION: IMG5740

## 2022-12-04 MED ORDER — MIDAZOLAM HCL 2 MG/2ML IJ SOLN
INTRAMUSCULAR | Status: AC
  Filled 2022-12-04: qty 2

## 2022-12-04 MED ORDER — SODIUM CHLORIDE 0.9 % IV SOLN: INTRAVENOUS | Status: DC

## 2022-12-04 MED ORDER — FENTANYL CITRATE (PF) 100 MCG/2ML IJ SOLN
INTRAMUSCULAR | Status: AC | PRN
  Administered 2022-12-04: 25 ug via INTRAVENOUS

## 2022-12-04 MED ORDER — LIDOCAINE HCL 1 % IJ SOLN
INTRAMUSCULAR | Status: AC
  Administered 2022-12-04: 20 mL
  Filled 2022-12-04: qty 20

## 2022-12-04 MED ORDER — MIDAZOLAM HCL 2 MG/2ML IJ SOLN
INTRAMUSCULAR | Status: AC | PRN
  Administered 2022-12-04: .5 mg via INTRAVENOUS

## 2022-12-04 MED ORDER — FENTANYL CITRATE (PF) 100 MCG/2ML IJ SOLN
INTRAMUSCULAR | Status: AC
  Filled 2022-12-04: qty 2

## 2022-12-04 MED ORDER — HEPARIN SOD (PORK) LOCK FLUSH 100 UNIT/ML IV SOLN
INTRAVENOUS | Status: AC
  Administered 2022-12-04: 500 [IU]
  Filled 2022-12-04: qty 5

## 2022-12-04 NOTE — Procedures (Signed)
Interventional Radiology Procedure Note  Procedure: Placement of a right IJ approach single lumen PowerPort.  Tip is positioned at the superior cavoatrial junction and catheter is ready for immediate use.  Complications: None Recommendations:  - Ok to shower tomorrow - Do not submerge for 7 days - Routine line care   Signed,  Ruthella Kirchman S. Murel Wigle, DO   

## 2022-12-04 NOTE — Sedation Documentation (Signed)
Patient states is sensitive to sedation medication and gets very agitated if he has too much. Dr Earleen Newport aware of this.

## 2022-12-06 ENCOUNTER — Other Ambulatory Visit: Payer: Self-pay | Admitting: Oncology

## 2022-12-07 ENCOUNTER — Inpatient Hospital Stay: Payer: Self-pay

## 2022-12-07 ENCOUNTER — Encounter: Payer: Self-pay | Admitting: Nurse Practitioner

## 2022-12-07 ENCOUNTER — Inpatient Hospital Stay (HOSPITAL_BASED_OUTPATIENT_CLINIC_OR_DEPARTMENT_OTHER): Payer: Self-pay | Admitting: Nurse Practitioner

## 2022-12-07 ENCOUNTER — Encounter: Payer: Self-pay | Admitting: Oncology

## 2022-12-07 ENCOUNTER — Other Ambulatory Visit: Payer: Self-pay

## 2022-12-07 VITALS — BP 163/74 | HR 79

## 2022-12-07 VITALS — BP 164/81 | HR 84 | Temp 97.9°F | Resp 18 | Ht 67.0 in | Wt 155.6 lb

## 2022-12-07 DIAGNOSIS — C182 Malignant neoplasm of ascending colon: Secondary | ICD-10-CM

## 2022-12-07 DIAGNOSIS — Z85038 Personal history of other malignant neoplasm of large intestine: Secondary | ICD-10-CM

## 2022-12-07 DIAGNOSIS — D509 Iron deficiency anemia, unspecified: Secondary | ICD-10-CM

## 2022-12-07 LAB — CMP (CANCER CENTER ONLY)
ALT: 7 U/L (ref 0–44)
AST: 10 U/L — ABNORMAL LOW (ref 15–41)
Albumin: 3.2 g/dL — ABNORMAL LOW (ref 3.5–5.0)
Alkaline Phosphatase: 62 U/L (ref 38–126)
Anion gap: 8 (ref 5–15)
BUN: 17 mg/dL (ref 8–23)
CO2: 26 mmol/L (ref 22–32)
Calcium: 8.5 mg/dL — ABNORMAL LOW (ref 8.9–10.3)
Chloride: 102 mmol/L (ref 98–111)
Creatinine: 0.81 mg/dL (ref 0.61–1.24)
GFR, Estimated: >60 mL/min (ref >60)
Glucose, Bld: 86 mg/dL (ref 70–99)
Potassium: 3.8 mmol/L (ref 3.5–5.1)
Sodium: 136 mmol/L (ref 135–145)
Total Bilirubin: 0.3 mg/dL (ref 0.3–1.2)
Total Protein: 6.5 g/dL (ref 6.5–8.1)

## 2022-12-07 LAB — CBC WITH DIFFERENTIAL (CANCER CENTER ONLY)
Abs Immature Granulocytes: 0.05 10*3/uL (ref 0.00–0.07)
Basophils Absolute: 0 10*3/uL (ref 0.0–0.1)
Basophils Relative: 1 %
Eosinophils Absolute: 0.2 10*3/uL (ref 0.0–0.5)
Eosinophils Relative: 2 %
HCT: 26.3 % — ABNORMAL LOW (ref 39.0–52.0)
Hemoglobin: 7.4 g/dL — ABNORMAL LOW (ref 13.0–17.0)
Immature Granulocytes: 1 %
Lymphocytes Relative: 15 %
Lymphs Abs: 1.1 10*3/uL (ref 0.7–4.0)
MCH: 18.8 pg — ABNORMAL LOW (ref 26.0–34.0)
MCHC: 28.1 g/dL — ABNORMAL LOW (ref 30.0–36.0)
MCV: 66.8 fL — ABNORMAL LOW (ref 80.0–100.0)
Monocytes Absolute: 0.6 10*3/uL (ref 0.1–1.0)
Monocytes Relative: 8 %
Neutro Abs: 5.3 10*3/uL (ref 1.7–7.7)
Neutrophils Relative %: 73 %
Platelet Count: 443 10*3/uL — ABNORMAL HIGH (ref 150–400)
RBC: 3.94 MIL/uL — ABNORMAL LOW (ref 4.22–5.81)
RDW: 27.9 % — ABNORMAL HIGH (ref 11.5–15.5)
WBC Count: 7.1 10*3/uL (ref 4.0–10.5)
nRBC: 0 % (ref 0.0–0.2)

## 2022-12-07 LAB — SAMPLE TO BLOOD BANK

## 2022-12-07 LAB — CEA (ACCESS): CEA (CHCC): 5.12 ng/mL — ABNORMAL HIGH (ref 0.00–5.00)

## 2022-12-07 MED ORDER — ATROPINE SULFATE 1 MG/ML IV SOLN
0.5000 mg | Freq: Once | INTRAVENOUS | Status: DC | PRN
  Filled 2022-12-07: qty 1

## 2022-12-07 MED ORDER — SODIUM CHLORIDE 0.9 % IV SOLN
180.0000 mg/m2 | Freq: Once | INTRAVENOUS | Status: AC
  Administered 2022-12-07: 340 mg via INTRAVENOUS
  Filled 2022-12-07: qty 15

## 2022-12-07 MED ORDER — SODIUM CHLORIDE 0.9 % IV SOLN: Freq: Once | INTRAVENOUS | Status: AC

## 2022-12-07 MED ORDER — LIDOCAINE-PRILOCAINE 2.5-2.5 % EX CREA
TOPICAL_CREAM | CUTANEOUS | 2 refills | Status: DC
  Filled 2022-12-07: qty 30, 15d supply, fill #0

## 2022-12-07 MED ORDER — SODIUM CHLORIDE 0.9 % IV SOLN
400.0000 mg/m2 | Freq: Once | INTRAVENOUS | Status: AC
  Administered 2022-12-07: 744 mg via INTRAVENOUS
  Filled 2022-12-07: qty 37.2

## 2022-12-07 MED ORDER — PALONOSETRON HCL INJECTION 0.25 MG/5ML
0.2500 mg | Freq: Once | INTRAVENOUS | Status: AC
  Administered 2022-12-07: 0.25 mg via INTRAVENOUS
  Filled 2022-12-07: qty 5

## 2022-12-07 MED ORDER — SODIUM CHLORIDE 0.9 % IV SOLN
10.0000 mg | Freq: Once | INTRAVENOUS | Status: AC
  Administered 2022-12-07: 10 mg via INTRAVENOUS
  Filled 2022-12-07: qty 10

## 2022-12-07 MED ORDER — SODIUM CHLORIDE 0.9 % IV SOLN
2000.0000 mg/m2 | INTRAVENOUS | Status: DC
  Administered 2022-12-07: 3500 mg via INTRAVENOUS
  Filled 2022-12-07: qty 70

## 2022-12-07 MED ORDER — PROCHLORPERAZINE MALEATE 10 MG PO TABS
10.0000 mg | ORAL_TABLET | Freq: Four times a day (QID) | ORAL | 3 refills | Status: DC | PRN
  Filled 2022-12-07: qty 30, 8d supply, fill #0
  Filled 2023-04-08: qty 30, 8d supply, fill #1

## 2022-12-07 MED ORDER — FLUOROURACIL CHEMO INJECTION 2.5 GM/50ML
400.0000 mg/m2 | Freq: Once | INTRAVENOUS | Status: AC
  Administered 2022-12-07: 750 mg via INTRAVENOUS
  Filled 2022-12-07: qty 15

## 2022-12-07 NOTE — Patient Instructions (Addendum)
[place]  The chemotherapy medication bag should finish at 46 hours, 96 hours, or 7 days. For example, if your pump is scheduled for 46 hours and it was put on at 4:00 p.m., it should finish at 2:00 p.m. the day it is scheduled to come off regardless of your appointment time.     Estimated time to finish at 10:45  Wednesday, December 09, 2022.   If the display on your pump reads "Low Volume" and it is beeping, take the batteries out of the pump and come to the cancer center for it to be taken off.   If the pump alarms go off prior to the pump reading "Low Volume" then call [phone] and someone can assist you.  If the plunger comes out and the chemotherapy medication is leaking out, please use your home chemo spill kit to clean up the spill. Do NOT use paper towels or other household products.  If you have problems or questions regarding your pump, please call either [phone] (24 hours a day) or the cancer center Monday-Friday 8:00 a.m.- 4:30 p.m. at the clinic number and we will assist you. If you are unable to get assistance, then go to the nearest Emergency Department and ask the staff to contact the IV team for assistance.   Discharge Instructions: Thank you for choosing [facility] to provide your oncology and hematology care.   If you have a lab appointment with the [facility], please go directly to the [facility] and check in at the registration area.   Wear comfortable clothing and clothing appropriate for easy access to any Portacath or PICC line.   We strive to give you quality time with your provider. You may need to reschedule your appointment if you arrive late (15 or more minutes).  Arriving late affects you and other patients whose appointments are after yours.  Also, if you miss three or more appointments without notifying the office, you may be dismissed from the clinic at the provider's discretion.      For  prescription refill requests, have your pharmacy contact our office and allow 72 hours for refills to be completed.    Today you received the following chemotherapy and/or immunotherapy agents Irinotecan, Leucovorin, Flurouracil      To help prevent nausea and vomiting after your treatment, we encourage you to take your nausea medication as directed.  BELOW ARE SYMPTOMS THAT SHOULD BE REPORTED IMMEDIATELY: *FEVER GREATER THAN 100.4 F (38 C) OR HIGHER *CHILLS OR SWEATING *NAUSEA AND VOMITING THAT IS NOT CONTROLLED WITH YOUR NAUSEA MEDICATION *UNUSUAL SHORTNESS OF BREATH *UNUSUAL BRUISING OR BLEEDING *URINARY PROBLEMS (pain or burning when urinating, or frequent urination) *BOWEL PROBLEMS (unusual diarrhea, constipation, pain near the anus) TENDERNESS IN MOUTH AND THROAT WITH OR WITHOUT PRESENCE OF ULCERS (sore throat, sores in mouth, or a toothache) UNUSUAL RASH, SWELLING OR PAIN  UNUSUAL VAGINAL DISCHARGE OR ITCHING   Items with * indicate a potential emergency and should be followed up as soon as possible or go to the Emergency Department if any problems should occur.  Please show the CHEMOTHERAPY ALERT CARD or IMMUNOTHERAPY ALERT CARD at check-in to the Emergency Department and triage nurse.  Should you have questions after your visit or need to cancel or reschedule your appointment, please contact [facility]  Dept: [phone]  and follow the prompts.  Office hours are 8:00 a.m. to 4:30 p.m. Monday - Friday. Please  note that voicemails left after 4:00 p.m. may not be returned until the following business day.  We are closed weekends and major holidays. You have access to a nurse at all times for urgent questions. Please call the main number to the clinic Dept: [phone] and follow the prompts.   For any non-urgent questions, you may also contact your provider using MyChart. We now offer e-Visits for anyone 38 and older to request care online for  non-urgent symptoms. For details visit [email or web address].   Also download the MyChart app! Go to the app store, search "MyChart", open the app, select [facility], and log in with your MyChart username and password.  Irinotecan Injection What is this medication? IRINOTECAN (ir in oh TEE kan) treats some types of cancer. It works by slowing down the growth of cancer cells. This medicine may be used for other purposes; ask your health care provider or pharmacist if you have questions. COMMON BRAND NAME(S): Camptosar What should I tell my care team before I take this medication? They need to know if you have any of these conditions: Dehydration Diarrhea Infection, especially a viral infection, such as chickenpox, cold sores, herpes Liver disease Low blood cell levels (white cells, red cells, and platelets) Low levels of electrolytes, such as calcium, magnesium, or potassium in your blood Recent or ongoing radiation An unusual or allergic reaction to irinotecan, other medications, foods, dyes, or preservatives If you or your partner are pregnant or trying to get pregnant Breast-feeding How should I use this medication? This medication is injected into a vein. It is given by your care team in a hospital or clinic setting. Talk to your care team about the use of this medication in children. Special care may be needed. Overdosage: If you think you have taken too much of this medicine contact a poison control center or emergency room at once. NOTE: This medicine is only for you. Do not share this medicine with others. What if I miss a dose? Keep appointments for follow-up doses. It is important not to miss your dose. Call your care team if you are unable to keep an appointment. What may interact with this medication? Do not take this medication with any of the following: Cobicistat Itraconazole This medication may also interact with the following: Certain antibiotics, such as  clarithromycin, rifampin, rifabutin Certain antivirals for HIV or AIDS Certain medications for fungal infections, such as ketoconazole, posaconazole, voriconazole Certain medications for seizures, such as carbamazepine, phenobarbital, phenytoin Gemfibrozil Nefazodone St. John's wort This list may not describe all possible interactions. Give your health care provider a list of all the medicines, herbs, non-prescription drugs, or dietary supplements you use. Also tell them if you smoke, drink alcohol, or use illegal drugs. Some items may interact with your medicine. What should I watch for while using this medication? Your condition will be monitored carefully while you are receiving this medication. You may need blood work while taking this medication. This medication may make you feel generally unwell. This is not uncommon as chemotherapy can affect healthy cells as well as cancer cells. Report any side effects. Continue your course of treatment even though you feel ill unless your care team tells you to stop. This medication can cause serious side effects. To reduce the risk, your care team may give you other medications to take before receiving this one. Be sure to follow the directions from your care team. This medication may affect your coordination, reaction time, or judgement. Do not drive  or operate machinery until you know how this medication affects you. Sit up or stand slowly to reduce the risk of dizzy or fainting spells. Drinking alcohol with this medication can increase the risk of these side effects. This medication may increase your risk of getting an infection. Call your care team for advice if you get a fever, chills, sore throat, or other symptoms of a cold or flu. Do not treat yourself. Try to avoid being around people who are sick. Avoid taking medications that contain aspirin, acetaminophen, ibuprofen, naproxen, or ketoprofen unless instructed by your care team. These medications  may hide a fever. This medication may increase your risk to bruise or bleed. Call your care team if you notice any unusual bleeding. Be careful brushing or flossing your teeth or using a toothpick because you may get an infection or bleed more easily. If you have any dental work done, tell your dentist you are receiving this medication. Talk to your care team if you or your partner are pregnant or think either of you might be pregnant. This medication can cause serious birth defects if taken during pregnancy and for 6 months after the last dose. You will need a negative pregnancy test before starting this medication. Contraception is recommended while taking this medication and for 6 months after the last dose. Your care team can help you find the option that works for you. Do not father a child while taking this medication and for 3 months after the last dose. Use a condom for contraception during this time period. Do not breastfeed while taking this medication and for 7 days after the last dose. This medication may cause infertility. Talk to your care team if you are concerned about your fertility. What side effects may I notice from receiving this medication? Side effects that you should report to your care team as soon as possible: Allergic reactions--skin rash, itching, hives, swelling of the face, lips, tongue, or throat Dry cough, shortness of breath or trouble breathing Increased saliva or tears, increased sweating, stomach cramping, diarrhea, small pupils, unusual weakness or fatigue, slow heartbeat Infection--fever, chills, cough, sore throat, wounds that don't heal, pain or trouble when passing urine, general feeling of discomfort or being unwell Kidney injury--decrease in the amount of urine, swelling of the ankles, hands, or feet Low red blood cell level--unusual weakness or fatigue, dizziness, headache, trouble breathing Severe or prolonged diarrhea Unusual bruising or bleeding Side  effects that usually do not require medical attention (report to your care team if they continue or are bothersome): Constipation Diarrhea Hair loss Loss of appetite Nausea Stomach pain This list may not describe all possible side effects. Call your doctor for medical advice about side effects. You may report side effects to FDA at 1-800-FDA-1088. Where should I keep my medication? This medication is given in a hospital or clinic. It will not be stored at home. NOTE: This sheet is a summary. It may not cover all possible information. If you have questions about this medicine, talk to your doctor, pharmacist, or health care provider.  2023 Elsevier/Gold Standard (2022-02-26 00:00:00)  Leucovorin Injection What is this medication? LEUCOVORIN (loo koe VOR in) prevents side effects from certain medications, such as methotrexate. It works by increasing folate levels. This helps protect healthy cells in your body. It may also be used to treat anemia caused by low levels of folate. It can also be used with fluorouracil, a type of chemotherapy, to treat colorectal cancer. It works by increasing  the effects of fluorouracil in the body. This medicine may be used for other purposes; ask your health care provider or pharmacist if you have questions. What should I tell my care team before I take this medication? They need to know if you have any of these conditions: Anemia from low levels of vitamin B12 in the blood An unusual or allergic reaction to leucovorin, folic acid, other medications, foods, dyes, or preservatives Pregnant or trying to get pregnant Breastfeeding How should I use this medication? This medication is injected into a vein or a muscle. It is given by your care team in a hospital or clinic setting. Talk to your care team about the use of this medication in children. Special care may be needed. Overdosage: If you think you have taken too much of this medicine contact a poison control  center or emergency room at once. NOTE: This medicine is only for you. Do not share this medicine with others. What if I miss a dose? Keep appointments for follow-up doses. It is important not to miss your dose. Call your care team if you are unable to keep an appointment. What may interact with this medication? Capecitabine Fluorouracil Phenobarbital Phenytoin Primidone Trimethoprim;sulfamethoxazole This list may not describe all possible interactions. Give your health care provider a list of all the medicines, herbs, non-prescription drugs, or dietary supplements you use. Also tell them if you smoke, drink alcohol, or use illegal drugs. Some items may interact with your medicine. What should I watch for while using this medication? Your condition will be monitored carefully while you are receiving this medication. This medication may increase the side effects of 5-fluorouracil. Tell your care team if you have diarrhea or mouth sores that do not get better or that get worse. What side effects may I notice from receiving this medication? Side effects that you should report to your care team as soon as possible: Allergic reactions--skin rash, itching, hives, swelling of the face, lips, tongue, or throat This list may not describe all possible side effects. Call your doctor for medical advice about side effects. You may report side effects to FDA at 1-800-FDA-1088. Where should I keep my medication? This medication is given in a hospital or clinic. It will not be stored at home. NOTE: This sheet is a summary. It may not cover all possible information. If you have questions about this medicine, talk to your doctor, pharmacist, or health care provider.  2023 Elsevier/Gold Standard (2022-03-24 00:00:00)  Fluorouracil Injection What is this medication? FLUOROURACIL (flure oh YOOR a sil) treats some types of cancer. It works by slowing down the growth of cancer cells. This medicine may be used  for other purposes; ask your health care provider or pharmacist if you have questions. COMMON BRAND NAME(S): Adrucil What should I tell my care team before I take this medication? They need to know if you have any of these conditions: Blood disorders Dihydropyrimidine dehydrogenase (DPD) deficiency Infection, such as chickenpox, cold sores, herpes Kidney disease Liver disease Poor nutrition Recent or ongoing radiation therapy An unusual or allergic reaction to fluorouracil, other medications, foods, dyes, or preservatives If you or your partner are pregnant or trying to get pregnant Breast-feeding How should I use this medication? This medication is injected into a vein. It is administered by your care team in a hospital or clinic setting. Talk to your care team about the use of this medication in children. Special care may be needed. Overdosage: If you think you have  taken too much of this medicine contact a poison control center or emergency room at once. NOTE: This medicine is only for you. Do not share this medicine with others. What if I miss a dose? Keep appointments for follow-up doses. It is important not to miss your dose. Call your care team if you are unable to keep an appointment. What may interact with this medication? Do not take this medication with any of the following: Live virus vaccines This medication may also interact with the following: Medications that treat or prevent blood clots, such as warfarin, enoxaparin, dalteparin This list may not describe all possible interactions. Give your health care provider a list of all the medicines, herbs, non-prescription drugs, or dietary supplements you use. Also tell them if you smoke, drink alcohol, or use illegal drugs. Some items may interact with your medicine. What should I watch for while using this medication? Your condition will be monitored carefully while you are receiving this medication. This medication may make  you feel generally unwell. This is not uncommon as chemotherapy can affect healthy cells as well as cancer cells. Report any side effects. Continue your course of treatment even though you feel ill unless your care team tells you to stop. In some cases, you may be given additional medications to help with side effects. Follow all directions for their use. This medication may increase your risk of getting an infection. Call your care team for advice if you get a fever, chills, sore throat, or other symptoms of a cold or flu. Do not treat yourself. Try to avoid being around people who are sick. This medication may increase your risk to bruise or bleed. Call your care team if you notice any unusual bleeding. Be careful brushing or flossing your teeth or using a toothpick because you may get an infection or bleed more easily. If you have any dental work done, tell your dentist you are receiving this medication. Avoid taking medications that contain aspirin, acetaminophen, ibuprofen, naproxen, or ketoprofen unless instructed by your care team. These medications may hide a fever. Do not treat diarrhea with over the counter products. Contact your care team if you have diarrhea that lasts more than 2 days or if it is severe and watery. This medication can make you more sensitive to the sun. Keep out of the sun. If you cannot avoid being in the sun, wear protective clothing and sunscreen. Do not use sun lamps, tanning beds, or tanning booths. Talk to your care team if you or your partner wish to become pregnant or think you might be pregnant. This medication can cause serious birth defects if taken during pregnancy and for 3 months after the last dose. A reliable form of contraception is recommended while taking this medication and for 3 months after the last dose. Talk to your care team about effective forms of contraception. Do not father a child while taking this medication and for 3 months after the last dose.  Use a condom while having sex during this time period. Do not breastfeed while taking this medication. This medication may cause infertility. Talk to your care team if you are concerned about your fertility. What side effects may I notice from receiving this medication? Side effects that you should report to your care team as soon as possible: Allergic reactions--skin rash, itching, hives, swelling of the face, lips, tongue, or throat Heart attack--pain or tightness in the chest, shoulders, arms, or jaw, nausea, shortness of breath, cold or clammy  skin, feeling faint or lightheaded Heart failure--shortness of breath, swelling of the ankles, feet, or hands, sudden weight gain, unusual weakness or fatigue Heart rhythm changes--fast or irregular heartbeat, dizziness, feeling faint or lightheaded, chest pain, trouble breathing High ammonia level--unusual weakness or fatigue, confusion, loss of appetite, nausea, vomiting, seizures Infection--fever, chills, cough, sore throat, wounds that don't heal, pain or trouble when passing urine, general feeling of discomfort or being unwell Low red blood cell level--unusual weakness or fatigue, dizziness, headache, trouble breathing Pain, tingling, or numbness in the hands or feet, muscle weakness, change in vision, confusion or trouble speaking, loss of balance or coordination, trouble walking, seizures Redness, swelling, and blistering of the skin over hands and feet Severe or prolonged diarrhea Unusual bruising or bleeding Side effects that usually do not require medical attention (report to your care team if they continue or are bothersome): Dry skin Headache Increased tears Nausea Pain, redness, or swelling with sores inside the mouth or throat Sensitivity to light Vomiting This list may not describe all possible side effects. Call your doctor for medical advice about side effects. You may report side effects to FDA at 1-800-FDA-1088. Where should I  keep my medication? This medication is given in a hospital or clinic. It will not be stored at home. NOTE: This sheet is a summary. It may not cover all possible information. If you have questions about this medicine, talk to your doctor, pharmacist, or health care provider.  2023 Elsevier/Gold Standard (2022-02-17 00:00:00)

## 2022-12-07 NOTE — Progress Notes (Signed)
Patient seen by Ned Card NP today  Vitals are within treatment parameters.  Labs reviewed by Ned Card NP and are within treatment parameters. HgB is 7.4. It is okay to treat. Per physician team, patient is ready for treatment and there are NO modifications to the treatment plan.

## 2022-12-07 NOTE — Progress Notes (Addendum)
[place] OFFICE PROGRESS NOTE   Diagnosis: Colon cancer, anemia  INTERVAL HISTORY:   Aaron Roth returns as scheduled.  He denies nausea/vomiting.  No diarrhea.  He has periodic abdominal pain.  Objective:  Vital signs in last 24 hours:  Blood pressure (!) 164/81, pulse 84, temperature 97.9 F (36.6 C), resp. rate 18, height '5\' 7"'$  (1.702 m), weight 155 lb 9.6 oz (70.6 kg), SpO2 100 %.    HEENT: No thrush or ulcers. Resp: Lungs clear bilaterally. Cardio: Regular rate and rhythm. GI: Large palpable mass mid abdomen. Vascular: No leg edema. Skin: Palms without erythema. Port-A-Cath without erythema.   Lab Results:  Lab Results  Component Value Date   WBC 7.1 12/07/2022   HGB 7.4 (L) 12/07/2022   HCT 26.3 (L) 12/07/2022   MCV 66.8 (L) 12/07/2022   PLT 443 (H) 12/07/2022   NEUTROABS 5.3 12/07/2022    Imaging:  No results found.  Medications: I have reviewed the patient's current medications.  Assessment/Plan: Colon cancer  CT abdomen/pelvis 08/02/2020-small amount of free fluid in the pelvis, postsurgical change involving the left colon, trace right pleural fluid.   Colonoscopy 08/04/2020-cecal mass as well as a 3 mm polyp in the ascending colon, 9 mm polyp in the ascending colon, 3 mm polyp in the transverse colon and 4 small polyps in the transverse colon.  Biopsy of the cecal mass showed adenocarcinoma.  Pathology on the polyps showed tubular adenomas with no high-grade dysplasia or malignancy identified. CT chest 08/04/2020-no evidence of metastatic disease CEA 08/04/2020-2.0 Laparoscopic right colectomy 10/17/2020 by Dr. Dorothyann Gibbs showed adenocarcinoma with mucinous features, moderately differentiated, 6.7 cm; no carcinoma identified and 23 lymph nodes; margins uninvolved; no macroscopic tumor perforation identified; tumor invaded through muscularis propria into pericolorectal tissue; no lymphovascular invasion and no perineural invasion, pT3,  pN0, mismatch repair protein IHC normal, MSI stable.   06/18/2022-CT abdomen/pelvis-homogenous soft tissue mass in the anterior abdomen deep to the umbilicus, 5 mm left lower lobe nodule, new from October 2021 11/04/2022 biopsy of abdominal mass-metastatic mucinous adenocarcinoma, mismatch repair protein IHC normal, foundation 1 pending CTs 11/18/2022-large mass central lower abdomen is increased in size.  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  No evidence of obstruction.  Irregular lesions right lobe of the liver likely new, compatible with metastatic disease.  Increase size of a solid left lower lobe pulmonary nodule likely due to metastatic disease.  Solid pulmonary nodule left upper lobe increased in size likely due to indolent primary lung malignancy. 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 He completed cycle 1 CAPOX beginning 11/13/2011; cycle 7 beginning 03/23/2012. Cycle 8 was completed beginning on 04/13/2012 without oxaliplatin. Restaging CT scans 06/06/2012 without evidence of recurrent disease.   Family history-brother with colon cancer Multiple polyps on colonoscopy 08/04/2020  Port-A-Cath placement 12/04/2022, Interventional Radiology    Disposition: Mr.  Roth appears unchanged.  He is scheduled to begin treatment today with FOLFIRI.  We again reviewed potential toxicities.  He agrees to proceed.  CBC reviewed.  Counts adequate to proceed as above.  He has stable anemia.  He declines Red cell transfusion support.  He will continue oral iron.  He will return for lab, follow-up, cycle 2 FOLFIRI in 2 weeks.  We are available to see him sooner if needed.    Ned Card ANP/GNP-BC   12/07/2022  9:25 AM

## 2022-12-08 ENCOUNTER — Other Ambulatory Visit: Payer: Self-pay

## 2022-12-08 ENCOUNTER — Telehealth: Payer: Self-pay | Admitting: Emergency Medicine

## 2022-12-08 NOTE — Telephone Encounter (Signed)
24 Hour Callback 24 Hour callback post 1st time Folfiri. Called patients listed number, there was no answer and the mailbox was full. Unable to leave a voicemail. Per Cristy Friedlander, RN patient has called x 2 and left voicemail.

## 2022-12-09 ENCOUNTER — Other Ambulatory Visit: Payer: Self-pay

## 2022-12-09 ENCOUNTER — Inpatient Hospital Stay: Payer: Self-pay

## 2022-12-09 VITALS — BP 164/83 | HR 77 | Temp 98.6°F | Resp 18

## 2022-12-09 DIAGNOSIS — C182 Malignant neoplasm of ascending colon: Secondary | ICD-10-CM

## 2022-12-09 MED ORDER — SODIUM CHLORIDE 0.9% FLUSH
10.0000 mL | INTRAVENOUS | Status: DC | PRN
  Administered 2022-12-09: 10 mL

## 2022-12-09 MED ORDER — HEPARIN SOD (PORK) LOCK FLUSH 100 UNIT/ML IV SOLN
500.0000 [IU] | Freq: Once | INTRAVENOUS | Status: AC | PRN
  Administered 2022-12-09: 500 [IU]

## 2022-12-09 NOTE — Patient Instructions (Signed)

## 2022-12-20 ENCOUNTER — Other Ambulatory Visit: Payer: Self-pay | Admitting: Oncology

## 2022-12-21 ENCOUNTER — Other Ambulatory Visit (HOSPITAL_BASED_OUTPATIENT_CLINIC_OR_DEPARTMENT_OTHER): Payer: Self-pay

## 2022-12-21 ENCOUNTER — Inpatient Hospital Stay: Payer: Self-pay

## 2022-12-21 ENCOUNTER — Telehealth: Payer: Self-pay

## 2022-12-21 ENCOUNTER — Other Ambulatory Visit: Payer: Self-pay

## 2022-12-21 ENCOUNTER — Inpatient Hospital Stay (HOSPITAL_BASED_OUTPATIENT_CLINIC_OR_DEPARTMENT_OTHER): Payer: Self-pay | Admitting: Nurse Practitioner

## 2022-12-21 ENCOUNTER — Encounter: Payer: Self-pay | Admitting: Oncology

## 2022-12-21 ENCOUNTER — Encounter: Payer: Self-pay | Admitting: Nurse Practitioner

## 2022-12-21 VITALS — BP 158/69 | HR 79 | Temp 98.1°F | Resp 18 | Ht 67.0 in | Wt 158.4 lb

## 2022-12-21 DIAGNOSIS — D509 Iron deficiency anemia, unspecified: Secondary | ICD-10-CM

## 2022-12-21 DIAGNOSIS — C182 Malignant neoplasm of ascending colon: Secondary | ICD-10-CM

## 2022-12-21 LAB — CBC WITH DIFFERENTIAL (CANCER CENTER ONLY)
Abs Immature Granulocytes: 0.01 10*3/uL (ref 0.00–0.07)
Basophils Absolute: 0 10*3/uL (ref 0.0–0.1)
Basophils Relative: 1 %
Eosinophils Absolute: 0.1 10*3/uL (ref 0.0–0.5)
Eosinophils Relative: 4 %
HCT: 22.9 % — ABNORMAL LOW (ref 39.0–52.0)
Hemoglobin: 6.6 g/dL — CL (ref 13.0–17.0)
Immature Granulocytes: 0 %
Lymphocytes Relative: 34 %
Lymphs Abs: 1 10*3/uL (ref 0.7–4.0)
MCH: 19.2 pg — ABNORMAL LOW (ref 26.0–34.0)
MCHC: 28.8 g/dL — ABNORMAL LOW (ref 30.0–36.0)
MCV: 66.8 fL — ABNORMAL LOW (ref 80.0–100.0)
Monocytes Absolute: 0.4 10*3/uL (ref 0.1–1.0)
Monocytes Relative: 15 %
Neutro Abs: 1.3 10*3/uL — ABNORMAL LOW (ref 1.7–7.7)
Neutrophils Relative %: 46 %
Platelet Count: 379 10*3/uL (ref 150–400)
RBC: 3.43 MIL/uL — ABNORMAL LOW (ref 4.22–5.81)
RDW: 25.3 % — ABNORMAL HIGH (ref 11.5–15.5)
WBC Count: 2.8 10*3/uL — ABNORMAL LOW (ref 4.0–10.5)
nRBC: 0 % (ref 0.0–0.2)

## 2022-12-21 LAB — SAMPLE TO BLOOD BANK

## 2022-12-21 LAB — CMP (CANCER CENTER ONLY)
ALT: 5 U/L (ref 0–44)
AST: 8 U/L — ABNORMAL LOW (ref 15–41)
Albumin: 3.2 g/dL — ABNORMAL LOW (ref 3.5–5.0)
Alkaline Phosphatase: 68 U/L (ref 38–126)
Anion gap: 11 (ref 5–15)
BUN: 9 mg/dL (ref 8–23)
CO2: 27 mmol/L (ref 22–32)
Calcium: 8.3 mg/dL — ABNORMAL LOW (ref 8.9–10.3)
Chloride: 101 mmol/L (ref 98–111)
Creatinine: 0.78 mg/dL (ref 0.61–1.24)
GFR, Estimated: >60 mL/min (ref >60)
Glucose, Bld: 119 mg/dL — ABNORMAL HIGH (ref 70–99)
Potassium: 3.1 mmol/L — ABNORMAL LOW (ref 3.5–5.1)
Sodium: 139 mmol/L (ref 135–145)
Total Bilirubin: 0.4 mg/dL (ref 0.3–1.2)
Total Protein: 5.9 g/dL — ABNORMAL LOW (ref 6.5–8.1)

## 2022-12-21 LAB — PREPARE RBC (CROSSMATCH)

## 2022-12-21 MED ORDER — HEPARIN SOD (PORK) LOCK FLUSH 100 UNIT/ML IV SOLN
500.0000 [IU] | Freq: Once | INTRAVENOUS | Status: AC
  Administered 2022-12-21: 500 [IU] via INTRAVENOUS

## 2022-12-21 MED ORDER — SODIUM CHLORIDE 0.9% FLUSH
10.0000 mL | INTRAVENOUS | Status: AC | PRN
  Administered 2022-12-21: 10 mL via INTRAVENOUS

## 2022-12-21 MED ORDER — POTASSIUM CHLORIDE CRYS ER 20 MEQ PO TBCR
20.0000 meq | EXTENDED_RELEASE_TABLET | Freq: Every day | ORAL | 1 refills | Status: DC
  Filled 2022-12-21: qty 30, 30d supply, fill #0

## 2022-12-21 NOTE — Progress Notes (Signed)
[place] OFFICE PROGRESS NOTE   Diagnosis: Colon cancer, anemia  INTERVAL HISTORY:   Aaron Roth returns as scheduled.  He completed cycle 1 FOLFIRI 12/07/2022.  He had an episode of nausea Wednesday of last week.  No vomiting.  He had a few episodes of diarrhea Wednesday and Thursday last week.  Imodium relieved.  No mouth sores though he did have some tenderness.  He periodically thinks he sees blood in his urine.  He has black stools.  He is taking iron pills.  Objective:  Vital signs in last 24 hours:  Blood pressure (!) 158/69, pulse 79, temperature 98.1 F (36.7 C), temperature source Oral, resp. rate 18, height 5' 7"$  (1.702 m), weight 158 lb 6.4 oz (71.8 kg), SpO2 100 %.    HEENT: No thrush or ulcers. Resp: Lungs clear bilaterally. Cardio: Regular rate and rhythm. GI: No hepatomegaly.  Large palpable mass mid abdomen. Vascular: No leg edema. Port-A-Cath without erythema.  Lab Results:  Lab Results  Component Value Date   WBC 2.8 (L) 12/21/2022   HGB 6.6 (LL) 12/21/2022   HCT 22.9 (L) 12/21/2022   MCV 66.8 (L) 12/21/2022   PLT 379 12/21/2022   NEUTROABS 1.3 (L) 12/21/2022    Imaging:  No results found.  Medications: I have reviewed the patient's current medications.  Assessment/Plan: Colon cancer  CT abdomen/pelvis 08/02/2020-small amount of free fluid in the pelvis, postsurgical change involving the left colon, trace right pleural fluid.   Colonoscopy 08/04/2020-cecal mass as well as a 3 mm polyp in the ascending colon, 9 mm polyp in the ascending colon, 3 mm polyp in the transverse colon and 4 small polyps in the transverse colon.  Biopsy of the cecal mass showed adenocarcinoma.  Pathology on the polyps showed tubular adenomas with no high-grade dysplasia or malignancy identified. CT chest 08/04/2020-no evidence of metastatic disease CEA 08/04/2020-2.0 Laparoscopic right colectomy 10/17/2020 by Dr. Dorothyann Gibbs showed adenocarcinoma with  mucinous features, moderately differentiated, 6.7 cm; no carcinoma identified and 23 lymph nodes; margins uninvolved; no macroscopic tumor perforation identified; tumor invaded through muscularis propria into pericolorectal tissue; no lymphovascular invasion and no perineural invasion, pT3, pN0, mismatch repair protein IHC normal, MSI stable.   06/18/2022-CT abdomen/pelvis-homogenous soft tissue mass in the anterior abdomen deep to the umbilicus, 5 mm left lower lobe nodule, new from October 2021 11/04/2022 biopsy of abdominal mass-metastatic mucinous adenocarcinoma, mismatch repair protein IHC normal, foundation 1 pending CTs 11/18/2022-large mass central lower abdomen is increased in size.  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  No evidence of obstruction.  Irregular lesions right lobe of the liver likely new, compatible with metastatic disease.  Increase size of a solid left lower lobe pulmonary nodule likely due to metastatic disease.  Solid pulmonary nodule left upper lobe increased in size likely due to indolent primary lung malignancy. 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 He completed cycle 1 CAPOX beginning  11/13/2011; cycle 7 beginning 03/23/2012. Cycle 8 was completed beginning on 04/13/2012 without oxaliplatin. Restaging CT scans 06/06/2012 without evidence of recurrent disease.   Family history-brother with colon cancer Multiple polyps on colonoscopy 08/04/2020  Port-A-Cath placement 12/04/2022, Interventional Radiology    Disposition: Aaron Roth appears stable.  He has completed 1 cycle of FOLFIRI.  Overall he tolerated well.  Today's treatment is being held due to mild neutropenia.  Precautions reviewed.  He understands to contact the office with fever, chills, other signs of infection.  We discussed white cell growth factor support on the day of pump discontinuation.  We discussed potential toxicities including bone pain, rash, splenic rupture.  He agrees to proceed.  He will return for lab, follow-up, cycle 2 FOLFIRI in 1 week.  He has progressive anemia.  Hemoglobin is 6.6.  He agrees to a blood transfusion.  We will make the necessary arrangements.  We will collect a urine specimen to evaluate for blood.  We will see him prior to treatment next week.  We are available to see him sooner if needed.    Ned Card ANP/GNP-BC   12/21/2022  9:48 AM

## 2022-12-21 NOTE — Addendum Note (Signed)
Addended by: Velna Hatchet on: 12/21/2022 04:38 PM   Modules accepted: Orders

## 2022-12-21 NOTE — Progress Notes (Signed)
CRITICAL VALUE STICKER  CRITICAL VALUE: Hgb 6.6  RECEIVER (on-site recipient of call):Laterra Lubinski,RN  DATE & TIME NOTIFIED: 12/21/22 @ 0945   MESSENGER (representative from lab):Simona Huh  MD NOTIFIED: Ned Card, NP  TIME OF NOTIFICATION: [phone]  RESPONSE: Will arrange for transfusion

## 2022-12-21 NOTE — Telephone Encounter (Signed)
RBCS was order, [facility] blood confirm, and dash was called.

## 2022-12-21 NOTE — Progress Notes (Signed)
Patient seen by Ned Card NP today  Vitals are within treatment parameters.  Labs reviewed by Ned Card NP and are not all within treatment parameters. Neut 1.3  HgB is 6.3  Per physician team, patient will not be receiving treatment today.

## 2022-12-21 NOTE — Addendum Note (Signed)
Addended by: Velna Hatchet on: 12/21/2022 10:27 AM   Modules accepted: Orders

## 2022-12-21 NOTE — Patient Instructions (Signed)

## 2022-12-22 ENCOUNTER — Other Ambulatory Visit: Payer: Self-pay

## 2022-12-23 ENCOUNTER — Inpatient Hospital Stay: Payer: Self-pay

## 2022-12-23 ENCOUNTER — Inpatient Hospital Stay: Payer: Self-pay | Admitting: Nutrition

## 2022-12-23 DIAGNOSIS — C182 Malignant neoplasm of ascending colon: Secondary | ICD-10-CM

## 2022-12-23 MED ORDER — HEPARIN SOD (PORK) LOCK FLUSH 100 UNIT/ML IV SOLN
500.0000 [IU] | Freq: Every day | INTRAVENOUS | Status: AC | PRN
  Administered 2022-12-23: 500 [IU]

## 2022-12-23 MED ORDER — SODIUM CHLORIDE 0.9% FLUSH
10.0000 mL | INTRAVENOUS | Status: AC | PRN
  Administered 2022-12-23: 10 mL

## 2022-12-23 MED ORDER — SODIUM CHLORIDE 0.9% IV SOLUTION
250.0000 mL | Freq: Once | INTRAVENOUS | Status: AC
  Administered 2022-12-23: 250 mL via INTRAVENOUS

## 2022-12-23 NOTE — Progress Notes (Signed)
77 year old male diagnosed with Colon cancer and anemia.  He is receiving FOLFIRI and followed by Dr. Benay Spice.  PMH includes colostomy, GERD, and Essential HTN.  Medications include Compazine.  Height: 5'7". Weight: 158 pounds 6 oz on Feb 19. UBW: 165 pounds 6 oz. BMI: 24.8  Patient reports difficulty chewing due to dental work. He tries to eat soft foods. He thinks his appetite has improved a little. Reports certain smells bother him from time to time. Diarrhea has improved/resolved by using Imodium. Wife would like recipes for some easy meals, shakes and smoothies.  Nutrition Diagnosis: Unintended wt loss related to cancer and associated treatments as evidenced by 4 % loss in less than one month.  Intervention: Educated on importance of smaller more frequent meals and snacks with increased calories and protein foods as tolerated. Encouraged medications as ordered by MD. Provided education fact sheets, recipes and a cookbook. Also gave contact information for questions.  Monitoring, Evaluation, Goals: Patient will tolerate increased calories and protein to minimize wt loss  Next Visit: As needed.

## 2022-12-23 NOTE — Patient Instructions (Signed)
Blood Transfusion, Adult, Care After The following information offers guidance on how to care for yourself after your procedure. Your health care provider may also give you more specific instructions. If you have problems or questions, contact your health care provider. What can I expect after the procedure? After the procedure, it is common to have: Bruising and soreness where the IV was inserted. A headache. Follow these instructions at home: IV insertion site care     Follow instructions from your health care provider about how to take care of your IV insertion site. Make sure you: Wash your hands with soap and water for at least 20 seconds before and after you change your bandage (dressing). If soap and water are not available, use hand sanitizer. Change your dressing as told by your health care provider. Check your IV insertion site every day for signs of infection. Check for: Redness, swelling, or pain. Bleeding from the site. Warmth. Pus or a bad smell. General instructions Take over-the-counter and prescription medicines only as told by your health care provider. Rest as told by your health care provider. Return to your normal activities as told by your health care provider. Keep all follow-up visits. Lab tests may need to be done at certain periods to recheck your blood counts. Contact a health care provider if: You have itching or red, swollen areas of skin (hives). You have a fever or chills. You have pain in the head, back, or chest. You feel anxious or you feel weak after doing your normal activities. You have redness, swelling, warmth, or pain around the IV insertion site. You have blood coming from the IV insertion site that does not stop with pressure. You have pus or a bad smell coming from your IV insertion site. If you received your blood transfusion in an outpatient setting, you will be told whom to contact to report any reactions. Get help right away if: You  have symptoms of a serious allergic or immune system reaction, including: Trouble breathing or shortness of breath. Swelling of the face, feeling flushed, or widespread rash. Dark urine or blood in the urine. Fast heartbeat. These symptoms may be an emergency. Get help right away. Call 911. Do not wait to see if the symptoms will go away. Do not drive yourself to the hospital. Summary Bruising and soreness around the IV insertion site are common. Check your IV insertion site every day for signs of infection. Rest as told by your health care provider. Return to your normal activities as told by your health care provider. Get help right away for symptoms of a serious allergic or immune system reaction to the blood transfusion. This information is not intended to replace advice given to you by your health care provider. Make sure you discuss any questions you have with your health care provider. Document Revised: 01/16/2022 Document Reviewed: 01/16/2022 Elsevier Patient Education  2023 Else[employer] Inc[employer]  

## 2022-12-24 LAB — TYPE AND SCREEN
ABO/RH(D): O POS
Antibody Screen: NEGATIVE
Unit division: 0
Unit division: 0

## 2022-12-24 LAB — BPAM RBC
Blood Product Expiration Date: 202403182359
Blood Product Expiration Date: 202403182359
ISSUE DATE / TIME: 202402210732
ISSUE DATE / TIME: 202402210732
Unit Type and Rh: 5100
Unit Type and Rh: 5100

## 2022-12-28 ENCOUNTER — Inpatient Hospital Stay (HOSPITAL_BASED_OUTPATIENT_CLINIC_OR_DEPARTMENT_OTHER): Payer: Self-pay | Admitting: Nurse Practitioner

## 2022-12-28 ENCOUNTER — Encounter: Payer: Self-pay | Admitting: Nurse Practitioner

## 2022-12-28 ENCOUNTER — Inpatient Hospital Stay: Payer: Self-pay

## 2022-12-28 ENCOUNTER — Telehealth: Payer: Self-pay

## 2022-12-28 VITALS — BP 156/81 | HR 91 | Temp 98.1°F | Resp 16 | Ht 67.0 in | Wt 158.6 lb

## 2022-12-28 DIAGNOSIS — Z95828 Presence of other vascular implants and grafts: Secondary | ICD-10-CM

## 2022-12-28 DIAGNOSIS — C182 Malignant neoplasm of ascending colon: Secondary | ICD-10-CM

## 2022-12-28 LAB — CBC WITH DIFFERENTIAL (CANCER CENTER ONLY)
Abs Immature Granulocytes: 0.05 10*3/uL (ref 0.00–0.07)
Basophils Absolute: 0.1 10*3/uL (ref 0.0–0.1)
Basophils Relative: 1 %
Eosinophils Absolute: 0.1 10*3/uL (ref 0.0–0.5)
Eosinophils Relative: 2 %
HCT: 32.8 % — ABNORMAL LOW (ref 39.0–52.0)
Hemoglobin: 9.7 g/dL — ABNORMAL LOW (ref 13.0–17.0)
Immature Granulocytes: 1 %
Lymphocytes Relative: 18 %
Lymphs Abs: 1.5 10*3/uL (ref 0.7–4.0)
MCH: 21.6 pg — ABNORMAL LOW (ref 26.0–34.0)
MCHC: 29.6 g/dL — ABNORMAL LOW (ref 30.0–36.0)
MCV: 73.1 fL — ABNORMAL LOW (ref 80.0–100.0)
Monocytes Absolute: 0.6 10*3/uL (ref 0.1–1.0)
Monocytes Relative: 7 %
Neutro Abs: 6 10*3/uL (ref 1.7–7.7)
Neutrophils Relative %: 71 %
Platelet Count: 430 10*3/uL — ABNORMAL HIGH (ref 150–400)
RBC: 4.49 MIL/uL (ref 4.22–5.81)
RDW: 27.3 % — ABNORMAL HIGH (ref 11.5–15.5)
WBC Count: 8.4 10*3/uL (ref 4.0–10.5)
nRBC: 0 % (ref 0.0–0.2)

## 2022-12-28 LAB — CMP (CANCER CENTER ONLY)
ALT: 7 U/L (ref 0–44)
AST: 11 U/L — ABNORMAL LOW (ref 15–41)
Albumin: 3.1 g/dL — ABNORMAL LOW (ref 3.5–5.0)
Alkaline Phosphatase: 82 U/L (ref 38–126)
Anion gap: 8 (ref 5–15)
BUN: 10 mg/dL (ref 8–23)
CO2: 28 mmol/L (ref 22–32)
Calcium: 8.4 mg/dL — ABNORMAL LOW (ref 8.9–10.3)
Chloride: 102 mmol/L (ref 98–111)
Creatinine: 0.9 mg/dL (ref 0.61–1.24)
GFR, Estimated: >60 mL/min (ref >60)
Glucose, Bld: 131 mg/dL — ABNORMAL HIGH (ref 70–99)
Potassium: 3.7 mmol/L (ref 3.5–5.1)
Sodium: 138 mmol/L (ref 135–145)
Total Bilirubin: 0.4 mg/dL (ref 0.3–1.2)
Total Protein: 6.2 g/dL — ABNORMAL LOW (ref 6.5–8.1)

## 2022-12-28 MED ORDER — HEPARIN SOD (PORK) LOCK FLUSH 100 UNIT/ML IV SOLN
500.0000 [IU] | Freq: Once | INTRAVENOUS | Status: AC
  Administered 2022-12-28: 500 [IU] via INTRAVENOUS

## 2022-12-28 MED ORDER — SODIUM CHLORIDE 0.9% FLUSH
10.0000 mL | INTRAVENOUS | Status: DC | PRN
  Administered 2022-12-28: 10 mL via INTRAVENOUS

## 2022-12-28 NOTE — Progress Notes (Signed)
[place] OFFICE PROGRESS NOTE   Diagnosis: Colon cancer, anemia  INTERVAL HISTORY:   Aaron Roth returns as scheduled.  He completed cycle 1 FOLFIRI 12/07/2022.  Cycle 2 was held on 12/21/2022 due to neutropenia.  He was transfused 2 units of blood 12/23/2022.  He notes an improvement in his energy level.  He is taking oral iron.  He thinks he may be seeing blood in his urine.  No other bleeding.  He denies nausea/vomiting.  No diarrhea.  He is scheduled to have dental extractions later this week.  Objective:  Vital signs in last 24 hours:  Blood pressure (!) 156/81, pulse 91, temperature 98.1 F (36.7 C), temperature source Oral, resp. rate 16, height '5\' 7"'$  (1.702 m), weight 158 lb 9.6 oz (71.9 kg), SpO2 92 %.    HEENT: No thrush or ulcers. Resp: Lungs clear bilaterally. Cardio: Regular rate and rhythm. GI: No hepatomegaly.  Large palpable mid abdominal mass. Vascular: No leg edema. Port-A-Cath without erythema.  Lab Results:  Lab Results  Component Value Date   WBC 2.8 (L) 12/21/2022   HGB 6.6 (LL) 12/21/2022   HCT 22.9 (L) 12/21/2022   MCV 66.8 (L) 12/21/2022   PLT 379 12/21/2022   NEUTROABS 1.3 (L) 12/21/2022    Imaging:  No results found.  Medications: I have reviewed the patient's current medications.  Assessment/Plan: Colon cancer  CT abdomen/pelvis 08/02/2020-small amount of free fluid in the pelvis, postsurgical change involving the left colon, trace right pleural fluid.   Colonoscopy 08/04/2020-cecal mass as well as a 3 mm polyp in the ascending colon, 9 mm polyp in the ascending colon, 3 mm polyp in the transverse colon and 4 small polyps in the transverse colon.  Biopsy of the cecal mass showed adenocarcinoma.  Pathology on the polyps showed tubular adenomas with no high-grade dysplasia or malignancy identified. CT chest 08/04/2020-no evidence of metastatic disease CEA 08/04/2020-2.0 Laparoscopic right colectomy 10/17/2020 by Dr.  Dorothyann Gibbs showed adenocarcinoma with mucinous features, moderately differentiated, 6.7 cm; no carcinoma identified and 23 lymph nodes; margins uninvolved; no macroscopic tumor perforation identified; tumor invaded through muscularis propria into pericolorectal tissue; no lymphovascular invasion and no perineural invasion, pT3, pN0, mismatch repair protein IHC normal, MSI stable.   06/18/2022-CT abdomen/pelvis-homogenous soft tissue mass in the anterior abdomen deep to the umbilicus, 5 mm left lower lobe nodule, new from October 2021 11/04/2022 biopsy of abdominal mass-metastatic mucinous adenocarcinoma, mismatch repair protein IHC normal, foundation 1 pending CTs 11/18/2022-large mass central lower abdomen is increased in size.  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  No evidence of obstruction.  Irregular lesions right lobe of the liver likely new, compatible with metastatic disease.  Increase size of a solid left lower lobe pulmonary nodule likely due to metastatic disease.  Solid pulmonary nodule left upper lobe increased in size likely due to indolent primary lung malignancy. Foundation 1 testing (date of collection 10/17/2020, specimen received 11/24/2022)-microsatellite status is equivocal; tumor mutation burden 6; K-ras wild-type; NRAS G12S; BRAF G466A Cycle 1 FOLFIRI 12/07/2022 Chemotherapy held 12/21/2022 due to neutropenia Chemotherapy held 12/28/2022 per patient due to upcoming dental extractions Iron deficiency anemia October 2021  08/02/2020 hemoglobin 3.8, MCV 56, ferritin 3, stool positive for occult blood 10/19/2020 hemoglobin 7.3, MCV 74 11/04/2022 hemoglobin 4.5 11/05/2022 ferritin 3 Ferrous sulfate 325 mg twice daily recommended 11/06/2022 2 units packed red blood cells 11/17/2022 stool cards  positive for blood 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 He completed cycle 1 CAPOX beginning 11/13/2011; cycle 7 beginning 03/23/2012. Cycle 8 was completed beginning on 04/13/2012 without oxaliplatin. Restaging CT scans 06/06/2012 without evidence of recurrent disease.   Family history-brother with colon cancer Multiple polyps on colonoscopy 08/04/2020  Port-A-Cath placement 12/04/2022, Interventional Radiology    Disposition: Aaron Roth appears unchanged.  He has completed 1 cycle of FOLFIRI.  Cycle 2 was held last week due to neutropenia.  Initial plan was to proceed with cycle 2 today with white cell growth factor support.  He has plans to have multiple teeth extracted later this week and declines chemotherapy until 01/11/2023.  He is not sure he will agree to white cell growth factor support due to the cost.  We are checking labs today to make sure blood counts are okay for the dental extractions.  He will return for lab, follow-up, chemotherapy 01/11/2023.    Ned Card ANP/GNP-BC   12/28/2022  9:43 AM

## 2022-12-28 NOTE — Progress Notes (Signed)
Patient seen by Ned Card NP today  Vitals are within treatment parameters.  No labs to review.  Pt initially lab work today. Labs to be drawn prior to discharge.      Per physician team, patient will not be receiving treatment today.

## 2022-12-28 NOTE — Telephone Encounter (Signed)
Patient attempted to be outreached by Levi Aland, PharmD Candidate on 12/28/22 to discuss hypertension. Left voicemail for patient to return our call at their convenience at [phone].   Levi Aland, PharmD Candidate, Class of 2024  [place]. [place], PharmD PGY-1 [facility] Pharmacy Resident

## 2022-12-28 NOTE — Patient Instructions (Signed)

## 2022-12-30 ENCOUNTER — Inpatient Hospital Stay: Payer: Self-pay

## 2023-01-04 ENCOUNTER — Other Ambulatory Visit: Payer: Self-pay

## 2023-01-04 ENCOUNTER — Ambulatory Visit: Payer: Self-pay

## 2023-01-04 ENCOUNTER — Ambulatory Visit: Payer: Self-pay | Admitting: Nurse Practitioner

## 2023-01-05 ENCOUNTER — Ambulatory Visit: Payer: Self-pay | Admitting: Critical Care Medicine

## 2023-01-07 ENCOUNTER — Ambulatory Visit: Payer: Self-pay | Attending: Critical Care Medicine | Admitting: Critical Care Medicine

## 2023-01-07 ENCOUNTER — Encounter: Payer: Self-pay | Admitting: Critical Care Medicine

## 2023-01-07 ENCOUNTER — Encounter: Payer: Self-pay | Admitting: Oncology

## 2023-01-07 ENCOUNTER — Other Ambulatory Visit: Payer: Self-pay

## 2023-01-07 VITALS — BP 145/88 | HR 76 | Ht 67.0 in

## 2023-01-07 DIAGNOSIS — K029 Dental caries, unspecified: Secondary | ICD-10-CM

## 2023-01-07 DIAGNOSIS — I10 Essential (primary) hypertension: Secondary | ICD-10-CM

## 2023-01-07 DIAGNOSIS — Z720 Tobacco use: Secondary | ICD-10-CM

## 2023-01-07 DIAGNOSIS — C182 Malignant neoplasm of ascending colon: Secondary | ICD-10-CM

## 2023-01-07 MED ORDER — CARVEDILOL 25 MG PO TABS
25.0000 mg | ORAL_TABLET | Freq: Two times a day (BID) | ORAL | 1 refills | Status: DC
  Filled 2023-01-07: qty 180, 90d supply, fill #0
  Filled 2023-04-08: qty 60, 30d supply, fill #0

## 2023-01-07 MED ORDER — VALSARTAN-HYDROCHLOROTHIAZIDE 320-25 MG PO TABS
1.0000 | ORAL_TABLET | Freq: Every day | ORAL | 3 refills | Status: DC
  Filled 2023-01-07: qty 90, 90d supply, fill #0

## 2023-01-07 MED ORDER — AMLODIPINE BESYLATE 10 MG PO TABS
10.0000 mg | ORAL_TABLET | Freq: Every day | ORAL | 1 refills | Status: DC
  Filled 2023-01-07: qty 90, 90d supply, fill #0
  Filled 2023-04-08: qty 30, 30d supply, fill #0

## 2023-01-07 NOTE — Assessment & Plan Note (Signed)
Patient is down to about 2 cigarettes a day

## 2023-01-07 NOTE — Assessment & Plan Note (Signed)
Resolved with all teeth extracted

## 2023-01-07 NOTE — Assessment & Plan Note (Signed)
Recurrent colon cancer with abdominal mass treatment per oncology

## 2023-01-07 NOTE — Patient Instructions (Signed)
No change in medications our goal for blood pressure is less than or equal to 140/90  Return to Dr. Joya Gaskins 3 months

## 2023-01-07 NOTE — Progress Notes (Signed)
Established Patient Office Visit  Subjective:  Patient ID: Aaron Roth, male    DOB: [birth year]  Age: 77 y.o. MRN: [id number]  CC:  Primary care follow-up  HPI 11/19/21 Aaron Roth presents for blood pressure follow-up.  Patient has try to be more compliant with his diet but yet on arrival blood pressure 149/87.  Patient does agree to receive the Prevnar 20 vaccine at this visit.  Patient does have cataracts he is trying to find a surgeon who will take care of him pro bono.  Patient has no other complaints Patient is still smoking about 3 to 4 cigarettes daily  03/05/2022 Patient returns today for follow-up of hypertension.  Patient has a blood pressure machine and brings in readings from home.  It was determined by [facility] blood pressure management team that he was hypertensive and not controlled and a phone call occurred to his home.  The Pharm.D. that called the patient determined that he had stopped all his blood pressure medicines.  He was confused because he thought when he was to have cataract surgery he was to stop all blood pressure medicines.  Instead he they just simply wanted to be nothing by mouth after midnight the night before the eye surgery.  Patient's been back on his medications which include carvedilol 25 mg twice daily amlodipine 10 mg daily hydralazine 25 mg twice daily valsartan HCT 320/25 1 daily Medications he brings him in with him he does need refills on several the medicines but has not run out.  He is having no difficulty with he does complain of some rash on his forearm and his upper back.  He has no other complaints at this time.  Blood pressure on arrival is excellent 138/77.  He has several 142/77 blood pressures on his home meter  7/11   This is a work in visit for abdominal pain.  Patient on arrival has an elevated blood pressure 174/81 he has not been compliant with his blood pressure medication skipping his evening doses of his twice daily meds and  missing amlodipine doses.  He had an episode about a month ago where he had emesis and abdominal pain left lower quadrant when he changed his food to healthier diet this pain went away.  He is due additional dental work at this time.  The patient does need a referral back to oncology for follow-up of his colon cancer  9/13 This patient is seen in return follow-up and unfortunately since July he has developed emesis and increased abdominal pain and a fullness in the abdomen and went to oncology where he was found to have an abdominal mass.  He is about to have a biopsy of this lesion.  Likely has recurrence of his colon cancer unfortunately.  From a blood pressure perspective he has stopped smoking this week which has been a major success for him.  He however occasionally does not take his afternoon dose of carvedilol and hydralazine.  He has been maintaining amlodipine daily and valsartan HCT daily.  The patient has no other complaints at this time. Below is a copy of the colon cancer evaluation at oncology August 21  Oncology appt 8/21: Colon cancer  CT abdomen/pelvis 08/02/2020-small amount of free fluid in the pelvis, postsurgical change involving the left colon, trace right pleural fluid.   Colonoscopy 08/04/2020-cecal mass as well as a 3 mm polyp in the ascending colon, 9 mm polyp in the ascending colon, 3 mm polyp in the transverse colon  and 4 small polyps in the transverse colon.  Biopsy of the cecal mass showed adenocarcinoma.  Pathology on the polyps showed tubular adenomas with no high-grade dysplasia or malignancy identified. CT chest 08/04/2020-no evidence of metastatic disease CEA 08/04/2020-2.0 Laparoscopic right colectomy 10/17/2020 by Dr. Dorothyann Gibbs showed adenocarcinoma with mucinous features, moderately differentiated, 6.7 cm; no carcinoma identified and 23 lymph nodes; margins uninvolved; no macroscopic tumor perforation identified; tumor invaded through muscularis propria into  pericolorectal tissue; no lymphovascular invasion and no perineural invasion, pT3, pN0, mismatch repair protein IHC normal, MSI stable. 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 He completed cycle 1 CAPOX beginning 11/13/2011; cycle 7 beginning 03/23/2012. Cycle 8 was completed beginning on 04/13/2012 without oxaliplatin. Restaging CT scans 06/06/2012 without evidence of recurrent disease.   Family history-brother with colon cancer Multiple polyps on colonoscopy 08/04/2020   CT Abd mass Bx 08/03/22 scheduled Blood pressure initially are elevated on arrival on recheck is 135/76  10/28/22 Patient seen in return follow-up blood pressure remains high systolic levels are high diastolic are acceptable.  He forgets to take his afternoon carvedilol dose.  He also states hydralazine causes itching when he takes this and has not been taking this medication.  He has been compliant with his other blood pressure medicines.  He is yet to get the abdominal mass biopsy recommended by oncology.  He does have dental conditions he is undergoing dental care to have all teeth extracted.  He has no other complaints. Patient declined the flu vaccine  01/07/23 Patient seen in follow-up he had his abdominal mass biopsied it is recurrent colon cancer he is now seeing oncology and has had 1 cycle of chemotherapy and a Port-A-Cath placed.  He has had all of his carious teeth now removed.  He is being fitted for dentures.  On arrival blood pressure elevated 145/88.  His goals to be less than 140/90.  He still smoking 2  cigarettes daily.  He has lost some weight.  He had neutropenia and has had to receive a blood transfusion with the chemotherapy.  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   Essential hypertension 08/10/2020   GERD (gastroesophageal reflux disease)    pt. denies    Past Surgical History:  Procedure Laterality Date   BIOPSY  08/04/2020   Procedure: BIOPSY;  Surgeon: [name], MD;  Location: [facility];  Service: Endoscopy;;  EGD and COLON   COLONOSCOPY  06/03/2012   Procedure: COLONOSCOPY;  Surgeon: [name], MD;  Location: [facility];  Service: General;  Laterality: N/A;   COLONOSCOPY WITH PROPOFOL N/A 08/04/2020   Procedure: COLONOSCOPY WITH PROPOFOL;  Surgeon: [name], MD;  Location: [facility];  Service: Endoscopy;  Laterality:  N/A;   COLOSTOMY  09/25/2011   Procedure: COLOSTOMY;  Surgeon: [name], MD;  Location: [facility];  Service: General;  Laterality: N/A;   COLOSTOMY TAKEDOWN  07/14/2012   Procedure: LAPAROSCOPIC COLOSTOMY TAKEDOWN;  Surgeon: [name], MD;  Location: [facility];  Service: General;  Laterality: N/A;  laparoscopic coverted to open takedown of colostomy   ESOPHAGOGASTRODUODENOSCOPY (EGD) WITH PROPOFOL N/A 08/04/2020   Procedure: ESOPHAGOGASTRODUODENOSCOPY (EGD) WITH PROPOFOL;  Surgeon: [name], MD;  Location: [facility];  Service: Endoscopy;  Laterality: N/A;   GANGLION CYST EXCISION     wrist and foot   IR IMAGING GUIDED PORT INSERTION  12/04/2022   LAPAROSCOPIC PARTIAL COLECTOMY N/A 10/17/2020   Procedure: LAPAROSCOPIC RIGHT COLECTOMY;   Surgeon: [name], MD;  Location: [facility];  Service: General;  Laterality: N/A;   PARTIAL COLECTOMY  09/25/2011   Procedure: PARTIAL COLECTOMY;  Surgeon: [name], MD;  Location: [facility];  Service: General;;   POLYPECTOMY  08/04/2020   Procedure: POLYPECTOMY;  Surgeon: [name], MD;  Location: [facility];  Service: Endoscopy;;   PORT-A-CATH REMOVAL  07/14/2012   Procedure: REMOVAL PORT-A-CATH;  Surgeon: [name], MD;  Location: [facility];  Service: General;  Laterality: N/A;   PORTACATH PLACEMENT  11/10/2011   Procedure: INSERTION PORT-A-CATH;  Surgeon: [name], MD;  Location: [place];  Service: General;  Laterality: Left;  left subclavian   PORTACATH PLACEMENT  11/10/2011    Family History  Problem Relation Age of Onset   Bone cancer Brother    ALS Sister    Cancer Brother        bone, colon, testicular   Cancer Father        prostate    Social History   Socioeconomic History   Marital status: Married    Spouse name: Not on file   Number of children: Not on file   Years of education: Not on file   Highest education level: Not on file  Occupational History   Not on file  Tobacco Use   Smoking status: Some Days    Packs/day: 0.25    Years: 40.00    Total pack years: 10.00    Types: Cigarettes   Smokeless tobacco: Never  Vaping Use   Vaping Use: Never used  Substance and Sexual Activity   Alcohol use: No   Drug use: No   Sexual activity: Not Currently  Other Topics Concern   Not on file  Social History Narrative   Not on file   Social Determinants of Health   Financial Resource Strain: Not on file  Food Insecurity: Not on file  Transportation Needs: Not on file  Physical Activity: Not on file  Stress: Not on file  Social Connections: Not on file  Intimate Partner Violence: Not on file    Outpatient Medications Prior to Visit  Medication Sig Dispense Refill   ferrous sulfate 325 (65 FE) MG tablet TAKE 1 TABLET (325  MG TOTAL) BY MOUTH 2 (TWO) TIMES DAILY WITH A MEAL. 180 tablet 1   Nutritional Supplements (IMMUNE ENHANCE) TABS Take 1 tablet by mouth daily.     potassium chloride SA (KLOR-CON M) 20 MEQ tablet Take 1 tablet (20 mEq total) by mouth daily. 30 tablet 1   prochlorperazine (COMPAZINE) 10 MG tablet Take 1 tablet (10 mg total) by mouth every 6 (six) hours as needed for nausea or vomiting. 30 tablet 3   triamcinolone cream (KENALOG) 0.1 %  Apply 1 application. topically 2 (two) times daily. 30 g 0   amLODipine (NORVASC) 10 MG tablet TAKE 1 TABLET (10 MG TOTAL) BY MOUTH DAILY. 90 tablet 1   carvedilol (COREG) 25 MG tablet Take 1 tablet (25 mg total) by mouth 2 (two) times daily with a meal. 180 tablet 1   valsartan-hydrochlorothiazide (DIOVAN-HCT) 320-25 MG tablet Take 1 tablet by mouth daily. 90 tablet 3   lidocaine-prilocaine (EMLA) cream Apply to port site 1-2 hours prior to use (Patient not taking: Reported on 12/21/2022) 30 g 2   Facility-Administered Medications Prior to Visit  Medication Dose Route Frequency Provider Last Rate Last Admin   sodium chloride flush (NS) 0.9 % injection 10 mL  10 mL Intravenous PRN Owens Shark, NP   10 mL at 12/21/22 1023    Allergies  Allergen Reactions   Hydralazine Itching   Other     Pre-breaded shrimp    ROS Review of Systems  Constitutional:  Positive for appetite change and unexpected weight change.  HENT: Negative.  Negative for ear pain, postnasal drip, rhinorrhea, sinus pressure, sore throat, trouble swallowing and voice change.   Eyes: Negative.   Respiratory: Negative.  Negative for apnea, cough, choking, chest tightness, shortness of breath, wheezing and stridor.   Cardiovascular: Negative.  Negative for chest pain, palpitations and leg swelling.  Gastrointestinal:  Positive for abdominal distention and abdominal pain. Negative for nausea and vomiting.  Genitourinary: Negative.   Musculoskeletal: Negative.  Negative for arthralgias and  myalgias.  Skin: Negative.  Negative for rash.  Allergic/Immunologic: Negative.  Negative for environmental allergies and food allergies.  Neurological: Negative.  Negative for dizziness, syncope, weakness and headaches.  Hematological: Negative.  Negative for adenopathy. Does not bruise/bleed easily.  Psychiatric/Behavioral: Negative.  Negative for agitation and sleep disturbance. The patient is not nervous/anxious.       Objective:    Physical Exam Vitals reviewed.  Constitutional:      Appearance: Normal appearance. He is well-developed. He is not diaphoretic.     Comments: Weight loss from treatments and cancer  HENT:     Head: Normocephalic and atraumatic.     Nose: No nasal deformity, septal deviation, mucosal edema or rhinorrhea.     Right Sinus: No maxillary sinus tenderness or frontal sinus tenderness.     Left Sinus: No maxillary sinus tenderness or frontal sinus tenderness.     Mouth/Throat:     Pharynx: No oropharyngeal exudate.     Comments: Edentulous Eyes:     General: No scleral icterus.    Conjunctiva/sclera: Conjunctivae normal.     Pupils: Pupils are equal, round, and reactive to light.  Neck:     Thyroid: No thyromegaly.     Vascular: No carotid bruit or JVD.     Trachea: Trachea normal. No tracheal tenderness or tracheal deviation.  Cardiovascular:     Rate and Rhythm: Normal rate and regular rhythm.     Chest Wall: PMI is not displaced.     Pulses: Normal pulses. No decreased pulses.     Heart sounds: Normal heart sounds, S1 normal and S2 normal. Heart sounds not distant. No murmur heard.    No systolic murmur is present.     No diastolic murmur is present.     No friction rub. No gallop. No S3 or S4 sounds.  Pulmonary:     Effort: No tachypnea, accessory muscle usage or respiratory distress.     Breath sounds: No stridor. No decreased breath  sounds, wheezing, rhonchi or rales.  Chest:     Chest wall: No tenderness.  Abdominal:     General: Bowel  sounds are normal. There is no distension.     Palpations: Abdomen is soft. Abdomen is not rigid. There is mass.     Tenderness: There is no abdominal tenderness. There is no right CVA tenderness, left CVA tenderness, guarding or rebound.     Hernia: No hernia is present.     Comments: Firm mass is palpated in the umbilical area appears to be nontender and correlates with the CT scan findings, seems to be enlarging  Musculoskeletal:        General: Normal range of motion.     Cervical back: Normal range of motion and neck supple. No edema, erythema or rigidity. No muscular tenderness. Normal range of motion.  Lymphadenopathy:     Head:     Right side of head: No submental or submandibular adenopathy.     Left side of head: No submental or submandibular adenopathy.     Cervical: No cervical adenopathy.  Skin:    General: Skin is warm and dry.     Coloration: Skin is not pale.     Findings: No rash.     Nails: There is no clubbing.  Neurological:     Mental Status: He is alert and oriented to person, place, and time.     Sensory: No sensory deficit.  Psychiatric:        Speech: Speech normal.        Behavior: Behavior normal.     BP (!) 145/88   Pulse 76   Ht '5\' 7"'$  (1.702 m)   SpO2 98%   BMI 24.84 kg/m  Wt Readings from Last 3 Encounters:  12/28/22 158 lb 9.6 oz (71.9 kg)  12/21/22 158 lb 6.4 oz (71.8 kg)  12/07/22 155 lb 9.6 oz (70.6 kg)     There are no preventive care reminders to display for this patient.     There are no preventive care reminders to display for this patient.  No results found for: "TSH" Lab Results  Component Value Date   WBC 8.4 12/28/2022   HGB 9.7 (L) 12/28/2022   HCT 32.8 (L) 12/28/2022   MCV 73.1 (L) 12/28/2022   PLT 430 (H) 12/28/2022   Lab Results  Component Value Date   NA 138 12/28/2022   K 3.7 12/28/2022   CO2 28 12/28/2022   GLUCOSE 131 (H) 12/28/2022   BUN 10 12/28/2022   CREATININE 0.90 12/28/2022   BILITOT 0.4  12/28/2022   ALKPHOS 82 12/28/2022   AST 11 (L) 12/28/2022   ALT 7 12/28/2022   PROT 6.2 (L) 12/28/2022   ALBUMIN 3.1 (L) 12/28/2022   CALCIUM 8.4 (L) 12/28/2022   ANIONGAP 8 12/28/2022   EGFR 68 10/16/2021   No results found for: "CHOL" No results found for: "HDL" No results found for: "LDLCALC" No results found for: "TRIG" No results found for: "CHOLHDL" No results found for: "HGBA1C" Recent abdominal CT scan performed by oncology reviewed and does show the presence of a 10 x 7 x 6 cm mass with small bowel involvement compatible with recurrence of colon cancer   Assessment & Plan:   Problem List Items Addressed This Visit       Cardiovascular and Mediastinum   Essential hypertension - Primary    Allowing blood pressure to trend higher no change in medication      Relevant Medications  amLODipine (NORVASC) 10 MG tablet   carvedilol (COREG) 25 MG tablet   valsartan-hydrochlorothiazide (DIOVAN-HCT) 320-25 MG tablet     Digestive   Colon cancer, ascending ([facility])    Recurrent colon cancer with abdominal mass treatment per oncology      RESOLVED: Dental caries    Resolved with all teeth extracted        Other   Tobacco use    Patient is down to about 2 cigarettes a day      Meds ordered this encounter  Medications   amLODipine (NORVASC) 10 MG tablet    Sig: Take 1 tablet (10 mg total) by mouth daily.    Dispense:  90 tablet    Refill:  1   carvedilol (COREG) 25 MG tablet    Sig: Take 1 tablet (25 mg total) by mouth 2 (two) times daily with a meal.    Dispense:  180 tablet    Refill:  1   valsartan-hydrochlorothiazide (DIOVAN-HCT) 320-25 MG tablet    Sig: Take 1 tablet by mouth daily.    Dispense:  90 tablet    Refill:  3    Follow-up: Return in about 3 months (around 04/09/2023) for htn.    [name], MD

## 2023-01-07 NOTE — Assessment & Plan Note (Signed)
Allowing blood pressure to trend higher no change in medication

## 2023-01-11 ENCOUNTER — Inpatient Hospital Stay (HOSPITAL_BASED_OUTPATIENT_CLINIC_OR_DEPARTMENT_OTHER): Payer: Self-pay | Admitting: Oncology

## 2023-01-11 ENCOUNTER — Inpatient Hospital Stay: Payer: Self-pay

## 2023-01-11 ENCOUNTER — Inpatient Hospital Stay: Payer: Self-pay | Admitting: Nurse Practitioner

## 2023-01-11 ENCOUNTER — Inpatient Hospital Stay: Payer: Self-pay | Attending: Oncology

## 2023-01-11 ENCOUNTER — Encounter: Payer: Self-pay | Admitting: *Deleted

## 2023-01-11 VITALS — BP 154/71 | HR 68

## 2023-01-11 VITALS — BP 166/87 | HR 82 | Temp 98.1°F | Resp 18 | Ht 67.0 in | Wt 154.0 lb

## 2023-01-11 DIAGNOSIS — Z79899 Other long term (current) drug therapy: Secondary | ICD-10-CM | POA: Insufficient documentation

## 2023-01-11 DIAGNOSIS — C182 Malignant neoplasm of ascending colon: Secondary | ICD-10-CM

## 2023-01-11 DIAGNOSIS — C7989 Secondary malignant neoplasm of other specified sites: Secondary | ICD-10-CM | POA: Insufficient documentation

## 2023-01-11 DIAGNOSIS — R1900 Intra-abdominal and pelvic swelling, mass and lump, unspecified site: Secondary | ICD-10-CM | POA: Insufficient documentation

## 2023-01-11 DIAGNOSIS — D509 Iron deficiency anemia, unspecified: Secondary | ICD-10-CM | POA: Insufficient documentation

## 2023-01-11 DIAGNOSIS — Z5111 Encounter for antineoplastic chemotherapy: Secondary | ICD-10-CM | POA: Insufficient documentation

## 2023-01-11 DIAGNOSIS — C180 Malignant neoplasm of cecum: Secondary | ICD-10-CM | POA: Insufficient documentation

## 2023-01-11 DIAGNOSIS — Z5189 Encounter for other specified aftercare: Secondary | ICD-10-CM | POA: Insufficient documentation

## 2023-01-11 DIAGNOSIS — Z933 Colostomy status: Secondary | ICD-10-CM | POA: Insufficient documentation

## 2023-01-11 DIAGNOSIS — Z800 Family history of malignant neoplasm of digestive organs: Secondary | ICD-10-CM | POA: Insufficient documentation

## 2023-01-11 LAB — CBC WITH DIFFERENTIAL (CANCER CENTER ONLY)
Abs Immature Granulocytes: 0.02 10*3/uL (ref 0.00–0.07)
Basophils Absolute: 0 10*3/uL (ref 0.0–0.1)
Basophils Relative: 1 %
Eosinophils Absolute: 0.2 10*3/uL (ref 0.0–0.5)
Eosinophils Relative: 3 %
HCT: 29.8 % — ABNORMAL LOW (ref 39.0–52.0)
Hemoglobin: 8.9 g/dL — ABNORMAL LOW (ref 13.0–17.0)
Immature Granulocytes: 0 %
Lymphocytes Relative: 16 %
Lymphs Abs: 1.2 10*3/uL (ref 0.7–4.0)
MCH: 21.6 pg — ABNORMAL LOW (ref 26.0–34.0)
MCHC: 29.9 g/dL — ABNORMAL LOW (ref 30.0–36.0)
MCV: 72.3 fL — ABNORMAL LOW (ref 80.0–100.0)
Monocytes Absolute: 0.7 10*3/uL (ref 0.1–1.0)
Monocytes Relative: 9 %
Neutro Abs: 5.3 10*3/uL (ref 1.7–7.7)
Neutrophils Relative %: 71 %
Platelet Count: 468 10*3/uL — ABNORMAL HIGH (ref 150–400)
RBC: 4.12 MIL/uL — ABNORMAL LOW (ref 4.22–5.81)
RDW: 25.2 % — ABNORMAL HIGH (ref 11.5–15.5)
WBC Count: 7.5 10*3/uL (ref 4.0–10.5)
nRBC: 0 % (ref 0.0–0.2)

## 2023-01-11 LAB — CMP (CANCER CENTER ONLY)
ALT: 6 U/L (ref 0–44)
AST: 9 U/L — ABNORMAL LOW (ref 15–41)
Albumin: 3.3 g/dL — ABNORMAL LOW (ref 3.5–5.0)
Alkaline Phosphatase: 84 U/L (ref 38–126)
Anion gap: 7 (ref 5–15)
BUN: 10 mg/dL (ref 8–23)
CO2: 28 mmol/L (ref 22–32)
Calcium: 8.6 mg/dL — ABNORMAL LOW (ref 8.9–10.3)
Chloride: 101 mmol/L (ref 98–111)
Creatinine: 0.8 mg/dL (ref 0.61–1.24)
GFR, Estimated: >60 mL/min (ref >60)
Glucose, Bld: 111 mg/dL — ABNORMAL HIGH (ref 70–99)
Potassium: 3.8 mmol/L (ref 3.5–5.1)
Sodium: 136 mmol/L (ref 135–145)
Total Bilirubin: 0.4 mg/dL (ref 0.3–1.2)
Total Protein: 6.5 g/dL (ref 6.5–8.1)

## 2023-01-11 LAB — URINALYSIS, COMPLETE (UACMP) WITH MICROSCOPIC
Bacteria, UA: NONE SEEN
Bilirubin Urine: NEGATIVE
Glucose, UA: NEGATIVE mg/dL
Hgb urine dipstick: NEGATIVE
Ketones, ur: NEGATIVE mg/dL
Leukocytes,Ua: NEGATIVE
Nitrite: NEGATIVE
Protein, ur: NEGATIVE mg/dL
Specific Gravity, Urine: 1.012 (ref 1.005–1.030)
pH: 7 (ref 5.0–8.0)

## 2023-01-11 MED ORDER — SODIUM CHLORIDE 0.9 % IV SOLN
10.0000 mg | Freq: Once | INTRAVENOUS | Status: AC
  Administered 2023-01-11: 10 mg via INTRAVENOUS
  Filled 2023-01-11: qty 1

## 2023-01-11 MED ORDER — SODIUM CHLORIDE 0.9 % IV SOLN
2000.0000 mg/m2 | INTRAVENOUS | Status: DC
  Administered 2023-01-11: 3500 mg via INTRAVENOUS
  Filled 2023-01-11: qty 70

## 2023-01-11 MED ORDER — FLUOROURACIL CHEMO INJECTION 2.5 GM/50ML
400.0000 mg/m2 | Freq: Once | INTRAVENOUS | Status: AC
  Administered 2023-01-11: 750 mg via INTRAVENOUS
  Filled 2023-01-11: qty 15

## 2023-01-11 MED ORDER — PALONOSETRON HCL INJECTION 0.25 MG/5ML
0.2500 mg | Freq: Once | INTRAVENOUS | Status: AC
  Administered 2023-01-11: 0.25 mg via INTRAVENOUS
  Filled 2023-01-11: qty 5

## 2023-01-11 MED ORDER — SODIUM CHLORIDE 0.9 % IV SOLN
400.0000 mg/m2 | Freq: Once | INTRAVENOUS | Status: AC
  Administered 2023-01-11: 728 mg via INTRAVENOUS
  Filled 2023-01-11: qty 36.4

## 2023-01-11 MED ORDER — ATROPINE SULFATE 1 MG/ML IV SOLN
0.5000 mg | Freq: Once | INTRAVENOUS | Status: AC | PRN
  Administered 2023-01-11: 0.5 mg via INTRAVENOUS
  Filled 2023-01-11: qty 1

## 2023-01-11 MED ORDER — SODIUM CHLORIDE 0.9 % IV SOLN: Freq: Once | INTRAVENOUS | Status: AC

## 2023-01-11 MED ORDER — SODIUM CHLORIDE 0.9 % IV SOLN
135.0000 mg/m2 | Freq: Once | INTRAVENOUS | Status: AC
  Administered 2023-01-11: 240 mg via INTRAVENOUS
  Filled 2023-01-11: qty 10

## 2023-01-11 NOTE — Progress Notes (Signed)
Patient seen by Dr. Benay Spice today  Vitals are within treatment parameters.OK to proceed w/BP 166/87 (no intervention needed)  Labs reviewed by Dr. Benay Spice and are within treatment parameters.  Per physician team, patient is ready for treatment. Please note that modifications are being made to the treatment plan including MD has dose reduced the irinotecan due to diarrhea

## 2023-01-11 NOTE — Patient Instructions (Signed)
[place]  The chemotherapy medication bag should finish at 46 hours, 96 hours, or 7 days. For example, if your pump is scheduled for 46 hours and it was put on at 4:00 p.m., it should finish at 2:00 p.m. the day it is scheduled to come off regardless of your appointment time.     Estimated time to finish at 11:30am Wednesday, January 13, 2023.   If the display on your pump reads "Low Volume" and it is beeping, take the batteries out of the pump and come to the cancer center for it to be taken off.   If the pump alarms go off prior to the pump reading "Low Volume" then call [phone] and someone can assist you.  If the plunger comes out and the chemotherapy medication is leaking out, please use your home chemo spill kit to clean up the spill. Do NOT use paper towels or other household products.  If you have problems or questions regarding your pump, please call either [phone] (24 hours a day) or the cancer center Monday-Friday 8:00 a.m.- 4:30 p.m. at the clinic number and we will assist you. If you are unable to get assistance, then go to the nearest Emergency Department and ask the staff to contact the IV team for assistance.   Discharge Instructions: Thank you for choosing [facility] to provide your oncology and hematology care.   If you have a lab appointment with the [facility], please go directly to the [facility] and check in at the registration area.   Wear comfortable clothing and clothing appropriate for easy access to any Portacath or PICC line.   We strive to give you quality time with your provider. You may need to reschedule your appointment if you arrive late (15 or more minutes).  Arriving late affects you and other patients whose appointments are after yours.  Also, if you miss three or more appointments without notifying the office, you may be dismissed from the clinic at the provider's discretion.      For  prescription refill requests, have your pharmacy contact our office and allow 72 hours for refills to be completed.    Today you received the following chemotherapy and/or immunotherapy agents Irinotecan, Leucovorin, Flurouracil      To help prevent nausea and vomiting after your treatment, we encourage you to take your nausea medication as directed.  BELOW ARE SYMPTOMS THAT SHOULD BE REPORTED IMMEDIATELY: *FEVER GREATER THAN 100.4 F (38 C) OR HIGHER *CHILLS OR SWEATING *NAUSEA AND VOMITING THAT IS NOT CONTROLLED WITH YOUR NAUSEA MEDICATION *UNUSUAL SHORTNESS OF BREATH *UNUSUAL BRUISING OR BLEEDING *URINARY PROBLEMS (pain or burning when urinating, or frequent urination) *BOWEL PROBLEMS (unusual diarrhea, constipation, pain near the anus) TENDERNESS IN MOUTH AND THROAT WITH OR WITHOUT PRESENCE OF ULCERS (sore throat, sores in mouth, or a toothache) UNUSUAL RASH, SWELLING OR PAIN  UNUSUAL VAGINAL DISCHARGE OR ITCHING   Items with * indicate a potential emergency and should be followed up as soon as possible or go to the Emergency Department if any problems should occur.  Please show the CHEMOTHERAPY ALERT CARD or IMMUNOTHERAPY ALERT CARD at check-in to the Emergency Department and triage nurse.  Should you have questions after your visit or need to cancel or reschedule your appointment, please contact [facility]  Dept: [phone]  and follow the prompts.  Office hours are 8:00 a.m. to 4:30 p.m. Monday - Friday. Please note  that voicemails left after 4:00 p.m. may not be returned until the following business day.  We are closed weekends and major holidays. You have access to a nurse at all times for urgent questions. Please call the main number to the clinic Dept: [phone] and follow the prompts.   For any non-urgent questions, you may also contact your provider using MyChart. We now offer e-Visits for anyone 91 and older to request care online for  non-urgent symptoms. For details visit [email or web address].   Also download the MyChart app! Go to the app store, search "MyChart", open the app, select [facility], and log in with your MyChart username and password.  Irinotecan Injection What is this medication? IRINOTECAN (ir in oh TEE kan) treats some types of cancer. It works by slowing down the growth of cancer cells. This medicine may be used for other purposes; ask your health care provider or pharmacist if you have questions. COMMON BRAND NAME(S): Camptosar What should I tell my care team before I take this medication? They need to know if you have any of these conditions: Dehydration Diarrhea Infection, especially a viral infection, such as chickenpox, cold sores, herpes Liver disease Low blood cell levels (white cells, red cells, and platelets) Low levels of electrolytes, such as calcium, magnesium, or potassium in your blood Recent or ongoing radiation An unusual or allergic reaction to irinotecan, other medications, foods, dyes, or preservatives If you or your partner are pregnant or trying to get pregnant Breast-feeding How should I use this medication? This medication is injected into a vein. It is given by your care team in a hospital or clinic setting. Talk to your care team about the use of this medication in children. Special care may be needed. Overdosage: If you think you have taken too much of this medicine contact a poison control center or emergency room at once. NOTE: This medicine is only for you. Do not share this medicine with others. What if I miss a dose? Keep appointments for follow-up doses. It is important not to miss your dose. Call your care team if you are unable to keep an appointment. What may interact with this medication? Do not take this medication with any of the following: Cobicistat Itraconazole This medication may also interact with the following: Certain antibiotics, such as  clarithromycin, rifampin, rifabutin Certain antivirals for HIV or AIDS Certain medications for fungal infections, such as ketoconazole, posaconazole, voriconazole Certain medications for seizures, such as carbamazepine, phenobarbital, phenytoin Gemfibrozil Nefazodone St. John's wort This list may not describe all possible interactions. Give your health care provider a list of all the medicines, herbs, non-prescription drugs, or dietary supplements you use. Also tell them if you smoke, drink alcohol, or use illegal drugs. Some items may interact with your medicine. What should I watch for while using this medication? Your condition will be monitored carefully while you are receiving this medication. You may need blood work while taking this medication. This medication may make you feel generally unwell. This is not uncommon as chemotherapy can affect healthy cells as well as cancer cells. Report any side effects. Continue your course of treatment even though you feel ill unless your care team tells you to stop. This medication can cause serious side effects. To reduce the risk, your care team may give you other medications to take before receiving this one. Be sure to follow the directions from your care team. This medication may affect your coordination, reaction time, or judgement. Do not drive or  operate machinery until you know how this medication affects you. Sit up or stand slowly to reduce the risk of dizzy or fainting spells. Drinking alcohol with this medication can increase the risk of these side effects. This medication may increase your risk of getting an infection. Call your care team for advice if you get a fever, chills, sore throat, or other symptoms of a cold or flu. Do not treat yourself. Try to avoid being around people who are sick. Avoid taking medications that contain aspirin, acetaminophen, ibuprofen, naproxen, or ketoprofen unless instructed by your care team. These medications  may hide a fever. This medication may increase your risk to bruise or bleed. Call your care team if you notice any unusual bleeding. Be careful brushing or flossing your teeth or using a toothpick because you may get an infection or bleed more easily. If you have any dental work done, tell your dentist you are receiving this medication. Talk to your care team if you or your partner are pregnant or think either of you might be pregnant. This medication can cause serious birth defects if taken during pregnancy and for 6 months after the last dose. You will need a negative pregnancy test before starting this medication. Contraception is recommended while taking this medication and for 6 months after the last dose. Your care team can help you find the option that works for you. Do not father a child while taking this medication and for 3 months after the last dose. Use a condom for contraception during this time period. Do not breastfeed while taking this medication and for 7 days after the last dose. This medication may cause infertility. Talk to your care team if you are concerned about your fertility. What side effects may I notice from receiving this medication? Side effects that you should report to your care team as soon as possible: Allergic reactions--skin rash, itching, hives, swelling of the face, lips, tongue, or throat Dry cough, shortness of breath or trouble breathing Increased saliva or tears, increased sweating, stomach cramping, diarrhea, small pupils, unusual weakness or fatigue, slow heartbeat Infection--fever, chills, cough, sore throat, wounds that don't heal, pain or trouble when passing urine, general feeling of discomfort or being unwell Kidney injury--decrease in the amount of urine, swelling of the ankles, hands, or feet Low red blood cell level--unusual weakness or fatigue, dizziness, headache, trouble breathing Severe or prolonged diarrhea Unusual bruising or bleeding Side  effects that usually do not require medical attention (report to your care team if they continue or are bothersome): Constipation Diarrhea Hair loss Loss of appetite Nausea Stomach pain This list may not describe all possible side effects. Call your doctor for medical advice about side effects. You may report side effects to FDA at 1-800-FDA-1088. Where should I keep my medication? This medication is given in a hospital or clinic. It will not be stored at home. NOTE: This sheet is a summary. It may not cover all possible information. If you have questions about this medicine, talk to your doctor, pharmacist, or health care provider.  2023 Elsevier/Gold Standard (2022-02-26 00:00:00)  Leucovorin Injection What is this medication? LEUCOVORIN (loo koe VOR in) prevents side effects from certain medications, such as methotrexate. It works by increasing folate levels. This helps protect healthy cells in your body. It may also be used to treat anemia caused by low levels of folate. It can also be used with fluorouracil, a type of chemotherapy, to treat colorectal cancer. It works by increasing the  effects of fluorouracil in the body. This medicine may be used for other purposes; ask your health care provider or pharmacist if you have questions. What should I tell my care team before I take this medication? They need to know if you have any of these conditions: Anemia from low levels of vitamin B12 in the blood An unusual or allergic reaction to leucovorin, folic acid, other medications, foods, dyes, or preservatives Pregnant or trying to get pregnant Breastfeeding How should I use this medication? This medication is injected into a vein or a muscle. It is given by your care team in a hospital or clinic setting. Talk to your care team about the use of this medication in children. Special care may be needed. Overdosage: If you think you have taken too much of this medicine contact a poison control  center or emergency room at once. NOTE: This medicine is only for you. Do not share this medicine with others. What if I miss a dose? Keep appointments for follow-up doses. It is important not to miss your dose. Call your care team if you are unable to keep an appointment. What may interact with this medication? Capecitabine Fluorouracil Phenobarbital Phenytoin Primidone Trimethoprim;sulfamethoxazole This list may not describe all possible interactions. Give your health care provider a list of all the medicines, herbs, non-prescription drugs, or dietary supplements you use. Also tell them if you smoke, drink alcohol, or use illegal drugs. Some items may interact with your medicine. What should I watch for while using this medication? Your condition will be monitored carefully while you are receiving this medication. This medication may increase the side effects of 5-fluorouracil. Tell your care team if you have diarrhea or mouth sores that do not get better or that get worse. What side effects may I notice from receiving this medication? Side effects that you should report to your care team as soon as possible: Allergic reactions--skin rash, itching, hives, swelling of the face, lips, tongue, or throat This list may not describe all possible side effects. Call your doctor for medical advice about side effects. You may report side effects to FDA at 1-800-FDA-1088. Where should I keep my medication? This medication is given in a hospital or clinic. It will not be stored at home. NOTE: This sheet is a summary. It may not cover all possible information. If you have questions about this medicine, talk to your doctor, pharmacist, or health care provider.  2023 Elsevier/Gold Standard (2022-03-24 00:00:00)  Fluorouracil Injection What is this medication? FLUOROURACIL (flure oh YOOR a sil) treats some types of cancer. It works by slowing down the growth of cancer cells. This medicine may be used  for other purposes; ask your health care provider or pharmacist if you have questions. COMMON BRAND NAME(S): Adrucil What should I tell my care team before I take this medication? They need to know if you have any of these conditions: Blood disorders Dihydropyrimidine dehydrogenase (DPD) deficiency Infection, such as chickenpox, cold sores, herpes Kidney disease Liver disease Poor nutrition Recent or ongoing radiation therapy An unusual or allergic reaction to fluorouracil, other medications, foods, dyes, or preservatives If you or your partner are pregnant or trying to get pregnant Breast-feeding How should I use this medication? This medication is injected into a vein. It is administered by your care team in a hospital or clinic setting. Talk to your care team about the use of this medication in children. Special care may be needed. Overdosage: If you think you have taken  too much of this medicine contact a poison control center or emergency room at once. NOTE: This medicine is only for you. Do not share this medicine with others. What if I miss a dose? Keep appointments for follow-up doses. It is important not to miss your dose. Call your care team if you are unable to keep an appointment. What may interact with this medication? Do not take this medication with any of the following: Live virus vaccines This medication may also interact with the following: Medications that treat or prevent blood clots, such as warfarin, enoxaparin, dalteparin This list may not describe all possible interactions. Give your health care provider a list of all the medicines, herbs, non-prescription drugs, or dietary supplements you use. Also tell them if you smoke, drink alcohol, or use illegal drugs. Some items may interact with your medicine. What should I watch for while using this medication? Your condition will be monitored carefully while you are receiving this medication. This medication may make  you feel generally unwell. This is not uncommon as chemotherapy can affect healthy cells as well as cancer cells. Report any side effects. Continue your course of treatment even though you feel ill unless your care team tells you to stop. In some cases, you may be given additional medications to help with side effects. Follow all directions for their use. This medication may increase your risk of getting an infection. Call your care team for advice if you get a fever, chills, sore throat, or other symptoms of a cold or flu. Do not treat yourself. Try to avoid being around people who are sick. This medication may increase your risk to bruise or bleed. Call your care team if you notice any unusual bleeding. Be careful brushing or flossing your teeth or using a toothpick because you may get an infection or bleed more easily. If you have any dental work done, tell your dentist you are receiving this medication. Avoid taking medications that contain aspirin, acetaminophen, ibuprofen, naproxen, or ketoprofen unless instructed by your care team. These medications may hide a fever. Do not treat diarrhea with over the counter products. Contact your care team if you have diarrhea that lasts more than 2 days or if it is severe and watery. This medication can make you more sensitive to the sun. Keep out of the sun. If you cannot avoid being in the sun, wear protective clothing and sunscreen. Do not use sun lamps, tanning beds, or tanning booths. Talk to your care team if you or your partner wish to become pregnant or think you might be pregnant. This medication can cause serious birth defects if taken during pregnancy and for 3 months after the last dose. A reliable form of contraception is recommended while taking this medication and for 3 months after the last dose. Talk to your care team about effective forms of contraception. Do not father a child while taking this medication and for 3 months after the last dose.  Use a condom while having sex during this time period. Do not breastfeed while taking this medication. This medication may cause infertility. Talk to your care team if you are concerned about your fertility. What side effects may I notice from receiving this medication? Side effects that you should report to your care team as soon as possible: Allergic reactions--skin rash, itching, hives, swelling of the face, lips, tongue, or throat Heart attack--pain or tightness in the chest, shoulders, arms, or jaw, nausea, shortness of breath, cold or clammy skin,  feeling faint or lightheaded Heart failure--shortness of breath, swelling of the ankles, feet, or hands, sudden weight gain, unusual weakness or fatigue Heart rhythm changes--fast or irregular heartbeat, dizziness, feeling faint or lightheaded, chest pain, trouble breathing High ammonia level--unusual weakness or fatigue, confusion, loss of appetite, nausea, vomiting, seizures Infection--fever, chills, cough, sore throat, wounds that don't heal, pain or trouble when passing urine, general feeling of discomfort or being unwell Low red blood cell level--unusual weakness or fatigue, dizziness, headache, trouble breathing Pain, tingling, or numbness in the hands or feet, muscle weakness, change in vision, confusion or trouble speaking, loss of balance or coordination, trouble walking, seizures Redness, swelling, and blistering of the skin over hands and feet Severe or prolonged diarrhea Unusual bruising or bleeding Side effects that usually do not require medical attention (report to your care team if they continue or are bothersome): Dry skin Headache Increased tears Nausea Pain, redness, or swelling with sores inside the mouth or throat Sensitivity to light Vomiting This list may not describe all possible side effects. Call your doctor for medical advice about side effects. You may report side effects to FDA at 1-800-FDA-1088. Where should I  keep my medication? This medication is given in a hospital or clinic. It will not be stored at home. NOTE: This sheet is a summary. It may not cover all possible information. If you have questions about this medicine, talk to your doctor, pharmacist, or health care provider.  2023 Elsevier/Gold Standard (2022-02-17 00:00:00)

## 2023-01-11 NOTE — Progress Notes (Signed)
[place] OFFICE PROGRESS NOTE   Diagnosis: Colon cancer  INTERVAL HISTORY:   Aaron Roth returns as scheduled.  He completed 1 cycle of FOLFIRI.  Treatment has been on hold over the past months due to neutropenia and tooth extractions.  He underwent multiple tooth extractions 12/31/2022.  He is planning on obtaining dentures.  Aaron Roth reports 1 episode of nausea/vomiting following FOLFIRI.  He had intermittent diarrhea immediately following chemotherapy.  This has resolved.  The abdominal mass is smaller and remains firm.  Objective:  Vital signs in last 24 hours:  Blood pressure (!) 166/87, pulse 82, temperature 98.1 F (36.7 C), temperature source Oral, resp. rate 18, height '5\' 7"'$  (1.702 m), weight 154 lb (69.9 kg), SpO2 99 %.    HEENT: Edentulous, no thrush or ulcers Resp: Lungs clear bilaterally Cardio: Regular rate and rhythm GI: No hepatosplenomegaly, firm mass occupying the mid abdomen Vascular: No leg edema    Portacath/PICC-without erythema  Lab Results:  Lab Results  Component Value Date   WBC 7.5 01/11/2023   HGB 8.9 (L) 01/11/2023   HCT 29.8 (L) 01/11/2023   MCV 72.3 (L) 01/11/2023   PLT 468 (H) 01/11/2023   NEUTROABS 5.3 01/11/2023    CMP  Lab Results  Component Value Date   NA 138 12/28/2022   K 3.7 12/28/2022   CL 102 12/28/2022   CO2 28 12/28/2022   GLUCOSE 131 (H) 12/28/2022   BUN 10 12/28/2022   CREATININE 0.90 12/28/2022   CALCIUM 8.4 (L) 12/28/2022   PROT 6.2 (L) 12/28/2022   ALBUMIN 3.1 (L) 12/28/2022   AST 11 (L) 12/28/2022   ALT 7 12/28/2022   ALKPHOS 82 12/28/2022   BILITOT 0.4 12/28/2022   GFRNONAA >60 12/28/2022   GFRAA 80 10/09/2020    Lab Results  Component Value Date   CEA1 2.0 08/04/2020   CEA 5.12 (H) 12/07/2022    Medications: I have reviewed the patient's current medications.   Assessment/Plan: Colon cancer  CT abdomen/pelvis 08/02/2020-small amount of free fluid in the pelvis, postsurgical change  involving the left colon, trace right pleural fluid.   Colonoscopy 08/04/2020-cecal mass as well as a 3 mm polyp in the ascending colon, 9 mm polyp in the ascending colon, 3 mm polyp in the transverse colon and 4 small polyps in the transverse colon.  Biopsy of the cecal mass showed adenocarcinoma.  Pathology on the polyps showed tubular adenomas with no high-grade dysplasia or malignancy identified. CT chest 08/04/2020-no evidence of metastatic disease CEA 08/04/2020-2.0 Laparoscopic right colectomy 10/17/2020 by Dr. Dorothyann Gibbs showed adenocarcinoma with mucinous features, moderately differentiated, 6.7 cm; no carcinoma identified and 23 lymph nodes; margins uninvolved; no macroscopic tumor perforation identified; tumor invaded through muscularis propria into pericolorectal tissue; no lymphovascular invasion and no perineural invasion, pT3, pN0, mismatch repair protein IHC normal, MSI stable.   06/18/2022-CT abdomen/pelvis-homogenous soft tissue mass in the anterior abdomen deep to the umbilicus, 5 mm left lower lobe nodule, new from October 2021 11/04/2022 biopsy of abdominal mass-metastatic mucinous adenocarcinoma, mismatch repair protein IHC normal, foundation 1 pending CTs 11/18/2022-large mass central lower abdomen is increased in size.  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  No evidence of obstruction.  Irregular lesions right lobe of the liver likely new, compatible with metastatic disease.  Increase size of a solid left lower lobe pulmonary nodule likely due to metastatic disease.  Solid  pulmonary nodule left upper lobe increased in size likely due to indolent primary lung malignancy. Foundation 1 testing (date of collection 10/17/2020, specimen received 11/24/2022)-microsatellite status is equivocal; tumor mutation burden 6; K-ras wild-type; NRAS G12S; BRAF [id number] Cycle 1 FOLFIRI  12/07/2022 Chemotherapy held 12/21/2022 due to neutropenia Chemotherapy held 12/28/2022 per patient due to upcoming dental extractions Cycle 2 FOLFIRI 01/11/2023, irinotecan dose reduced secondary to diarrhea neutropenia, Udenyca Iron deficiency anemia October 2021  08/02/2020 hemoglobin 3.8, MCV 56, ferritin 3, stool positive for occult blood 10/19/2020 hemoglobin 7.3, MCV 74 11/04/2022 hemoglobin 4.5 11/05/2022 ferritin 3 Ferrous sulfate 325 mg twice daily recommended 11/06/2022 2 units packed red blood cells 11/17/2022 stool cards positive for blood 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 He completed cycle 1 CAPOX beginning 11/13/2011; cycle 7 beginning 03/23/2012. Cycle 8 was completed beginning on 04/13/2012 without oxaliplatin. Restaging CT scans 06/06/2012 without evidence of recurrent disease.   Family history-brother with colon cancer Multiple polyps on colonoscopy 08/04/2020  Port-A-Cath placement 12/04/2022, Interventional Radiology     Disposition: [name] completed 1 cycle of FOLFIRI.  Cycle 2 was held secondary to neutropenia and then tooth extractions.  The white count has recovered.  He agrees to proceed with cycle 2 FOLFIRI today.  I dose reduced the irinotecan secondary to the history of neutropenia and diarrhea.  He will receive G-CSF support.  We reviewed potential toxicities associated with G-CSF.  He agrees to proceed.  The abdominal mass appears stable on exam today.  Aaron Roth will complete cycle 2 FOLFIRI today.  He will return for an office visit and chemotherapy in 2 weeks.  Betsy Coder, MD  01/11/2023  9:38 AM

## 2023-01-11 NOTE — Patient Instructions (Signed)

## 2023-01-13 ENCOUNTER — Inpatient Hospital Stay: Payer: Self-pay

## 2023-01-13 VITALS — BP 167/74 | HR 78 | Temp 98.4°F | Resp 18

## 2023-01-13 DIAGNOSIS — C182 Malignant neoplasm of ascending colon: Secondary | ICD-10-CM

## 2023-01-13 MED ORDER — PEGFILGRASTIM-CBQV 6 MG/0.6ML ~~LOC~~ SOSY
6.0000 mg | PREFILLED_SYRINGE | Freq: Once | SUBCUTANEOUS | Status: AC
  Administered 2023-01-13: 6 mg via SUBCUTANEOUS
  Filled 2023-01-13: qty 0.6

## 2023-01-13 MED ORDER — SODIUM CHLORIDE 0.9% FLUSH
10.0000 mL | INTRAVENOUS | Status: DC | PRN
  Administered 2023-01-13: 10 mL

## 2023-01-13 MED ORDER — HEPARIN SOD (PORK) LOCK FLUSH 100 UNIT/ML IV SOLN
500.0000 [IU] | Freq: Once | INTRAVENOUS | Status: AC | PRN
  Administered 2023-01-13: 500 [IU]

## 2023-01-13 NOTE — Patient Instructions (Signed)
Implanted Port Home Guide An implanted port is a device that is placed under the skin. It is usually placed in the chest. The device may vary based on the need. Implanted ports can be used to give IV medicine, to take blood, or to give fluids. You may have an implanted port if: You need IV medicine that would be irritating to the small veins in your hands or arms. You need IV medicines, such as chemotherapy, for a long period of time. You need IV nutrition for a long period of time. You may have fewer limitations when using a port than you would if you used other types of long-term IVs. You will also likely be able to return to normal activities after your incision heals. An implanted port has two main parts: Reservoir. The reservoir is the part where a needle is inserted to give medicines or draw blood. The reservoir is round. After the port is placed, it appears as a small, raised area under your skin. Catheter. The catheter is a small, thin tube that connects the reservoir to a vein. Medicine that is inserted into the reservoir goes into the catheter and then into the vein. How is my port accessed? To access your port: A numbing cream may be placed on the skin over the port site. Your health care provider will put on a mask and sterile gloves. The skin over your port will be cleaned carefully with a germ-killing soap and allowed to dry. Your health care provider will gently pinch the port and insert a needle into it. Your health care provider will check for a blood return to make sure the port is in the vein and is still working (patent). If your port needs to remain accessed to get medicine continuously (constant infusion), your health care provider will place a clear bandage (dressing) over the needle site. The dressing and needle will need to be changed every week, or as told by your health care provider. What is flushing? Flushing helps keep the port working. Follow instructions from your  health care provider about how and when to flush the port. Ports are usually flushed with saline solution or a medicine called heparin. The need for flushing will depend on how the port is used: If the port is only used from time to time to give medicines or draw blood, the port may need to be flushed: Before and after medicines have been given. Before and after blood has been drawn. As part of routine maintenance. Flushing may be recommended every 4-6 weeks. If a constant infusion is running, the port may not need to be flushed. Throw away any syringes in a disposal container that is meant for sharp items (sharps container). You can buy a sharps container from a pharmacy, or you can make one by using an empty hard plastic bottle with a cover. How long will my port stay implanted? The port can stay in for as long as your health care provider thinks it is needed. When it is time for the port to come out, a surgery will be done to remove it. The surgery will be similar to the procedure that was done to put the port in. Follow these instructions at home: Caring for your port and port site Flush your port as told by your health care provider. If you need an infusion over several days, follow instructions from your health care provider about how to take care of your port site. Make sure you: Change your   dressing as told by your health care provider. Wash your hands with soap and water for at least 20 seconds before and after you change your dressing. If soap and water are not available, use alcohol-based hand sanitizer. Place any used dressings or infusion bags into a plastic bag. Throw that bag in the trash. Keep the dressing that covers the needle clean and dry. Do not get it wet. Do not use scissors or sharp objects near the infusion tubing. Keep any external tubes clamped, unless they are being used. Check your port site every day for signs of infection. Check for: Redness, swelling, or  pain. Fluid or blood. Warmth. Pus or a bad smell. Protect the skin around the port site. Avoid wearing bra straps that rub or irritate the site. Protect the skin around your port from seat belts. Place a soft pad over your chest if needed. Bathe or shower as told by your health care provider. The site may get wet as long as you are not actively receiving an infusion. General instructions  Return to your normal activities as told by your health care provider. Ask your health care provider what activities are safe for you. Carry a medical alert card or wear a medical alert bracelet at all times. This will let health care providers know that you have an implanted port in case of an emergency. Where to find more information American Cancer Society: www.cancer.org American Society of Clinical Oncology: www.cancer.net Contact a health care provider if: You have a fever or chills. You have redness, swelling, or pain at the port site. You have fluid or blood coming from your port site. Your incision feels warm to the touch. You have pus or a bad smell coming from the port site. Summary Implanted ports are usually placed in the chest for long-term IV access. Follow instructions from your health care provider about flushing the port and changing bandages (dressings). Take care of the area around your port by avoiding clothing that puts pressure on the area, and by watching for signs of infection. Protect the skin around your port from seat belts. Place a soft pad over your chest if needed. Contact a health care provider if you have a fever or you have redness, swelling, pain, fluid, or a bad smell at the port site. This information is not intended to replace advice given to you by your health care provider. Make sure you discuss any questions you have with your health care provider. Document Revised: 04/22/2021 Document Reviewed: 04/22/2021 Elsevier Patient Education  2023 Elsevier  Inc.  Pegfilgrastim Injection What is this medication? PEGFILGRASTIM (PEG fil gra stim) lowers the risk of infection in people who are receiving chemotherapy. It works by helping your body make more white blood cells, which protects your body from infection. It may also be used to help people who have been exposed to high doses of radiation. This medicine may be used for other purposes; ask your health care provider or pharmacist if you have questions. COMMON BRAND NAME(S): Fulphila, Fylnetra, Neulasta, Nyvepria, Stimufend, UDENYCA, Ziextenzo What should I tell my care team before I take this medication? They need to know if you have any of these conditions: Kidney disease Latex allergy Ongoing radiation therapy Sickle cell disease Skin reactions to acrylic adhesives (On-Body Injector only) An unusual or allergic reaction to pegfilgrastim, filgrastim, other medications, foods, dyes, or preservatives Pregnant or trying to get pregnant Breast-feeding How should I use this medication? This medication is for injection under the   skin. If you get this medication at home, you will be taught how to prepare and give the pre-filled syringe or how to use the On-body Injector. Refer to the patient Instructions for Use for detailed instructions. Use exactly as directed. Tell your care team immediately if you suspect that the On-body Injector may not have performed as intended or if you suspect the use of the On-body Injector resulted in a missed or partial dose. It is important that you put your used needles and syringes in a special sharps container. Do not put them in a trash can. If you do not have a sharps container, call your pharmacist or care team to get one. Talk to your care team about the use of this medication in children. While this medication may be prescribed for selected conditions, precautions do apply. Overdosage: If you think you have taken too much of this medicine contact a poison control  center or emergency room at once. NOTE: This medicine is only for you. Do not share this medicine with others. What if I miss a dose? It is important not to miss your dose. Call your care team if you miss your dose. If you miss a dose due to an On-body Injector failure or leakage, a new dose should be administered as soon as possible using a single prefilled syringe for manual use. What may interact with this medication? Interactions have not been studied. This list may not describe all possible interactions. Give your health care provider a list of all the medicines, herbs, non-prescription drugs, or dietary supplements you use. Also tell them if you smoke, drink alcohol, or use illegal drugs. Some items may interact with your medicine. What should I watch for while using this medication? Your condition will be monitored carefully while you are receiving this medication. You may need blood work done while you are taking this medication. Talk to your care team about your risk of cancer. You may be more at risk for certain types of cancer if you take this medication. If you are going to need a MRI, CT scan, or other procedure, tell your care team that you are using this medication (On-Body Injector only). What side effects may I notice from receiving this medication? Side effects that you should report to your care team as soon as possible: Allergic reactions--skin rash, itching, hives, swelling of the face, lips, tongue, or throat Capillary leak syndrome--stomach or muscle pain, unusual weakness or fatigue, feeling faint or lightheaded, decrease in the amount of urine, swelling of the ankles, hands, or feet, trouble breathing High white blood cell level--fever, fatigue, trouble breathing, night sweats, change in vision, weight loss Inflammation of the aorta--fever, fatigue, back, chest, or stomach pain, severe headache Kidney injury (glomerulonephritis)--decrease in the amount of urine, red or dark  brown urine, foamy or bubbly urine, swelling of the ankles, hands, or feet Shortness of breath or trouble breathing Spleen injury--pain in upper left stomach or shoulder Unusual bruising or bleeding Side effects that usually do not require medical attention (report to your care team if they continue or are bothersome): Bone pain Pain in the hands or feet This list may not describe all possible side effects. Call your doctor for medical advice about side effects. You may report side effects to FDA at 1-800-FDA-1088. Where should I keep my medication? Keep out of the reach of children. If you are using this medication at home, you will be instructed on how to store it. Throw away any unused   medication after the expiration date on the label. NOTE: This sheet is a summary. It may not cover all possible information. If you have questions about this medicine, talk to your doctor, pharmacist, or health care provider.  2023 Elsevier/Gold Standard (2021-07-04 00:00:00)  

## 2023-01-14 ENCOUNTER — Other Ambulatory Visit: Payer: Self-pay

## 2023-01-18 ENCOUNTER — Ambulatory Visit: Payer: Self-pay | Admitting: Oncology

## 2023-01-18 ENCOUNTER — Other Ambulatory Visit: Payer: Self-pay

## 2023-01-18 ENCOUNTER — Ambulatory Visit: Payer: Self-pay

## 2023-01-24 ENCOUNTER — Other Ambulatory Visit: Payer: Self-pay | Admitting: Oncology

## 2023-01-24 DIAGNOSIS — C182 Malignant neoplasm of ascending colon: Secondary | ICD-10-CM

## 2023-01-25 ENCOUNTER — Inpatient Hospital Stay: Payer: Self-pay

## 2023-01-25 ENCOUNTER — Inpatient Hospital Stay (HOSPITAL_BASED_OUTPATIENT_CLINIC_OR_DEPARTMENT_OTHER): Payer: Self-pay | Admitting: Oncology

## 2023-01-25 ENCOUNTER — Encounter: Payer: Self-pay | Admitting: *Deleted

## 2023-01-25 ENCOUNTER — Encounter: Payer: Self-pay | Admitting: Oncology

## 2023-01-25 VITALS — BP 164/81 | HR 76 | Temp 98.1°F | Resp 18 | Ht 67.0 in | Wt 159.2 lb

## 2023-01-25 VITALS — BP 127/82 | HR 67

## 2023-01-25 DIAGNOSIS — C182 Malignant neoplasm of ascending colon: Secondary | ICD-10-CM

## 2023-01-25 LAB — CMP (CANCER CENTER ONLY)
ALT: 5 U/L (ref 0–44)
AST: 9 U/L — ABNORMAL LOW (ref 15–41)
Albumin: 3.1 g/dL — ABNORMAL LOW (ref 3.5–5.0)
Alkaline Phosphatase: 107 U/L (ref 38–126)
Anion gap: 5 (ref 5–15)
BUN: 9 mg/dL (ref 8–23)
CO2: 31 mmol/L (ref 22–32)
Calcium: 8.5 mg/dL — ABNORMAL LOW (ref 8.9–10.3)
Chloride: 103 mmol/L (ref 98–111)
Creatinine: 0.74 mg/dL (ref 0.61–1.24)
GFR, Estimated: >60 mL/min (ref >60)
Glucose, Bld: 88 mg/dL (ref 70–99)
Potassium: 3.4 mmol/L — ABNORMAL LOW (ref 3.5–5.1)
Sodium: 139 mmol/L (ref 135–145)
Total Bilirubin: 0.3 mg/dL (ref 0.3–1.2)
Total Protein: 6 g/dL — ABNORMAL LOW (ref 6.5–8.1)

## 2023-01-25 LAB — CBC WITH DIFFERENTIAL (CANCER CENTER ONLY)
Abs Immature Granulocytes: 0.11 10*3/uL — ABNORMAL HIGH (ref 0.00–0.07)
Basophils Absolute: 0.1 10*3/uL (ref 0.0–0.1)
Basophils Relative: 1 %
Eosinophils Absolute: 0.3 10*3/uL (ref 0.0–0.5)
Eosinophils Relative: 3 %
HCT: 26.3 % — ABNORMAL LOW (ref 39.0–52.0)
Hemoglobin: 7.9 g/dL — ABNORMAL LOW (ref 13.0–17.0)
Immature Granulocytes: 1 %
Lymphocytes Relative: 15 %
Lymphs Abs: 1.5 10*3/uL (ref 0.7–4.0)
MCH: 21.6 pg — ABNORMAL LOW (ref 26.0–34.0)
MCHC: 30 g/dL (ref 30.0–36.0)
MCV: 71.9 fL — ABNORMAL LOW (ref 80.0–100.0)
Monocytes Absolute: 0.6 10*3/uL (ref 0.1–1.0)
Monocytes Relative: 6 %
Neutro Abs: 7 10*3/uL (ref 1.7–7.7)
Neutrophils Relative %: 74 %
Platelet Count: 450 10*3/uL — ABNORMAL HIGH (ref 150–400)
RBC: 3.66 MIL/uL — ABNORMAL LOW (ref 4.22–5.81)
RDW: 25.2 % — ABNORMAL HIGH (ref 11.5–15.5)
WBC Count: 9.4 10*3/uL (ref 4.0–10.5)
nRBC: 0 % (ref 0.0–0.2)

## 2023-01-25 MED ORDER — SODIUM CHLORIDE 0.9 % IV SOLN: Freq: Once | INTRAVENOUS | Status: AC

## 2023-01-25 MED ORDER — SODIUM CHLORIDE 0.9 % IV SOLN
10.0000 mg | Freq: Once | INTRAVENOUS | Status: AC
  Administered 2023-01-25: 10 mg via INTRAVENOUS
  Filled 2023-01-25: qty 10

## 2023-01-25 MED ORDER — SODIUM CHLORIDE 0.9 % IV SOLN
400.0000 mg/m2 | Freq: Once | INTRAVENOUS | Status: AC
  Administered 2023-01-25: 728 mg via INTRAVENOUS
  Filled 2023-01-25: qty 36.4

## 2023-01-25 MED ORDER — SODIUM CHLORIDE 0.9 % IV SOLN
2000.0000 mg/m2 | INTRAVENOUS | Status: DC
  Administered 2023-01-25: 3500 mg via INTRAVENOUS
  Filled 2023-01-25: qty 70

## 2023-01-25 MED ORDER — ATROPINE SULFATE 1 MG/ML IV SOLN
0.5000 mg | Freq: Once | INTRAVENOUS | Status: AC | PRN
  Administered 2023-01-25: 0.5 mg via INTRAVENOUS
  Filled 2023-01-25: qty 1

## 2023-01-25 MED ORDER — PALONOSETRON HCL INJECTION 0.25 MG/5ML
0.2500 mg | Freq: Once | INTRAVENOUS | Status: AC
  Administered 2023-01-25: 0.25 mg via INTRAVENOUS
  Filled 2023-01-25: qty 5

## 2023-01-25 MED ORDER — SODIUM CHLORIDE 0.9 % IV SOLN
135.0000 mg/m2 | Freq: Once | INTRAVENOUS | Status: AC
  Administered 2023-01-25: 240 mg via INTRAVENOUS
  Filled 2023-01-25: qty 10

## 2023-01-25 MED ORDER — FLUOROURACIL CHEMO INJECTION 2.5 GM/50ML
400.0000 mg/m2 | Freq: Once | INTRAVENOUS | Status: AC
  Administered 2023-01-25: 750 mg via INTRAVENOUS
  Filled 2023-01-25: qty 15

## 2023-01-25 NOTE — Patient Instructions (Signed)

## 2023-01-25 NOTE — Progress Notes (Signed)
Patient seen by Dr. Benay Spice today  Vitals are within treatment parameters.No intervention needed for BP 164/81  Labs reviewed by Dr. Benay Spice and are not all within treatment parameters. Hgb 7.9 and K+ mildly low at 3.4  Per physician team, patient is ready for treatment and there are NO modifications to the treatment plan.

## 2023-01-25 NOTE — Patient Instructions (Addendum)
[facility]  The chemotherapy medication bag should finish at 46 hours, 96 hours, or 7 days. For example, if your pump is scheduled for 46 hours and it was put on at 4:00 p.m., it should finish at 2:00 p.m. the day it is scheduled to come off regardless of your appointment time.     Estimated time to finish at 12:15pm Wednesday, January 27, 2023.   If the display on your pump reads "Low Volume" and it is beeping, take the batteries out of the pump and come to the cancer center for it to be taken off.   If the pump alarms go off prior to the pump reading "Low Volume" then call [phone] and someone can assist you.  If the plunger comes out and the chemotherapy medication is leaking out, please use your home chemo spill kit to clean up the spill. Do NOT use paper towels or other household products.  If you have problems or questions regarding your pump, please call either [phone] (24 hours a day) or the cancer center Monday-Friday 8:00 a.m.- 4:30 p.m. at the clinic number and we will assist you. If you are unable to get assistance, then go to the nearest Emergency Department and ask the staff to contact the IV team for assistance.   Discharge Instructions: Thank you for choosing [facility] to provide your oncology and hematology care.   If you have a lab appointment with the [facility], please go directly to the [facility] and check in at the registration area.   Wear comfortable clothing and clothing appropriate for easy access to any Portacath or PICC line.   We strive to give you quality time with your provider. You may need to reschedule your appointment if you arrive late (15 or more minutes).  Arriving late affects you and other patients whose appointments are after yours.  Also, if you miss three or more appointments without notifying the office, you may be dismissed from the clinic at the provider's discretion.      For  prescription refill requests, have your pharmacy contact our office and allow 72 hours for refills to be completed.    Today you received the following chemotherapy and/or immunotherapy agents Irinotecan, Leucovorin, Flurouracil      To help prevent nausea and vomiting after your treatment, we encourage you to take your nausea medication as directed.  BELOW ARE SYMPTOMS THAT SHOULD BE REPORTED IMMEDIATELY: *FEVER GREATER THAN 100.4 F (38 C) OR HIGHER *CHILLS OR SWEATING *NAUSEA AND VOMITING THAT IS NOT CONTROLLED WITH YOUR NAUSEA MEDICATION *UNUSUAL SHORTNESS OF BREATH *UNUSUAL BRUISING OR BLEEDING *URINARY PROBLEMS (pain or burning when urinating, or frequent urination) *BOWEL PROBLEMS (unusual diarrhea, constipation, pain near the anus) TENDERNESS IN MOUTH AND THROAT WITH OR WITHOUT PRESENCE OF ULCERS (sore throat, sores in mouth, or a toothache) UNUSUAL RASH, SWELLING OR PAIN  UNUSUAL VAGINAL DISCHARGE OR ITCHING   Items with * indicate a potential emergency and should be followed up as soon as possible or go to the Emergency Department if any problems should occur.  Please show the CHEMOTHERAPY ALERT CARD or IMMUNOTHERAPY ALERT CARD at check-in to the Emergency Department and triage nurse.  Should you have questions after your visit or need to cancel or reschedule your appointment, please contact [facility]  Dept: [phone]  and follow the prompts.  Office hours are 8:00 a.m. to 4:30 p.m. Monday - Friday. Please note  that voicemails left after 4:00 p.m. may not be returned until the following business day.  We are closed weekends and major holidays. You have access to a nurse at all times for urgent questions. Please call the main number to the clinic Dept: [phone] and follow the prompts.   For any non-urgent questions, you may also contact your provider using MyChart. We now offer e-Visits for anyone 91 and older to request care online for  non-urgent symptoms. For details visit [email or web address].   Also download the MyChart app! Go to the app store, search "MyChart", open the app, select [facility], and log in with your MyChart username and password.  Irinotecan Injection What is this medication? IRINOTECAN (ir in oh TEE kan) treats some types of cancer. It works by slowing down the growth of cancer cells. This medicine may be used for other purposes; ask your health care provider or pharmacist if you have questions. COMMON BRAND NAME(S): Camptosar What should I tell my care team before I take this medication? They need to know if you have any of these conditions: Dehydration Diarrhea Infection, especially a viral infection, such as chickenpox, cold sores, herpes Liver disease Low blood cell levels (white cells, red cells, and platelets) Low levels of electrolytes, such as calcium, magnesium, or potassium in your blood Recent or ongoing radiation An unusual or allergic reaction to irinotecan, other medications, foods, dyes, or preservatives If you or your partner are pregnant or trying to get pregnant Breast-feeding How should I use this medication? This medication is injected into a vein. It is given by your care team in a hospital or clinic setting. Talk to your care team about the use of this medication in children. Special care may be needed. Overdosage: If you think you have taken too much of this medicine contact a poison control center or emergency room at once. NOTE: This medicine is only for you. Do not share this medicine with others. What if I miss a dose? Keep appointments for follow-up doses. It is important not to miss your dose. Call your care team if you are unable to keep an appointment. What may interact with this medication? Do not take this medication with any of the following: Cobicistat Itraconazole This medication may also interact with the following: Certain antibiotics, such as  clarithromycin, rifampin, rifabutin Certain antivirals for HIV or AIDS Certain medications for fungal infections, such as ketoconazole, posaconazole, voriconazole Certain medications for seizures, such as carbamazepine, phenobarbital, phenytoin Gemfibrozil Nefazodone St. John's wort This list may not describe all possible interactions. Give your health care provider a list of all the medicines, herbs, non-prescription drugs, or dietary supplements you use. Also tell them if you smoke, drink alcohol, or use illegal drugs. Some items may interact with your medicine. What should I watch for while using this medication? Your condition will be monitored carefully while you are receiving this medication. You may need blood work while taking this medication. This medication may make you feel generally unwell. This is not uncommon as chemotherapy can affect healthy cells as well as cancer cells. Report any side effects. Continue your course of treatment even though you feel ill unless your care team tells you to stop. This medication can cause serious side effects. To reduce the risk, your care team may give you other medications to take before receiving this one. Be sure to follow the directions from your care team. This medication may affect your coordination, reaction time, or judgement. Do not drive or  operate machinery until you know how this medication affects you. Sit up or stand slowly to reduce the risk of dizzy or fainting spells. Drinking alcohol with this medication can increase the risk of these side effects. This medication may increase your risk of getting an infection. Call your care team for advice if you get a fever, chills, sore throat, or other symptoms of a cold or flu. Do not treat yourself. Try to avoid being around people who are sick. Avoid taking medications that contain aspirin, acetaminophen, ibuprofen, naproxen, or ketoprofen unless instructed by your care team. These medications  may hide a fever. This medication may increase your risk to bruise or bleed. Call your care team if you notice any unusual bleeding. Be careful brushing or flossing your teeth or using a toothpick because you may get an infection or bleed more easily. If you have any dental work done, tell your dentist you are receiving this medication. Talk to your care team if you or your partner are pregnant or think either of you might be pregnant. This medication can cause serious birth defects if taken during pregnancy and for 6 months after the last dose. You will need a negative pregnancy test before starting this medication. Contraception is recommended while taking this medication and for 6 months after the last dose. Your care team can help you find the option that works for you. Do not father a child while taking this medication and for 3 months after the last dose. Use a condom for contraception during this time period. Do not breastfeed while taking this medication and for 7 days after the last dose. This medication may cause infertility. Talk to your care team if you are concerned about your fertility. What side effects may I notice from receiving this medication? Side effects that you should report to your care team as soon as possible: Allergic reactions--skin rash, itching, hives, swelling of the face, lips, tongue, or throat Dry cough, shortness of breath or trouble breathing Increased saliva or tears, increased sweating, stomach cramping, diarrhea, small pupils, unusual weakness or fatigue, slow heartbeat Infection--fever, chills, cough, sore throat, wounds that don't heal, pain or trouble when passing urine, general feeling of discomfort or being unwell Kidney injury--decrease in the amount of urine, swelling of the ankles, hands, or feet Low red blood cell level--unusual weakness or fatigue, dizziness, headache, trouble breathing Severe or prolonged diarrhea Unusual bruising or bleeding Side  effects that usually do not require medical attention (report to your care team if they continue or are bothersome): Constipation Diarrhea Hair loss Loss of appetite Nausea Stomach pain This list may not describe all possible side effects. Call your doctor for medical advice about side effects. You may report side effects to FDA at 1-800-FDA-1088. Where should I keep my medication? This medication is given in a hospital or clinic. It will not be stored at home. NOTE: This sheet is a summary. It may not cover all possible information. If you have questions about this medicine, talk to your doctor, pharmacist, or health care provider.  2023 Elsevier/Gold Standard (2022-02-26 00:00:00)  Leucovorin Injection What is this medication? LEUCOVORIN (loo koe VOR in) prevents side effects from certain medications, such as methotrexate. It works by increasing folate levels. This helps protect healthy cells in your body. It may also be used to treat anemia caused by low levels of folate. It can also be used with fluorouracil, a type of chemotherapy, to treat colorectal cancer. It works by increasing the  effects of fluorouracil in the body. This medicine may be used for other purposes; ask your health care provider or pharmacist if you have questions. What should I tell my care team before I take this medication? They need to know if you have any of these conditions: Anemia from low levels of vitamin B12 in the blood An unusual or allergic reaction to leucovorin, folic acid, other medications, foods, dyes, or preservatives Pregnant or trying to get pregnant Breastfeeding How should I use this medication? This medication is injected into a vein or a muscle. It is given by your care team in a hospital or clinic setting. Talk to your care team about the use of this medication in children. Special care may be needed. Overdosage: If you think you have taken too much of this medicine contact a poison control  center or emergency room at once. NOTE: This medicine is only for you. Do not share this medicine with others. What if I miss a dose? Keep appointments for follow-up doses. It is important not to miss your dose. Call your care team if you are unable to keep an appointment. What may interact with this medication? Capecitabine Fluorouracil Phenobarbital Phenytoin Primidone Trimethoprim;sulfamethoxazole This list may not describe all possible interactions. Give your health care provider a list of all the medicines, herbs, non-prescription drugs, or dietary supplements you use. Also tell them if you smoke, drink alcohol, or use illegal drugs. Some items may interact with your medicine. What should I watch for while using this medication? Your condition will be monitored carefully while you are receiving this medication. This medication may increase the side effects of 5-fluorouracil. Tell your care team if you have diarrhea or mouth sores that do not get better or that get worse. What side effects may I notice from receiving this medication? Side effects that you should report to your care team as soon as possible: Allergic reactions--skin rash, itching, hives, swelling of the face, lips, tongue, or throat This list may not describe all possible side effects. Call your doctor for medical advice about side effects. You may report side effects to FDA at 1-800-FDA-1088. Where should I keep my medication? This medication is given in a hospital or clinic. It will not be stored at home. NOTE: This sheet is a summary. It may not cover all possible information. If you have questions about this medicine, talk to your doctor, pharmacist, or health care provider.  2023 Elsevier/Gold Standard (2022-03-24 00:00:00)  Fluorouracil Injection What is this medication? FLUOROURACIL (flure oh YOOR a sil) treats some types of cancer. It works by slowing down the growth of cancer cells. This medicine may be used  for other purposes; ask your health care provider or pharmacist if you have questions. COMMON BRAND NAME(S): Adrucil What should I tell my care team before I take this medication? They need to know if you have any of these conditions: Blood disorders Dihydropyrimidine dehydrogenase (DPD) deficiency Infection, such as chickenpox, cold sores, herpes Kidney disease Liver disease Poor nutrition Recent or ongoing radiation therapy An unusual or allergic reaction to fluorouracil, other medications, foods, dyes, or preservatives If you or your partner are pregnant or trying to get pregnant Breast-feeding How should I use this medication? This medication is injected into a vein. It is administered by your care team in a hospital or clinic setting. Talk to your care team about the use of this medication in children. Special care may be needed. Overdosage: If you think you have taken  too much of this medicine contact a poison control center or emergency room at once. NOTE: This medicine is only for you. Do not share this medicine with others. What if I miss a dose? Keep appointments for follow-up doses. It is important not to miss your dose. Call your care team if you are unable to keep an appointment. What may interact with this medication? Do not take this medication with any of the following: Live virus vaccines This medication may also interact with the following: Medications that treat or prevent blood clots, such as warfarin, enoxaparin, dalteparin This list may not describe all possible interactions. Give your health care provider a list of all the medicines, herbs, non-prescription drugs, or dietary supplements you use. Also tell them if you smoke, drink alcohol, or use illegal drugs. Some items may interact with your medicine. What should I watch for while using this medication? Your condition will be monitored carefully while you are receiving this medication. This medication may make  you feel generally unwell. This is not uncommon as chemotherapy can affect healthy cells as well as cancer cells. Report any side effects. Continue your course of treatment even though you feel ill unless your care team tells you to stop. In some cases, you may be given additional medications to help with side effects. Follow all directions for their use. This medication may increase your risk of getting an infection. Call your care team for advice if you get a fever, chills, sore throat, or other symptoms of a cold or flu. Do not treat yourself. Try to avoid being around people who are sick. This medication may increase your risk to bruise or bleed. Call your care team if you notice any unusual bleeding. Be careful brushing or flossing your teeth or using a toothpick because you may get an infection or bleed more easily. If you have any dental work done, tell your dentist you are receiving this medication. Avoid taking medications that contain aspirin, acetaminophen, ibuprofen, naproxen, or ketoprofen unless instructed by your care team. These medications may hide a fever. Do not treat diarrhea with over the counter products. Contact your care team if you have diarrhea that lasts more than 2 days or if it is severe and watery. This medication can make you more sensitive to the sun. Keep out of the sun. If you cannot avoid being in the sun, wear protective clothing and sunscreen. Do not use sun lamps, tanning beds, or tanning booths. Talk to your care team if you or your partner wish to become pregnant or think you might be pregnant. This medication can cause serious birth defects if taken during pregnancy and for 3 months after the last dose. A reliable form of contraception is recommended while taking this medication and for 3 months after the last dose. Talk to your care team about effective forms of contraception. Do not father a child while taking this medication and for 3 months after the last dose.  Use a condom while having sex during this time period. Do not breastfeed while taking this medication. This medication may cause infertility. Talk to your care team if you are concerned about your fertility. What side effects may I notice from receiving this medication? Side effects that you should report to your care team as soon as possible: Allergic reactions--skin rash, itching, hives, swelling of the face, lips, tongue, or throat Heart attack--pain or tightness in the chest, shoulders, arms, or jaw, nausea, shortness of breath, cold or clammy skin,  feeling faint or lightheaded Heart failure--shortness of breath, swelling of the ankles, feet, or hands, sudden weight gain, unusual weakness or fatigue Heart rhythm changes--fast or irregular heartbeat, dizziness, feeling faint or lightheaded, chest pain, trouble breathing High ammonia level--unusual weakness or fatigue, confusion, loss of appetite, nausea, vomiting, seizures Infection--fever, chills, cough, sore throat, wounds that don't heal, pain or trouble when passing urine, general feeling of discomfort or being unwell Low red blood cell level--unusual weakness or fatigue, dizziness, headache, trouble breathing Pain, tingling, or numbness in the hands or feet, muscle weakness, change in vision, confusion or trouble speaking, loss of balance or coordination, trouble walking, seizures Redness, swelling, and blistering of the skin over hands and feet Severe or prolonged diarrhea Unusual bruising or bleeding Side effects that usually do not require medical attention (report to your care team if they continue or are bothersome): Dry skin Headache Increased tears Nausea Pain, redness, or swelling with sores inside the mouth or throat Sensitivity to light Vomiting This list may not describe all possible side effects. Call your doctor for medical advice about side effects. You may report side effects to FDA at 1-800-FDA-1088. Where should I  keep my medication? This medication is given in a hospital or clinic. It will not be stored at home. NOTE: This sheet is a summary. It may not cover all possible information. If you have questions about this medicine, talk to your doctor, pharmacist, or health care provider.  2023 Elsevier/Gold Standard (2022-02-17 00:00:00)

## 2023-01-25 NOTE — Progress Notes (Signed)
[name] OFFICE PROGRESS NOTE   Diagnosis: Colon cancer  INTERVAL HISTORY:   Aaron Roth completed another cycle of FOLFIRI on 01/11/2023.  No nausea/vomiting or mouth sores.  Mild diarrhea.  He is not taking iron consistently.  He feels the abdominal mass is smaller.  Objective:  Vital signs in last 24 hours:  Blood pressure (!) 164/81, pulse 76, temperature 98.1 F (36.7 C), temperature source Oral, resp. rate 18, height 5\' 7"  (1.702 m), weight 159 lb 3.2 oz (72.2 kg), SpO2 100 %.    HEENT: No thrush or ulcers Resp: Lungs clear bilaterally Cardio: Regular rate and rhythm GI: No hepatosplenomegaly, mid abdominal mass is mobile and subjectively appears smaller Vascular: Trace edema at the left ankle    Portacath/PICC-without erythema  Lab Results:  Lab Results  Component Value Date   WBC 9.4 01/25/2023   HGB 7.9 (L) 01/25/2023   HCT 26.3 (L) 01/25/2023   MCV 71.9 (L) 01/25/2023   PLT 450 (H) 01/25/2023   NEUTROABS 7.0 01/25/2023    CMP  Lab Results  Component Value Date   NA 139 01/25/2023   K 3.4 (L) 01/25/2023   CL 103 01/25/2023   CO2 31 01/25/2023   GLUCOSE 88 01/25/2023   BUN 9 01/25/2023   CREATININE 0.74 01/25/2023   CALCIUM 8.5 (L) 01/25/2023   PROT 6.0 (L) 01/25/2023   ALBUMIN 3.1 (L) 01/25/2023   AST 9 (L) 01/25/2023   ALT 5 01/25/2023   ALKPHOS 107 01/25/2023   BILITOT 0.3 01/25/2023   GFRNONAA >60 01/25/2023   GFRAA 80 10/09/2020    Lab Results  Component Value Date   CEA1 2.0 08/04/2020   CEA 5.12 (H) 12/07/2022    Medications: I have reviewed the patient's current medications.   Assessment/Plan: Colon cancer  CT abdomen/pelvis 08/02/2020-small amount of free fluid in the pelvis, postsurgical change involving the left colon, trace right pleural fluid.   Colonoscopy 08/04/2020-cecal mass as well as a 3 mm polyp in the ascending colon, 9 mm polyp in the ascending colon, 3 mm polyp in the transverse colon and 4 small  polyps in the transverse colon.  Biopsy of the cecal mass showed adenocarcinoma.  Pathology on the polyps showed tubular adenomas with no high-grade dysplasia or malignancy identified. CT chest 08/04/2020-no evidence of metastatic disease CEA 08/04/2020-2.0 Laparoscopic right colectomy 10/17/2020 by Dr. Dorothyann Gibbs showed adenocarcinoma with mucinous features, moderately differentiated, 6.7 cm; no carcinoma identified and 23 lymph nodes; margins uninvolved; no macroscopic tumor perforation identified; tumor invaded through muscularis propria into pericolorectal tissue; no lymphovascular invasion and no perineural invasion, pT3, pN0, mismatch repair protein IHC normal, MSI stable.   06/18/2022-CT abdomen/pelvis-homogenous soft tissue mass in the anterior abdomen deep to the umbilicus, 5 mm left lower lobe nodule, new from October 2021 11/04/2022 biopsy of abdominal mass-metastatic mucinous adenocarcinoma, mismatch repair protein IHC normal, foundation 1 pending CTs 11/18/2022-large mass central lower abdomen is increased in size.  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  No evidence of obstruction.  Irregular lesions right lobe of the liver likely new, compatible with metastatic disease.  Increase size of a solid left lower lobe pulmonary nodule likely due to metastatic disease.  Solid pulmonary nodule left upper lobe increased in size likely due to indolent primary lung malignancy. Foundation 1 testing (date of collection 10/17/2020, specimen received 11/24/2022)-microsatellite status is equivocal; tumor mutation burden 6; K-ras  wild-type; NRAS G12S; BRAF [id number] Cycle 1 FOLFIRI 12/07/2022 Chemotherapy held 12/21/2022 due to neutropenia Chemotherapy held 12/28/2022 per patient due to upcoming dental extractions Cycle 2 FOLFIRI 01/11/2023, irinotecan dose reduced secondary to diarrhea neutropenia, Udenyca Cycle 3  FOLFIRI 01/25/2023 Iron deficiency anemia October 2021  08/02/2020 hemoglobin 3.8, MCV 56, ferritin 3, stool positive for occult blood 10/19/2020 hemoglobin 7.3, MCV 74 11/04/2022 hemoglobin 4.5 11/05/2022 ferritin 3 Ferrous sulfate 325 mg twice daily recommended 11/06/2022 2 units packed red blood cells 11/17/2022 stool cards positive for blood 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 He completed cycle 1 CAPOX beginning 11/13/2011; cycle 7 beginning 03/23/2012. Cycle 8 was completed beginning on 04/13/2012 without oxaliplatin. Restaging CT scans 06/06/2012 without evidence of recurrent disease.   Family history-brother with colon cancer Multiple polyps on colonoscopy 08/04/2020  Port-A-Cath placement 12/04/2022, Interventional Radiology      Disposition: [name] tolerated the last cycle of FOLFIRI well.  He will complete cycle 3 today.  The abdominal mass appears slightly smaller on exam.  The plan is to complete 5 cycles of FOLFIRI prior to a restaging CT.  I encouraged him to take the iron supplement twice daily.  He will call for dyspnea.  [name] will return for an office visit in 2 weeks.  Betsy Coder, MD  01/25/2023  10:46 AM

## 2023-01-27 ENCOUNTER — Inpatient Hospital Stay: Payer: Self-pay

## 2023-01-27 VITALS — BP 192/72 | HR 58 | Temp 98.1°F | Resp 18

## 2023-01-27 DIAGNOSIS — C182 Malignant neoplasm of ascending colon: Secondary | ICD-10-CM

## 2023-01-27 MED ORDER — HEPARIN SOD (PORK) LOCK FLUSH 100 UNIT/ML IV SOLN
500.0000 [IU] | Freq: Once | INTRAVENOUS | Status: AC | PRN
  Administered 2023-01-27: 500 [IU]

## 2023-01-27 MED ORDER — PEGFILGRASTIM-CBQV 6 MG/0.6ML ~~LOC~~ SOSY
6.0000 mg | PREFILLED_SYRINGE | Freq: Once | SUBCUTANEOUS | Status: AC
  Administered 2023-01-27: 6 mg via SUBCUTANEOUS
  Filled 2023-01-27: qty 0.6

## 2023-01-27 MED ORDER — SODIUM CHLORIDE 0.9% FLUSH
10.0000 mL | INTRAVENOUS | Status: DC | PRN
  Administered 2023-01-27: 10 mL

## 2023-01-27 NOTE — Patient Instructions (Signed)
Implanted Port Home Guide An implanted port is a device that is placed under the skin. It is usually placed in the chest. The device may vary based on the need. Implanted ports can be used to give IV medicine, to take blood, or to give fluids. You may have an implanted port if: You need IV medicine that would be irritating to the small veins in your hands or arms. You need IV medicines, such as chemotherapy, for a long period of time. You need IV nutrition for a long period of time. You may have fewer limitations when using a port than you would if you used other types of long-term IVs. You will also likely be able to return to normal activities after your incision heals. An implanted port has two main parts: Reservoir. The reservoir is the part where a needle is inserted to give medicines or draw blood. The reservoir is round. After the port is placed, it appears as a small, raised area under your skin. Catheter. The catheter is a small, thin tube that connects the reservoir to a vein. Medicine that is inserted into the reservoir goes into the catheter and then into the vein. How is my port accessed? To access your port: A numbing cream may be placed on the skin over the port site. Your health care provider will put on a mask and sterile gloves. The skin over your port will be cleaned carefully with a germ-killing soap and allowed to dry. Your health care provider will gently pinch the port and insert a needle into it. Your health care provider will check for a blood return to make sure the port is in the vein and is still working (patent). If your port needs to remain accessed to get medicine continuously (constant infusion), your health care provider will place a clear bandage (dressing) over the needle site. The dressing and needle will need to be changed every week, or as told by your health care provider. What is flushing? Flushing helps keep the port working. Follow instructions from your  health care provider about how and when to flush the port. Ports are usually flushed with saline solution or a medicine called heparin. The need for flushing will depend on how the port is used: If the port is only used from time to time to give medicines or draw blood, the port may need to be flushed: Before and after medicines have been given. Before and after blood has been drawn. As part of routine maintenance. Flushing may be recommended every 4-6 weeks. If a constant infusion is running, the port may not need to be flushed. Throw away any syringes in a disposal container that is meant for sharp items (sharps container). You can buy a sharps container from a pharmacy, or you can make one by using an empty hard plastic bottle with a cover. How long will my port stay implanted? The port can stay in for as long as your health care provider thinks it is needed. When it is time for the port to come out, a surgery will be done to remove it. The surgery will be similar to the procedure that was done to put the port in. Follow these instructions at home: Caring for your port and port site Flush your port as told by your health care provider. If you need an infusion over several days, follow instructions from your health care provider about how to take care of your port site. Make sure you: Change your   dressing as told by your health care provider. Wash your hands with soap and water for at least 20 seconds before and after you change your dressing. If soap and water are not available, use alcohol-based hand sanitizer. Place any used dressings or infusion bags into a plastic bag. Throw that bag in the trash. Keep the dressing that covers the needle clean and dry. Do not get it wet. Do not use scissors or sharp objects near the infusion tubing. Keep any external tubes clamped, unless they are being used. Check your port site every day for signs of infection. Check for: Redness, swelling, or  pain. Fluid or blood. Warmth. Pus or a bad smell. Protect the skin around the port site. Avoid wearing bra straps that rub or irritate the site. Protect the skin around your port from seat belts. Place a soft pad over your chest if needed. Bathe or shower as told by your health care provider. The site may get wet as long as you are not actively receiving an infusion. General instructions  Return to your normal activities as told by your health care provider. Ask your health care provider what activities are safe for you. Carry a medical alert card or wear a medical alert bracelet at all times. This will let health care providers know that you have an implanted port in case of an emergency. Where to find more information American Cancer Society: www.cancer.org American Society of Clinical Oncology: www.cancer.net Contact a health care provider if: You have a fever or chills. You have redness, swelling, or pain at the port site. You have fluid or blood coming from your port site. Your incision feels warm to the touch. You have pus or a bad smell coming from the port site. Summary Implanted ports are usually placed in the chest for long-term IV access. Follow instructions from your health care provider about flushing the port and changing bandages (dressings). Take care of the area around your port by avoiding clothing that puts pressure on the area, and by watching for signs of infection. Protect the skin around your port from seat belts. Place a soft pad over your chest if needed. Contact a health care provider if you have a fever or you have redness, swelling, pain, fluid, or a bad smell at the port site. This information is not intended to replace advice given to you by your health care provider. Make sure you discuss any questions you have with your health care provider. Document Revised: 04/22/2021 Document Reviewed: 04/22/2021 Elsevier Patient Education  2023 Elsevier  Inc.  Pegfilgrastim Injection What is this medication? PEGFILGRASTIM (PEG fil gra stim) lowers the risk of infection in people who are receiving chemotherapy. It works by helping your body make more white blood cells, which protects your body from infection. It may also be used to help people who have been exposed to high doses of radiation. This medicine may be used for other purposes; ask your health care provider or pharmacist if you have questions. COMMON BRAND NAME(S): Fulphila, Fylnetra, Neulasta, Nyvepria, Stimufend, UDENYCA, Ziextenzo What should I tell my care team before I take this medication? They need to know if you have any of these conditions: Kidney disease Latex allergy Ongoing radiation therapy Sickle cell disease Skin reactions to acrylic adhesives (On-Body Injector only) An unusual or allergic reaction to pegfilgrastim, filgrastim, other medications, foods, dyes, or preservatives Pregnant or trying to get pregnant Breast-feeding How should I use this medication? This medication is for injection under the   skin. If you get this medication at home, you will be taught how to prepare and give the pre-filled syringe or how to use the On-body Injector. Refer to the patient Instructions for Use for detailed instructions. Use exactly as directed. Tell your care team immediately if you suspect that the On-body Injector may not have performed as intended or if you suspect the use of the On-body Injector resulted in a missed or partial dose. It is important that you put your used needles and syringes in a special sharps container. Do not put them in a trash can. If you do not have a sharps container, call your pharmacist or care team to get one. Talk to your care team about the use of this medication in children. While this medication may be prescribed for selected conditions, precautions do apply. Overdosage: If you think you have taken too much of this medicine contact a poison control  center or emergency room at once. NOTE: This medicine is only for you. Do not share this medicine with others. What if I miss a dose? It is important not to miss your dose. Call your care team if you miss your dose. If you miss a dose due to an On-body Injector failure or leakage, a new dose should be administered as soon as possible using a single prefilled syringe for manual use. What may interact with this medication? Interactions have not been studied. This list may not describe all possible interactions. Give your health care provider a list of all the medicines, herbs, non-prescription drugs, or dietary supplements you use. Also tell them if you smoke, drink alcohol, or use illegal drugs. Some items may interact with your medicine. What should I watch for while using this medication? Your condition will be monitored carefully while you are receiving this medication. You may need blood work done while you are taking this medication. Talk to your care team about your risk of cancer. You may be more at risk for certain types of cancer if you take this medication. If you are going to need a MRI, CT scan, or other procedure, tell your care team that you are using this medication (On-Body Injector only). What side effects may I notice from receiving this medication? Side effects that you should report to your care team as soon as possible: Allergic reactions--skin rash, itching, hives, swelling of the face, lips, tongue, or throat Capillary leak syndrome--stomach or muscle pain, unusual weakness or fatigue, feeling faint or lightheaded, decrease in the amount of urine, swelling of the ankles, hands, or feet, trouble breathing High white blood cell level--fever, fatigue, trouble breathing, night sweats, change in vision, weight loss Inflammation of the aorta--fever, fatigue, back, chest, or stomach pain, severe headache Kidney injury (glomerulonephritis)--decrease in the amount of urine, red or dark  brown urine, foamy or bubbly urine, swelling of the ankles, hands, or feet Shortness of breath or trouble breathing Spleen injury--pain in upper left stomach or shoulder Unusual bruising or bleeding Side effects that usually do not require medical attention (report to your care team if they continue or are bothersome): Bone pain Pain in the hands or feet This list may not describe all possible side effects. Call your doctor for medical advice about side effects. You may report side effects to FDA at 1-800-FDA-1088. Where should I keep my medication? Keep out of the reach of children. If you are using this medication at home, you will be instructed on how to store it. Throw away any unused   medication after the expiration date on the label. NOTE: This sheet is a summary. It may not cover all possible information. If you have questions about this medicine, talk to your doctor, pharmacist, or health care provider.  2023 Elsevier/Gold Standard (2021-07-04 00:00:00)  

## 2023-02-01 ENCOUNTER — Telehealth: Payer: Self-pay | Admitting: Pharmacist

## 2023-02-01 NOTE — Progress Notes (Signed)
Patient attempted to be outreached by Darrall Dears, PharmD Candidate on 4/1 to discuss hypertension. Left voicemail for patient to return our call at their convenience at [phone].   Darrall Dears, PharmD Candidate   Catie Hedwig Morton, PharmD, [place], [facility] [phone]

## 2023-02-02 ENCOUNTER — Other Ambulatory Visit: Payer: Self-pay

## 2023-02-07 ENCOUNTER — Other Ambulatory Visit: Payer: Self-pay | Admitting: Oncology

## 2023-02-08 ENCOUNTER — Encounter: Payer: Self-pay | Admitting: *Deleted

## 2023-02-08 ENCOUNTER — Inpatient Hospital Stay (HOSPITAL_BASED_OUTPATIENT_CLINIC_OR_DEPARTMENT_OTHER): Payer: Self-pay | Admitting: Oncology

## 2023-02-08 ENCOUNTER — Inpatient Hospital Stay: Payer: Self-pay

## 2023-02-08 ENCOUNTER — Inpatient Hospital Stay: Payer: Self-pay | Attending: Oncology

## 2023-02-08 VITALS — BP 181/79 | HR 68

## 2023-02-08 VITALS — BP 182/90 | HR 78 | Temp 98.2°F | Resp 18 | Ht 67.0 in | Wt 158.2 lb

## 2023-02-08 DIAGNOSIS — D509 Iron deficiency anemia, unspecified: Secondary | ICD-10-CM | POA: Insufficient documentation

## 2023-02-08 DIAGNOSIS — Z5111 Encounter for antineoplastic chemotherapy: Secondary | ICD-10-CM | POA: Insufficient documentation

## 2023-02-08 DIAGNOSIS — C182 Malignant neoplasm of ascending colon: Secondary | ICD-10-CM

## 2023-02-08 DIAGNOSIS — Z933 Colostomy status: Secondary | ICD-10-CM | POA: Insufficient documentation

## 2023-02-08 DIAGNOSIS — Z79899 Other long term (current) drug therapy: Secondary | ICD-10-CM | POA: Insufficient documentation

## 2023-02-08 DIAGNOSIS — R197 Diarrhea, unspecified: Secondary | ICD-10-CM | POA: Insufficient documentation

## 2023-02-08 DIAGNOSIS — C180 Malignant neoplasm of cecum: Secondary | ICD-10-CM | POA: Insufficient documentation

## 2023-02-08 DIAGNOSIS — C7989 Secondary malignant neoplasm of other specified sites: Secondary | ICD-10-CM | POA: Insufficient documentation

## 2023-02-08 DIAGNOSIS — Z5189 Encounter for other specified aftercare: Secondary | ICD-10-CM | POA: Insufficient documentation

## 2023-02-08 LAB — CBC WITH DIFFERENTIAL (CANCER CENTER ONLY)
Abs Immature Granulocytes: 0.16 10*3/uL — ABNORMAL HIGH (ref 0.00–0.07)
Basophils Absolute: 0.1 10*3/uL (ref 0.0–0.1)
Basophils Relative: 1 %
Eosinophils Absolute: 0.4 10*3/uL (ref 0.0–0.5)
Eosinophils Relative: 3 %
HCT: 25.1 % — ABNORMAL LOW (ref 39.0–52.0)
Hemoglobin: 7.6 g/dL — ABNORMAL LOW (ref 13.0–17.0)
Immature Granulocytes: 1 %
Lymphocytes Relative: 13 %
Lymphs Abs: 1.5 10*3/uL (ref 0.7–4.0)
MCH: 21.4 pg — ABNORMAL LOW (ref 26.0–34.0)
MCHC: 30.3 g/dL (ref 30.0–36.0)
MCV: 70.7 fL — ABNORMAL LOW (ref 80.0–100.0)
Monocytes Absolute: 0.7 10*3/uL (ref 0.1–1.0)
Monocytes Relative: 6 %
Neutro Abs: 8.7 10*3/uL — ABNORMAL HIGH (ref 1.7–7.7)
Neutrophils Relative %: 76 %
Platelet Count: 478 10*3/uL — ABNORMAL HIGH (ref 150–400)
RBC: 3.55 MIL/uL — ABNORMAL LOW (ref 4.22–5.81)
RDW: 25.9 % — ABNORMAL HIGH (ref 11.5–15.5)
WBC Count: 11.6 10*3/uL — ABNORMAL HIGH (ref 4.0–10.5)
nRBC: 0 % (ref 0.0–0.2)

## 2023-02-08 LAB — CEA (ACCESS): CEA (CHCC): 8.49 ng/mL — ABNORMAL HIGH (ref 0.00–5.00)

## 2023-02-08 LAB — CMP (CANCER CENTER ONLY)
ALT: <5 U/L (ref 0–44)
AST: 9 U/L — ABNORMAL LOW (ref 15–41)
Albumin: 2.9 g/dL — ABNORMAL LOW (ref 3.5–5.0)
Alkaline Phosphatase: 116 U/L (ref 38–126)
Anion gap: 9 (ref 5–15)
BUN: 10 mg/dL (ref 8–23)
CO2: 29 mmol/L (ref 22–32)
Calcium: 8 mg/dL — ABNORMAL LOW (ref 8.9–10.3)
Chloride: 102 mmol/L (ref 98–111)
Creatinine: 0.77 mg/dL (ref 0.61–1.24)
GFR, Estimated: >60 mL/min (ref >60)
Glucose, Bld: 107 mg/dL — ABNORMAL HIGH (ref 70–99)
Potassium: 2.9 mmol/L — ABNORMAL LOW (ref 3.5–5.1)
Sodium: 140 mmol/L (ref 135–145)
Total Bilirubin: 0.3 mg/dL (ref 0.3–1.2)
Total Protein: 5.6 g/dL — ABNORMAL LOW (ref 6.5–8.1)

## 2023-02-08 LAB — MAGNESIUM: Magnesium: 1.8 mg/dL (ref 1.7–2.4)

## 2023-02-08 MED ORDER — SODIUM CHLORIDE 0.9 % IV SOLN
135.0000 mg/m2 | Freq: Once | INTRAVENOUS | Status: AC
  Administered 2023-02-08: 240 mg via INTRAVENOUS
  Filled 2023-02-08: qty 10

## 2023-02-08 MED ORDER — SODIUM CHLORIDE 0.9 % IV SOLN
2000.0000 mg/m2 | INTRAVENOUS | Status: DC
  Administered 2023-02-08: 3500 mg via INTRAVENOUS
  Filled 2023-02-08: qty 70

## 2023-02-08 MED ORDER — ATROPINE SULFATE 1 MG/ML IV SOLN
0.5000 mg | Freq: Once | INTRAVENOUS | Status: AC | PRN
  Administered 2023-02-08: 0.5 mg via INTRAVENOUS
  Filled 2023-02-08: qty 1

## 2023-02-08 MED ORDER — SODIUM CHLORIDE 0.9 % IV SOLN: Freq: Once | INTRAVENOUS | Status: AC

## 2023-02-08 MED ORDER — SODIUM CHLORIDE 0.9 % IV SOLN
10.0000 mg | Freq: Once | INTRAVENOUS | Status: AC
  Administered 2023-02-08: 10 mg via INTRAVENOUS
  Filled 2023-02-08: qty 10

## 2023-02-08 MED ORDER — SODIUM CHLORIDE 0.9 % IV SOLN
400.0000 mg/m2 | Freq: Once | INTRAVENOUS | Status: AC
  Administered 2023-02-08: 728 mg via INTRAVENOUS
  Filled 2023-02-08: qty 36.4

## 2023-02-08 MED ORDER — FLUOROURACIL CHEMO INJECTION 2.5 GM/50ML
400.0000 mg/m2 | Freq: Once | INTRAVENOUS | Status: AC
  Administered 2023-02-08: 750 mg via INTRAVENOUS
  Filled 2023-02-08: qty 15

## 2023-02-08 MED ORDER — PALONOSETRON HCL INJECTION 0.25 MG/5ML
0.2500 mg | Freq: Once | INTRAVENOUS | Status: AC
  Administered 2023-02-08: 0.25 mg via INTRAVENOUS
  Filled 2023-02-08: qty 5

## 2023-02-08 NOTE — Progress Notes (Signed)
Patient declines potassium infusion. Patient advised per Dr Truett Perna to take his potassium twice daily x 4 days, then back to daily. Patient expresses understanding.

## 2023-02-08 NOTE — Progress Notes (Signed)
[facility] OFFICE PROGRESS NOTE   Diagnosis: Colon cancer  INTERVAL HISTORY:   Blattner completed on cycle FOLFIRI on 01/25/2023.  No nausea/vomiting or mouth sores.  He reports intermittent diarrhea.  He is not taking iron or potassium as prescribed. Good appetite.  He has been active working at home.  He has not taken medications today. Objective:  Vital signs in last 24 hours:  Blood pressure (!) 182/90, pulse 78, temperature 98.2 F (36.8 C), resp. rate 18, height 5\' 7"  (1.702 m), weight 158 lb 3.2 oz (71.8 kg), SpO2 100 %.    HEENT: No thrush or ulcers Resp: Lungs clear bilaterally Cardio: Regular rate and rhythm GI: Mid abdominal mass is mobile, no hepatosplenomegaly Vascular: Left greater than right lower leg edema    Portacath/PICC-without erythema  Lab Results:  Lab Results  Component Value Date   WBC 11.6 (H) 02/08/2023   HGB 7.6 (L) 02/08/2023   HCT 25.1 (L) 02/08/2023   MCV 70.7 (L) 02/08/2023   PLT 478 (H) 02/08/2023   NEUTROABS 8.7 (H) 02/08/2023    CMP  Lab Results  Component Value Date   NA 140 02/08/2023   K 2.9 (L) 02/08/2023   CL 102 02/08/2023   CO2 29 02/08/2023   GLUCOSE 107 (H) 02/08/2023   BUN 10 02/08/2023   CREATININE 0.77 02/08/2023   CALCIUM 8.0 (L) 02/08/2023   PROT 5.6 (L) 02/08/2023   ALBUMIN 2.9 (L) 02/08/2023   AST 9 (L) 02/08/2023   ALT <5 02/08/2023   ALKPHOS 116 02/08/2023   BILITOT 0.3 02/08/2023   GFRNONAA >60 02/08/2023   GFRAA 80 10/09/2020    Lab Results  Component Value Date   CEA1 2.0 08/04/2020   CEA 8.49 (H) 02/08/2023    Medications: I have reviewed the patient's current medications.   Assessment/Plan: Colon cancer  CT abdomen/pelvis 08/02/2020-small amount of free fluid in the pelvis, postsurgical change involving the left colon, trace right pleural fluid.   Colonoscopy 08/04/2020-cecal mass as well as a 3 mm polyp in the ascending colon, 9 mm polyp in the ascending colon, 3 mm polyp in  the transverse colon and 4 small polyps in the transverse colon.  Biopsy of the cecal mass showed adenocarcinoma.  Pathology on the polyps showed tubular adenomas with no high-grade dysplasia or malignancy identified. CT chest 08/04/2020-no evidence of metastatic disease CEA 08/04/2020-2.0 Laparoscopic right colectomy 10/17/2020 by Dr. Nigel Mormon showed adenocarcinoma with mucinous features, moderately differentiated, 6.7 cm; no carcinoma identified and 23 lymph nodes; margins uninvolved; no macroscopic tumor perforation identified; tumor invaded through muscularis propria into pericolorectal tissue; no lymphovascular invasion and no perineural invasion, pT3, pN0, mismatch repair protein IHC normal, MSI stable.   06/18/2022-CT abdomen/pelvis-homogenous soft tissue mass in the anterior abdomen deep to the umbilicus, 5 mm left lower lobe nodule, new from October 2021 11/04/2022 biopsy of abdominal mass-metastatic mucinous adenocarcinoma, mismatch repair protein IHC normal, foundation 1 pending CTs 11/18/2022-large mass central lower abdomen is increased in size.  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  No evidence of obstruction.  Irregular lesions right lobe of the liver likely new, compatible with metastatic disease.  Increase size of a solid left lower lobe pulmonary nodule likely due to metastatic disease.  Solid pulmonary nodule left upper lobe increased in size likely due to indolent primary lung malignancy. Foundation 1 testing (date of collection 10/17/2020, specimen received 11/24/2022)-microsatellite  status is equivocal; tumor mutation burden 6; K-ras wild-type; NRAS G12S; BRAF [id number] Cycle 1 FOLFIRI 12/07/2022 Chemotherapy held 12/21/2022 due to neutropenia Chemotherapy held 12/28/2022 per patient due to upcoming dental extractions Cycle 2 FOLFIRI 01/11/2023, irinotecan dose reduced secondary to  diarrhea neutropenia, Udenyca Cycle 3 FOLFIRI 01/25/2023 Cycle 4 FOLFIRI 02/08/2023 Iron deficiency anemia October 2021  08/02/2020 hemoglobin 3.8, MCV 56, ferritin 3, stool positive for occult blood 10/19/2020 hemoglobin 7.3, MCV 74 11/04/2022 hemoglobin 4.5 11/05/2022 ferritin 3 Ferrous sulfate 325 mg twice daily recommended 11/06/2022 2 units packed red blood cells 11/17/2022 stool cards positive for blood 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 He completed cycle 1 CAPOX beginning 11/13/2011; cycle 7 beginning 03/23/2012. Cycle 8 was completed beginning on 04/13/2012 without oxaliplatin. Restaging CT scans 06/06/2012 without evidence of recurrent disease.   Family history-brother with colon cancer Multiple polyps on colonoscopy 08/04/2020  Port-A-Cath placement 12/04/2022, Interventional Radiology       Disposition: Aaron Roth appears stable.  He is tolerating the FOLFIRI well.  He will complete cycle 4 today.  The abdominal mass appears slightly smaller and more mobile.  He will return for an office visit and chemotherapy in 2 weeks.  He will be referred for a restaging CT after cycle 5.  I recommended he take ferrous sulfate 3 times daily.  We will check the magnesium level and replete the potassium and magnesium as indicated.  Thornton Papas, MD  02/08/2023  10:57 AM

## 2023-02-08 NOTE — Progress Notes (Signed)
Patient seen by Dr. Truett Perna today  Vitals are within treatment parameters.No intervention needed for BP 182/90 (he did not take med today)  Labs reviewed by Dr. Truett Perna and are not all within treatment parameters. K+ 2.9--checking Mg+ today and will offer Mg+/K+ on pump d/c day if he agrees. OK to proceed with Hgb 7.6-has been instructed to increase ferrous sulfate to tid w/meals  Per physician team, patient is ready for treatment and there are NO modifications to the treatment plan.

## 2023-02-08 NOTE — Patient Instructions (Addendum)
If declines IV K+ on Wednesday, he needs to take his potassium twice daily x 4 days, then back to daily (per Dr. Truett Perna)   [facility] AT [facility]  The chemotherapy medication bag should finish at 46 hours, 96 hours, or 7 days. For example, if your pump is scheduled for 46 hours and it was put on at 4:00 p.m., it should finish at 2:00 p.m. the day it is scheduled to come off regardless of your appointment time.     Estimated time to finish at 12:30 Wednesday, February 10, 2023.   If the display on your pump reads "Low Volume" and it is beeping, take the batteries out of the pump and come to the cancer center for it to be taken off.   If the pump alarms go off prior to the pump reading "Low Volume" then call [phone] and someone can assist you.  If the plunger comes out and the chemotherapy medication is leaking out, please use your home chemo spill kit to clean up the spill. Do NOT use paper towels or other household products.  If you have problems or questions regarding your pump, please call either [phone] (24 hours a day) or the cancer center Monday-Friday 8:00 a.m.- 4:30 p.m. at the clinic number and we will assist you. If you are unable to get assistance, then go to the nearest Emergency Department and ask the staff to contact the IV team for assistance.   Discharge Instructions: Thank you for choosing [facility] to provide your oncology and hematology care.   If you have a lab appointment with the Cancer Center, please go directly to the Cancer Center and check in at the registration area.   Wear comfortable clothing and clothing appropriate for easy access to any Portacath or PICC line.   We strive to give you quality time with your provider. You may need to reschedule your appointment if you arrive late (15 or more minutes).  Arriving late affects you and other patients whose appointments are after yours.  Also, if you miss three or more  appointments without notifying the office, you may be dismissed from the clinic at the provider's discretion.      For prescription refill requests, have your pharmacy contact our office and allow 72 hours for refills to be completed.    Today you received the following chemotherapy and/or immunotherapy agents Irinotecan, Leucovorin, Fluorouracil.      To help prevent nausea and vomiting after your treatment, we encourage you to take your nausea medication as directed.  BELOW ARE SYMPTOMS THAT SHOULD BE REPORTED IMMEDIATELY: *FEVER GREATER THAN 100.4 F (38 C) OR HIGHER *CHILLS OR SWEATING *NAUSEA AND VOMITING THAT IS NOT CONTROLLED WITH YOUR NAUSEA MEDICATION *UNUSUAL SHORTNESS OF BREATH *UNUSUAL BRUISING OR BLEEDING *URINARY PROBLEMS (pain or burning when urinating, or frequent urination) *BOWEL PROBLEMS (unusual diarrhea, constipation, pain near the anus) TENDERNESS IN MOUTH AND THROAT WITH OR WITHOUT PRESENCE OF ULCERS (sore throat, sores in mouth, or a toothache) UNUSUAL RASH, SWELLING OR PAIN  UNUSUAL VAGINAL DISCHARGE OR ITCHING   Items with * indicate a potential emergency and should be followed up as soon as possible or go to the Emergency Department if any problems should occur.  Please show the CHEMOTHERAPY ALERT CARD or IMMUNOTHERAPY ALERT CARD at check-in to the Emergency Department and triage nurse.  Should you have questions after your visit or need to cancel or reschedule your appointment, please contact [place] [facility] AT [facility]  Dept: [phone]  and follow the prompts.  Office hours are 8:00 a.m. to 4:30 p.m. Monday - Friday. Please note that voicemails left after 4:00 p.m. may not be returned until the following business day.  We are closed weekends and major holidays. You have access to a nurse at all times for urgent questions. Please call the main number to the clinic Dept: [phone] and follow the prompts.   For any non-urgent questions,  you may also contact your provider using MyChart. We now offer e-Visits for anyone 39 and older to request care online for non-urgent symptoms. For details visit [email or web address].   Also download the MyChart app! Go to the app store, search "MyChart", open the app, select [place], and log in with your MyChart username and password.  Irinotecan Injection What is this medication? IRINOTECAN (ir in oh TEE kan) treats some types of cancer. It works by slowing down the growth of cancer cells. This medicine may be used for other purposes; ask your health care provider or pharmacist if you have questions. COMMON BRAND NAME(S): Camptosar What should I tell my care team before I take this medication? They need to know if you have any of these conditions: Dehydration Diarrhea Infection, especially a viral infection, such as chickenpox, cold sores, herpes Liver disease Low blood cell levels (white cells, red cells, and platelets) Low levels of electrolytes, such as calcium, magnesium, or potassium in your blood Recent or ongoing radiation An unusual or allergic reaction to irinotecan, other medications, foods, dyes, or preservatives If you or your partner are pregnant or trying to get pregnant Breast-feeding How should I use this medication? This medication is injected into a vein. It is given by your care team in a hospital or clinic setting. Talk to your care team about the use of this medication in children. Special care may be needed. Overdosage: If you think you have taken too much of this medicine contact a poison control center or emergency room at once. NOTE: This medicine is only for you. Do not share this medicine with others. What if I miss a dose? Keep appointments for follow-up doses. It is important not to miss your dose. Call your care team if you are unable to keep an appointment. What may interact with this medication? Do not take this medication with any of the  following: Cobicistat Itraconazole This medication may also interact with the following: Certain antibiotics, such as clarithromycin, rifampin, rifabutin Certain antivirals for HIV or AIDS Certain medications for fungal infections, such as ketoconazole, posaconazole, voriconazole Certain medications for seizures, such as carbamazepine, phenobarbital, phenytoin Gemfibrozil Nefazodone St. John's wort This list may not describe all possible interactions. Give your health care provider a list of all the medicines, herbs, non-prescription drugs, or dietary supplements you use. Also tell them if you smoke, drink alcohol, or use illegal drugs. Some items may interact with your medicine. What should I watch for while using this medication? Your condition will be monitored carefully while you are receiving this medication. You may need blood work while taking this medication. This medication may make you feel generally unwell. This is not uncommon as chemotherapy can affect healthy cells as well as cancer cells. Report any side effects. Continue your course of treatment even though you feel ill unless your care team tells you to stop. This medication can cause serious side effects. To reduce the risk, your care team may give you other medications to take before receiving  this one. Be sure to follow the directions from your care team. This medication may affect your coordination, reaction time, or judgement. Do not drive or operate machinery until you know how this medication affects you. Sit up or stand slowly to reduce the risk of dizzy or fainting spells. Drinking alcohol with this medication can increase the risk of these side effects. This medication may increase your risk of getting an infection. Call your care team for advice if you get a fever, chills, sore throat, or other symptoms of a cold or flu. Do not treat yourself. Try to avoid being around people who are sick. Avoid taking medications that  contain aspirin, acetaminophen, ibuprofen, naproxen, or ketoprofen unless instructed by your care team. These medications may hide a fever. This medication may increase your risk to bruise or bleed. Call your care team if you notice any unusual bleeding. Be careful brushing or flossing your teeth or using a toothpick because you may get an infection or bleed more easily. If you have any dental work done, tell your dentist you are receiving this medication. Talk to your care team if you or your partner are pregnant or think either of you might be pregnant. This medication can cause serious birth defects if taken during pregnancy and for 6 months after the last dose. You will need a negative pregnancy test before starting this medication. Contraception is recommended while taking this medication and for 6 months after the last dose. Your care team can help you find the option that works for you. Do not father a child while taking this medication and for 3 months after the last dose. Use a condom for contraception during this time period. Do not breastfeed while taking this medication and for 7 days after the last dose. This medication may cause infertility. Talk to your care team if you are concerned about your fertility. What side effects may I notice from receiving this medication? Side effects that you should report to your care team as soon as possible: Allergic reactions--skin rash, itching, hives, swelling of the face, lips, tongue, or throat Dry cough, shortness of breath or trouble breathing Increased saliva or tears, increased sweating, stomach cramping, diarrhea, small pupils, unusual weakness or fatigue, slow heartbeat Infection--fever, chills, cough, sore throat, wounds that don't heal, pain or trouble when passing urine, general feeling of discomfort or being unwell Kidney injury--decrease in the amount of urine, swelling of the ankles, hands, or feet Low red blood cell level--unusual  weakness or fatigue, dizziness, headache, trouble breathing Severe or prolonged diarrhea Unusual bruising or bleeding Side effects that usually do not require medical attention (report to your care team if they continue or are bothersome): Constipation Diarrhea Hair loss Loss of appetite Nausea Stomach pain This list may not describe all possible side effects. Call your doctor for medical advice about side effects. You may report side effects to FDA at 1-800-FDA-1088. Where should I keep my medication? This medication is given in a hospital or clinic. It will not be stored at home. NOTE: This sheet is a summary. It may not cover all possible information. If you have questions about this medicine, talk to your doctor, pharmacist, or health care provider.  2023 Elsevier/Gold Standard (2022-02-26 00:00:00) Leucovorin Injection What is this medication? LEUCOVORIN (loo koe VOR in) prevents side effects from certain medications, such as methotrexate. It works by increasing folate levels. This helps protect healthy cells in your body. It may also be used to treat anemia caused  by low levels of folate. It can also be used with fluorouracil, a type of chemotherapy, to treat colorectal cancer. It works by increasing the effects of fluorouracil in the body. This medicine may be used for other purposes; ask your health care provider or pharmacist if you have questions. What should I tell my care team before I take this medication? They need to know if you have any of these conditions: Anemia from low levels of vitamin B12 in the blood An unusual or allergic reaction to leucovorin, folic acid, other medications, foods, dyes, or preservatives Pregnant or trying to get pregnant Breastfeeding How should I use this medication? This medication is injected into a vein or a muscle. It is given by your care team in a hospital or clinic setting. Talk to your care team about the use of this medication in  children. Special care may be needed. Overdosage: If you think you have taken too much of this medicine contact a poison control center or emergency room at once. NOTE: This medicine is only for you. Do not share this medicine with others. What if I miss a dose? Keep appointments for follow-up doses. It is important not to miss your dose. Call your care team if you are unable to keep an appointment. What may interact with this medication? Capecitabine Fluorouracil Phenobarbital Phenytoin Primidone Trimethoprim;sulfamethoxazole This list may not describe all possible interactions. Give your health care provider a list of all the medicines, herbs, non-prescription drugs, or dietary supplements you use. Also tell them if you smoke, drink alcohol, or use illegal drugs. Some items may interact with your medicine. What should I watch for while using this medication? Your condition will be monitored carefully while you are receiving this medication. This medication may increase the side effects of 5-fluorouracil. Tell your care team if you have diarrhea or mouth sores that do not get better or that get worse. What side effects may I notice from receiving this medication? Side effects that you should report to your care team as soon as possible: Allergic reactions--skin rash, itching, hives, swelling of the face, lips, tongue, or throat This list may not describe all possible side effects. Call your doctor for medical advice about side effects. You may report side effects to FDA at 1-800-FDA-1088. Where should I keep my medication? This medication is given in a hospital or clinic. It will not be stored at home. NOTE: This sheet is a summary. It may not cover all possible information. If you have questions about this medicine, talk to your doctor, pharmacist, or health care provider.  2023 Elsevier/Gold Standard (2022-02-27 00:00:00) Fluorouracil Injection What is this medication? FLUOROURACIL  (flure oh YOOR a sil) treats some types of cancer. It works by slowing down the growth of cancer cells. This medicine may be used for other purposes; ask your health care provider or pharmacist if you have questions. COMMON BRAND NAME(S): Adrucil What should I tell my care team before I take this medication? They need to know if you have any of these conditions: Blood disorders Dihydropyrimidine dehydrogenase (DPD) deficiency Infection, such as chickenpox, cold sores, herpes Kidney disease Liver disease Poor nutrition Recent or ongoing radiation therapy An unusual or allergic reaction to fluorouracil, other medications, foods, dyes, or preservatives If you or your partner are pregnant or trying to get pregnant Breast-feeding How should I use this medication? This medication is injected into a vein. It is administered by your care team in a hospital or clinic setting. Talk  to your care team about the use of this medication in children. Special care may be needed. Overdosage: If you think you have taken too much of this medicine contact a poison control center or emergency room at once. NOTE: This medicine is only for you. Do not share this medicine with others. What if I miss a dose? Keep appointments for follow-up doses. It is important not to miss your dose. Call your care team if you are unable to keep an appointment. What may interact with this medication? Do not take this medication with any of the following: Live virus vaccines This medication may also interact with the following: Medications that treat or prevent blood clots, such as warfarin, enoxaparin, dalteparin This list may not describe all possible interactions. Give your health care provider a list of all the medicines, herbs, non-prescription drugs, or dietary supplements you use. Also tell them if you smoke, drink alcohol, or use illegal drugs. Some items may interact with your medicine. What should I watch for while using  this medication? Your condition will be monitored carefully while you are receiving this medication. This medication may make you feel generally unwell. This is not uncommon as chemotherapy can affect healthy cells as well as cancer cells. Report any side effects. Continue your course of treatment even though you feel ill unless your care team tells you to stop. In some cases, you may be given additional medications to help with side effects. Follow all directions for their use. This medication may increase your risk of getting an infection. Call your care team for advice if you get a fever, chills, sore throat, or other symptoms of a cold or flu. Do not treat yourself. Try to avoid being around people who are sick. This medication may increase your risk to bruise or bleed. Call your care team if you notice any unusual bleeding. Be careful brushing or flossing your teeth or using a toothpick because you may get an infection or bleed more easily. If you have any dental work done, tell your dentist you are receiving this medication. Avoid taking medications that contain aspirin, acetaminophen, ibuprofen, naproxen, or ketoprofen unless instructed by your care team. These medications may hide a fever. Do not treat diarrhea with over the counter products. Contact your care team if you have diarrhea that lasts more than 2 days or if it is severe and watery. This medication can make you more sensitive to the sun. Keep out of the sun. If you cannot avoid being in the sun, wear protective clothing and sunscreen. Do not use sun lamps, tanning beds, or tanning booths. Talk to your care team if you or your partner wish to become pregnant or think you might be pregnant. This medication can cause serious birth defects if taken during pregnancy and for 3 months after the last dose. A reliable form of contraception is recommended while taking this medication and for 3 months after the last dose. Talk to your care team  about effective forms of contraception. Do not father a child while taking this medication and for 3 months after the last dose. Use a condom while having sex during this time period. Do not breastfeed while taking this medication. This medication may cause infertility. Talk to your care team if you are concerned about your fertility. What side effects may I notice from receiving this medication? Side effects that you should report to your care team as soon as possible: Allergic reactions--skin rash, itching, hives, swelling of the  face, lips, tongue, or throat Heart attack--pain or tightness in the chest, shoulders, arms, or jaw, nausea, shortness of breath, cold or clammy skin, feeling faint or lightheaded Heart failure--shortness of breath, swelling of the ankles, feet, or hands, sudden weight gain, unusual weakness or fatigue Heart rhythm changes--fast or irregular heartbeat, dizziness, feeling faint or lightheaded, chest pain, trouble breathing High ammonia level--unusual weakness or fatigue, confusion, loss of appetite, nausea, vomiting, seizures Infection--fever, chills, cough, sore throat, wounds that don't heal, pain or trouble when passing urine, general feeling of discomfort or being unwell Low red blood cell level--unusual weakness or fatigue, dizziness, headache, trouble breathing Pain, tingling, or numbness in the hands or feet, muscle weakness, change in vision, confusion or trouble speaking, loss of balance or coordination, trouble walking, seizures Redness, swelling, and blistering of the skin over hands and feet Severe or prolonged diarrhea Unusual bruising or bleeding Side effects that usually do not require medical attention (report to your care team if they continue or are bothersome): Dry skin Headache Increased tears Nausea Pain, redness, or swelling with sores inside the mouth or throat Sensitivity to light Vomiting This list may not describe all possible side effects.  Call your doctor for medical advice about side effects. You may report side effects to FDA at 1-800-FDA-1088. Where should I keep my medication? This medication is given in a hospital or clinic. It will not be stored at home. NOTE: This sheet is a summary. It may not cover all possible information. If you have questions about this medicine, talk to your doctor, pharmacist, or health care provider.  2023 Elsevier/Gold Standard (2022-02-17 00:00:00)

## 2023-02-08 NOTE — Patient Instructions (Signed)
*  Increase your iron tablet to three times daily with meals  Hypokalemia--Please take your potassium consistently every day Hypokalemia means that the amount of potassium in the blood is lower than normal. Potassium is a mineral (electrolyte) that helps regulate the amount of fluid in the body. It also stimulates muscle tightening (contraction) and helps nerves work properly. Normally, most of the body's potassium is inside cells, and only a very small amount is in the blood. Because the amount in the blood is so small, minor changes to potassium levels in the blood can be life-threatening. What are the causes? This condition may be caused by: Antibiotic medicine. Diarrhea or vomiting. Taking too much of a medicine that helps you have a bowel movement (laxative) can cause diarrhea and lead to hypokalemia. Chronic kidney disease (CKD). Medicines that help the body get rid of excess fluid (diuretics). Eating disorders, such as anorexia or bulimia. Low magnesium levels in the body. Sweating a lot. What are the signs or symptoms? Symptoms of this condition include: Weakness. Constipation. Fatigue. Muscle cramps. Mental confusion. Skipped heartbeats or irregular heartbeat (palpitations). Tingling or numbness. How is this diagnosed? This condition is diagnosed with a blood test. How is this treated? This condition may be treated by: Taking potassium supplements. Adjusting the medicines that you take. Eating more foods that contain a lot of potassium. If your potassium level is very low, you may need to get potassium through an IV and be monitored in the hospital. Follow these instructions at home: Eating and drinking  Eat a healthy diet. A healthy diet includes fresh fruits and vegetables, whole grains, healthy fats, and lean proteins. If told, eat more foods that contain a lot of potassium. These include: Nuts, such as peanuts and pistachios. Seeds, such as sunflower seeds and pumpkin  seeds. Peas, lentils, and lima beans. Whole grain and bran cereals and breads. Fresh fruits and vegetables, such as apricots, avocado, bananas, cantaloupe, kiwi, oranges, tomatoes, asparagus, and potatoes. Juices, such as orange, tomato, and prune. Lean meats, including fish. Milk and milk products, such as yogurt. General instructions Take over-the-counter and prescription medicines only as told by your health care provider. This includes vitamins, natural food products, and supplements. Keep all follow-up visits. This is important. Contact a health care provider if: You have weakness that gets worse. You feel your heart pounding or racing. You vomit. You have diarrhea. You have diabetes and you have trouble keeping your blood sugar in your target range. Get help right away if: You have chest pain. You have shortness of breath. You have vomiting or diarrhea that lasts for more than 2 days. You faint. These symptoms may be an emergency. Get help right away. Call 911. Do not wait to see if the symptoms will go away. Do not drive yourself to the hospital. Summary Hypokalemia means that the amount of potassium in the blood is lower than normal. This condition is diagnosed with a blood test. Hypokalemia may be treated by taking potassium supplements, adjusting the medicines that you take, or eating more foods that are high in potassium. If your potassium level is very low, you may need to get potassium through an IV and be monitored in the hospital. This information is not intended to replace advice given to you by your health care provider. Make sure you discuss any questions you have with your health care provider. Document Revised: 07/03/2021 Document Reviewed: 07/03/2021 Elsevier Patient Education  2023 [employer].

## 2023-02-10 ENCOUNTER — Inpatient Hospital Stay: Payer: Self-pay

## 2023-02-10 VITALS — BP 130/56 | HR 64 | Temp 97.7°F | Resp 18

## 2023-02-10 DIAGNOSIS — C182 Malignant neoplasm of ascending colon: Secondary | ICD-10-CM

## 2023-02-10 MED ORDER — SODIUM CHLORIDE 0.9% FLUSH
10.0000 mL | INTRAVENOUS | Status: DC | PRN
  Administered 2023-02-10: 10 mL

## 2023-02-10 MED ORDER — HEPARIN SOD (PORK) LOCK FLUSH 100 UNIT/ML IV SOLN
500.0000 [IU] | Freq: Once | INTRAVENOUS | Status: AC | PRN
  Administered 2023-02-10: 500 [IU]

## 2023-02-10 MED ORDER — PEGFILGRASTIM-CBQV 6 MG/0.6ML ~~LOC~~ SOSY
6.0000 mg | PREFILLED_SYRINGE | Freq: Once | SUBCUTANEOUS | Status: AC
  Administered 2023-02-10: 6 mg via SUBCUTANEOUS
  Filled 2023-02-10: qty 0.6

## 2023-02-10 NOTE — Patient Instructions (Signed)
Implanted Port Home Guide An implanted port is a device that is placed under the skin. It is usually placed in the chest. The device may vary based on the need. Implanted ports can be used to give IV medicine, to take blood, or to give fluids. You may have an implanted port if: You need IV medicine that would be irritating to the small veins in your hands or arms. You need IV medicines, such as chemotherapy, for a long period of time. You need IV nutrition for a long period of time. You may have fewer limitations when using a port than you would if you used other types of long-term IVs. You will also likely be able to return to normal activities after your incision heals. An implanted port has two main parts: Reservoir. The reservoir is the part where a needle is inserted to give medicines or draw blood. The reservoir is round. After the port is placed, it appears as a small, raised area under your skin. Catheter. The catheter is a small, thin tube that connects the reservoir to a vein. Medicine that is inserted into the reservoir goes into the catheter and then into the vein. How is my port accessed? To access your port: A numbing cream may be placed on the skin over the port site. Your health care provider will put on a mask and sterile gloves. The skin over your port will be cleaned carefully with a germ-killing soap and allowed to dry. Your health care provider will gently pinch the port and insert a needle into it. Your health care provider will check for a blood return to make sure the port is in the vein and is still working (patent). If your port needs to remain accessed to get medicine continuously (constant infusion), your health care provider will place a clear bandage (dressing) over the needle site. The dressing and needle will need to be changed every week, or as told by your health care provider. What is flushing? Flushing helps keep the port working. Follow instructions from your  health care provider about how and when to flush the port. Ports are usually flushed with saline solution or a medicine called heparin. The need for flushing will depend on how the port is used: If the port is only used from time to time to give medicines or draw blood, the port may need to be flushed: Before and after medicines have been given. Before and after blood has been drawn. As part of routine maintenance. Flushing may be recommended every 4-6 weeks. If a constant infusion is running, the port may not need to be flushed. Throw away any syringes in a disposal container that is meant for sharp items (sharps container). You can buy a sharps container from a pharmacy, or you can make one by using an empty hard plastic bottle with a cover. How long will my port stay implanted? The port can stay in for as long as your health care provider thinks it is needed. When it is time for the port to come out, a surgery will be done to remove it. The surgery will be similar to the procedure that was done to put the port in. Follow these instructions at home: Caring for your port and port site Flush your port as told by your health care provider. If you need an infusion over several days, follow instructions from your health care provider about how to take care of your port site. Make sure you: Change your   dressing as told by your health care provider. Wash your hands with soap and water for at least 20 seconds before and after you change your dressing. If soap and water are not available, use alcohol-based hand sanitizer. Place any used dressings or infusion bags into a plastic bag. Throw that bag in the trash. Keep the dressing that covers the needle clean and dry. Do not get it wet. Do not use scissors or sharp objects near the infusion tubing. Keep any external tubes clamped, unless they are being used. Check your port site every day for signs of infection. Check for: Redness, swelling, or  pain. Fluid or blood. Warmth. Pus or a bad smell. Protect the skin around the port site. Avoid wearing bra straps that rub or irritate the site. Protect the skin around your port from seat belts. Place a soft pad over your chest if needed. Bathe or shower as told by your health care provider. The site may get wet as long as you are not actively receiving an infusion. General instructions  Return to your normal activities as told by your health care provider. Ask your health care provider what activities are safe for you. Carry a medical alert card or wear a medical alert bracelet at all times. This will let health care providers know that you have an implanted port in case of an emergency. Where to find more information American Cancer Society: www.cancer.org American Society of Clinical Oncology: www.cancer.net Contact a health care provider if: You have a fever or chills. You have redness, swelling, or pain at the port site. You have fluid or blood coming from your port site. Your incision feels warm to the touch. You have pus or a bad smell coming from the port site. Summary Implanted ports are usually placed in the chest for long-term IV access. Follow instructions from your health care provider about flushing the port and changing bandages (dressings). Take care of the area around your port by avoiding clothing that puts pressure on the area, and by watching for signs of infection. Protect the skin around your port from seat belts. Place a soft pad over your chest if needed. Contact a health care provider if you have a fever or you have redness, swelling, pain, fluid, or a bad smell at the port site. This information is not intended to replace advice given to you by your health care provider. Make sure you discuss any questions you have with your health care provider. Document Revised: 04/22/2021 Document Reviewed: 04/22/2021 Elsevier Patient Education  2023 Elsevier  Inc.  Pegfilgrastim Injection What is this medication? PEGFILGRASTIM (PEG fil gra stim) lowers the risk of infection in people who are receiving chemotherapy. It works by helping your body make more white blood cells, which protects your body from infection. It may also be used to help people who have been exposed to high doses of radiation. This medicine may be used for other purposes; ask your health care provider or pharmacist if you have questions. COMMON BRAND NAME(S): Fulphila, Fylnetra, Neulasta, Nyvepria, Stimufend, UDENYCA, Ziextenzo What should I tell my care team before I take this medication? They need to know if you have any of these conditions: Kidney disease Latex allergy Ongoing radiation therapy Sickle cell disease Skin reactions to acrylic adhesives (On-Body Injector only) An unusual or allergic reaction to pegfilgrastim, filgrastim, other medications, foods, dyes, or preservatives Pregnant or trying to get pregnant Breast-feeding How should I use this medication? This medication is for injection under the   skin. If you get this medication at home, you will be taught how to prepare and give the pre-filled syringe or how to use the On-body Injector. Refer to the patient Instructions for Use for detailed instructions. Use exactly as directed. Tell your care team immediately if you suspect that the On-body Injector may not have performed as intended or if you suspect the use of the On-body Injector resulted in a missed or partial dose. It is important that you put your used needles and syringes in a special sharps container. Do not put them in a trash can. If you do not have a sharps container, call your pharmacist or care team to get one. Talk to your care team about the use of this medication in children. While this medication may be prescribed for selected conditions, precautions do apply. Overdosage: If you think you have taken too much of this medicine contact a poison control  center or emergency room at once. NOTE: This medicine is only for you. Do not share this medicine with others. What if I miss a dose? It is important not to miss your dose. Call your care team if you miss your dose. If you miss a dose due to an On-body Injector failure or leakage, a new dose should be administered as soon as possible using a single prefilled syringe for manual use. What may interact with this medication? Interactions have not been studied. This list may not describe all possible interactions. Give your health care provider a list of all the medicines, herbs, non-prescription drugs, or dietary supplements you use. Also tell them if you smoke, drink alcohol, or use illegal drugs. Some items may interact with your medicine. What should I watch for while using this medication? Your condition will be monitored carefully while you are receiving this medication. You may need blood work done while you are taking this medication. Talk to your care team about your risk of cancer. You may be more at risk for certain types of cancer if you take this medication. If you are going to need a MRI, CT scan, or other procedure, tell your care team that you are using this medication (On-Body Injector only). What side effects may I notice from receiving this medication? Side effects that you should report to your care team as soon as possible: Allergic reactions--skin rash, itching, hives, swelling of the face, lips, tongue, or throat Capillary leak syndrome--stomach or muscle pain, unusual weakness or fatigue, feeling faint or lightheaded, decrease in the amount of urine, swelling of the ankles, hands, or feet, trouble breathing High white blood cell level--fever, fatigue, trouble breathing, night sweats, change in vision, weight loss Inflammation of the aorta--fever, fatigue, back, chest, or stomach pain, severe headache Kidney injury (glomerulonephritis)--decrease in the amount of urine, red or dark  brown urine, foamy or bubbly urine, swelling of the ankles, hands, or feet Shortness of breath or trouble breathing Spleen injury--pain in upper left stomach or shoulder Unusual bruising or bleeding Side effects that usually do not require medical attention (report to your care team if they continue or are bothersome): Bone pain Pain in the hands or feet This list may not describe all possible side effects. Call your doctor for medical advice about side effects. You may report side effects to FDA at 1-800-FDA-1088. Where should I keep my medication? Keep out of the reach of children. If you are using this medication at home, you will be instructed on how to store it. Throw away any unused   medication after the expiration date on the label. NOTE: This sheet is a summary. It may not cover all possible information. If you have questions about this medicine, talk to your doctor, pharmacist, or health care provider.  2023 Elsevier/Gold Standard (2021-07-04 00:00:00)  

## 2023-02-21 ENCOUNTER — Other Ambulatory Visit: Payer: Self-pay | Admitting: Oncology

## 2023-02-22 ENCOUNTER — Inpatient Hospital Stay: Payer: Self-pay

## 2023-02-22 ENCOUNTER — Inpatient Hospital Stay (HOSPITAL_BASED_OUTPATIENT_CLINIC_OR_DEPARTMENT_OTHER): Payer: Self-pay | Admitting: Nurse Practitioner

## 2023-02-22 ENCOUNTER — Encounter: Payer: Self-pay | Admitting: Nurse Practitioner

## 2023-02-22 VITALS — BP 183/87 | HR 65 | Resp 18

## 2023-02-22 VITALS — BP 168/78 | HR 73 | Temp 98.2°F | Resp 20 | Ht 67.0 in | Wt 155.4 lb

## 2023-02-22 DIAGNOSIS — C182 Malignant neoplasm of ascending colon: Secondary | ICD-10-CM

## 2023-02-22 DIAGNOSIS — Z95828 Presence of other vascular implants and grafts: Secondary | ICD-10-CM

## 2023-02-22 LAB — CBC WITH DIFFERENTIAL (CANCER CENTER ONLY)
Abs Immature Granulocytes: 0.17 10*3/uL — ABNORMAL HIGH (ref 0.00–0.07)
Basophils Absolute: 0.1 10*3/uL (ref 0.0–0.1)
Basophils Relative: 1 %
Eosinophils Absolute: 0.3 10*3/uL (ref 0.0–0.5)
Eosinophils Relative: 3 %
HCT: 26 % — ABNORMAL LOW (ref 39.0–52.0)
Hemoglobin: 7.7 g/dL — ABNORMAL LOW (ref 13.0–17.0)
Immature Granulocytes: 2 %
Lymphocytes Relative: 14 %
Lymphs Abs: 1.4 10*3/uL (ref 0.7–4.0)
MCH: 21.3 pg — ABNORMAL LOW (ref 26.0–34.0)
MCHC: 29.6 g/dL — ABNORMAL LOW (ref 30.0–36.0)
MCV: 72 fL — ABNORMAL LOW (ref 80.0–100.0)
Monocytes Absolute: 0.7 10*3/uL (ref 0.1–1.0)
Monocytes Relative: 7 %
Neutro Abs: 7.3 10*3/uL (ref 1.7–7.7)
Neutrophils Relative %: 73 %
Platelet Count: 452 10*3/uL — ABNORMAL HIGH (ref 150–400)
RBC: 3.61 MIL/uL — ABNORMAL LOW (ref 4.22–5.81)
RDW: 25.5 % — ABNORMAL HIGH (ref 11.5–15.5)
WBC Count: 10 10*3/uL (ref 4.0–10.5)
nRBC: 0 % (ref 0.0–0.2)

## 2023-02-22 LAB — CMP (CANCER CENTER ONLY)
ALT: 6 U/L (ref 0–44)
AST: 10 U/L — ABNORMAL LOW (ref 15–41)
Albumin: 3.1 g/dL — ABNORMAL LOW (ref 3.5–5.0)
Alkaline Phosphatase: 124 U/L (ref 38–126)
Anion gap: 7 (ref 5–15)
BUN: 11 mg/dL (ref 8–23)
CO2: 30 mmol/L (ref 22–32)
Calcium: 8.1 mg/dL — ABNORMAL LOW (ref 8.9–10.3)
Chloride: 102 mmol/L (ref 98–111)
Creatinine: 0.77 mg/dL (ref 0.61–1.24)
GFR, Estimated: >60 mL/min (ref >60)
Glucose, Bld: 103 mg/dL — ABNORMAL HIGH (ref 70–99)
Potassium: 3 mmol/L — ABNORMAL LOW (ref 3.5–5.1)
Sodium: 139 mmol/L (ref 135–145)
Total Bilirubin: 0.3 mg/dL (ref 0.3–1.2)
Total Protein: 6 g/dL — ABNORMAL LOW (ref 6.5–8.1)

## 2023-02-22 LAB — CEA (ACCESS): CEA (CHCC): 8.37 ng/mL — ABNORMAL HIGH (ref 0.00–5.00)

## 2023-02-22 MED ORDER — SODIUM CHLORIDE 0.9 % IV SOLN
400.0000 mg/m2 | Freq: Once | INTRAVENOUS | Status: DC
  Filled 2023-02-22: qty 36.4

## 2023-02-22 MED ORDER — SODIUM CHLORIDE 0.9 % IV SOLN
400.0000 mg/m2 | Freq: Once | INTRAVENOUS | Status: AC
  Administered 2023-02-22: 728 mg via INTRAVENOUS
  Filled 2023-02-22: qty 36.4

## 2023-02-22 MED ORDER — SODIUM CHLORIDE 0.9 % IV SOLN
135.0000 mg/m2 | Freq: Once | INTRAVENOUS | Status: AC
  Administered 2023-02-22: 240 mg via INTRAVENOUS
  Filled 2023-02-22: qty 10

## 2023-02-22 MED ORDER — SODIUM CHLORIDE 0.9% FLUSH
10.0000 mL | INTRAVENOUS | Status: DC | PRN
  Administered 2023-02-22: 10 mL via INTRAVENOUS

## 2023-02-22 MED ORDER — ATROPINE SULFATE 1 MG/ML IV SOLN
0.5000 mg | Freq: Once | INTRAVENOUS | Status: AC | PRN
  Administered 2023-02-22: 0.5 mg via INTRAVENOUS
  Filled 2023-02-22: qty 1

## 2023-02-22 MED ORDER — SODIUM CHLORIDE 0.9 % IV SOLN
2000.0000 mg/m2 | INTRAVENOUS | Status: DC
  Administered 2023-02-22: 3500 mg via INTRAVENOUS
  Filled 2023-02-22: qty 70

## 2023-02-22 MED ORDER — SODIUM CHLORIDE 0.9 % IV SOLN
10.0000 mg | Freq: Once | INTRAVENOUS | Status: AC
  Administered 2023-02-22: 10 mg via INTRAVENOUS
  Filled 2023-02-22: qty 10

## 2023-02-22 MED ORDER — FLUOROURACIL CHEMO INJECTION 2.5 GM/50ML
400.0000 mg/m2 | Freq: Once | INTRAVENOUS | Status: AC
  Administered 2023-02-22: 750 mg via INTRAVENOUS
  Filled 2023-02-22: qty 15

## 2023-02-22 MED ORDER — PALONOSETRON HCL INJECTION 0.25 MG/5ML
0.2500 mg | Freq: Once | INTRAVENOUS | Status: AC
  Administered 2023-02-22: 0.25 mg via INTRAVENOUS
  Filled 2023-02-22: qty 5

## 2023-02-22 MED ORDER — SODIUM CHLORIDE 0.9 % IV SOLN: Freq: Once | INTRAVENOUS | Status: AC

## 2023-02-22 NOTE — Progress Notes (Signed)
[facility] OFFICE PROGRESS NOTE   Diagnosis: Colon cancer  INTERVAL HISTORY:   [name] returns as scheduled.  He completed cycle 4 FOLFIRI 02/08/2023.  He denies nausea/vomiting.  No mouth sores.  He has intermittent loose stools.  No more than 3/day.  He thinks the abdominal mass is smaller.  Objective:  Vital signs in last 24 hours:  Blood pressure (!) 168/78, pulse 73, temperature 98.2 F (36.8 C), temperature source Oral, resp. rate 20, height  (1.702 m), weight 155 lb 6.4 oz (70.5 kg), SpO2 98 %.    HEENT: No thrush or ulcers. Resp: Lungs clear bilaterally.   Cardio: Regular rate and rhythm. GI: Mid abdominal mass is more mobile.  No hepatosplenomegaly. Vascular: Pitting edema lower leg bilaterally left greater than right. Skin: Palms without erythema. Port-A-Cath without erythema.  Lab Results:  Lab Results  Component Value Date   WBC 10.0 02/22/2023   HGB 7.7 (L) 02/22/2023   HCT 26.0 (L) 02/22/2023   MCV 72.0 (L) 02/22/2023   PLT 452 (H) 02/22/2023   NEUTROABS 7.3 02/22/2023    Imaging:  No results found.  Medications: I have reviewed the patient's current medications.  Assessment/Plan: Colon cancer  CT abdomen/pelvis 08/02/2020-small amount of free fluid in the pelvis, postsurgical change involving the left colon, trace right pleural fluid.   Colonoscopy 08/04/2020-cecal mass as well as a 3 mm polyp in the ascending colon, 9 mm polyp in the ascending colon, 3 mm polyp in the transverse colon and 4 small polyps in the transverse colon.  Biopsy of the cecal mass showed adenocarcinoma.  Pathology on the polyps showed tubular adenomas with no high-grade dysplasia or malignancy identified. CT chest 08/04/2020-no evidence of metastatic disease CEA 08/04/2020-2.0 Laparoscopic right colectomy 10/17/2020 by Dr. Nigel Mormon showed adenocarcinoma with mucinous features, moderately differentiated, 6.7 cm; no carcinoma identified and 23 lymph  nodes; margins uninvolved; no macroscopic tumor perforation identified; tumor invaded through muscularis propria into pericolorectal tissue; no lymphovascular invasion and no perineural invasion, pT3, pN0, mismatch repair protein IHC normal, MSI stable.   06/18/2022-CT abdomen/pelvis-homogenous soft tissue mass in the anterior abdomen deep to the umbilicus, 5 mm left lower lobe nodule, new from October 2021 11/04/2022 biopsy of abdominal mass-metastatic mucinous adenocarcinoma, mismatch repair protein IHC normal, foundation 1 pending CTs 11/18/2022-large mass central lower abdomen is increased in size.  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  No evidence of obstruction.  Irregular lesions right lobe of the liver likely new, compatible with metastatic disease.  Increase size of a solid left lower lobe pulmonary nodule likely due to metastatic disease.  Solid pulmonary nodule left upper lobe increased in size likely due to indolent primary lung malignancy. Foundation 1 testing (date of collection 10/17/2020, specimen received 11/24/2022)-microsatellite status is equivocal; tumor mutation burden 6; K-ras wild-type; NRAS G12S; BRAF [id number] Cycle 1 FOLFIRI 12/07/2022 Chemotherapy held 12/21/2022 due to neutropenia Chemotherapy held 12/28/2022 per patient due to upcoming dental extractions Cycle 2 FOLFIRI 01/11/2023, irinotecan dose reduced secondary to diarrhea neutropenia, Udenyca Cycle 3 FOLFIRI 01/25/2023 Cycle 4 FOLFIRI 02/08/2023 Cycle 5 FOLFIRI 02/22/2023 Iron deficiency anemia October 2021  08/02/2020 hemoglobin 3.8, MCV 56, ferritin 3, stool positive for occult blood 10/19/2020 hemoglobin 7.3, MCV 74 11/04/2022 hemoglobin 4.5 11/05/2022 ferritin 3 Ferrous sulfate 325 mg twice daily recommended 11/06/2022 2 units packed red blood cells 11/17/2022 stool cards positive for blood 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 He completed cycle 1 CAPOX beginning 11/13/2011; cycle 7 beginning 03/23/2012. Cycle 8 was completed beginning on 04/13/2012 without oxaliplatin. Restaging CT scans 06/06/2012 without evidence of recurrent disease.   Family history-brother with colon cancer Multiple polyps on colonoscopy 08/04/2020  Port-A-Cath placement 12/04/2022, Interventional Radiology     Disposition: [name] appears stable.  He has completed 4 cycles of FOLFIRI.  He continues to tolerate chemotherapy well.  Plan to proceed with cycle 5 today as scheduled.  Restaging CTs prior to next office visit.  CBC reviewed.  Counts adequate to proceed as above.  He has stable anemia.  He declines red cell transfusion support.  He will return for follow-up in 2 weeks.  He will contact the office in the interim with any problems.    Lonna Cobb ANP/GNP-BC   02/22/2023  11:06 AM

## 2023-02-22 NOTE — Progress Notes (Signed)
Patient seen by Lonna Cobb, NP today  Vitals are within treatment parameters.   Labs reviewed by Lonna Cobb, NP and are NOT within treatment parameters (Hgb 7.7).  Per physician team, ok to treat with Hgb 7.7 and there are NO modifications to the treatment plan.  Patient has declined blood transfusion.

## 2023-02-22 NOTE — Patient Instructions (Signed)
[place] [facility] AT [facility]  Discharge Instructions: Thank you for choosing [facility] to provide your oncology and hematology care.   If you have a lab appointment with the Cancer Center, please go directly to the Cancer Center and check in at the registration area.   Wear comfortable clothing and clothing appropriate for easy access to any Portacath or PICC line.   We strive to give you quality time with your provider. You may need to reschedule your appointment if you arrive late (15 or more minutes).  Arriving late affects you and other patients whose appointments are after yours.  Also, if you miss three or more appointments without notifying the office, you may be dismissed from the clinic at the provider's discretion.      For prescription refill requests, have your pharmacy contact our office and allow 72 hours for refills to be completed.    Today you received the following chemotherapy and/or immunotherapy agents Irinotecan, Leucovorin and 5FU      To help prevent nausea and vomiting after your treatment, we encourage you to take your nausea medication as directed.  BELOW ARE SYMPTOMS THAT SHOULD BE REPORTED IMMEDIATELY: *FEVER GREATER THAN 100.4 F (38 C) OR HIGHER *CHILLS OR SWEATING *NAUSEA AND VOMITING THAT IS NOT CONTROLLED WITH YOUR NAUSEA MEDICATION *UNUSUAL SHORTNESS OF BREATH *UNUSUAL BRUISING OR BLEEDING *URINARY PROBLEMS (pain or burning when urinating, or frequent urination) *BOWEL PROBLEMS (unusual diarrhea, constipation, pain near the anus) TENDERNESS IN MOUTH AND THROAT WITH OR WITHOUT PRESENCE OF ULCERS (sore throat, sores in mouth, or a toothache) UNUSUAL RASH, SWELLING OR PAIN  UNUSUAL VAGINAL DISCHARGE OR ITCHING   Items with * indicate a potential emergency and should be followed up as soon as possible or go to the Emergency Department if any problems should occur.  Please show the CHEMOTHERAPY ALERT CARD or IMMUNOTHERAPY  ALERT CARD at check-in to the Emergency Department and triage nurse.  Should you have questions after your visit or need to cancel or reschedule your appointment, please contact [place] [facility]  Dept: [phone]  and follow the prompts.  Office hours are 8:00 a.m. to 4:30 p.m. Monday - Friday. Please note that voicemails left after 4:00 p.m. may not be returned until the following business day.  We are closed weekends and major holidays. You have access to a nurse at all times for urgent questions. Please call the main number to the clinic Dept: [phone] and follow the prompts.   For any non-urgent questions, you may also contact your provider using MyChart. We now offer e-Visits for anyone 34 and older to request care online for non-urgent symptoms. For details visit [email or web address].   Also download the MyChart app! Go to the app store, search "MyChart", open the app, select [facility], and log in with your MyChart username and password.

## 2023-02-24 ENCOUNTER — Ambulatory Visit (HOSPITAL_BASED_OUTPATIENT_CLINIC_OR_DEPARTMENT_OTHER)
Admission: RE | Admit: 2023-02-24 | Discharge: 2023-02-24 | Disposition: A | Discharge status: Home / Self Care | Payer: Self-pay | Source: Ambulatory Visit | Attending: Nurse Practitioner | Admitting: Nurse Practitioner

## 2023-02-24 ENCOUNTER — Inpatient Hospital Stay: Payer: Self-pay

## 2023-02-24 VITALS — BP 129/62 | HR 70 | Temp 97.7°F | Resp 18

## 2023-02-24 DIAGNOSIS — C182 Malignant neoplasm of ascending colon: Secondary | ICD-10-CM | POA: Insufficient documentation

## 2023-02-24 MED ORDER — HEPARIN SOD (PORK) LOCK FLUSH 100 UNIT/ML IV SOLN
500.0000 [IU] | Freq: Once | INTRAVENOUS | Status: AC
Start: 2023-02-24 — End: 2023-02-24
  Administered 2023-02-24: 500 [IU] via INTRAVENOUS

## 2023-02-24 MED ORDER — PEGFILGRASTIM-CBQV 6 MG/0.6ML ~~LOC~~ SOSY
6.0000 mg | PREFILLED_SYRINGE | Freq: Once | SUBCUTANEOUS | Status: AC
  Administered 2023-02-24: 6 mg via SUBCUTANEOUS
  Filled 2023-02-24: qty 0.6

## 2023-02-24 MED ORDER — HEPARIN SOD (PORK) LOCK FLUSH 100 UNIT/ML IV SOLN
500.0000 [IU] | Freq: Once | INTRAVENOUS | Status: AC | PRN
  Administered 2023-02-24: 500 [IU]

## 2023-02-24 MED ORDER — IOHEXOL 300 MG/ML  SOLN
100.0000 mL | Freq: Once | INTRAMUSCULAR | Status: AC | PRN
  Administered 2023-02-24: 80 mL via INTRAVENOUS

## 2023-02-24 MED ORDER — SODIUM CHLORIDE 0.9% FLUSH
10.0000 mL | INTRAVENOUS | Status: DC | PRN
  Administered 2023-02-24: 10 mL

## 2023-02-24 NOTE — Patient Instructions (Signed)
Implanted Port Home Guide An implanted port is a device that is placed under the skin. It is usually placed in the chest. The device may vary based on the need. Implanted ports can be used to give IV medicine, to take blood, or to give fluids. You may have an implanted port if: You need IV medicine that would be irritating to the small veins in your hands or arms. You need IV medicines, such as chemotherapy, for a long period of time. You need IV nutrition for a long period of time. You may have fewer limitations when using a port than you would if you used other types of long-term IVs. You will also likely be able to return to normal activities after your incision heals. An implanted port has two main parts: Reservoir. The reservoir is the part where a needle is inserted to give medicines or draw blood. The reservoir is round. After the port is placed, it appears as a small, raised area under your skin. Catheter. The catheter is a small, thin tube that connects the reservoir to a vein. Medicine that is inserted into the reservoir goes into the catheter and then into the vein. How is my port accessed? To access your port: A numbing cream may be placed on the skin over the port site. Your health care provider will put on a mask and sterile gloves. The skin over your port will be cleaned carefully with a germ-killing soap and allowed to dry. Your health care provider will gently pinch the port and insert a needle into it. Your health care provider will check for a blood return to make sure the port is in the vein and is still working (patent). If your port needs to remain accessed to get medicine continuously (constant infusion), your health care provider will place a clear bandage (dressing) over the needle site. The dressing and needle will need to be changed every week, or as told by your health care provider. What is flushing? Flushing helps keep the port working. Follow instructions from your  health care provider about how and when to flush the port. Ports are usually flushed with saline solution or a medicine called heparin. The need for flushing will depend on how the port is used: If the port is only used from time to time to give medicines or draw blood, the port may need to be flushed: Before and after medicines have been given. Before and after blood has been drawn. As part of routine maintenance. Flushing may be recommended every 4-6 weeks. If a constant infusion is running, the port may not need to be flushed. Throw away any syringes in a disposal container that is meant for sharp items (sharps container). You can buy a sharps container from a pharmacy, or you can make one by using an empty hard plastic bottle with a cover. How long will my port stay implanted? The port can stay in for as long as your health care provider thinks it is needed. When it is time for the port to come out, a surgery will be done to remove it. The surgery will be similar to the procedure that was done to put the port in. Follow these instructions at home: Caring for your port and port site Flush your port as told by your health care provider. If you need an infusion over several days, follow instructions from your health care provider about how to take care of your port site. Make sure you: Change your   dressing as told by your health care provider. Wash your hands with soap and water for at least 20 seconds before and after you change your dressing. If soap and water are not available, use alcohol-based hand sanitizer. Place any used dressings or infusion bags into a plastic bag. Throw that bag in the trash. Keep the dressing that covers the needle clean and dry. Do not get it wet. Do not use scissors or sharp objects near the infusion tubing. Keep any external tubes clamped, unless they are being used. Check your port site every day for signs of infection. Check for: Redness, swelling, or  pain. Fluid or blood. Warmth. Pus or a bad smell. Protect the skin around the port site. Avoid wearing bra straps that rub or irritate the site. Protect the skin around your port from seat belts. Place a soft pad over your chest if needed. Bathe or shower as told by your health care provider. The site may get wet as long as you are not actively receiving an infusion. General instructions  Return to your normal activities as told by your health care provider. Ask your health care provider what activities are safe for you. Carry a medical alert card or wear a medical alert bracelet at all times. This will let health care providers know that you have an implanted port in case of an emergency. Where to find more information American Cancer Society: www.cancer.org American Society of Clinical Oncology: www.cancer.net Contact a health care provider if: You have a fever or chills. You have redness, swelling, or pain at the port site. You have fluid or blood coming from your port site. Your incision feels warm to the touch. You have pus or a bad smell coming from the port site. Summary Implanted ports are usually placed in the chest for long-term IV access. Follow instructions from your health care provider about flushing the port and changing bandages (dressings). Take care of the area around your port by avoiding clothing that puts pressure on the area, and by watching for signs of infection. Protect the skin around your port from seat belts. Place a soft pad over your chest if needed. Contact a health care provider if you have a fever or you have redness, swelling, pain, fluid, or a bad smell at the port site. This information is not intended to replace advice given to you by your health care provider. Make sure you discuss any questions you have with your health care provider. Document Revised: 04/22/2021 Document Reviewed: 04/22/2021 Elsevier Patient Education  2023 Elsevier  Inc.  Pegfilgrastim Injection What is this medication? PEGFILGRASTIM (PEG fil gra stim) lowers the risk of infection in people who are receiving chemotherapy. It works by helping your body make more white blood cells, which protects your body from infection. It may also be used to help people who have been exposed to high doses of radiation. This medicine may be used for other purposes; ask your health care provider or pharmacist if you have questions. COMMON BRAND NAME(S): Fulphila, Fylnetra, Neulasta, Nyvepria, Stimufend, UDENYCA, Ziextenzo What should I tell my care team before I take this medication? They need to know if you have any of these conditions: Kidney disease Latex allergy Ongoing radiation therapy Sickle cell disease Skin reactions to acrylic adhesives (On-Body Injector only) An unusual or allergic reaction to pegfilgrastim, filgrastim, other medications, foods, dyes, or preservatives Pregnant or trying to get pregnant Breast-feeding How should I use this medication? This medication is for injection under the   skin. If you get this medication at home, you will be taught how to prepare and give the pre-filled syringe or how to use the On-body Injector. Refer to the patient Instructions for Use for detailed instructions. Use exactly as directed. Tell your care team immediately if you suspect that the On-body Injector may not have performed as intended or if you suspect the use of the On-body Injector resulted in a missed or partial dose. It is important that you put your used needles and syringes in a special sharps container. Do not put them in a trash can. If you do not have a sharps container, call your pharmacist or care team to get one. Talk to your care team about the use of this medication in children. While this medication may be prescribed for selected conditions, precautions do apply. Overdosage: If you think you have taken too much of this medicine contact a poison control  center or emergency room at once. NOTE: This medicine is only for you. Do not share this medicine with others. What if I miss a dose? It is important not to miss your dose. Call your care team if you miss your dose. If you miss a dose due to an On-body Injector failure or leakage, a new dose should be administered as soon as possible using a single prefilled syringe for manual use. What may interact with this medication? Interactions have not been studied. This list may not describe all possible interactions. Give your health care provider a list of all the medicines, herbs, non-prescription drugs, or dietary supplements you use. Also tell them if you smoke, drink alcohol, or use illegal drugs. Some items may interact with your medicine. What should I watch for while using this medication? Your condition will be monitored carefully while you are receiving this medication. You may need blood work done while you are taking this medication. Talk to your care team about your risk of cancer. You may be more at risk for certain types of cancer if you take this medication. If you are going to need a MRI, CT scan, or other procedure, tell your care team that you are using this medication (On-Body Injector only). What side effects may I notice from receiving this medication? Side effects that you should report to your care team as soon as possible: Allergic reactions--skin rash, itching, hives, swelling of the face, lips, tongue, or throat Capillary leak syndrome--stomach or muscle pain, unusual weakness or fatigue, feeling faint or lightheaded, decrease in the amount of urine, swelling of the ankles, hands, or feet, trouble breathing High white blood cell level--fever, fatigue, trouble breathing, night sweats, change in vision, weight loss Inflammation of the aorta--fever, fatigue, back, chest, or stomach pain, severe headache Kidney injury (glomerulonephritis)--decrease in the amount of urine, red or dark  brown urine, foamy or bubbly urine, swelling of the ankles, hands, or feet Shortness of breath or trouble breathing Spleen injury--pain in upper left stomach or shoulder Unusual bruising or bleeding Side effects that usually do not require medical attention (report to your care team if they continue or are bothersome): Bone pain Pain in the hands or feet This list may not describe all possible side effects. Call your doctor for medical advice about side effects. You may report side effects to FDA at 1-800-FDA-1088. Where should I keep my medication? Keep out of the reach of children. If you are using this medication at home, you will be instructed on how to store it. Throw away any unused   medication after the expiration date on the label. NOTE: This sheet is a summary. It may not cover all possible information. If you have questions about this medicine, talk to your doctor, pharmacist, or health care provider.  2023 Elsevier/Gold Standard (2021-07-04 00:00:00)  

## 2023-03-02 ENCOUNTER — Other Ambulatory Visit: Payer: Self-pay

## 2023-03-02 DIAGNOSIS — C182 Malignant neoplasm of ascending colon: Secondary | ICD-10-CM

## 2023-03-07 ENCOUNTER — Other Ambulatory Visit: Payer: Self-pay | Admitting: Oncology

## 2023-03-07 DIAGNOSIS — C182 Malignant neoplasm of ascending colon: Secondary | ICD-10-CM

## 2023-03-08 ENCOUNTER — Inpatient Hospital Stay: Payer: Self-pay

## 2023-03-08 ENCOUNTER — Inpatient Hospital Stay: Payer: Self-pay | Attending: Oncology

## 2023-03-08 ENCOUNTER — Encounter: Payer: Self-pay | Admitting: Nurse Practitioner

## 2023-03-08 ENCOUNTER — Encounter: Payer: Self-pay | Admitting: Oncology

## 2023-03-08 ENCOUNTER — Inpatient Hospital Stay (HOSPITAL_BASED_OUTPATIENT_CLINIC_OR_DEPARTMENT_OTHER): Payer: Self-pay | Admitting: Nurse Practitioner

## 2023-03-08 VITALS — BP 173/73 | HR 71 | Temp 98.1°F | Resp 20 | Ht 67.0 in | Wt 155.5 lb

## 2023-03-08 DIAGNOSIS — Z9049 Acquired absence of other specified parts of digestive tract: Secondary | ICD-10-CM | POA: Insufficient documentation

## 2023-03-08 DIAGNOSIS — Z800 Family history of malignant neoplasm of digestive organs: Secondary | ICD-10-CM | POA: Insufficient documentation

## 2023-03-08 DIAGNOSIS — C7989 Secondary malignant neoplasm of other specified sites: Secondary | ICD-10-CM | POA: Insufficient documentation

## 2023-03-08 DIAGNOSIS — Z95828 Presence of other vascular implants and grafts: Secondary | ICD-10-CM

## 2023-03-08 DIAGNOSIS — C182 Malignant neoplasm of ascending colon: Secondary | ICD-10-CM

## 2023-03-08 DIAGNOSIS — Z5111 Encounter for antineoplastic chemotherapy: Secondary | ICD-10-CM | POA: Insufficient documentation

## 2023-03-08 DIAGNOSIS — C180 Malignant neoplasm of cecum: Secondary | ICD-10-CM | POA: Insufficient documentation

## 2023-03-08 DIAGNOSIS — Z933 Colostomy status: Secondary | ICD-10-CM | POA: Insufficient documentation

## 2023-03-08 DIAGNOSIS — C787 Secondary malignant neoplasm of liver and intrahepatic bile duct: Secondary | ICD-10-CM | POA: Insufficient documentation

## 2023-03-08 LAB — CMP (CANCER CENTER ONLY)
ALT: 6 U/L (ref 0–44)
AST: 10 U/L — ABNORMAL LOW (ref 15–41)
Albumin: 3.2 g/dL — ABNORMAL LOW (ref 3.5–5.0)
Alkaline Phosphatase: 125 U/L (ref 38–126)
Anion gap: 7 (ref 5–15)
BUN: 9 mg/dL (ref 8–23)
CO2: 30 mmol/L (ref 22–32)
Calcium: 8.2 mg/dL — ABNORMAL LOW (ref 8.9–10.3)
Chloride: 102 mmol/L (ref 98–111)
Creatinine: 0.86 mg/dL (ref 0.61–1.24)
GFR, Estimated: >60 mL/min (ref >60)
Glucose, Bld: 85 mg/dL (ref 70–99)
Potassium: 2.9 mmol/L — ABNORMAL LOW (ref 3.5–5.1)
Sodium: 139 mmol/L (ref 135–145)
Total Bilirubin: 0.3 mg/dL (ref 0.3–1.2)
Total Protein: 6.2 g/dL — ABNORMAL LOW (ref 6.5–8.1)

## 2023-03-08 LAB — CBC WITH DIFFERENTIAL (CANCER CENTER ONLY)
Abs Immature Granulocytes: 0.14 10*3/uL — ABNORMAL HIGH (ref 0.00–0.07)
Basophils Absolute: 0.1 10*3/uL (ref 0.0–0.1)
Basophils Relative: 1 %
Eosinophils Absolute: 0.3 10*3/uL (ref 0.0–0.5)
Eosinophils Relative: 3 %
HCT: 26.7 % — ABNORMAL LOW (ref 39.0–52.0)
Hemoglobin: 7.6 g/dL — ABNORMAL LOW (ref 13.0–17.0)
Immature Granulocytes: 1 %
Lymphocytes Relative: 15 %
Lymphs Abs: 1.6 10*3/uL (ref 0.7–4.0)
MCH: 20.7 pg — ABNORMAL LOW (ref 26.0–34.0)
MCHC: 28.5 g/dL — ABNORMAL LOW (ref 30.0–36.0)
MCV: 72.6 fL — ABNORMAL LOW (ref 80.0–100.0)
Monocytes Absolute: 0.7 10*3/uL (ref 0.1–1.0)
Monocytes Relative: 6 %
Neutro Abs: 7.6 10*3/uL (ref 1.7–7.7)
Neutrophils Relative %: 74 %
Platelet Count: 503 10*3/uL — ABNORMAL HIGH (ref 150–400)
RBC: 3.68 MIL/uL — ABNORMAL LOW (ref 4.22–5.81)
RDW: 25.2 % — ABNORMAL HIGH (ref 11.5–15.5)
WBC Count: 10.3 10*3/uL (ref 4.0–10.5)
nRBC: 0 % (ref 0.0–0.2)

## 2023-03-08 LAB — MAGNESIUM: Magnesium: 2.1 mg/dL (ref 1.7–2.4)

## 2023-03-08 LAB — CEA (ACCESS): CEA (CHCC): 8.93 ng/mL — ABNORMAL HIGH (ref 0.00–5.00)

## 2023-03-08 MED ORDER — SODIUM CHLORIDE 0.9% FLUSH
10.0000 mL | INTRAVENOUS | Status: AC | PRN
  Administered 2023-03-08: 10 mL via INTRAVENOUS

## 2023-03-08 MED ORDER — HEPARIN SOD (PORK) LOCK FLUSH 100 UNIT/ML IV SOLN
500.0000 [IU] | Freq: Once | INTRAVENOUS | Status: AC
  Administered 2023-03-08: 500 [IU] via INTRAVENOUS

## 2023-03-08 NOTE — Addendum Note (Signed)
Addended by: Dimitri Ped on: 03/08/2023 12:16 PM   Modules accepted: Orders

## 2023-03-08 NOTE — Progress Notes (Signed)
DISCONTINUE ON PATHWAY REGIMEN - Colorectal     A cycle is every 14 days:     Irinotecan      Leucovorin      Fluorouracil      Fluorouracil   **Always confirm dose/schedule in your pharmacy ordering system**  REASON: Disease Progression PRIOR TREATMENT: [id number]: FOLFIRI TREATMENT RESPONSE: Progressive Disease (PD)  START OFF PATHWAY REGIMEN - Colorectal   [id number] (Leucovorin IV D1 + Fluorouracil IV D1/CIV D1,2 + Oxaliplatin IV D1) q14 Days:   A cycle is every 14 days:     Oxaliplatin      Leucovorin      Fluorouracil      Fluorouracil   **Always confirm dose/schedule in your pharmacy ordering system**  Patient Characteristics: Distant Metastases, Nonsurgical Candidate, BRAF V600 Mutation Positive, KRAS/NRAS Mutation Positive/Unknown, and MSS/pMMR or MSI Unknown Tumor Location: Colon Therapeutic Status: Distant Metastases Microsatellite/Mismatch Repair Status: MSS/pMMR BRAF Mutation Status: Mutation Positive KRAS/NRAS Mutation Status: Non-KRAS G12C, RAS Mutation Positive Intent of Therapy: Non-Curative / Palliative Intent, Discussed with Patient

## 2023-03-08 NOTE — Progress Notes (Signed)
[facility] OFFICE PROGRESS NOTE   Diagnosis: Colon cancer  INTERVAL HISTORY:   Mr. Nordahl returns as scheduled.  He completed cycle 5 FOLFIRI 02/22/2023.  He denies nausea.  No mouth sores.  He estimates 1 loose stool a day for about 4 days after each treatment.  Occasional numbness/tingling in the feet.  Objective:  Vital signs in last 24 hours:  Blood pressure (!) 173/73, pulse 71, temperature 98.1 F (36.7 C), temperature source Oral, resp. rate 20, height 5\' 7"  (1.702 m), weight 155 lb 8 oz (70.5 kg), SpO2 100 %.    HEENT: No thrush or ulcers. Resp: Lungs clear bilaterally. Cardio: Regular rate and rhythm. GI: Mid abdominal mass appears stable.  No hepatomegaly. Vascular: No leg edema. Skin: Palms without erythema. Port-A-Cath without erythema.  Lab Results:  Lab Results  Component Value Date   WBC 10.3 03/08/2023   HGB 7.6 (L) 03/08/2023   HCT 26.7 (L) 03/08/2023   MCV 72.6 (L) 03/08/2023   PLT 503 (H) 03/08/2023   NEUTROABS 7.6 03/08/2023    Imaging:  No results found.  Medications: I have reviewed the patient's current medications.  Assessment/Plan: Colon cancer  CT abdomen/pelvis 08/02/2020-small amount of free fluid in the pelvis, postsurgical change involving the left colon, trace right pleural fluid.   Colonoscopy 08/04/2020-cecal mass as well as a 3 mm polyp in the ascending colon, 9 mm polyp in the ascending colon, 3 mm polyp in the transverse colon and 4 small polyps in the transverse colon.  Biopsy of the cecal mass showed adenocarcinoma.  Pathology on the polyps showed tubular adenomas with no high-grade dysplasia or malignancy identified. CT chest 08/04/2020-no evidence of metastatic disease CEA 08/04/2020-2.0 Laparoscopic right colectomy 10/17/2020 by Dr. Nigel Mormon showed adenocarcinoma with mucinous features, moderately differentiated, 6.7 cm; no carcinoma identified and 23 lymph nodes; margins uninvolved; no macroscopic tumor  perforation identified; tumor invaded through muscularis propria into pericolorectal tissue; no lymphovascular invasion and no perineural invasion, pT3, pN0, mismatch repair protein IHC normal, MSI stable.   06/18/2022-CT abdomen/pelvis-homogenous soft tissue mass in the anterior abdomen deep to the umbilicus, 5 mm left lower lobe nodule, new from October 2021 11/04/2022 biopsy of abdominal mass-metastatic mucinous adenocarcinoma, mismatch repair protein IHC normal, foundation 1 pending CTs 11/18/2022-large mass central lower abdomen is increased in size.  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  No evidence of obstruction.  Irregular lesions right lobe of the liver likely new, compatible with metastatic disease.  Increase size of a solid left lower lobe pulmonary nodule likely due to metastatic disease.  Solid pulmonary nodule left upper lobe increased in size likely due to indolent primary lung malignancy. Foundation 1 testing (date of collection 10/17/2020, specimen received 11/24/2022)-microsatellite status is equivocal; tumor mutation burden 6; K-ras wild-type; NRAS G12S; BRAF G466A Cycle 1 FOLFIRI 12/07/2022 Chemotherapy held 12/21/2022 due to neutropenia Chemotherapy held 12/28/2022 per patient due to upcoming dental extractions Cycle 2 FOLFIRI 01/11/2023, irinotecan dose reduced secondary to diarrhea neutropenia, Udenyca Cycle 3 FOLFIRI 01/25/2023 Cycle 4 FOLFIRI 02/08/2023 Cycle 5 FOLFIRI 02/22/2023 CTs 02/24/2023-progression of liver metastases; similar to minimal enlargement of dominant anterior abdominopelvic wall mass with 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, new trace perihepatic ascites Iron deficiency anemia October 2021  08/02/2020 hemoglobin 3.8, MCV 56, ferritin 3, stool positive for occult blood 10/19/2020 hemoglobin 7.3, MCV 74 11/04/2022 hemoglobin 4.5 11/05/2022 ferritin 3 Ferrous  sulfate 325  mg twice daily recommended 11/06/2022 2 units packed red blood cells 11/17/2022 stool cards positive for blood Stage IIIc (T3 N2) moderately differentiated 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 He completed cycle 1 CAPOX beginning 11/13/2011; cycle 7 beginning 03/23/2012. Cycle 8 was completed beginning on 04/13/2012 without oxaliplatin. Restaging CT scans 06/06/2012 without evidence of recurrent disease.   Family history-brother with colon cancer Multiple polyps on colonoscopy 08/04/2020  Port-A-Cath placement 12/04/2022, Interventional Radiology    Disposition: Mr. Mahfouz appears unchanged.  He has completed 5 cycles of FOLFIRI.  Recent CTs show progression of liver metastases.  Results/images reviewed with Mr. Vanderham and his family.  Dr. Truett Perna recommends discontinuation of FOLFIRI and beginning treatment with FOLFOX.  He has had oxaliplatin in the past.  He understands the potential for an allergic reaction when retreated with oxaliplatin.  We also discussed the various forms of neuropathy.  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  He agrees to proceed with FOLFOX.  He will return for cycle 1 in 1 week.  We reviewed the CBC and chemistry panel from today.  He has stable anemia.  He does not feel he needs a blood transfusion.  Potassium remains low.  He is not taking the supplement as prescribed.  He will try to take as prescribed.  We will see him in follow-up prior to cycle 2 FOLFOX in 3 weeks.  We are available to see him sooner if needed.  Patient seen with Dr. Truett Perna.   Lonna Cobb ANP/GNP-BC   03/08/2023  11:06 AM  This was a shared visit with Lonna Cobb.  Mr. Zelenak was interviewed and examined.  We reviewed the restaging CT findings and images with Mr. Miro and his family.  There is radiologic evidence of disease progression.  We discussed treatment  options with Mr. Petitte.  FOLFIRI will be discontinued.  He received oxaliplatin for 7 cycles in the adjuvant setting 2013.  I recommend treatment with FOLFOX.  He is not a candidate for an EGFR inhibitor due to the NRAS alteration.  He understands the risk of progressive neuropathy and an allergic reaction with further oxaliplatin therapy.  The plan is to begin FOLFOX 13 2024.  A chemotherapy plan was entered today.  I was present for greater than 50% of today's visit.  I performed medical decision making.  Mancel Bale, MD

## 2023-03-08 NOTE — Progress Notes (Signed)
Patient seen by Lonna Cobb NP today  Vitals are within treatment parameters.  Labs reviewed by Lonna Cobb NP and are not all within treatment parameters. HgB 7.6  Per physician team, patient will not be receiving treatment today.

## 2023-03-08 NOTE — Patient Instructions (Signed)

## 2023-03-10 ENCOUNTER — Inpatient Hospital Stay: Payer: Self-pay

## 2023-03-10 ENCOUNTER — Encounter: Payer: Self-pay | Admitting: Oncology

## 2023-03-15 ENCOUNTER — Other Ambulatory Visit: Payer: Self-pay | Admitting: *Deleted

## 2023-03-15 ENCOUNTER — Inpatient Hospital Stay: Payer: Self-pay

## 2023-03-15 ENCOUNTER — Encounter: Payer: Self-pay | Admitting: Oncology

## 2023-03-15 ENCOUNTER — Other Ambulatory Visit: Payer: Self-pay

## 2023-03-15 VITALS — BP 188/89 | HR 67 | Temp 98.5°F | Resp 18 | Ht 67.0 in | Wt 159.4 lb

## 2023-03-15 DIAGNOSIS — C182 Malignant neoplasm of ascending colon: Secondary | ICD-10-CM

## 2023-03-15 LAB — CBC WITH DIFFERENTIAL (CANCER CENTER ONLY)
Abs Immature Granulocytes: 0.04 10*3/uL (ref 0.00–0.07)
Basophils Absolute: 0.1 10*3/uL (ref 0.0–0.1)
Basophils Relative: 1 %
Eosinophils Absolute: 0.1 10*3/uL (ref 0.0–0.5)
Eosinophils Relative: 1 %
HCT: 26.1 % — ABNORMAL LOW (ref 39.0–52.0)
Hemoglobin: 7.4 g/dL — ABNORMAL LOW (ref 13.0–17.0)
Immature Granulocytes: 0 %
Lymphocytes Relative: 13 %
Lymphs Abs: 1.1 10*3/uL (ref 0.7–4.0)
MCH: 20.8 pg — ABNORMAL LOW (ref 26.0–34.0)
MCHC: 28.4 g/dL — ABNORMAL LOW (ref 30.0–36.0)
MCV: 73.5 fL — ABNORMAL LOW (ref 80.0–100.0)
Monocytes Absolute: 0.9 10*3/uL (ref 0.1–1.0)
Monocytes Relative: 9 %
Neutro Abs: 6.8 10*3/uL (ref 1.7–7.7)
Neutrophils Relative %: 76 %
Platelet Count: 499 10*3/uL — ABNORMAL HIGH (ref 150–400)
RBC: 3.55 MIL/uL — ABNORMAL LOW (ref 4.22–5.81)
RDW: 24.8 % — ABNORMAL HIGH (ref 11.5–15.5)
WBC Count: 9 10*3/uL (ref 4.0–10.5)
nRBC: 0 % (ref 0.0–0.2)

## 2023-03-15 LAB — CMP (CANCER CENTER ONLY)
ALT: 5 U/L (ref 0–44)
AST: 10 U/L — ABNORMAL LOW (ref 15–41)
Albumin: 3.1 g/dL — ABNORMAL LOW (ref 3.5–5.0)
Alkaline Phosphatase: 91 U/L (ref 38–126)
Anion gap: 6 (ref 5–15)
BUN: 13 mg/dL (ref 8–23)
CO2: 30 mmol/L (ref 22–32)
Calcium: 7.9 mg/dL — ABNORMAL LOW (ref 8.9–10.3)
Chloride: 104 mmol/L (ref 98–111)
Creatinine: 0.73 mg/dL (ref 0.61–1.24)
GFR, Estimated: >60 mL/min (ref >60)
Glucose, Bld: 85 mg/dL (ref 70–99)
Potassium: 3.1 mmol/L — ABNORMAL LOW (ref 3.5–5.1)
Sodium: 140 mmol/L (ref 135–145)
Total Bilirubin: 0.3 mg/dL (ref 0.3–1.2)
Total Protein: 5.7 g/dL — ABNORMAL LOW (ref 6.5–8.1)

## 2023-03-15 MED ORDER — POTASSIUM CHLORIDE CRYS ER 20 MEQ PO TBCR
EXTENDED_RELEASE_TABLET | ORAL | 2 refills | Status: DC
Start: 2023-03-15 — End: 2023-06-24
  Filled 2023-03-15 – 2023-04-08 (×2): qty 30, 27d supply, fill #0

## 2023-03-15 MED ORDER — LEUCOVORIN CALCIUM INJECTION 350 MG
400.0000 mg/m2 | Freq: Once | INTRAVENOUS | Status: AC
  Administered 2023-03-15: 732 mg via INTRAVENOUS
  Filled 2023-03-15: qty 36.6

## 2023-03-15 MED ORDER — FAMOTIDINE IN NACL 20-0.9 MG/50ML-% IV SOLN
20.0000 mg | Freq: Once | INTRAVENOUS | Status: AC
  Administered 2023-03-15: 20 mg via INTRAVENOUS
  Filled 2023-03-15: qty 50

## 2023-03-15 MED ORDER — FLUOROURACIL CHEMO INJECTION 2.5 GM/50ML
400.0000 mg/m2 | Freq: Once | INTRAVENOUS | Status: AC
  Administered 2023-03-15: 750 mg via INTRAVENOUS
  Filled 2023-03-15: qty 15

## 2023-03-15 MED ORDER — DIPHENHYDRAMINE HCL 25 MG PO CAPS
50.0000 mg | ORAL_CAPSULE | Freq: Once | ORAL | Status: AC
  Administered 2023-03-15: 50 mg via ORAL
  Filled 2023-03-15: qty 2

## 2023-03-15 MED ORDER — SODIUM CHLORIDE 0.9 % IV SOLN
2000.0000 mg/m2 | INTRAVENOUS | Status: DC
  Administered 2023-03-15: 3500 mg via INTRAVENOUS
  Filled 2023-03-15: qty 70

## 2023-03-15 MED ORDER — SODIUM CHLORIDE 0.9 % IV SOLN
10.0000 mg | Freq: Once | INTRAVENOUS | Status: AC
  Administered 2023-03-15: 10 mg via INTRAVENOUS
  Filled 2023-03-15: qty 10

## 2023-03-15 MED ORDER — PALONOSETRON HCL INJECTION 0.25 MG/5ML
0.2500 mg | Freq: Once | INTRAVENOUS | Status: AC
  Administered 2023-03-15: 0.25 mg via INTRAVENOUS
  Filled 2023-03-15: qty 5

## 2023-03-15 MED ORDER — DEXTROSE 5 % IV SOLN: Freq: Once | INTRAVENOUS | Status: AC

## 2023-03-15 MED ORDER — OXALIPLATIN CHEMO INJECTION 100 MG/20ML
85.0000 mg/m2 | Freq: Once | INTRAVENOUS | Status: AC
  Administered 2023-03-15: 150 mg via INTRAVENOUS
  Filled 2023-03-15: qty 10

## 2023-03-15 NOTE — Telephone Encounter (Signed)
NP instructed patient to take his K+ bid x 3 days, then resume daily. Sent new script to his pharmacy. He also reports he is taking his ferrous sulfate bid and declines blood transfusion for Hgb 7.4.

## 2023-03-15 NOTE — Progress Notes (Signed)
Per Lonna Cobb, NP - decrease 5FU pump dose basis to 2000 mg/m2.  Ebony Hail, Pharm.D., CPP 03/15/2023@12 :27 PM

## 2023-03-15 NOTE — Patient Instructions (Signed)
[facility]   Discharge Instructions: Take KLOR-CON M 20 meq tablet twice a day for 3 days, then resume 1 tab a day.  Thank you for choosing [facility] to provide your oncology and hematology care.   If you have a lab appointment with the Cancer Center, please go directly to the Cancer Center and check in at the registration area.   Wear comfortable clothing and clothing appropriate for easy access to any Portacath or PICC line.   We strive to give you quality time with your provider. You may need to reschedule your appointment if you arrive late (15 or more minutes).  Arriving late affects you and other patients whose appointments are after yours.  Also, if you miss three or more appointments without notifying the office, you may be dismissed from the clinic at the provider's discretion.      For prescription refill requests, have your pharmacy contact our office and allow 72 hours for refills to be completed.    Today you received the following chemotherapy and/or immunotherapy agents Oxaliplatin (ELOXATIN), Leucovorin, Flourouracil (ADRUCIL).       To help prevent nausea and vomiting after your treatment, we encourage you to take your nausea medication as directed.  BELOW ARE SYMPTOMS THAT SHOULD BE REPORTED IMMEDIATELY: *FEVER GREATER THAN 100.4 F (38 C) OR HIGHER *CHILLS OR SWEATING *NAUSEA AND VOMITING THAT IS NOT CONTROLLED WITH YOUR NAUSEA MEDICATION *UNUSUAL SHORTNESS OF BREATH *UNUSUAL BRUISING OR BLEEDING *URINARY PROBLEMS (pain or burning when urinating, or frequent urination) *BOWEL PROBLEMS (unusual diarrhea, constipation, pain near the anus) TENDERNESS IN MOUTH AND THROAT WITH OR WITHOUT PRESENCE OF ULCERS (sore throat, sores in mouth, or a toothache) UNUSUAL RASH, SWELLING OR PAIN  UNUSUAL VAGINAL DISCHARGE OR ITCHING   Items with * indicate a potential emergency and should be followed up as soon as possible or go to the  Emergency Department if any problems should occur.  Please show the CHEMOTHERAPY ALERT CARD or IMMUNOTHERAPY ALERT CARD at check-in to the Emergency Department and triage nurse.  Should you have questions after your visit or need to cancel or reschedule your appointment, please contact [facility]  Dept: [phone]  and follow the prompts.  Office hours are 8:00 a.m. to 4:30 p.m. Monday - Friday. Please note that voicemails left after 4:00 p.m. may not be returned until the following business day.  We are closed weekends and major holidays. You have access to a nurse at all times for urgent questions. Please call the main number to the clinic Dept: [phone] and follow the prompts.   For any non-urgent questions, you may also contact your provider using MyChart. We now offer e-Visits for anyone 72 and older to request care online for non-urgent symptoms. For details visit [email or web address].   Also download the MyChart app! Go to the app store, search "MyChart", open the app, select [place], and log in with your MyChart username and password.  Oxaliplatin Injection What is this medication? OXALIPLATIN (ox AL i PLA tin) treats colorectal cancer. It works by slowing down the growth of cancer cells. This medicine may be used for other purposes; ask your health care provider or pharmacist if you have questions. COMMON BRAND NAME(S): Eloxatin What should I tell my care team before I take this medication? They need to know if you have any of these conditions: Heart disease History of irregular heartbeat or rhythm Liver disease Low blood  cell levels (white cells, red cells, and platelets) Lung or breathing disease, such as asthma Take medications that treat or prevent blood clots Tingling of the fingers, toes, or other nerve disorder An unusual or allergic reaction to oxaliplatin, other medications, foods, dyes, or preservatives If you or your  partner are pregnant or trying to get pregnant Breast-feeding How should I use this medication? This medication is injected into a vein. It is given by your care team in a hospital or clinic setting. Talk to your care team about the use of this medication in children. Special care may be needed. Overdosage: If you think you have taken too much of this medicine contact a poison control center or emergency room at once. NOTE: This medicine is only for you. Do not share this medicine with others. What if I miss a dose? Keep appointments for follow-up doses. It is important not to miss a dose. Call your care team if you are unable to keep an appointment. What may interact with this medication? Do not take this medication with any of the following: Cisapride Dronedarone Pimozide Thioridazine This medication may also interact with the following: Aspirin and aspirin-like medications Certain medications that treat or prevent blood clots, such as warfarin, apixaban, dabigatran, and rivaroxaban Cisplatin Cyclosporine Diuretics Medications for infection, such as acyclovir, adefovir, amphotericin B, bacitracin, cidofovir, foscarnet, ganciclovir, gentamicin, pentamidine, vancomycin NSAIDs, medications for pain and inflammation, such as ibuprofen or naproxen Other medications that cause heart rhythm changes Pamidronate Zoledronic acid This list may not describe all possible interactions. Give your health care provider a list of all the medicines, herbs, non-prescription drugs, or dietary supplements you use. Also tell them if you smoke, drink alcohol, or use illegal drugs. Some items may interact with your medicine. What should I watch for while using this medication? Your condition will be monitored carefully while you are receiving this medication. You may need blood work while taking this medication. This medication may make you feel generally unwell. This is not uncommon as chemotherapy can  affect healthy cells as well as cancer cells. Report any side effects. Continue your course of treatment even though you feel ill unless your care team tells you to stop. This medication may increase your risk of getting an infection. Call your care team for advice if you get a fever, chills, sore throat, or other symptoms of a cold or flu. Do not treat yourself. Try to avoid being around people who are sick. Avoid taking medications that contain aspirin, acetaminophen, ibuprofen, naproxen, or ketoprofen unless instructed by your care team. These medications may hide a fever. Be careful brushing or flossing your teeth or using a toothpick because you may get an infection or bleed more easily. If you have any dental work done, tell your dentist you are receiving this medication. This medication can make you more sensitive to cold. Do not drink cold drinks or use ice. Cover exposed skin before coming in contact with cold temperatures or cold objects. When out in cold weather wear warm clothing and cover your mouth and nose to warm the air that goes into your lungs. Tell your care team if you get sensitive to the cold. Talk to your care team if you or your partner are pregnant or think either of you might be pregnant. This medication can cause serious birth defects if taken during pregnancy and for 9 months after the last dose. A negative pregnancy test is required before starting this medication. A reliable form of  contraception is recommended while taking this medication and for 9 months after the last dose. Talk to your care team about effective forms of contraception. Do not father a child while taking this medication and for 6 months after the last dose. Use a condom while having sex during this time period. Do not breastfeed while taking this medication and for 3 months after the last dose. This medication may cause infertility. Talk to your care team if you are concerned about your fertility. What side  effects may I notice from receiving this medication? Side effects that you should report to your care team as soon as possible: Allergic reactions--skin rash, itching, hives, swelling of the face, lips, tongue, or throat Bleeding--bloody or black, tar-like stools, vomiting blood or brown material that looks like coffee grounds, red or dark brown urine, small red or purple spots on skin, unusual bruising or bleeding Dry cough, shortness of breath or trouble breathing Heart rhythm changes--fast or irregular heartbeat, dizziness, feeling faint or lightheaded, chest pain, trouble breathing Infection--fever, chills, cough, sore throat, wounds that don't heal, pain or trouble when passing urine, general feeling of discomfort or being unwell Liver injury--right upper belly pain, loss of appetite, nausea, light-colored stool, dark yellow or brown urine, yellowing skin or eyes, unusual weakness or fatigue Low red blood cell level--unusual weakness or fatigue, dizziness, headache, trouble breathing Muscle injury--unusual weakness or fatigue, muscle pain, dark yellow or brown urine, decrease in amount of urine Pain, tingling, or numbness in the hands or feet Sudden and severe headache, confusion, change in vision, seizures, which may be signs of posterior reversible encephalopathy syndrome (PRES) Unusual bruising or bleeding Side effects that usually do not require medical attention (report to your care team if they continue or are bothersome): Diarrhea Nausea Pain, redness, or swelling with sores inside the mouth or throat Unusual weakness or fatigue Vomiting This list may not describe all possible side effects. Call your doctor for medical advice about side effects. You may report side effects to FDA at 1-800-FDA-1088. Where should I keep my medication? This medication is given in a hospital or clinic. It will not be stored at home. NOTE: This sheet is a summary. It may not cover all possible  information. If you have questions about this medicine, talk to your doctor, pharmacist, or health care provider.  2023 Elsevier/Gold Standard (2007-12-10 00:00:00)  Leucovorin Injection What is this medication? LEUCOVORIN (loo koe VOR in) prevents side effects from certain medications, such as methotrexate. It works by increasing folate levels. This helps protect healthy cells in your body. It may also be used to treat anemia caused by low levels of folate. It can also be used with fluorouracil, a type of chemotherapy, to treat colorectal cancer. It works by increasing the effects of fluorouracil in the body. This medicine may be used for other purposes; ask your health care provider or pharmacist if you have questions. What should I tell my care team before I take this medication? They need to know if you have any of these conditions: Anemia from low levels of vitamin B12 in the blood An unusual or allergic reaction to leucovorin, folic acid, other medications, foods, dyes, or preservatives Pregnant or trying to get pregnant Breastfeeding How should I use this medication? This medication is injected into a vein or a muscle. It is given by your care team in a hospital or clinic setting. Talk to your care team about the use of this medication in children. Special care may  be needed. Overdosage: If you think you have taken too much of this medicine contact a poison control center or emergency room at once. NOTE: This medicine is only for you. Do not share this medicine with others. What if I miss a dose? Keep appointments for follow-up doses. It is important not to miss your dose. Call your care team if you are unable to keep an appointment. What may interact with this medication? Capecitabine Fluorouracil Phenobarbital Phenytoin Primidone Trimethoprim;sulfamethoxazole This list may not describe all possible interactions. Give your health care provider a list of all the medicines, herbs,  non-prescription drugs, or dietary supplements you use. Also tell them if you smoke, drink alcohol, or use illegal drugs. Some items may interact with your medicine. What should I watch for while using this medication? Your condition will be monitored carefully while you are receiving this medication. This medication may increase the side effects of 5-fluorouracil. Tell your care team if you have diarrhea or mouth sores that do not get better or that get worse. What side effects may I notice from receiving this medication? Side effects that you should report to your care team as soon as possible: Allergic reactions--skin rash, itching, hives, swelling of the face, lips, tongue, or throat This list may not describe all possible side effects. Call your doctor for medical advice about side effects. You may report side effects to FDA at 1-800-FDA-1088. Where should I keep my medication? This medication is given in a hospital or clinic. It will not be stored at home. NOTE: This sheet is a summary. It may not cover all possible information. If you have questions about this medicine, talk to your doctor, pharmacist, or health care provider.  2023 Elsevier/Gold Standard (2022-02-27 00:00:00)  Fluorouracil Injection What is this medication? FLUOROURACIL (flure oh YOOR a sil) treats some types of cancer. It works by slowing down the growth of cancer cells. This medicine may be used for other purposes; ask your health care provider or pharmacist if you have questions. COMMON BRAND NAME(S): Adrucil What should I tell my care team before I take this medication? They need to know if you have any of these conditions: Blood disorders Dihydropyrimidine dehydrogenase (DPD) deficiency Infection, such as chickenpox, cold sores, herpes Kidney disease Liver disease Poor nutrition Recent or ongoing radiation therapy An unusual or allergic reaction to fluorouracil, other medications, foods, dyes, or  preservatives If you or your partner are pregnant or trying to get pregnant Breast-feeding How should I use this medication? This medication is injected into a vein. It is administered by your care team in a hospital or clinic setting. Talk to your care team about the use of this medication in children. Special care may be needed. Overdosage: If you think you have taken too much of this medicine contact a poison control center or emergency room at once. NOTE: This medicine is only for you. Do not share this medicine with others. What if I miss a dose? Keep appointments for follow-up doses. It is important not to miss your dose. Call your care team if you are unable to keep an appointment. What may interact with this medication? Do not take this medication with any of the following: Live virus vaccines This medication may also interact with the following: Medications that treat or prevent blood clots, such as warfarin, enoxaparin, dalteparin This list may not describe all possible interactions. Give your health care provider a list of all the medicines, herbs, non-prescription drugs, or dietary supplements you  use. Also tell them if you smoke, drink alcohol, or use illegal drugs. Some items may interact with your medicine. What should I watch for while using this medication? Your condition will be monitored carefully while you are receiving this medication. This medication may make you feel generally unwell. This is not uncommon as chemotherapy can affect healthy cells as well as cancer cells. Report any side effects. Continue your course of treatment even though you feel ill unless your care team tells you to stop. In some cases, you may be given additional medications to help with side effects. Follow all directions for their use. This medication may increase your risk of getting an infection. Call your care team for advice if you get a fever, chills, sore throat, or other symptoms of a cold or  flu. Do not treat yourself. Try to avoid being around people who are sick. This medication may increase your risk to bruise or bleed. Call your care team if you notice any unusual bleeding. Be careful brushing or flossing your teeth or using a toothpick because you may get an infection or bleed more easily. If you have any dental work done, tell your dentist you are receiving this medication. Avoid taking medications that contain aspirin, acetaminophen, ibuprofen, naproxen, or ketoprofen unless instructed by your care team. These medications may hide a fever. Do not treat diarrhea with over the counter products. Contact your care team if you have diarrhea that lasts more than 2 days or if it is severe and watery. This medication can make you more sensitive to the sun. Keep out of the sun. If you cannot avoid being in the sun, wear protective clothing and sunscreen. Do not use sun lamps, tanning beds, or tanning booths. Talk to your care team if you or your partner wish to become pregnant or think you might be pregnant. This medication can cause serious birth defects if taken during pregnancy and for 3 months after the last dose. A reliable form of contraception is recommended while taking this medication and for 3 months after the last dose. Talk to your care team about effective forms of contraception. Do not father a child while taking this medication and for 3 months after the last dose. Use a condom while having sex during this time period. Do not breastfeed while taking this medication. This medication may cause infertility. Talk to your care team if you are concerned about your fertility. What side effects may I notice from receiving this medication? Side effects that you should report to your care team as soon as possible: Allergic reactions--skin rash, itching, hives, swelling of the face, lips, tongue, or throat Heart attack--pain or tightness in the chest, shoulders, arms, or jaw, nausea,  shortness of breath, cold or clammy skin, feeling faint or lightheaded Heart failure--shortness of breath, swelling of the ankles, feet, or hands, sudden weight gain, unusual weakness or fatigue Heart rhythm changes--fast or irregular heartbeat, dizziness, feeling faint or lightheaded, chest pain, trouble breathing High ammonia level--unusual weakness or fatigue, confusion, loss of appetite, nausea, vomiting, seizures Infection--fever, chills, cough, sore throat, wounds that don't heal, pain or trouble when passing urine, general feeling of discomfort or being unwell Low red blood cell level--unusual weakness or fatigue, dizziness, headache, trouble breathing Pain, tingling, or numbness in the hands or feet, muscle weakness, change in vision, confusion or trouble speaking, loss of balance or coordination, trouble walking, seizures Redness, swelling, and blistering of the skin over hands and feet Severe or prolonged diarrhea  Unusual bruising or bleeding Side effects that usually do not require medical attention (report to your care team if they continue or are bothersome): Dry skin Headache Increased tears Nausea Pain, redness, or swelling with sores inside the mouth or throat Sensitivity to light Vomiting This list may not describe all possible side effects. Call your doctor for medical advice about side effects. You may report side effects to FDA at 1-800-FDA-1088. Where should I keep my medication? This medication is given in a hospital or clinic. It will not be stored at home. NOTE: This sheet is a summary. It may not cover all possible information. If you have questions about this medicine, talk to your doctor, pharmacist, or health care provider.  2023 Elsevier/Gold Standard (2022-02-17 00:00:00)  The chemotherapy medication bag should finish at 46 hours, 96 hours, or 7 days. For example, if your pump is scheduled for 46 hours and it was put on at 4:00 p.m., it should finish at 2:00  p.m. the day it is scheduled to come off regardless of your appointment time.     Estimated time to finish at 1:30 p.m. on Wednesday 03/17/2023.   If the display on your pump reads "Low Volume" and it is beeping, take the batteries out of the pump and come to the cancer center for it to be taken off.   If the pump alarms go off prior to the pump reading "Low Volume" then call [phone] and someone can assist you.  If the plunger comes out and the chemotherapy medication is leaking out, please use your home chemo spill kit to clean up the spill. Do NOT use paper towels or other household products.  If you have problems or questions regarding your pump, please call either [phone] (24 hours a day) or the cancer center Monday-Friday 8:00 a.m.- 4:30 p.m. at the clinic number and we will assist you. If you are unable to get assistance, then go to the nearest Emergency Department and ask the staff to contact the IV team for assistance.

## 2023-03-15 NOTE — Progress Notes (Signed)
Per Lisa,NP: Okay to proceed with treatment today with Hgb level of 7.4 & Potassium of 3.1

## 2023-03-16 ENCOUNTER — Telehealth: Payer: Self-pay

## 2023-03-16 NOTE — Telephone Encounter (Signed)
24 Hour Call Back  Telephone call to patient, unable to speak with him. Telephone call to patient's spouse Careers adviser) and she reported that patient is doing fine since his Folfox treatment the previous day with no concerns expressed. This Clinical research associate reminded spouse to encourage patient to take his KLOR CON twice daily for 3 days and then once daily as ordered by the physician for his low potasium and to continue taking his iron tablet as prescribed, she verbalized agreement. This Clinical research associate also informed her that per patient's request, his next treatment has been rescheduled for 04/05/2023 starting at 1000 and she was agreeable with this. Patient will be back tomorrow at 1330 for his pump stop appointment. Wife knows to call the clinic with any concerns that arises before this appointment.

## 2023-03-17 ENCOUNTER — Inpatient Hospital Stay: Payer: Self-pay

## 2023-03-17 VITALS — BP 172/81 | HR 90 | Temp 98.6°F | Resp 18

## 2023-03-17 DIAGNOSIS — C182 Malignant neoplasm of ascending colon: Secondary | ICD-10-CM

## 2023-03-17 MED ORDER — SODIUM CHLORIDE 0.9% FLUSH
10.0000 mL | INTRAVENOUS | Status: DC | PRN
  Administered 2023-03-17: 10 mL

## 2023-03-17 MED ORDER — HEPARIN SOD (PORK) LOCK FLUSH 100 UNIT/ML IV SOLN
500.0000 [IU] | Freq: Once | INTRAVENOUS | Status: AC | PRN
  Administered 2023-03-17: 500 [IU]

## 2023-03-17 MED ORDER — PEGFILGRASTIM-CBQV 6 MG/0.6ML ~~LOC~~ SOSY
6.0000 mg | PREFILLED_SYRINGE | Freq: Once | SUBCUTANEOUS | Status: AC
  Administered 2023-03-17: 6 mg via SUBCUTANEOUS
  Filled 2023-03-17: qty 0.6

## 2023-03-17 NOTE — Patient Instructions (Signed)

## 2023-03-18 ENCOUNTER — Telehealth: Payer: Self-pay | Admitting: Oncology

## 2023-03-18 ENCOUNTER — Telehealth: Payer: Self-pay

## 2023-03-18 NOTE — Telephone Encounter (Signed)
CSW attempted to contact patient per the request of RPH regarding insurance.  Left message.

## 2023-03-22 ENCOUNTER — Inpatient Hospital Stay: Payer: Self-pay

## 2023-03-22 ENCOUNTER — Other Ambulatory Visit: Payer: Self-pay

## 2023-03-22 ENCOUNTER — Inpatient Hospital Stay: Payer: Self-pay | Admitting: Oncology

## 2023-03-24 ENCOUNTER — Inpatient Hospital Stay: Payer: Self-pay

## 2023-03-31 ENCOUNTER — Inpatient Hospital Stay: Payer: Self-pay

## 2023-04-02 ENCOUNTER — Inpatient Hospital Stay: Payer: Self-pay

## 2023-04-04 ENCOUNTER — Other Ambulatory Visit: Payer: Self-pay | Admitting: Oncology

## 2023-04-05 ENCOUNTER — Inpatient Hospital Stay: Payer: Self-pay

## 2023-04-05 ENCOUNTER — Inpatient Hospital Stay: Payer: Self-pay | Admitting: Nurse Practitioner

## 2023-04-05 ENCOUNTER — Other Ambulatory Visit: Payer: Self-pay

## 2023-04-05 ENCOUNTER — Ambulatory Visit: Payer: Self-pay

## 2023-04-06 ENCOUNTER — Encounter: Payer: Self-pay | Admitting: *Deleted

## 2023-04-06 ENCOUNTER — Inpatient Hospital Stay: Payer: Self-pay | Attending: Oncology

## 2023-04-06 ENCOUNTER — Inpatient Hospital Stay: Payer: Self-pay

## 2023-04-06 ENCOUNTER — Other Ambulatory Visit: Payer: Self-pay

## 2023-04-06 ENCOUNTER — Inpatient Hospital Stay (HOSPITAL_BASED_OUTPATIENT_CLINIC_OR_DEPARTMENT_OTHER): Payer: Self-pay | Admitting: Oncology

## 2023-04-06 VITALS — BP 171/82 | HR 68 | Temp 98.2°F | Resp 20 | Ht 67.0 in | Wt 157.0 lb

## 2023-04-06 VITALS — BP 169/81 | HR 70 | Resp 18

## 2023-04-06 DIAGNOSIS — Z800 Family history of malignant neoplasm of digestive organs: Secondary | ICD-10-CM | POA: Insufficient documentation

## 2023-04-06 DIAGNOSIS — C182 Malignant neoplasm of ascending colon: Secondary | ICD-10-CM

## 2023-04-06 DIAGNOSIS — Z933 Colostomy status: Secondary | ICD-10-CM | POA: Insufficient documentation

## 2023-04-06 DIAGNOSIS — Z5111 Encounter for antineoplastic chemotherapy: Secondary | ICD-10-CM | POA: Insufficient documentation

## 2023-04-06 DIAGNOSIS — Z5189 Encounter for other specified aftercare: Secondary | ICD-10-CM | POA: Insufficient documentation

## 2023-04-06 DIAGNOSIS — D509 Iron deficiency anemia, unspecified: Secondary | ICD-10-CM | POA: Insufficient documentation

## 2023-04-06 DIAGNOSIS — C787 Secondary malignant neoplasm of liver and intrahepatic bile duct: Secondary | ICD-10-CM | POA: Insufficient documentation

## 2023-04-06 DIAGNOSIS — C180 Malignant neoplasm of cecum: Secondary | ICD-10-CM | POA: Insufficient documentation

## 2023-04-06 LAB — CMP (CANCER CENTER ONLY)
ALT: 6 U/L (ref 0–44)
AST: 12 U/L — ABNORMAL LOW (ref 15–41)
Albumin: 2.9 g/dL — ABNORMAL LOW (ref 3.5–5.0)
Alkaline Phosphatase: 87 U/L (ref 38–126)
Anion gap: 7 (ref 5–15)
BUN: 11 mg/dL (ref 8–23)
CO2: 30 mmol/L (ref 22–32)
Calcium: 8 mg/dL — ABNORMAL LOW (ref 8.9–10.3)
Chloride: 101 mmol/L (ref 98–111)
Creatinine: 0.67 mg/dL (ref 0.61–1.24)
GFR, Estimated: >60 mL/min (ref >60)
Glucose, Bld: 82 mg/dL (ref 70–99)
Potassium: 3 mmol/L — ABNORMAL LOW (ref 3.5–5.1)
Sodium: 138 mmol/L (ref 135–145)
Total Bilirubin: 0.4 mg/dL (ref 0.3–1.2)
Total Protein: 5.6 g/dL — ABNORMAL LOW (ref 6.5–8.1)

## 2023-04-06 LAB — CBC WITH DIFFERENTIAL (CANCER CENTER ONLY)
Abs Immature Granulocytes: 0.02 10*3/uL (ref 0.00–0.07)
Basophils Absolute: 0.1 10*3/uL (ref 0.0–0.1)
Basophils Relative: 1 %
Eosinophils Absolute: 0.1 10*3/uL (ref 0.0–0.5)
Eosinophils Relative: 2 %
HCT: 26.3 % — ABNORMAL LOW (ref 39.0–52.0)
Hemoglobin: 7.5 g/dL — ABNORMAL LOW (ref 13.0–17.0)
Immature Granulocytes: 0 %
Lymphocytes Relative: 20 %
Lymphs Abs: 1.2 10*3/uL (ref 0.7–4.0)
MCH: 20.4 pg — ABNORMAL LOW (ref 26.0–34.0)
MCHC: 28.5 g/dL — ABNORMAL LOW (ref 30.0–36.0)
MCV: 71.5 fL — ABNORMAL LOW (ref 80.0–100.0)
Monocytes Absolute: 0.7 10*3/uL (ref 0.1–1.0)
Monocytes Relative: 11 %
Neutro Abs: 3.8 10*3/uL (ref 1.7–7.7)
Neutrophils Relative %: 66 %
Platelet Count: 358 10*3/uL (ref 150–400)
RBC: 3.68 MIL/uL — ABNORMAL LOW (ref 4.22–5.81)
RDW: 23.1 % — ABNORMAL HIGH (ref 11.5–15.5)
WBC Count: 5.8 10*3/uL (ref 4.0–10.5)
nRBC: 0 % (ref 0.0–0.2)

## 2023-04-06 MED ORDER — SODIUM CHLORIDE 0.9 % IV SOLN
2000.0000 mg/m2 | INTRAVENOUS | Status: DC
  Administered 2023-04-06: 3500 mg via INTRAVENOUS
  Filled 2023-04-06: qty 70

## 2023-04-06 MED ORDER — FAMOTIDINE IN NACL 20-0.9 MG/50ML-% IV SOLN
20.0000 mg | Freq: Once | INTRAVENOUS | Status: AC
  Administered 2023-04-06: 20 mg via INTRAVENOUS
  Filled 2023-04-06: qty 50

## 2023-04-06 MED ORDER — SODIUM CHLORIDE 0.9 % IV SOLN
10.0000 mg | Freq: Once | INTRAVENOUS | Status: AC
  Administered 2023-04-06: 10 mg via INTRAVENOUS
  Filled 2023-04-06: qty 10

## 2023-04-06 MED ORDER — LEUCOVORIN CALCIUM INJECTION 350 MG
400.0000 mg/m2 | Freq: Once | INTRAVENOUS | Status: AC
  Administered 2023-04-06: 732 mg via INTRAVENOUS
  Filled 2023-04-06: qty 36.6

## 2023-04-06 MED ORDER — DIPHENHYDRAMINE HCL 25 MG PO CAPS
50.0000 mg | ORAL_CAPSULE | Freq: Once | ORAL | Status: AC
  Administered 2023-04-06: 50 mg via ORAL
  Filled 2023-04-06: qty 2

## 2023-04-06 MED ORDER — DEXTROSE 5 % IV SOLN: Freq: Once | INTRAVENOUS | Status: AC

## 2023-04-06 MED ORDER — OXALIPLATIN CHEMO INJECTION 100 MG/20ML
85.0000 mg/m2 | Freq: Once | INTRAVENOUS | Status: AC
  Administered 2023-04-06: 150 mg via INTRAVENOUS
  Filled 2023-04-06: qty 20

## 2023-04-06 MED ORDER — FLUOROURACIL CHEMO INJECTION 2.5 GM/50ML
400.0000 mg/m2 | Freq: Once | INTRAVENOUS | Status: AC
  Administered 2023-04-06: 750 mg via INTRAVENOUS
  Filled 2023-04-06: qty 15

## 2023-04-06 MED ORDER — PALONOSETRON HCL INJECTION 0.25 MG/5ML
0.2500 mg | Freq: Once | INTRAVENOUS | Status: AC
  Administered 2023-04-06: 0.25 mg via INTRAVENOUS
  Filled 2023-04-06: qty 5

## 2023-04-06 NOTE — Progress Notes (Signed)
Patient declines getting KCL IV today. States that he will take his potasium tablets.  Per Dr. Truett Perna: Dr. Truett Perna wants him to take #2 K+ tabs today then #1 daily.

## 2023-04-06 NOTE — Progress Notes (Signed)
Aaron Roth OFFICE PROGRESS NOTE   Diagnosis: Colon cancer  INTERVAL HISTORY:   Aaron Roth completed cycle of FOLFOX on 03/15/2023.  No nausea/vomiting, mouth sores, diarrhea, or symptom of allergic reaction.  He had cold sensitivity following chemotherapy.  No neuropathy symptoms today.  No dyspnea.  He reports abdominal pain last week.  This resolved yesterday.  Objective:  Vital signs in last 24 hours:  Blood pressure (!) 171/82, pulse 68, temperature 98.2 F (36.8 C), temperature source Oral, resp. rate 20, height 5\' 7"  (1.702 m), weight 157 lb (71.2 kg), SpO2 100 %.    HEENT: No thrush or ulcers, white coat over the tongue Resp: Lungs clear bilaterally Cardio: Regular rate and rhythm GI: Mid abdominal mass is mobile.  No hepatomegaly, no apparent ascites Vascular: 1+ pitting edema at the lower leg bilaterally.  No erythema  Skin: Mild hyperpigmentation of the hands  Portacath/PICC-without erythema  Lab Results:  Lab Results  Component Value Date   WBC 5.8 04/06/2023   HGB 7.5 (L) 04/06/2023   HCT 26.3 (L) 04/06/2023   MCV 71.5 (L) 04/06/2023   PLT 358 04/06/2023   NEUTROABS 3.8 04/06/2023    CMP  Lab Results  Component Value Date   NA 140 03/15/2023   K 3.1 (L) 03/15/2023   CL 104 03/15/2023   CO2 30 03/15/2023   GLUCOSE 85 03/15/2023   BUN 13 03/15/2023   CREATININE 0.73 03/15/2023   CALCIUM 7.9 (L) 03/15/2023   PROT 5.7 (L) 03/15/2023   ALBUMIN 3.1 (L) 03/15/2023   AST 10 (L) 03/15/2023   ALT 5 03/15/2023   ALKPHOS 91 03/15/2023   BILITOT 0.3 03/15/2023   GFRNONAA >60 03/15/2023   GFRAA 80 10/09/2020    Lab Results  Component Value Date   CEA1 2.0 08/04/2020   CEA 8.93 (H) 03/08/2023   Medications: I have reviewed the patient's current medications.   Assessment/Plan: Colon cancer  CT abdomen/pelvis 08/02/2020-small amount of free fluid in the pelvis, postsurgical change involving the left colon, trace right pleural fluid.    Colonoscopy 08/04/2020-cecal mass as well as a 3 mm polyp in the ascending colon, 9 mm polyp in the ascending colon, 3 mm polyp in the transverse colon and 4 small polyps in the transverse colon.  Biopsy of the cecal mass showed adenocarcinoma.  Pathology on the polyps showed tubular adenomas with no high-grade dysplasia or malignancy identified. CT chest 08/04/2020-no evidence of metastatic disease CEA 08/04/2020-2.0 Laparoscopic right colectomy 10/17/2020 by Dr. Nigel Mormon showed adenocarcinoma with mucinous features, moderately differentiated, 6.7 cm; no carcinoma identified and 23 lymph nodes; margins uninvolved; no macroscopic tumor perforation identified; tumor invaded through muscularis propria into pericolorectal tissue; no lymphovascular invasion and no perineural invasion, pT3, pN0, mismatch repair protein IHC normal, MSI stable.   06/18/2022-CT abdomen/pelvis-homogenous soft tissue mass in the anterior abdomen deep to the umbilicus, 5 mm left lower lobe nodule, new from October 2021 11/04/2022 biopsy of abdominal mass-metastatic mucinous adenocarcinoma, mismatch repair protein IHC normal, foundation 1 pending CTs 11/18/2022-large mass central lower abdomen is increased in size.  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  No evidence of obstruction.  Irregular lesions right lobe of the liver likely new, compatible with metastatic disease.  Increase size of a solid left lower lobe pulmonary nodule likely due to metastatic disease.  Solid pulmonary nodule left upper lobe increased in size likely due  to indolent primary lung malignancy. Foundation 1 testing (date of collection 10/17/2020, specimen received 11/24/2022)-microsatellite status is equivocal; tumor mutation burden 6; K-ras wild-type; NRAS G12S; BRAF [id number] Cycle 1 FOLFIRI 12/07/2022 Chemotherapy held 12/21/2022 due to neutropenia Chemotherapy  held 12/28/2022 per patient due to upcoming dental extractions Cycle 2 FOLFIRI 01/11/2023, irinotecan dose reduced secondary to diarrhea neutropenia, Udenyca Cycle 3 FOLFIRI 01/25/2023 Cycle 4 FOLFIRI 02/08/2023 Cycle 5 FOLFIRI 02/22/2023 CTs 02/24/2023-progression of liver metastases; similar to minimal enlargement of dominant anterior abdominopelvic wall mass with 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, new trace perihepatic ascites Cycle 1 FOLFOX 03/15/2023 Cycle 2 FOLFOX 04/06/2023 Iron deficiency anemia October 2021  08/02/2020 hemoglobin 3.8, MCV 56, ferritin 3, stool positive for occult blood 10/19/2020 hemoglobin 7.3, MCV 74 11/04/2022 hemoglobin 4.5 11/05/2022 ferritin 3 Ferrous sulfate 325 mg twice daily recommended 11/06/2022 2 units packed red blood cells 11/17/2022 stool cards positive for blood 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 He completed cycle 1 CAPOX beginning 11/13/2011; cycle 7 beginning 03/23/2012. Cycle 8 was completed beginning on 04/13/2012 without oxaliplatin. Restaging CT scans 06/06/2012 without evidence of recurrent disease.   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  He completed 1 cycle of salvage therapy with FOLFOX on 03/15/2023.  He tolerated treatment well.  He will complete cycle 2 today.  He understands the potential for an allergic reaction.  He agrees to proceed.  He will receive Pepcid and Benadryl premedication.  He will return for an office visit and the neck cycle chemotherapy in 2 weeks.  He will call for recurrent abdominal pain.  I encouraged him to take ferrous sulfate for the iron deficiency anemia.  Thornton Papas, MD  04/06/2023  8:08 AM

## 2023-04-06 NOTE — Progress Notes (Signed)
Patient seen by Dr. Truett Perna today  Vitals are within treatment parameters.No intervention for BP 171/82--not consistently taking BP meds  Labs reviewed by Dr. Truett Perna and are not all within treatment parameters. K+ 3.0 (has not been taking consistently) and Hgb 7.5 (not consistently taking his iron and declines transfusion---OK to proceed  Per physician team, patient is ready for treatment. Please note that modifications are being made to the treatment plan including KCl 20 meq IV today

## 2023-04-06 NOTE — Patient Instructions (Signed)
Hypokalemia--Take #2 of your potassium pills today and then #1 daily indefinitely. Hypokalemia means that the amount of potassium in the blood is lower than normal. Potassium is a mineral (electrolyte) that helps regulate the amount of fluid in the body. It also stimulates muscle tightening (contraction) and helps nerves work properly. Normally, most of the body's potassium is inside cells, and only a very small amount is in the blood. Because the amount in the blood is so small, minor changes to potassium levels in the blood can be life-threatening. What are the causes? This condition may be caused by: Antibiotic medicine. Diarrhea or vomiting. Taking too much of a medicine that helps you have a bowel movement (laxative) can cause diarrhea and lead to hypokalemia. Chronic kidney disease (CKD). Medicines that help the body get rid of excess fluid (diuretics). Eating disorders, such as anorexia or bulimia. Low magnesium levels in the body. Sweating a lot. What are the signs or symptoms? Symptoms of this condition include: Weakness. Constipation. Fatigue. Muscle cramps. Mental confusion. Skipped heartbeats or irregular heartbeat (palpitations). Tingling or numbness. How is this diagnosed? This condition is diagnosed with a blood test. How is this treated? This condition may be treated by: Taking potassium supplements. Adjusting the medicines that you take. Eating more foods that contain a lot of potassium. If your potassium level is very low, you may need to get potassium through an IV and be monitored in the hospital. Follow these instructions at home: Eating and drinking  Eat a healthy diet. A healthy diet includes fresh fruits and vegetables, whole grains, healthy fats, and lean proteins. If told, eat more foods that contain a lot of potassium. These include: Nuts, such as peanuts and pistachios. Seeds, such as sunflower seeds and pumpkin seeds. Peas, lentils, and lima  beans. Whole grain and bran cereals and breads. Fresh fruits and vegetables, such as apricots, avocado, bananas, cantaloupe, kiwi, oranges, tomatoes, asparagus, and potatoes. Juices, such as orange, tomato, and prune. Lean meats, including fish. Milk and milk products, such as yogurt. General instructions Take over-the-counter and prescription medicines only as told by your health care provider. This includes vitamins, natural food products, and supplements. Keep all follow-up visits. This is important. Contact a health care provider if: You have weakness that gets worse. You feel your heart pounding or racing. You vomit. You have diarrhea. You have diabetes and you have trouble keeping your blood sugar in your target range. Get help right away if: You have chest pain. You have shortness of breath. You have vomiting or diarrhea that lasts for more than 2 days. You faint. These symptoms may be an emergency. Get help right away. Call 911. Do not wait to see if the symptoms will go away. Do not drive yourself to the hospital. Summary Hypokalemia means that the amount of potassium in the blood is lower than normal. This condition is diagnosed with a blood test. Hypokalemia may be treated by taking potassium supplements, adjusting the medicines that you take, or eating more foods that are high in potassium. If your potassium level is very low, you may need to get potassium through an IV and be monitored in the hospital. This information is not intended to replace advice given to you by your health care provider. Make sure you discuss any questions you have with your health care provider. Document Revised: 07/03/2021 Document Reviewed: 07/03/2021 Elsevier Patient Education  2024 Elsevier Inc.   [facility] AT [facility]   Discharge Instructions: Thank  you for choosing [facility] to provide your oncology and hematology care.   If you have a lab  appointment with the Cancer Center, please go directly to the Cancer Center and check in at the registration area.   Wear comfortable clothing and clothing appropriate for easy access to any Portacath or PICC line.   We strive to give you quality time with your provider. You may need to reschedule your appointment if you arrive late (15 or more minutes).  Arriving late affects you and other patients whose appointments are after yours.  Also, if you miss three or more appointments without notifying the office, you may be dismissed from the clinic at the provider's discretion.      For prescription refill requests, have your pharmacy contact our office and allow 72 hours for refills to be completed.    Today you received the following chemotherapy and/or immunotherapy agents Oxaliplatin (ELOXATIN), Leucovorin & Flourouracil (ADRUCIL).      To help prevent nausea and vomiting after your treatment, we encourage you to take your nausea medication as directed.  BELOW ARE SYMPTOMS THAT SHOULD BE REPORTED IMMEDIATELY: *FEVER GREATER THAN 100.4 F (38 C) OR HIGHER *CHILLS OR SWEATING *NAUSEA AND VOMITING THAT IS NOT CONTROLLED WITH YOUR NAUSEA MEDICATION *UNUSUAL SHORTNESS OF BREATH *UNUSUAL BRUISING OR BLEEDING *URINARY PROBLEMS (pain or burning when urinating, or frequent urination) *BOWEL PROBLEMS (unusual diarrhea, constipation, pain near the anus) TENDERNESS IN MOUTH AND THROAT WITH OR WITHOUT PRESENCE OF ULCERS (sore throat, sores in mouth, or a toothache) UNUSUAL RASH, SWELLING OR PAIN  UNUSUAL VAGINAL DISCHARGE OR ITCHING   Items with * indicate a potential emergency and should be followed up as soon as possible or go to the Emergency Department if any problems should occur.  Please show the CHEMOTHERAPY ALERT CARD or IMMUNOTHERAPY ALERT CARD at check-in to the Emergency Department and triage nurse.  Should you have questions after your visit or need to cancel or reschedule your  appointment, please contact [place] [facility]  Dept: [phone]  and follow the prompts.  Office hours are 8:00 a.m. to 4:30 p.m. Monday - Friday. Please note that voicemails left after 4:00 p.m. may not be returned until the following business day.  We are closed weekends and major holidays. You have access to a nurse at all times for urgent questions. Please call the main number to the clinic Dept: [phone] and follow the prompts.   For any non-urgent questions, you may also contact your provider using MyChart. We now offer e-Visits for anyone 26 and older to request care online for non-urgent symptoms. For details visit [email or web address].   Also download the MyChart app! Go to the app store, search "MyChart", open the app, select , and log in with your MyChart username and password.  Oxaliplatin Injection What is this medication? OXALIPLATIN (ox AL i PLA tin) treats colorectal cancer. It works by slowing down the growth of cancer cells. This medicine may be used for other purposes; ask your health care provider or pharmacist if you have questions. COMMON BRAND NAME(S): Eloxatin What should I tell my care team before I take this medication? They need to know if you have any of these conditions: Heart disease History of irregular heartbeat or rhythm Liver disease Low blood cell levels (white cells, red cells, and platelets) Lung or breathing disease, such as asthma Take medications that treat or prevent blood clots Tingling of the fingers, toes, or other  nerve disorder An unusual or allergic reaction to oxaliplatin, other medications, foods, dyes, or preservatives If you or your partner are pregnant or trying to get pregnant Breast-feeding How should I use this medication? This medication is injected into a vein. It is given by your care team in a hospital or clinic setting. Talk to your care team about the use of this medication in  children. Special care may be needed. Overdosage: If you think you have taken too much of this medicine contact a poison control center or emergency room at once. NOTE: This medicine is only for you. Do not share this medicine with others. What if I miss a dose? Keep appointments for follow-up doses. It is important not to miss a dose. Call your care team if you are unable to keep an appointment. What may interact with this medication? Do not take this medication with any of the following: Cisapride Dronedarone Pimozide Thioridazine This medication may also interact with the following: Aspirin and aspirin-like medications Certain medications that treat or prevent blood clots, such as warfarin, apixaban, dabigatran, and rivaroxaban Cisplatin Cyclosporine Diuretics Medications for infection, such as acyclovir, adefovir, amphotericin B, bacitracin, cidofovir, foscarnet, ganciclovir, gentamicin, pentamidine, vancomycin NSAIDs, medications for pain and inflammation, such as ibuprofen or naproxen Other medications that cause heart rhythm changes Pamidronate Zoledronic acid This list may not describe all possible interactions. Give your health care provider a list of all the medicines, herbs, non-prescription drugs, or dietary supplements you use. Also tell them if you smoke, drink alcohol, or use illegal drugs. Some items may interact with your medicine. What should I watch for while using this medication? Your condition will be monitored carefully while you are receiving this medication. You may need blood work while taking this medication. This medication may make you feel generally unwell. This is not uncommon as chemotherapy can affect healthy cells as well as cancer cells. Report any side effects. Continue your course of treatment even though you feel ill unless your care team tells you to stop. This medication may increase your risk of getting an infection. Call your care team for advice  if you get a fever, chills, sore throat, or other symptoms of a cold or flu. Do not treat yourself. Try to avoid being around people who are sick. Avoid taking medications that contain aspirin, acetaminophen, ibuprofen, naproxen, or ketoprofen unless instructed by your care team. These medications may hide a fever. Be careful brushing or flossing your teeth or using a toothpick because you may get an infection or bleed more easily. If you have any dental work done, tell your dentist you are receiving this medication. This medication can make you more sensitive to cold. Do not drink cold drinks or use ice. Cover exposed skin before coming in contact with cold temperatures or cold objects. When out in cold weather wear warm clothing and cover your mouth and nose to warm the air that goes into your lungs. Tell your care team if you get sensitive to the cold. Talk to your care team if you or your partner are pregnant or think either of you might be pregnant. This medication can cause serious birth defects if taken during pregnancy and for 9 months after the last dose. A negative pregnancy test is required before starting this medication. A reliable form of contraception is recommended while taking this medication and for 9 months after the last dose. Talk to your care team about effective forms of contraception. Do not father a child  while taking this medication and for 6 months after the last dose. Use a condom while having sex during this time period. Do not breastfeed while taking this medication and for 3 months after the last dose. This medication may cause infertility. Talk to your care team if you are concerned about your fertility. What side effects may I notice from receiving this medication? Side effects that you should report to your care team as soon as possible: Allergic reactions--skin rash, itching, hives, swelling of the face, lips, tongue, or throat Bleeding--bloody or black, tar-like stools,  vomiting blood or brown material that looks like coffee grounds, red or dark brown urine, small red or purple spots on skin, unusual bruising or bleeding Dry cough, shortness of breath or trouble breathing Heart rhythm changes--fast or irregular heartbeat, dizziness, feeling faint or lightheaded, chest pain, trouble breathing Infection--fever, chills, cough, sore throat, wounds that don't heal, pain or trouble when passing urine, general feeling of discomfort or being unwell Liver injury--right upper belly pain, loss of appetite, nausea, light-colored stool, dark yellow or brown urine, yellowing skin or eyes, unusual weakness or fatigue Low red blood cell level--unusual weakness or fatigue, dizziness, headache, trouble breathing Muscle injury--unusual weakness or fatigue, muscle pain, dark yellow or brown urine, decrease in amount of urine Pain, tingling, or numbness in the hands or feet Sudden and severe headache, confusion, change in vision, seizures, which may be signs of posterior reversible encephalopathy syndrome (PRES) Unusual bruising or bleeding Side effects that usually do not require medical attention (report to your care team if they continue or are bothersome): Diarrhea Nausea Pain, redness, or swelling with sores inside the mouth or throat Unusual weakness or fatigue Vomiting This list may not describe all possible side effects. Call your doctor for medical advice about side effects. You may report side effects to FDA at 1-800-FDA-1088. Where should I keep my medication? This medication is given in a hospital or clinic. It will not be stored at home. NOTE: This sheet is a summary. It may not cover all possible information. If you have questions about this medicine, talk to your doctor, pharmacist, or health care provider.  2024 Elsevier/Gold Standard (2022-12-27 00:00:00)  Leucovorin Injection What is this medication? LEUCOVORIN (loo koe VOR in) prevents side effects from  certain medications, such as methotrexate. It works by increasing folate levels. This helps protect healthy cells in your body. It may also be used to treat anemia caused by low levels of folate. It can also be used with fluorouracil, a type of chemotherapy, to treat colorectal cancer. It works by increasing the effects of fluorouracil in the body. This medicine may be used for other purposes; ask your health care provider or pharmacist if you have questions. What should I tell my care team before I take this medication? They need to know if you have any of these conditions: Anemia from low levels of vitamin B12 in the blood An unusual or allergic reaction to leucovorin, folic acid, other medications, foods, dyes, or preservatives Pregnant or trying to get pregnant Breastfeeding How should I use this medication? This medication is injected into a vein or a muscle. It is given by your care team in a hospital or clinic setting. Talk to your care team about the use of this medication in children. Special care may be needed. Overdosage: If you think you have taken too much of this medicine contact a poison control center or emergency room at once. NOTE: This medicine is only for  you. Do not share this medicine with others. What if I miss a dose? Keep appointments for follow-up doses. It is important not to miss your dose. Call your care team if you are unable to keep an appointment. What may interact with this medication? Capecitabine Fluorouracil Phenobarbital Phenytoin Primidone Trimethoprim;sulfamethoxazole This list may not describe all possible interactions. Give your health care provider a list of all the medicines, herbs, non-prescription drugs, or dietary supplements you use. Also tell them if you smoke, drink alcohol, or use illegal drugs. Some items may interact with your medicine. What should I watch for while using this medication? Your condition will be monitored carefully while you  are receiving this medication. This medication may increase the side effects of 5-fluorouracil. Tell your care team if you have diarrhea or mouth sores that do not get better or that get worse. What side effects may I notice from receiving this medication? Side effects that you should report to your care team as soon as possible: Allergic reactions--skin rash, itching, hives, swelling of the face, lips, tongue, or throat This list may not describe all possible side effects. Call your doctor for medical advice about side effects. You may report side effects to FDA at 1-800-FDA-1088. Where should I keep my medication? This medication is given in a hospital or clinic. It will not be stored at home. NOTE: This sheet is a summary. It may not cover all possible information. If you have questions about this medicine, talk to your doctor, pharmacist, or health care provider.  2024 Elsevier/Gold Standard (2022-03-24 00:00:00)  Fluorouracil Injection What is this medication? FLUOROURACIL (flure oh YOOR a sil) treats some types of cancer. It works by slowing down the growth of cancer cells. This medicine may be used for other purposes; ask your health care provider or pharmacist if you have questions. COMMON BRAND NAME(S): Adrucil What should I tell my care team before I take this medication? They need to know if you have any of these conditions: Blood disorders Dihydropyrimidine dehydrogenase (DPD) deficiency Infection, such as chickenpox, cold sores, herpes Kidney disease Liver disease Poor nutrition Recent or ongoing radiation therapy An unusual or allergic reaction to fluorouracil, other medications, foods, dyes, or preservatives If you or your partner are pregnant or trying to get pregnant Breast-feeding How should I use this medication? This medication is injected into a vein. It is administered by your care team in a hospital or clinic setting. Talk to your care team about the use of this  medication in children. Special care may be needed. Overdosage: If you think you have taken too much of this medicine contact a poison control center or emergency room at once. NOTE: This medicine is only for you. Do not share this medicine with others. What if I miss a dose? Keep appointments for follow-up doses. It is important not to miss your dose. Call your care team if you are unable to keep an appointment. What may interact with this medication? Do not take this medication with any of the following: Live virus vaccines This medication may also interact with the following: Medications that treat or prevent blood clots, such as warfarin, enoxaparin, dalteparin This list may not describe all possible interactions. Give your health care provider a list of all the medicines, herbs, non-prescription drugs, or dietary supplements you use. Also tell them if you smoke, drink alcohol, or use illegal drugs. Some items may interact with your medicine. What should I watch for while using this medication? Your  condition will be monitored carefully while you are receiving this medication. This medication may make you feel generally unwell. This is not uncommon as chemotherapy can affect healthy cells as well as cancer cells. Report any side effects. Continue your course of treatment even though you feel ill unless your care team tells you to stop. In some cases, you may be given additional medications to help with side effects. Follow all directions for their use. This medication may increase your risk of getting an infection. Call your care team for advice if you get a fever, chills, sore throat, or other symptoms of a cold or flu. Do not treat yourself. Try to avoid being around people who are sick. This medication may increase your risk to bruise or bleed. Call your care team if you notice any unusual bleeding. Be careful brushing or flossing your teeth or using a toothpick because you may get an  infection or bleed more easily. If you have any dental work done, tell your dentist you are receiving this medication. Avoid taking medications that contain aspirin, acetaminophen, ibuprofen, naproxen, or ketoprofen unless instructed by your care team. These medications may hide a fever. Do not treat diarrhea with over the counter products. Contact your care team if you have diarrhea that lasts more than 2 days or if it is severe and watery. This medication can make you more sensitive to the sun. Keep out of the sun. If you cannot avoid being in the sun, wear protective clothing and sunscreen. Do not use sun lamps, tanning beds, or tanning booths. Talk to your care team if you or your partner wish to become pregnant or think you might be pregnant. This medication can cause serious birth defects if taken during pregnancy and for 3 months after the last dose. A reliable form of contraception is recommended while taking this medication and for 3 months after the last dose. Talk to your care team about effective forms of contraception. Do not father a child while taking this medication and for 3 months after the last dose. Use a condom while having sex during this time period. Do not breastfeed while taking this medication. This medication may cause infertility. Talk to your care team if you are concerned about your fertility. What side effects may I notice from receiving this medication? Side effects that you should report to your care team as soon as possible: Allergic reactions--skin rash, itching, hives, swelling of the face, lips, tongue, or throat Heart attack--pain or tightness in the chest, shoulders, arms, or jaw, nausea, shortness of breath, cold or clammy skin, feeling faint or lightheaded Heart failure--shortness of breath, swelling of the ankles, feet, or hands, sudden weight gain, unusual weakness or fatigue Heart rhythm changes--fast or irregular heartbeat, dizziness, feeling faint or  lightheaded, chest pain, trouble breathing High ammonia level--unusual weakness or fatigue, confusion, loss of appetite, nausea, vomiting, seizures Infection--fever, chills, cough, sore throat, wounds that don't heal, pain or trouble when passing urine, general feeling of discomfort or being unwell Low red blood cell level--unusual weakness or fatigue, dizziness, headache, trouble breathing Pain, tingling, or numbness in the hands or feet, muscle weakness, change in vision, confusion or trouble speaking, loss of balance or coordination, trouble walking, seizures Redness, swelling, and blistering of the skin over hands and feet Severe or prolonged diarrhea Unusual bruising or bleeding Side effects that usually do not require medical attention (report to your care team if they continue or are bothersome): Dry skin Headache Increased tears Nausea  Pain, redness, or swelling with sores inside the mouth or throat Sensitivity to light Vomiting This list may not describe all possible side effects. Call your doctor for medical advice about side effects. You may report side effects to FDA at 1-800-FDA-1088. Where should I keep my medication? This medication is given in a hospital or clinic. It will not be stored at home. NOTE: This sheet is a summary. It may not cover all possible information. If you have questions about this medicine, talk to your doctor, pharmacist, or health care provider.  2024 Elsevier/Gold Standard (2022-02-24 00:00:00)   The chemotherapy medication bag should finish at 46 hours, 96 hours, or 7 days. For example, if your pump is scheduled for 46 hours and it was put on at 4:00 p.m., it should finish at 2:00 p.m. the day it is scheduled to come off regardless of your appointment time.     Estimated time to finish at 12:15 pm on Thursday, June 6th, 2024.   If the display on your pump reads "Low Volume" and it is beeping, take the batteries out of the pump and come to the cancer  center for it to be taken off.   If the pump alarms go off prior to the pump reading "Low Volume" then call [phone] and someone can assist you.  If the plunger comes out and the chemotherapy medication is leaking out, please use your home chemo spill kit to clean up the spill. Do NOT use paper towels or other household products.  If you have problems or questions regarding your pump, please call either [phone] (24 hours a day) or the cancer center Monday-Friday 8:00 a.m.- 4:30 p.m. at the clinic number and we will assist you. If you are unable to get assistance, then go to the nearest Emergency Department and ask the staff to contact the IV team for assistance.

## 2023-04-07 ENCOUNTER — Other Ambulatory Visit: Payer: Self-pay

## 2023-04-07 ENCOUNTER — Inpatient Hospital Stay: Payer: Self-pay

## 2023-04-08 ENCOUNTER — Inpatient Hospital Stay: Payer: Self-pay

## 2023-04-08 ENCOUNTER — Other Ambulatory Visit: Payer: Self-pay

## 2023-04-08 ENCOUNTER — Encounter: Payer: Self-pay | Admitting: Oncology

## 2023-04-08 VITALS — BP 184/88 | HR 78 | Temp 98.7°F | Resp 18

## 2023-04-08 DIAGNOSIS — C182 Malignant neoplasm of ascending colon: Secondary | ICD-10-CM

## 2023-04-08 MED ORDER — SODIUM CHLORIDE 0.9% FLUSH
10.0000 mL | INTRAVENOUS | Status: DC | PRN
  Administered 2023-04-08: 10 mL

## 2023-04-08 MED ORDER — PEGFILGRASTIM-CBQV 6 MG/0.6ML ~~LOC~~ SOSY
6.0000 mg | PREFILLED_SYRINGE | Freq: Once | SUBCUTANEOUS | Status: AC
  Administered 2023-04-08: 6 mg via SUBCUTANEOUS
  Filled 2023-04-08: qty 0.6

## 2023-04-08 MED ORDER — HEPARIN SOD (PORK) LOCK FLUSH 100 UNIT/ML IV SOLN
500.0000 [IU] | Freq: Once | INTRAVENOUS | Status: AC | PRN
  Administered 2023-04-08: 500 [IU]

## 2023-04-08 NOTE — Patient Instructions (Signed)

## 2023-04-12 ENCOUNTER — Inpatient Hospital Stay: Payer: Self-pay

## 2023-04-12 ENCOUNTER — Inpatient Hospital Stay: Payer: Self-pay | Admitting: Oncology

## 2023-04-12 NOTE — Progress Notes (Unsigned)
Established Patient Office Visit  Subjective:  Patient ID: Aaron Roth, male    DOB: [birth year]  Age: 77 y.o. MRN: [id number]  CC:  Primary care follow-up  HPI 11/19/21 Aaron Roth presents for blood pressure follow-up.  Patient has try to be more compliant with his diet but yet on arrival blood pressure 149/87.  Patient does agree to receive the Prevnar 20 vaccine at this visit.  Patient does have cataracts he is trying to find a surgeon who will take care of him pro bono.  Patient has no other complaints Patient is still smoking about 3 to 4 cigarettes daily  03/05/2022 Patient returns today for follow-up of hypertension.  Patient has a blood pressure machine and brings in readings from home.  It was determined by [facility] blood pressure management team that he was hypertensive and not controlled and a phone call occurred to his home.  The Pharm.D. that called the patient determined that he had stopped all his blood pressure medicines.  He was confused because he thought when he was to have cataract surgery he was to stop all blood pressure medicines.  Instead he they just simply wanted to be nothing by mouth after midnight the night before the eye surgery.  Patient's been back on his medications which include carvedilol 25 mg twice daily amlodipine 10 mg daily hydralazine 25 mg twice daily valsartan HCT 320/25 1 daily Medications he brings him in with him he does need refills on several the medicines but has not run out.  He is having no difficulty with he does complain of some rash on his forearm and his upper back.  He has no other complaints at this time.  Blood pressure on arrival is excellent 138/77.  He has several 142/77 blood pressures on his home meter  7/11   This is a work in visit for abdominal pain.  Patient on arrival has an elevated blood pressure 174/81 he has not been compliant with his blood pressure medication skipping his evening doses of his twice daily meds and  missing amlodipine doses.  He had an episode about a month ago where he had emesis and abdominal pain left lower quadrant when he changed his food to healthier diet this pain went away.  He is due additional dental work at this time.  The patient does need a referral back to oncology for follow-up of his colon cancer  9/13 This patient is seen in return follow-up and unfortunately since July he has developed emesis and increased abdominal pain and a fullness in the abdomen and went to oncology where he was found to have an abdominal mass.  He is about to have a biopsy of this lesion.  Likely has recurrence of his colon cancer unfortunately.  From a blood pressure perspective he has stopped smoking this week which has been a major success for him.  He however occasionally does not take his afternoon dose of carvedilol and hydralazine.  He has been maintaining amlodipine daily and valsartan HCT daily.  The patient has no other complaints at this time. Below is a copy of the colon cancer evaluation at oncology August 21  Oncology appt 8/21: Colon cancer  CT abdomen/pelvis 08/02/2020-small amount of free fluid in the pelvis, postsurgical change involving the left colon, trace right pleural fluid.   Colonoscopy 08/04/2020-cecal mass as well as a 3 mm polyp in the ascending colon, 9 mm polyp in the ascending colon, 3 mm polyp in the transverse colon  and 4 small polyps in the transverse colon.  Biopsy of the cecal mass showed adenocarcinoma.  Pathology on the polyps showed tubular adenomas with no high-grade dysplasia or malignancy identified. CT chest 08/04/2020-no evidence of metastatic disease CEA 08/04/2020-2.0 Laparoscopic right colectomy 10/17/2020 by Dr. Dorothyann Gibbs showed adenocarcinoma with mucinous features, moderately differentiated, 6.7 cm; no carcinoma identified and 23 lymph nodes; margins uninvolved; no macroscopic tumor perforation identified; tumor invaded through muscularis propria into  pericolorectal tissue; no lymphovascular invasion and no perineural invasion, pT3, pN0, mismatch repair protein IHC normal, MSI stable. 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 He completed cycle 1 CAPOX beginning 11/13/2011; cycle 7 beginning 03/23/2012. Cycle 8 was completed beginning on 04/13/2012 without oxaliplatin. Restaging CT scans 06/06/2012 without evidence of recurrent disease.   Family history-brother with colon cancer Multiple polyps on colonoscopy 08/04/2020   CT Abd mass Bx 08/03/22 scheduled Blood pressure initially are elevated on arrival on recheck is 135/76  10/28/22 Patient seen in return follow-up blood pressure remains high systolic levels are high diastolic are acceptable.  He forgets to take his afternoon carvedilol dose.  He also states hydralazine causes itching when he takes this and has not been taking this medication.  He has been compliant with his other blood pressure medicines.  He is yet to get the abdominal mass biopsy recommended by oncology.  He does have dental conditions he is undergoing dental care to have all teeth extracted.  He has no other complaints. Patient declined the flu vaccine  01/07/23 Patient seen in follow-up he had his abdominal mass biopsied it is recurrent colon cancer he is now seeing oncology and has had 1 cycle of chemotherapy and a Port-A-Cath placed.  He has had all of his carious teeth now removed.  He is being fitted for dentures.  On arrival blood pressure elevated 145/88.  His goals to be less than 140/90.  He still smoking 2  cigarettes daily.  He has lost some weight.  He had neutropenia and has had to receive a blood transfusion with the chemotherapy.  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   Essential hypertension 08/10/2020   GERD (gastroesophageal reflux disease)    pt. denies    Past Surgical History:  Procedure Laterality Date   BIOPSY  08/04/2020   Procedure: BIOPSY;  Surgeon: [name], MD;  Location: [facility];  Service: Endoscopy;;  EGD and COLON   COLONOSCOPY  06/03/2012   Procedure: COLONOSCOPY;  Surgeon: [name], MD;  Location: [facility];  Service: General;  Laterality: N/A;   COLONOSCOPY WITH PROPOFOL N/A 08/04/2020   Procedure: COLONOSCOPY WITH PROPOFOL;  Surgeon: [name], MD;  Location: [facility];  Service: Endoscopy;  Laterality:  N/A;   COLOSTOMY  09/25/2011   Procedure: COLOSTOMY;  Surgeon: Kandis Cocking, MD;  Location: [facility];  Service: General;  Laterality: N/A;   COLOSTOMY TAKEDOWN  07/14/2012   Procedure: LAPAROSCOPIC COLOSTOMY TAKEDOWN;  Surgeon: Kandis Cocking, MD;  Location: [facility];  Service: General;  Laterality: N/A;  laparoscopic coverted to open takedown of colostomy   ESOPHAGOGASTRODUODENOSCOPY (EGD) WITH PROPOFOL N/A 08/04/2020   Procedure: ESOPHAGOGASTRODUODENOSCOPY (EGD) WITH PROPOFOL;  Surgeon: Bernette Redbird, MD;  Location: [facility];  Service: Endoscopy;  Laterality: N/A;   GANGLION CYST EXCISION     wrist and foot   IR IMAGING GUIDED PORT INSERTION  12/04/2022   LAPAROSCOPIC PARTIAL COLECTOMY N/A 10/17/2020   Procedure: LAPAROSCOPIC RIGHT COLECTOMY;   Surgeon: Romie Levee, MD;  Location: [facility];  Service: General;  Laterality: N/A;   PARTIAL COLECTOMY  09/25/2011   Procedure: PARTIAL COLECTOMY;  Surgeon: Kandis Cocking, MD;  Location: [facility];  Service: General;;   POLYPECTOMY  08/04/2020   Procedure: POLYPECTOMY;  Surgeon: Bernette Redbird, MD;  Location: [facility];  Service: Endoscopy;;   PORT-A-CATH REMOVAL  07/14/2012   Procedure: REMOVAL PORT-A-CATH;  Surgeon: Kandis Cocking, MD;  Location: [facility];  Service: General;  Laterality: N/A;   PORTACATH PLACEMENT  11/10/2011   Procedure: INSERTION PORT-A-CATH;  Surgeon: Kandis Cocking, MD;  Location: [facility] [place] [facility];  Service: General;  Laterality: Left;  left subclavian   PORTACATH PLACEMENT  11/10/2011    Family History  Problem Relation Age of Onset   Bone cancer Brother    ALS Sister    Cancer Brother        bone, colon, testicular   Cancer Father        prostate    Social History   Socioeconomic History   Marital status: Married    Spouse name: Not on file   Number of children: Not on file   Years of education: Not on file   Highest education level: Not on file  Occupational History   Not on file  Tobacco Use   Smoking status: Some Days    Packs/day: 0.25    Years: 40.00    Additional pack years: 0.00    Total pack years: 10.00    Types: Cigarettes   Smokeless tobacco: Never  Vaping Use   Vaping Use: Never used  Substance and Sexual Activity   Alcohol use: No   Drug use: No   Sexual activity: Not Currently  Other Topics Concern   Not on file  Social History Narrative   Not on file   Social Determinants of Health   Financial Resource Strain: Not on file  Food Insecurity: Not on file  Transportation Needs: Not on file  Physical Activity: Not on file  Stress: Not on file  Social Connections: Not on file  Intimate Partner Violence: Not on file    Outpatient Medications Prior to Visit  Medication Sig Dispense Refill   amLODipine (NORVASC) 10  MG tablet Take 1 tablet (10 mg total) by mouth daily. 90 tablet 1   carvedilol (COREG) 25 MG tablet Take 1 tablet (25 mg total) by mouth 2 (two) times daily with a meal. 180 tablet 1   ferrous sulfate 325 (65 FE) MG tablet TAKE 1 TABLET (325 MG TOTAL) BY MOUTH 2 (TWO) TIMES DAILY WITH A MEAL. (Patient taking differently: Take 325 mg by mouth 3 (three) times daily with meals.) 180 tablet 1   lidocaine-prilocaine (EMLA) cream Apply  to port site 1-2 hours prior to use 30 g 2   Nutritional Supplements (IMMUNE ENHANCE) TABS Take 1 tablet by mouth daily.     potassium chloride SA (KLOR-CON M) 20 MEQ tablet Take 1 tablet (20 mEq total) by mouth 2 (two) times daily for 3 days, THEN 1 tablet (20 mEq total) daily for 24 days. 30 tablet 2   prochlorperazine (COMPAZINE) 10 MG tablet Take 1 tablet (10 mg total) by mouth every 6 (six) hours as needed for nausea or vomiting. (Patient not taking: Reported on 04/06/2023) 30 tablet 3   triamcinolone cream (KENALOG) 0.1 % Apply 1 application. topically 2 (two) times daily. (Patient not taking: Reported on 01/11/2023) 30 g 0   Facility-Administered Medications Prior to Visit  Medication Dose Route Frequency Provider Last Rate Last Admin   sodium chloride flush (NS) 0.9 % injection 10 mL  10 mL Intravenous PRN Rana Snare, NP   10 mL at 12/21/22 1023   sodium chloride flush (NS) 0.9 % injection 10 mL  10 mL Intravenous PRN Ladene Artist, MD   10 mL at 03/08/23 1212    Allergies  Allergen Reactions   Hydralazine Itching   Other     Pre-breaded shrimp    ROS Review of Systems  Constitutional:  Positive for appetite change and unexpected weight change.  HENT: Negative.  Negative for ear pain, postnasal drip, rhinorrhea, sinus pressure, sore throat, trouble swallowing and voice change.   Eyes: Negative.   Respiratory: Negative.  Negative for apnea, cough, choking, chest tightness, shortness of breath, wheezing and stridor.   Cardiovascular: Negative.  Negative  for chest pain, palpitations and leg swelling.  Gastrointestinal:  Positive for abdominal distention and abdominal pain. Negative for nausea and vomiting.  Genitourinary: Negative.   Musculoskeletal: Negative.  Negative for arthralgias and myalgias.  Skin: Negative.  Negative for rash.  Allergic/Immunologic: Negative.  Negative for environmental allergies and food allergies.  Neurological: Negative.  Negative for dizziness, syncope, weakness and headaches.  Hematological: Negative.  Negative for adenopathy. Does not bruise/bleed easily.  Psychiatric/Behavioral: Negative.  Negative for agitation and sleep disturbance. The patient is not nervous/anxious.       Objective:    Physical Exam Vitals reviewed.  Constitutional:      Appearance: Normal appearance. He is well-developed. He is not diaphoretic.     Comments: Weight loss from treatments and cancer  HENT:     Head: Normocephalic and atraumatic.     Nose: No nasal deformity, septal deviation, mucosal edema or rhinorrhea.     Right Sinus: No maxillary sinus tenderness or frontal sinus tenderness.     Left Sinus: No maxillary sinus tenderness or frontal sinus tenderness.     Mouth/Throat:     Pharynx: No oropharyngeal exudate.     Comments: Edentulous Eyes:     General: No scleral icterus.    Conjunctiva/sclera: Conjunctivae normal.     Pupils: Pupils are equal, round, and reactive to light.  Neck:     Thyroid: No thyromegaly.     Vascular: No carotid bruit or JVD.     Trachea: Trachea normal. No tracheal tenderness or tracheal deviation.  Cardiovascular:     Rate and Rhythm: Normal rate and regular rhythm.     Chest Wall: PMI is not displaced.     Pulses: Normal pulses. No decreased pulses.     Heart sounds: Normal heart sounds, S1 normal and S2 normal. Heart sounds not distant. No murmur heard.  No systolic murmur is present.     No diastolic murmur is present.     No friction rub. No gallop. No S3 or S4 sounds.   Pulmonary:     Effort: No tachypnea, accessory muscle usage or respiratory distress.     Breath sounds: No stridor. No decreased breath sounds, wheezing, rhonchi or rales.  Chest:     Chest wall: No tenderness.  Abdominal:     General: Bowel sounds are normal. There is no distension.     Palpations: Abdomen is soft. Abdomen is not rigid. There is mass.     Tenderness: There is no abdominal tenderness. There is no right CVA tenderness, left CVA tenderness, guarding or rebound.     Hernia: No hernia is present.     Comments: Firm mass is palpated in the umbilical area appears to be nontender and correlates with the CT scan findings, seems to be enlarging  Musculoskeletal:        General: Normal range of motion.     Cervical back: Normal range of motion and neck supple. No edema, erythema or rigidity. No muscular tenderness. Normal range of motion.  Lymphadenopathy:     Head:     Right side of head: No submental or submandibular adenopathy.     Left side of head: No submental or submandibular adenopathy.     Cervical: No cervical adenopathy.  Skin:    General: Skin is warm and dry.     Coloration: Skin is not pale.     Findings: No rash.     Nails: There is no clubbing.  Neurological:     Mental Status: He is alert and oriented to person, place, and time.     Sensory: No sensory deficit.  Psychiatric:        Speech: Speech normal.        Behavior: Behavior normal.     There were no vitals taken for this visit. Wt Readings from Last 3 Encounters:  04/06/23 157 lb (71.2 kg)  03/15/23 159 lb 7 oz (72.3 kg)  03/08/23 155 lb 8 oz (70.5 kg)     There are no preventive care reminders to display for this patient.     There are no preventive care reminders to display for this patient.  No results found for: "TSH" Lab Results  Component Value Date   WBC 5.8 04/06/2023   HGB 7.5 (L) 04/06/2023   HCT 26.3 (L) 04/06/2023   MCV 71.5 (L) 04/06/2023   PLT 358 04/06/2023   Lab  Results  Component Value Date   NA 138 04/06/2023   K 3.0 (L) 04/06/2023   CO2 30 04/06/2023   GLUCOSE 82 04/06/2023   BUN 11 04/06/2023   CREATININE 0.67 04/06/2023   BILITOT 0.4 04/06/2023   ALKPHOS 87 04/06/2023   AST 12 (L) 04/06/2023   ALT 6 04/06/2023   PROT 5.6 (L) 04/06/2023   ALBUMIN 2.9 (L) 04/06/2023   CALCIUM 8.0 (L) 04/06/2023   ANIONGAP 7 04/06/2023   EGFR 68 10/16/2021   No results found for: "CHOL" No results found for: "HDL" No results found for: "LDLCALC" No results found for: "TRIG" No results found for: "CHOLHDL" No results found for: "HGBA1C" Recent abdominal CT scan performed by oncology reviewed and does show the presence of a 10 x 7 x 6 cm mass with small bowel involvement compatible with recurrence of colon cancer   Assessment & Plan:   Problem List Items Addressed This Visit  None No orders of the defined types were placed in this encounter.   Follow-up: No follow-ups on file.    Shan Levans, MD

## 2023-04-14 ENCOUNTER — Inpatient Hospital Stay: Payer: Self-pay

## 2023-04-15 ENCOUNTER — Ambulatory Visit: Payer: Self-pay | Admitting: Critical Care Medicine

## 2023-04-15 ENCOUNTER — Ambulatory Visit: Payer: MEDICAID | Attending: Critical Care Medicine | Admitting: Critical Care Medicine

## 2023-04-15 ENCOUNTER — Encounter: Payer: Self-pay | Admitting: Critical Care Medicine

## 2023-04-15 ENCOUNTER — Encounter: Payer: Self-pay | Admitting: Oncology

## 2023-04-15 VITALS — BP 171/77 | HR 72 | Ht 67.0 in | Wt 155.0 lb

## 2023-04-15 DIAGNOSIS — I10 Essential (primary) hypertension: Secondary | ICD-10-CM

## 2023-04-15 DIAGNOSIS — C182 Malignant neoplasm of ascending colon: Secondary | ICD-10-CM

## 2023-04-15 NOTE — Assessment & Plan Note (Signed)
Care per oncology

## 2023-04-15 NOTE — Assessment & Plan Note (Signed)
Allowing systolic to be between 160 and 170 diastolic less than 80 this is goal for this patient continue current medication of amlodipine and carvedilol

## 2023-04-15 NOTE — Patient Instructions (Signed)
No change in medications Return 3 months 

## 2023-04-16 ENCOUNTER — Other Ambulatory Visit: Payer: Self-pay

## 2023-04-19 ENCOUNTER — Inpatient Hospital Stay: Payer: Self-pay

## 2023-04-19 ENCOUNTER — Inpatient Hospital Stay (HOSPITAL_BASED_OUTPATIENT_CLINIC_OR_DEPARTMENT_OTHER): Payer: Self-pay | Admitting: Nurse Practitioner

## 2023-04-19 ENCOUNTER — Encounter: Payer: Self-pay | Admitting: Nurse Practitioner

## 2023-04-19 VITALS — BP 163/80 | HR 71 | Temp 98.1°F | Resp 18 | Ht 67.0 in | Wt 152.0 lb

## 2023-04-19 VITALS — BP 162/81 | HR 88 | Temp 97.6°F | Resp 18

## 2023-04-19 DIAGNOSIS — C182 Malignant neoplasm of ascending colon: Secondary | ICD-10-CM

## 2023-04-19 LAB — CBC WITH DIFFERENTIAL (CANCER CENTER ONLY)
Abs Immature Granulocytes: 0.27 10*3/uL — ABNORMAL HIGH (ref 0.00–0.07)
Basophils Absolute: 0.1 10*3/uL (ref 0.0–0.1)
Basophils Relative: 1 %
Eosinophils Absolute: 0.2 10*3/uL (ref 0.0–0.5)
Eosinophils Relative: 1 %
HCT: 26.6 % — ABNORMAL LOW (ref 39.0–52.0)
Hemoglobin: 7.6 g/dL — ABNORMAL LOW (ref 13.0–17.0)
Immature Granulocytes: 2 %
Lymphocytes Relative: 13 %
Lymphs Abs: 1.6 10*3/uL (ref 0.7–4.0)
MCH: 20.2 pg — ABNORMAL LOW (ref 26.0–34.0)
MCHC: 28.6 g/dL — ABNORMAL LOW (ref 30.0–36.0)
MCV: 70.7 fL — ABNORMAL LOW (ref 80.0–100.0)
Monocytes Absolute: 0.9 10*3/uL (ref 0.1–1.0)
Monocytes Relative: 7 %
Neutro Abs: 9.8 10*3/uL — ABNORMAL HIGH (ref 1.7–7.7)
Neutrophils Relative %: 76 %
Platelet Count: 410 10*3/uL — ABNORMAL HIGH (ref 150–400)
RBC: 3.76 MIL/uL — ABNORMAL LOW (ref 4.22–5.81)
RDW: 23 % — ABNORMAL HIGH (ref 11.5–15.5)
WBC Count: 12.8 10*3/uL — ABNORMAL HIGH (ref 4.0–10.5)
nRBC: 0 % (ref 0.0–0.2)

## 2023-04-19 LAB — CMP (CANCER CENTER ONLY)
ALT: 7 U/L (ref 0–44)
AST: 13 U/L — ABNORMAL LOW (ref 15–41)
Albumin: 2.9 g/dL — ABNORMAL LOW (ref 3.5–5.0)
Alkaline Phosphatase: 135 U/L — ABNORMAL HIGH (ref 38–126)
Anion gap: 6 (ref 5–15)
BUN: 8 mg/dL (ref 8–23)
CO2: 31 mmol/L (ref 22–32)
Calcium: 8 mg/dL — ABNORMAL LOW (ref 8.9–10.3)
Chloride: 101 mmol/L (ref 98–111)
Creatinine: 0.73 mg/dL (ref 0.61–1.24)
GFR, Estimated: >60 mL/min (ref >60)
Glucose, Bld: 77 mg/dL (ref 70–99)
Potassium: 2.9 mmol/L — ABNORMAL LOW (ref 3.5–5.1)
Sodium: 138 mmol/L (ref 135–145)
Total Bilirubin: 0.2 mg/dL — ABNORMAL LOW (ref 0.3–1.2)
Total Protein: 5.7 g/dL — ABNORMAL LOW (ref 6.5–8.1)

## 2023-04-19 MED ORDER — FAMOTIDINE IN NACL 20-0.9 MG/50ML-% IV SOLN
20.0000 mg | Freq: Once | INTRAVENOUS | Status: AC
  Administered 2023-04-19: 20 mg via INTRAVENOUS
  Filled 2023-04-19: qty 50

## 2023-04-19 MED ORDER — DEXTROSE 5 % IV SOLN: Freq: Once | INTRAVENOUS | Status: AC

## 2023-04-19 MED ORDER — OXALIPLATIN CHEMO INJECTION 100 MG/20ML
85.0000 mg/m2 | Freq: Once | INTRAVENOUS | Status: AC
  Administered 2023-04-19: 150 mg via INTRAVENOUS
  Filled 2023-04-19: qty 20

## 2023-04-19 MED ORDER — SODIUM CHLORIDE 0.9 % IV SOLN
10.0000 mg | Freq: Once | INTRAVENOUS | Status: AC
  Administered 2023-04-19: 10 mg via INTRAVENOUS
  Filled 2023-04-19: qty 10

## 2023-04-19 MED ORDER — SODIUM CHLORIDE 0.9 % IV SOLN
2000.0000 mg/m2 | INTRAVENOUS | Status: DC
  Administered 2023-04-19: 3500 mg via INTRAVENOUS
  Filled 2023-04-19: qty 20

## 2023-04-19 MED ORDER — LEUCOVORIN CALCIUM INJECTION 350 MG
400.0000 mg/m2 | Freq: Once | INTRAVENOUS | Status: AC
  Administered 2023-04-19: 732 mg via INTRAVENOUS
  Filled 2023-04-19: qty 36.6

## 2023-04-19 MED ORDER — PALONOSETRON HCL INJECTION 0.25 MG/5ML
0.2500 mg | Freq: Once | INTRAVENOUS | Status: AC
  Administered 2023-04-19: 0.25 mg via INTRAVENOUS
  Filled 2023-04-19: qty 5

## 2023-04-19 MED ORDER — FLUOROURACIL CHEMO INJECTION 2.5 GM/50ML
400.0000 mg/m2 | Freq: Once | INTRAVENOUS | Status: AC
  Administered 2023-04-19: 750 mg via INTRAVENOUS
  Filled 2023-04-19: qty 15

## 2023-04-19 MED ORDER — SODIUM CHLORIDE 0.9 % IV SOLN: Freq: Once | INTRAVENOUS | Status: DC | PRN

## 2023-04-19 MED ORDER — FAMOTIDINE IN NACL 20-0.9 MG/50ML-% IV SOLN
20.0000 mg | Freq: Once | INTRAVENOUS | Status: AC | PRN
  Administered 2023-04-19: 20 mg via INTRAVENOUS

## 2023-04-19 MED ORDER — DIPHENHYDRAMINE HCL 25 MG PO CAPS
50.0000 mg | ORAL_CAPSULE | Freq: Once | ORAL | Status: AC
  Administered 2023-04-19: 50 mg via ORAL
  Filled 2023-04-19: qty 2

## 2023-04-19 NOTE — Patient Instructions (Signed)
[facility]   Discharge Instructions: Thank you for choosing [facility] to provide your oncology and hematology care.   If you have a lab appointment with the [facility], please go directly to the [facility] and check in at the registration area.   Wear comfortable clothing and clothing appropriate for easy access to any Portacath or PICC line.   We strive to give you quality time with your provider. You may need to reschedule your appointment if you arrive late (15 or more minutes).  Arriving late affects you and other patients whose appointments are after yours.  Also, if you miss three or more appointments without notifying the office, you may be dismissed from the clinic at the provider's discretion.      For prescription refill requests, have your pharmacy contact our office and allow 72 hours for refills to be completed.    Today you received the following chemotherapy and/or immunotherapy agents Oxaliplatin (ELOXATIN), Leucovorin & Flourouracil (ADRUCIL).      To help prevent nausea and vomiting after your treatment, we encourage you to take your nausea medication as directed.  BELOW ARE SYMPTOMS THAT SHOULD BE REPORTED IMMEDIATELY: *FEVER GREATER THAN 100.4 F (38 C) OR HIGHER *CHILLS OR SWEATING *NAUSEA AND VOMITING THAT IS NOT CONTROLLED WITH YOUR NAUSEA MEDICATION *UNUSUAL SHORTNESS OF BREATH *UNUSUAL BRUISING OR BLEEDING *URINARY PROBLEMS (pain or burning when urinating, or frequent urination) *BOWEL PROBLEMS (unusual diarrhea, constipation, pain near the anus) TENDERNESS IN MOUTH AND THROAT WITH OR WITHOUT PRESENCE OF ULCERS (sore throat, sores in mouth, or a toothache) UNUSUAL RASH, SWELLING OR PAIN  UNUSUAL VAGINAL DISCHARGE OR ITCHING   Items with * indicate a potential emergency and should be followed up as soon as possible or go to the Emergency Department if any problems should occur.  Please show the  CHEMOTHERAPY ALERT CARD or IMMUNOTHERAPY ALERT CARD at check-in to the Emergency Department and triage nurse.  Should you have questions after your visit or need to cancel or reschedule your appointment, please contact [facility]  Dept: [phone]  and follow the prompts.  Office hours are 8:00 a.m. to 4:30 p.m. Monday - Friday. Please note that voicemails left after 4:00 p.m. may not be returned until the following business day.  We are closed weekends and major holidays. You have access to a nurse at all times for urgent questions. Please call the main number to the clinic Dept: [phone] and follow the prompts.   For any non-urgent questions, you may also contact your provider using MyChart. We now offer e-Visits for anyone 42 and older to request care online for non-urgent symptoms. For details visit [email or web address].   Also download the MyChart app! Go to the app store, search "MyChart", open the app, select [facility], and log in with your MyChart username and password.  Oxaliplatin Injection What is this medication? OXALIPLATIN (ox AL i PLA tin) treats colorectal cancer. It works by slowing down the growth of cancer cells. This medicine may be used for other purposes; ask your health care provider or pharmacist if you have questions. COMMON BRAND NAME(S): Eloxatin What should I tell my care team before I take this medication? They need to know if you have any of these conditions: Heart disease History of irregular heartbeat or rhythm Liver disease Low blood cell levels (white cells, red cells, and platelets) Lung or breathing disease, such as asthma Take medications that treat  or prevent blood clots Tingling of the fingers, toes, or other nerve disorder An unusual or allergic reaction to oxaliplatin, other medications, foods, dyes, or preservatives If you or your partner are pregnant or trying to get pregnant Breast-feeding How should  I use this medication? This medication is injected into a vein. It is given by your care team in a hospital or clinic setting. Talk to your care team about the use of this medication in children. Special care may be needed. Overdosage: If you think you have taken too much of this medicine contact a poison control center or emergency room at once. NOTE: This medicine is only for you. Do not share this medicine with others. What if I miss a dose? Keep appointments for follow-up doses. It is important not to miss a dose. Call your care team if you are unable to keep an appointment. What may interact with this medication? Do not take this medication with any of the following: Cisapride Dronedarone Pimozide Thioridazine This medication may also interact with the following: Aspirin and aspirin-like medications Certain medications that treat or prevent blood clots, such as warfarin, apixaban, dabigatran, and rivaroxaban Cisplatin Cyclosporine Diuretics Medications for infection, such as acyclovir, adefovir, amphotericin B, bacitracin, cidofovir, foscarnet, ganciclovir, gentamicin, pentamidine, vancomycin NSAIDs, medications for pain and inflammation, such as ibuprofen or naproxen Other medications that cause heart rhythm changes Pamidronate Zoledronic acid This list may not describe all possible interactions. Give your health care provider a list of all the medicines, herbs, non-prescription drugs, or dietary supplements you use. Also tell them if you smoke, drink alcohol, or use illegal drugs. Some items may interact with your medicine. What should I watch for while using this medication? Your condition will be monitored carefully while you are receiving this medication. You may need blood work while taking this medication. This medication may make you feel generally unwell. This is not uncommon as chemotherapy can affect healthy cells as well as cancer cells. Report any side effects. Continue  your course of treatment even though you feel ill unless your care team tells you to stop. This medication may increase your risk of getting an infection. Call your care team for advice if you get a fever, chills, sore throat, or other symptoms of a cold or flu. Do not treat yourself. Try to avoid being around people who are sick. Avoid taking medications that contain aspirin, acetaminophen, ibuprofen, naproxen, or ketoprofen unless instructed by your care team. These medications may hide a fever. Be careful brushing or flossing your teeth or using a toothpick because you may get an infection or bleed more easily. If you have any dental work done, tell your dentist you are receiving this medication. This medication can make you more sensitive to cold. Do not drink cold drinks or use ice. Cover exposed skin before coming in contact with cold temperatures or cold objects. When out in cold weather wear warm clothing and cover your mouth and nose to warm the air that goes into your lungs. Tell your care team if you get sensitive to the cold. Talk to your care team if you or your partner are pregnant or think either of you might be pregnant. This medication can cause serious birth defects if taken during pregnancy and for 9 months after the last dose. A negative pregnancy test is required before starting this medication. A reliable form of contraception is recommended while taking this medication and for 9 months after the last dose. Talk to your care  team about effective forms of contraception. Do not father a child while taking this medication and for 6 months after the last dose. Use a condom while having sex during this time period. Do not breastfeed while taking this medication and for 3 months after the last dose. This medication may cause infertility. Talk to your care team if you are concerned about your fertility. What side effects may I notice from receiving this medication? Side effects that you  should report to your care team as soon as possible: Allergic reactions--skin rash, itching, hives, swelling of the face, lips, tongue, or throat Bleeding--bloody or black, tar-like stools, vomiting blood or brown material that looks like coffee grounds, red or dark brown urine, small red or purple spots on skin, unusual bruising or bleeding Dry cough, shortness of breath or trouble breathing Heart rhythm changes--fast or irregular heartbeat, dizziness, feeling faint or lightheaded, chest pain, trouble breathing Infection--fever, chills, cough, sore throat, wounds that don't heal, pain or trouble when passing urine, general feeling of discomfort or being unwell Liver injury--right upper belly pain, loss of appetite, nausea, light-colored stool, dark yellow or brown urine, yellowing skin or eyes, unusual weakness or fatigue Low red blood cell level--unusual weakness or fatigue, dizziness, headache, trouble breathing Muscle injury--unusual weakness or fatigue, muscle pain, dark yellow or brown urine, decrease in amount of urine Pain, tingling, or numbness in the hands or feet Sudden and severe headache, confusion, change in vision, seizures, which may be signs of posterior reversible encephalopathy syndrome (PRES) Unusual bruising or bleeding Side effects that usually do not require medical attention (report to your care team if they continue or are bothersome): Diarrhea Nausea Pain, redness, or swelling with sores inside the mouth or throat Unusual weakness or fatigue Vomiting This list may not describe all possible side effects. Call your doctor for medical advice about side effects. You may report side effects to FDA at 1-800-FDA-1088. Where should I keep my medication? This medication is given in a hospital or clinic. It will not be stored at home. NOTE: This sheet is a summary. It may not cover all possible information. If you have questions about this medicine, talk to your doctor,  pharmacist, or health care provider.  2024 Elsevier/Gold Standard (2022-12-27 00:00:00)  Leucovorin Injection What is this medication? LEUCOVORIN (loo koe VOR in) prevents side effects from certain medications, such as methotrexate. It works by increasing folate levels. This helps protect healthy cells in your body. It may also be used to treat anemia caused by low levels of folate. It can also be used with fluorouracil, a type of chemotherapy, to treat colorectal cancer. It works by increasing the effects of fluorouracil in the body. This medicine may be used for other purposes; ask your health care provider or pharmacist if you have questions. What should I tell my care team before I take this medication? They need to know if you have any of these conditions: Anemia from low levels of vitamin B12 in the blood An unusual or allergic reaction to leucovorin, folic acid, other medications, foods, dyes, or preservatives Pregnant or trying to get pregnant Breastfeeding How should I use this medication? This medication is injected into a vein or a muscle. It is given by your care team in a hospital or clinic setting. Talk to your care team about the use of this medication in children. Special care may be needed. Overdosage: If you think you have taken too much of this medicine contact a poison control center  or emergency room at once. NOTE: This medicine is only for you. Do not share this medicine with others. What if I miss a dose? Keep appointments for follow-up doses. It is important not to miss your dose. Call your care team if you are unable to keep an appointment. What may interact with this medication? Capecitabine Fluorouracil Phenobarbital Phenytoin Primidone Trimethoprim;sulfamethoxazole This list may not describe all possible interactions. Give your health care provider a list of all the medicines, herbs, non-prescription drugs, or dietary supplements you use. Also tell them if you  smoke, drink alcohol, or use illegal drugs. Some items may interact with your medicine. What should I watch for while using this medication? Your condition will be monitored carefully while you are receiving this medication. This medication may increase the side effects of 5-fluorouracil. Tell your care team if you have diarrhea or mouth sores that do not get better or that get worse. What side effects may I notice from receiving this medication? Side effects that you should report to your care team as soon as possible: Allergic reactions--skin rash, itching, hives, swelling of the face, lips, tongue, or throat This list may not describe all possible side effects. Call your doctor for medical advice about side effects. You may report side effects to FDA at 1-800-FDA-1088. Where should I keep my medication? This medication is given in a hospital or clinic. It will not be stored at home. NOTE: This sheet is a summary. It may not cover all possible information. If you have questions about this medicine, talk to your doctor, pharmacist, or health care provider.  2024 Elsevier/Gold Standard (2022-03-24 00:00:00)  Fluorouracil Injection What is this medication? FLUOROURACIL (flure oh YOOR a sil) treats some types of cancer. It works by slowing down the growth of cancer cells. This medicine may be used for other purposes; ask your health care provider or pharmacist if you have questions. COMMON BRAND NAME(S): Adrucil What should I tell my care team before I take this medication? They need to know if you have any of these conditions: Blood disorders Dihydropyrimidine dehydrogenase (DPD) deficiency Infection, such as chickenpox, cold sores, herpes Kidney disease Liver disease Poor nutrition Recent or ongoing radiation therapy An unusual or allergic reaction to fluorouracil, other medications, foods, dyes, or preservatives If you or your partner are pregnant or trying to get  pregnant Breast-feeding How should I use this medication? This medication is injected into a vein. It is administered by your care team in a hospital or clinic setting. Talk to your care team about the use of this medication in children. Special care may be needed. Overdosage: If you think you have taken too much of this medicine contact a poison control center or emergency room at once. NOTE: This medicine is only for you. Do not share this medicine with others. What if I miss a dose? Keep appointments for follow-up doses. It is important not to miss your dose. Call your care team if you are unable to keep an appointment. What may interact with this medication? Do not take this medication with any of the following: Live virus vaccines This medication may also interact with the following: Medications that treat or prevent blood clots, such as warfarin, enoxaparin, dalteparin This list may not describe all possible interactions. Give your health care provider a list of all the medicines, herbs, non-prescription drugs, or dietary supplements you use. Also tell them if you smoke, drink alcohol, or use illegal drugs. Some items may interact with your  medicine. What should I watch for while using this medication? Your condition will be monitored carefully while you are receiving this medication. This medication may make you feel generally unwell. This is not uncommon as chemotherapy can affect healthy cells as well as cancer cells. Report any side effects. Continue your course of treatment even though you feel ill unless your care team tells you to stop. In some cases, you may be given additional medications to help with side effects. Follow all directions for their use. This medication may increase your risk of getting an infection. Call your care team for advice if you get a fever, chills, sore throat, or other symptoms of a cold or flu. Do not treat yourself. Try to avoid being around people who are  sick. This medication may increase your risk to bruise or bleed. Call your care team if you notice any unusual bleeding. Be careful brushing or flossing your teeth or using a toothpick because you may get an infection or bleed more easily. If you have any dental work done, tell your dentist you are receiving this medication. Avoid taking medications that contain aspirin, acetaminophen, ibuprofen, naproxen, or ketoprofen unless instructed by your care team. These medications may hide a fever. Do not treat diarrhea with over the counter products. Contact your care team if you have diarrhea that lasts more than 2 days or if it is severe and watery. This medication can make you more sensitive to the sun. Keep out of the sun. If you cannot avoid being in the sun, wear protective clothing and sunscreen. Do not use sun lamps, tanning beds, or tanning booths. Talk to your care team if you or your partner wish to become pregnant or think you might be pregnant. This medication can cause serious birth defects if taken during pregnancy and for 3 months after the last dose. A reliable form of contraception is recommended while taking this medication and for 3 months after the last dose. Talk to your care team about effective forms of contraception. Do not father a child while taking this medication and for 3 months after the last dose. Use a condom while having sex during this time period. Do not breastfeed while taking this medication. This medication may cause infertility. Talk to your care team if you are concerned about your fertility. What side effects may I notice from receiving this medication? Side effects that you should report to your care team as soon as possible: Allergic reactions--skin rash, itching, hives, swelling of the face, lips, tongue, or throat Heart attack--pain or tightness in the chest, shoulders, arms, or jaw, nausea, shortness of breath, cold or clammy skin, feeling faint or  lightheaded Heart failure--shortness of breath, swelling of the ankles, feet, or hands, sudden weight gain, unusual weakness or fatigue Heart rhythm changes--fast or irregular heartbeat, dizziness, feeling faint or lightheaded, chest pain, trouble breathing High ammonia level--unusual weakness or fatigue, confusion, loss of appetite, nausea, vomiting, seizures Infection--fever, chills, cough, sore throat, wounds that don't heal, pain or trouble when passing urine, general feeling of discomfort or being unwell Low red blood cell level--unusual weakness or fatigue, dizziness, headache, trouble breathing Pain, tingling, or numbness in the hands or feet, muscle weakness, change in vision, confusion or trouble speaking, loss of balance or coordination, trouble walking, seizures Redness, swelling, and blistering of the skin over hands and feet Severe or prolonged diarrhea Unusual bruising or bleeding Side effects that usually do not require medical attention (report to your care team if  they continue or are bothersome): Dry skin Headache Increased tears Nausea Pain, redness, or swelling with sores inside the mouth or throat Sensitivity to light Vomiting This list may not describe all possible side effects. Call your doctor for medical advice about side effects. You may report side effects to FDA at 1-800-FDA-1088. Where should I keep my medication? This medication is given in a hospital or clinic. It will not be stored at home. NOTE: This sheet is a summary. It may not cover all possible information. If you have questions about this medicine, talk to your doctor, pharmacist, or health care provider.  2024 Elsevier/Gold Standard (2022-02-24 00:00:00)  The chemotherapy medication bag should finish at 46 hours, 96 hours, or 7 days. For example, if your pump is scheduled for 46 hours and it was put on at 4:00 p.m., it should finish at 2:00 p.m. the day it is scheduled to come off regardless of your  appointment time.     Estimated time to finish at 4:30 p.m. on Wednesday 04/21/2023.   If the display on your pump reads "Low Volume" and it is beeping, take the batteries out of the pump and come to the cancer center for it to be taken off.   If the pump alarms go off prior to the pump reading "Low Volume" then call [phone] and someone can assist you.  If the plunger comes out and the chemotherapy medication is leaking out, please use your home chemo spill kit to clean up the spill. Do NOT use paper towels or other household products.  If you have problems or questions regarding your pump, please call either [phone] (24 hours a day) or the cancer center Monday-Friday 8:00 a.m.- 4:30 p.m. at the clinic number and we will assist you. If you are unable to get assistance, then go to the nearest Emergency Department and ask the staff to contact the IV team for assistance.

## 2023-04-19 NOTE — Progress Notes (Signed)
Aaron Roth developed flushing and pruritus at the flank regions about 10 minutes into the oxaliplatin infusion.  Oxaliplatin was stopped, symptoms improved.  He was given Pepcid 20 mg IV.  With complete resolution of symptoms Oxaliplatin was resumed at a slower rate.  He tolerated well until near the end of the infusion when he vomited.  He denies nausea at present.  No pruritus.  No shortness of breath.  No chest pain.  Vital signs are stable.  He appears stable.  We decided to not resume the oxaliplatin and proceeded with the 5-FU pump.  Aaron Roth and his wife were instructed to contact the office with any problems.

## 2023-04-19 NOTE — Progress Notes (Signed)
Hypersensitivity Reaction note  Date of event: 04/19/23  Time of event: 1310  Generic name of drug involved: Oxaliplatin (ELOXATIN).  Name of provider notified of the hypersensitivity reaction: Lonna Cobb, NP  Was agent that likely caused hypersensitivity reaction added to Allergies List within EMR? Yes.  Chain of events including reaction signs/symptoms, treatment administered, and outcome (e.g., drug resumed; drug discontinued; sent to Emergency Department; etc.) 10 minutes into his Oxaliplatin infusion, patient started to complain of itching to his flank area and flushing. Was stopped, IVF started, Misty Stanley, NP notified, Pepcid 20 mg IV administered. Infusion was resumed after symptoms resolved to run slowly over 4 hours. Patient tolerated infusion until about 20 minutes to end of the bag when he started to vomit. He denied any nausea before the vomiting, he denied any itching or SOB. Vitals WNL for patient. Per Misty Stanley, NP, chemo stopped and not to resume at this time. 5FU bolus and [ump administered. Patient denied any concerns or complains at time of discharge. He and his wife know to call the clinic if this changes.  Lorelle Formosa, RN 04/19/2023 6:41 PM

## 2023-04-19 NOTE — Progress Notes (Addendum)
Patient seen by Lonna Cobb NP today  Vitals are within treatment parameters.  Labs reviewed by Lonna Cobb NP and are not all within treatment parameters. K+2.9 and Hgb 7.6. patient refused blood transfusion and taking oral K+ at home.   Per physician team, patient is ready for treatment and there are NO modifications to the treatment plan.

## 2023-04-19 NOTE — Progress Notes (Signed)
[facility] OFFICE PROGRESS NOTE   Diagnosis: Colon cancer  INTERVAL HISTORY:   Mr. Haecker returns as scheduled.  He completed cycle 2 FOLFOX 04/06/2023.  He denies any signs of allergic reaction.  No nausea or vomiting.  No mouth sores.  Periodic loose stools.  He did not experience cold sensitivity.  No numbness or tingling in the hands or feet.  Objective:  Vital signs in last 24 hours:  Blood pressure (!) 163/80, pulse 71, temperature 98.1 F (36.7 C), temperature source Oral, resp. rate 18, height 5\' 7"  (1.702 m), weight 152 lb (68.9 kg), SpO2 100 %.    HEENT: No thrush or ulcers. Resp: Lungs clear bilaterally. Cardio: Regular rate and rhythm. GI: Firm mid abdominal mass.  No hepatomegaly. Vascular: 1+ pitting edema lower leg bilaterally. Skin: Palms without erythema. Port-A-Cath without erythema.  Lab Results:  Lab Results  Component Value Date   WBC 12.8 (H) 04/19/2023   HGB 7.6 (L) 04/19/2023   HCT 26.6 (L) 04/19/2023   MCV 70.7 (L) 04/19/2023   PLT 410 (H) 04/19/2023   NEUTROABS 9.8 (H) 04/19/2023    Imaging:  No results found.  Medications: I have reviewed the patient's current medications.  Assessment/Plan: Colon cancer  CT abdomen/pelvis 08/02/2020-small amount of free fluid in the pelvis, postsurgical change involving the left colon, trace right pleural fluid.   Colonoscopy 08/04/2020-cecal mass as well as a 3 mm polyp in the ascending colon, 9 mm polyp in the ascending colon, 3 mm polyp in the transverse colon and 4 small polyps in the transverse colon.  Biopsy of the cecal mass showed adenocarcinoma.  Pathology on the polyps showed tubular adenomas with no high-grade dysplasia or malignancy identified. CT chest 08/04/2020-no evidence of metastatic disease CEA 08/04/2020-2.0 Laparoscopic right colectomy 10/17/2020 by Dr. Nigel Mormon showed adenocarcinoma with mucinous features, moderately differentiated, 6.7 cm; no carcinoma identified and  23 lymph nodes; margins uninvolved; no macroscopic tumor perforation identified; tumor invaded through muscularis propria into pericolorectal tissue; no lymphovascular invasion and no perineural invasion, pT3, pN0, mismatch repair protein IHC normal, MSI stable.   06/18/2022-CT abdomen/pelvis-homogenous soft tissue mass in the anterior abdomen deep to the umbilicus, 5 mm left lower lobe nodule, new from October 2021 11/04/2022 biopsy of abdominal mass-metastatic mucinous adenocarcinoma, mismatch repair protein IHC normal, foundation 1 pending CTs 11/18/2022-large mass central lower abdomen is increased in size.  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  No evidence of obstruction.  Irregular lesions right lobe of the liver likely new, compatible with metastatic disease.  Increase size of a solid left lower lobe pulmonary nodule likely due to metastatic disease.  Solid pulmonary nodule left upper lobe increased in size likely due to indolent primary lung malignancy. Foundation 1 testing (date of collection 10/17/2020, specimen received 11/24/2022)-microsatellite status is equivocal; tumor mutation burden 6; K-ras wild-type; NRAS G12S; BRAF G466A Cycle 1 FOLFIRI 12/07/2022 Chemotherapy held 12/21/2022 due to neutropenia Chemotherapy held 12/28/2022 per patient due to upcoming dental extractions Cycle 2 FOLFIRI 01/11/2023, irinotecan dose reduced secondary to diarrhea neutropenia, Udenyca Cycle 3 FOLFIRI 01/25/2023 Cycle 4 FOLFIRI 02/08/2023 Cycle 5 FOLFIRI 02/22/2023 CTs 02/24/2023-progression of liver metastases; similar to minimal enlargement of dominant anterior abdominopelvic wall mass with 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, new trace perihepatic ascites Cycle 1 FOLFOX 03/15/2023 Cycle 2 FOLFOX 04/06/2023 Cycle 3 FOLFOX 04/19/2023 Iron deficiency anemia October 2021  08/02/2020 hemoglobin 3.8, MCV  56,  ferritin 3, stool positive for occult blood 10/19/2020 hemoglobin 7.3, MCV 74 11/04/2022 hemoglobin 4.5 11/05/2022 ferritin 3 Ferrous sulfate 325 mg twice daily recommended 11/06/2022 2 units packed red blood cells 11/17/2022 stool cards positive for blood 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 He completed cycle 1 CAPOX beginning 11/13/2011; cycle 7 beginning 03/23/2012. Cycle 8 was completed beginning on 04/13/2012 without oxaliplatin. Restaging CT scans 06/06/2012 without evidence of recurrent disease.   Family history-brother with colon cancer Multiple polyps on colonoscopy 08/04/2020  Port-A-Cath placement 12/04/2022, Interventional Radiology      Disposition: Mr. Laidig appears unchanged.  He has completed 2 cycles of FOLFOX.  Plan to proceed with cycle 3 today as scheduled.  CBC reviewed.  Blood counts are adequate for chemotherapy.  He has stable anemia.  He does not feel he needs a red blood cell transfusion.  He will return for follow-up in 2 weeks.  He will contact the office in the interim with any problems.    Lonna Cobb ANP/GNP-BC   04/19/2023  10:39 AM

## 2023-04-20 ENCOUNTER — Telehealth: Payer: Self-pay

## 2023-04-20 ENCOUNTER — Telehealth: Payer: Self-pay | Admitting: Oncology

## 2023-04-20 NOTE — Telephone Encounter (Signed)
Telephone call to patient this morning at 0845, spoke with patient's wife. She reported that patient is doing well and has not had any itching or vomiting since after his treatment the previous day. She knows to call the clinic if this changes or with any concerns or questions. Patient's spouse was also informed that his upcoming treatment appointment times and duration would be adjusted to accommodate an extended infusion time and that a scheduler will be getting in touch with them. She was agreeable and verbalized understanding.

## 2023-04-20 NOTE — Telephone Encounter (Signed)
Contacted patient to scheduled appointments. Left message with appointment details and a call back number if patient had any questions or could not accommodate the time we provided.   

## 2023-04-21 ENCOUNTER — Inpatient Hospital Stay: Payer: Self-pay

## 2023-04-21 VITALS — BP 151/73 | HR 71 | Temp 98.1°F | Resp 18

## 2023-04-21 DIAGNOSIS — C182 Malignant neoplasm of ascending colon: Secondary | ICD-10-CM

## 2023-04-21 MED ORDER — PEGFILGRASTIM-CBQV 6 MG/0.6ML ~~LOC~~ SOSY
6.0000 mg | PREFILLED_SYRINGE | Freq: Once | SUBCUTANEOUS | Status: AC
  Administered 2023-04-21: 6 mg via SUBCUTANEOUS
  Filled 2023-04-21: qty 0.6

## 2023-04-21 MED ORDER — HEPARIN SOD (PORK) LOCK FLUSH 100 UNIT/ML IV SOLN
500.0000 [IU] | Freq: Once | INTRAVENOUS | Status: AC | PRN
  Administered 2023-04-21: 500 [IU]

## 2023-04-21 MED ORDER — SODIUM CHLORIDE 0.9% FLUSH
10.0000 mL | INTRAVENOUS | Status: DC | PRN
  Administered 2023-04-21: 10 mL

## 2023-04-21 NOTE — Patient Instructions (Signed)

## 2023-05-02 ENCOUNTER — Other Ambulatory Visit: Payer: Self-pay | Admitting: Oncology

## 2023-05-03 ENCOUNTER — Inpatient Hospital Stay: Payer: Self-pay | Admitting: Nurse Practitioner

## 2023-05-03 ENCOUNTER — Inpatient Hospital Stay: Payer: Self-pay

## 2023-05-04 ENCOUNTER — Inpatient Hospital Stay: Payer: Self-pay | Attending: Oncology

## 2023-05-04 ENCOUNTER — Inpatient Hospital Stay (HOSPITAL_BASED_OUTPATIENT_CLINIC_OR_DEPARTMENT_OTHER): Payer: Self-pay | Admitting: Nurse Practitioner

## 2023-05-04 ENCOUNTER — Encounter: Payer: Self-pay | Admitting: Nurse Practitioner

## 2023-05-04 ENCOUNTER — Telehealth: Payer: Self-pay

## 2023-05-04 ENCOUNTER — Other Ambulatory Visit: Payer: Self-pay | Admitting: *Deleted

## 2023-05-04 ENCOUNTER — Inpatient Hospital Stay: Payer: Self-pay

## 2023-05-04 DIAGNOSIS — C182 Malignant neoplasm of ascending colon: Secondary | ICD-10-CM

## 2023-05-04 DIAGNOSIS — D509 Iron deficiency anemia, unspecified: Secondary | ICD-10-CM | POA: Insufficient documentation

## 2023-05-04 DIAGNOSIS — R200 Anesthesia of skin: Secondary | ICD-10-CM | POA: Insufficient documentation

## 2023-05-04 DIAGNOSIS — R202 Paresthesia of skin: Secondary | ICD-10-CM | POA: Insufficient documentation

## 2023-05-04 DIAGNOSIS — Z800 Family history of malignant neoplasm of digestive organs: Secondary | ICD-10-CM | POA: Insufficient documentation

## 2023-05-04 DIAGNOSIS — R197 Diarrhea, unspecified: Secondary | ICD-10-CM | POA: Insufficient documentation

## 2023-05-04 DIAGNOSIS — Z933 Colostomy status: Secondary | ICD-10-CM | POA: Insufficient documentation

## 2023-05-04 DIAGNOSIS — C180 Malignant neoplasm of cecum: Secondary | ICD-10-CM | POA: Insufficient documentation

## 2023-05-04 DIAGNOSIS — C787 Secondary malignant neoplasm of liver and intrahepatic bile duct: Secondary | ICD-10-CM | POA: Insufficient documentation

## 2023-05-04 DIAGNOSIS — Z5189 Encounter for other specified aftercare: Secondary | ICD-10-CM | POA: Insufficient documentation

## 2023-05-04 DIAGNOSIS — Z5111 Encounter for antineoplastic chemotherapy: Secondary | ICD-10-CM | POA: Insufficient documentation

## 2023-05-04 LAB — CMP (CANCER CENTER ONLY)
ALT: 5 U/L (ref 0–44)
AST: 12 U/L — ABNORMAL LOW (ref 15–41)
Albumin: 3.1 g/dL — ABNORMAL LOW (ref 3.5–5.0)
Alkaline Phosphatase: 139 U/L — ABNORMAL HIGH (ref 38–126)
Anion gap: 7 (ref 5–15)
BUN: 11 mg/dL (ref 8–23)
CO2: 32 mmol/L (ref 22–32)
Calcium: 8.5 mg/dL — ABNORMAL LOW (ref 8.9–10.3)
Chloride: 100 mmol/L (ref 98–111)
Creatinine: 0.85 mg/dL (ref 0.61–1.24)
GFR, Estimated: >60 mL/min (ref >60)
Glucose, Bld: 97 mg/dL (ref 70–99)
Potassium: 3 mmol/L — ABNORMAL LOW (ref 3.5–5.1)
Sodium: 139 mmol/L (ref 135–145)
Total Bilirubin: 0.2 mg/dL — ABNORMAL LOW (ref 0.3–1.2)
Total Protein: 6 g/dL — ABNORMAL LOW (ref 6.5–8.1)

## 2023-05-04 LAB — CBC WITH DIFFERENTIAL (CANCER CENTER ONLY)
Abs Immature Granulocytes: 0.25 10*3/uL — ABNORMAL HIGH (ref 0.00–0.07)
Basophils Absolute: 0.1 10*3/uL (ref 0.0–0.1)
Basophils Relative: 1 %
Eosinophils Absolute: 0.1 10*3/uL (ref 0.0–0.5)
Eosinophils Relative: 1 %
HCT: 28.6 % — ABNORMAL LOW (ref 39.0–52.0)
Hemoglobin: 8.2 g/dL — ABNORMAL LOW (ref 13.0–17.0)
Immature Granulocytes: 2 %
Lymphocytes Relative: 17 %
Lymphs Abs: 1.9 10*3/uL (ref 0.7–4.0)
MCH: 20.1 pg — ABNORMAL LOW (ref 26.0–34.0)
MCHC: 28.7 g/dL — ABNORMAL LOW (ref 30.0–36.0)
MCV: 70.1 fL — ABNORMAL LOW (ref 80.0–100.0)
Monocytes Absolute: 0.8 10*3/uL (ref 0.1–1.0)
Monocytes Relative: 8 %
Neutro Abs: 7.7 10*3/uL (ref 1.7–7.7)
Neutrophils Relative %: 71 %
Platelet Count: 369 10*3/uL (ref 150–400)
RBC: 4.08 MIL/uL — ABNORMAL LOW (ref 4.22–5.81)
RDW: 23.9 % — ABNORMAL HIGH (ref 11.5–15.5)
WBC Count: 10.8 10*3/uL — ABNORMAL HIGH (ref 4.0–10.5)
nRBC: 0 % (ref 0.0–0.2)

## 2023-05-04 LAB — CEA (ACCESS): CEA (CHCC): 8.31 ng/mL — ABNORMAL HIGH (ref 0.00–5.00)

## 2023-05-04 NOTE — Progress Notes (Signed)
[facility] OFFICE PROGRESS NOTE   Diagnosis: Colon cancer  INTERVAL HISTORY:   Aaron Roth returns as scheduled.  He completed cycle 3 FOLFOX 04/19/2023.  He developed flushing and pruritus at the flank regions about 10 minutes into the oxaliplatin infusion.  Oxaliplatin was stopped with improvement in symptoms.  He was given Pepcid.  Symptoms completely resolved with oxaliplatin resumed at a slower rate.  Tolerated well until near the end of the infusion when he vomited.  He was otherwise stable.  The oxaliplatin infusion was not resumed.  We proceeded with the 5-FU pump.  He had no more episodes of nausea/vomiting following the one-time incident in the office with the last treatment.  No mouth sores.  He has occasional diarrhea.  Cold sensitivity length varies.  He has mild intermittent numbness/tingling in the fingertips and toes.  In general symptoms do not interfere with activity.  Objective:  Vital signs in last 24 hours:  Blood pressure (!) 161/77, pulse 67, temperature 98.1 F (36.7 C), temperature source Oral, resp. rate 18, height 5\' 7"  (1.702 m), weight 152 lb 6.4 oz (69.1 kg), SpO2 99 %.    HEENT: No thrush or ulcers. Resp: Lungs clear bilaterally. Cardio: Regular rate and rhythm. GI: Firm mass mid abdomen.  No hepatomegaly. Vascular: Pitting edema lower leg bilaterally. Neuro: Vibratory sense intact over the fingertips bilaterally per tuning fork exam. Skin: Palms without erythema. Port-A-Cath without erythema.  Lab Results:  Lab Results  Component Value Date   WBC 10.8 (H) 05/04/2023   HGB 8.2 (L) 05/04/2023   HCT 28.6 (L) 05/04/2023   MCV 70.1 (L) 05/04/2023   PLT 369 05/04/2023   NEUTROABS 7.7 05/04/2023    Imaging:  No results found.  Medications: I have reviewed the patient's current medications.  Assessment/Plan: Colon cancer  CT abdomen/pelvis 08/02/2020-small amount of free fluid in the pelvis, postsurgical change involving the left  colon, trace right pleural fluid.   Colonoscopy 08/04/2020-cecal mass as well as a 3 mm polyp in the ascending colon, 9 mm polyp in the ascending colon, 3 mm polyp in the transverse colon and 4 small polyps in the transverse colon.  Biopsy of the cecal mass showed adenocarcinoma.  Pathology on the polyps showed tubular adenomas with no high-grade dysplasia or malignancy identified. CT chest 08/04/2020-no evidence of metastatic disease CEA 08/04/2020-2.0 Laparoscopic right colectomy 10/17/2020 by Dr. Nigel Mormon showed adenocarcinoma with mucinous features, moderately differentiated, 6.7 cm; no carcinoma identified and 23 lymph nodes; margins uninvolved; no macroscopic tumor perforation identified; tumor invaded through muscularis propria into pericolorectal tissue; no lymphovascular invasion and no perineural invasion, pT3, pN0, mismatch repair protein IHC normal, MSI stable.   06/18/2022-CT abdomen/pelvis-homogenous soft tissue mass in the anterior abdomen deep to the umbilicus, 5 mm left lower lobe nodule, new from October 2021 11/04/2022 biopsy of abdominal mass-metastatic mucinous adenocarcinoma, mismatch repair protein IHC normal, foundation 1 pending CTs 11/18/2022-large mass central lower abdomen is increased in size.  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  No evidence of obstruction.  Irregular lesions right lobe of the liver likely new, compatible with metastatic disease.  Increase size of a solid left lower lobe pulmonary nodule likely due to metastatic disease.  Solid pulmonary nodule left upper lobe increased in size likely due to indolent primary lung malignancy. Foundation 1 testing (date of collection 10/17/2020, specimen received 11/24/2022)-microsatellite status is equivocal; tumor mutation  burden 6; K-ras wild-type; NRAS G12S; BRAF G466A Cycle 1 FOLFIRI 12/07/2022 Chemotherapy held 12/21/2022  due to neutropenia Chemotherapy held 12/28/2022 per patient due to upcoming dental extractions Cycle 2 FOLFIRI 01/11/2023, irinotecan dose reduced secondary to diarrhea neutropenia, Udenyca Cycle 3 FOLFIRI 01/25/2023 Cycle 4 FOLFIRI 02/08/2023 Cycle 5 FOLFIRI 02/22/2023 CTs 02/24/2023-progression of liver metastases; similar to minimal enlargement of dominant anterior abdominopelvic wall mass with 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, new trace perihepatic ascites Cycle 1 FOLFOX 03/15/2023 Cycle 2 FOLFOX 04/06/2023 Cycle 3 FOLFOX 04/19/2023 Cycle 4 FOLFOX 05/05/2023, oxaliplatin diluted in a larger volume and infusion time extended, premedications adjusted due to pruritus during cycle 3 Iron deficiency anemia October 2021  08/02/2020 hemoglobin 3.8, MCV 56, ferritin 3, stool positive for occult blood 10/19/2020 hemoglobin 7.3, MCV 74 11/04/2022 hemoglobin 4.5 11/05/2022 ferritin 3 Ferrous sulfate 325 mg twice daily recommended 11/06/2022 2 units packed red blood cells 11/17/2022 stool cards positive for blood 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 He completed cycle 1 CAPOX beginning 11/13/2011; cycle 7 beginning 03/23/2012. Cycle 8 was completed beginning on 04/13/2012 without oxaliplatin. Restaging CT scans 06/06/2012 without evidence of recurrent disease.   Family history-brother with colon cancer Multiple polyps on colonoscopy 08/04/2020  Port-A-Cath placement 12/04/2022, Interventional Radiology  Disposition: Aaron Roth appears unchanged.  He has completed 3 cycles of FOLFOX.  During the oxaliplatin infusion with cycle 3 he experienced pruritus at the flank regions, no rash.  Oxaliplatin was briefly interrupted, he received Pepcid.  He tolerated oxaliplatin well until near the completion of the infusion when he had an episode of vomiting.  We discussed strategies we can implement an  attempt to decrease the chance of a reaction.  This includes diluting oxaliplatin in a larger volume of fluid, increasing the infusion time and adjusting premedications.  He agrees with this plan but indicates his concern that Pepcid was responsible for the vomiting.  I told him I did not think Pepcid caused the episode of vomiting but it is possible.  He is willing to try Pepcid again as a premedication.  I discussed scheduling a restaging CT scan prior to cycle 5.  He would like to complete 5 cycles prior to the restaging evaluation.  Plan to proceed with cycle 4 FOLFOX 05/05/2023 with adjustments as noted above.  CBC and chemistry panel reviewed.  Labs adequate to proceed with FOLFOX as scheduled.  He has persistent hypokalemia.  We will make sure he is taking K-Dur as prescribed.  He will return for follow-up as scheduled in 2 weeks.    Lonna Cobb ANP/GNP-BC   05/04/2023  11:23 AM

## 2023-05-05 ENCOUNTER — Inpatient Hospital Stay: Payer: Self-pay

## 2023-05-05 VITALS — BP 183/96 | HR 87 | Temp 98.8°F | Resp 18

## 2023-05-05 DIAGNOSIS — C182 Malignant neoplasm of ascending colon: Secondary | ICD-10-CM

## 2023-05-05 MED ORDER — SODIUM CHLORIDE 0.9 % IV SOLN
2000.0000 mg/m2 | INTRAVENOUS | Status: DC
  Administered 2023-05-05: 3500 mg via INTRAVENOUS
  Filled 2023-05-05: qty 70

## 2023-05-05 MED ORDER — PALONOSETRON HCL INJECTION 0.25 MG/5ML
0.2500 mg | Freq: Once | INTRAVENOUS | Status: AC
  Administered 2023-05-05: 0.25 mg via INTRAVENOUS
  Filled 2023-05-05: qty 5

## 2023-05-05 MED ORDER — FAMOTIDINE IN NACL 20-0.9 MG/50ML-% IV SOLN
20.0000 mg | Freq: Once | INTRAVENOUS | Status: AC
  Administered 2023-05-05: 20 mg via INTRAVENOUS
  Filled 2023-05-05: qty 50

## 2023-05-05 MED ORDER — METHYLPREDNISOLONE SODIUM SUCC 125 MG IJ SOLR
125.0000 mg | Freq: Once | INTRAMUSCULAR | Status: AC
  Administered 2023-05-05: 125 mg via INTRAVENOUS
  Filled 2023-05-05: qty 2

## 2023-05-05 MED ORDER — LEUCOVORIN CALCIUM INJECTION 350 MG
400.0000 mg/m2 | Freq: Once | INTRAVENOUS | Status: AC
  Administered 2023-05-05: 732 mg via INTRAVENOUS
  Filled 2023-05-05: qty 36.6

## 2023-05-05 MED ORDER — DEXTROSE 5 % IV SOLN: Freq: Once | INTRAVENOUS | Status: AC

## 2023-05-05 MED ORDER — FLUOROURACIL CHEMO INJECTION 2.5 GM/50ML
400.0000 mg/m2 | Freq: Once | INTRAVENOUS | Status: AC
  Administered 2023-05-05: 750 mg via INTRAVENOUS
  Filled 2023-05-05: qty 15

## 2023-05-05 MED ORDER — OXALIPLATIN CHEMO INJECTION 100 MG/20ML
85.0000 mg/m2 | Freq: Once | INTRAVENOUS | Status: AC
  Administered 2023-05-05: 150 mg via INTRAVENOUS
  Filled 2023-05-05: qty 20

## 2023-05-05 MED ORDER — DIPHENHYDRAMINE HCL 25 MG PO CAPS
50.0000 mg | ORAL_CAPSULE | Freq: Once | ORAL | Status: AC
  Administered 2023-05-05: 50 mg via ORAL
  Filled 2023-05-05: qty 2

## 2023-05-05 NOTE — Patient Instructions (Signed)
[facility]   Discharge Instructions: Thank you for choosing [facility] to provide your oncology and hematology care.   If you have a lab appointment with the [facility], please go directly to the [facility] and check in at the registration area.   Wear comfortable clothing and clothing appropriate for easy access to any Portacath or PICC line.   We strive to give you quality time with your provider. You may need to reschedule your appointment if you arrive late (15 or more minutes).  Arriving late affects you and other patients whose appointments are after yours.  Also, if you miss three or more appointments without notifying the office, you may be dismissed from the clinic at the provider's discretion.      For prescription refill requests, have your pharmacy contact our office and allow 72 hours for refills to be completed.    Today you received the following chemotherapy and/or immunotherapy agents Oxaliplatin (ELOXATIN), Leucovorin & Flourouracil (ADRUCIL).      To help prevent nausea and vomiting after your treatment, we encourage you to take your nausea medication as directed.  BELOW ARE SYMPTOMS THAT SHOULD BE REPORTED IMMEDIATELY: *FEVER GREATER THAN 100.4 F (38 C) OR HIGHER *CHILLS OR SWEATING *NAUSEA AND VOMITING THAT IS NOT CONTROLLED WITH YOUR NAUSEA MEDICATION *UNUSUAL SHORTNESS OF BREATH *UNUSUAL BRUISING OR BLEEDING *URINARY PROBLEMS (pain or burning when urinating, or frequent urination) *BOWEL PROBLEMS (unusual diarrhea, constipation, pain near the anus) TENDERNESS IN MOUTH AND THROAT WITH OR WITHOUT PRESENCE OF ULCERS (sore throat, sores in mouth, or a toothache) UNUSUAL RASH, SWELLING OR PAIN  UNUSUAL VAGINAL DISCHARGE OR ITCHING   Items with * indicate a potential emergency and should be followed up as soon as possible or go to the Emergency Department if any problems should occur.  Please show the  CHEMOTHERAPY ALERT CARD or IMMUNOTHERAPY ALERT CARD at check-in to the Emergency Department and triage nurse.  Should you have questions after your visit or need to cancel or reschedule your appointment, please contact [facility]  Dept: [phone]  and follow the prompts.  Office hours are 8:00 a.m. to 4:30 p.m. Monday - Friday. Please note that voicemails left after 4:00 p.m. may not be returned until the following business day.  We are closed weekends and major holidays. You have access to a nurse at all times for urgent questions. Please call the main number to the clinic Dept: [phone] and follow the prompts.   For any non-urgent questions, you may also contact your provider using MyChart. We now offer e-Visits for anyone 42 and older to request care online for non-urgent symptoms. For details visit [email or web address].   Also download the MyChart app! Go to the app store, search "MyChart", open the app, select [facility], and log in with your MyChart username and password.  Oxaliplatin Injection What is this medication? OXALIPLATIN (ox AL i PLA tin) treats colorectal cancer. It works by slowing down the growth of cancer cells. This medicine may be used for other purposes; ask your health care provider or pharmacist if you have questions. COMMON BRAND NAME(S): Eloxatin What should I tell my care team before I take this medication? They need to know if you have any of these conditions: Heart disease History of irregular heartbeat or rhythm Liver disease Low blood cell levels (white cells, red cells, and platelets) Lung or breathing disease, such as asthma Take medications that treat  or prevent blood clots Tingling of the fingers, toes, or other nerve disorder An unusual or allergic reaction to oxaliplatin, other medications, foods, dyes, or preservatives If you or your partner are pregnant or trying to get pregnant Breast-feeding How should  I use this medication? This medication is injected into a vein. It is given by your care team in a hospital or clinic setting. Talk to your care team about the use of this medication in children. Special care may be needed. Overdosage: If you think you have taken too much of this medicine contact a poison control center or emergency room at once. NOTE: This medicine is only for you. Do not share this medicine with others. What if I miss a dose? Keep appointments for follow-up doses. It is important not to miss a dose. Call your care team if you are unable to keep an appointment. What may interact with this medication? Do not take this medication with any of the following: Cisapride Dronedarone Pimozide Thioridazine This medication may also interact with the following: Aspirin and aspirin-like medications Certain medications that treat or prevent blood clots, such as warfarin, apixaban, dabigatran, and rivaroxaban Cisplatin Cyclosporine Diuretics Medications for infection, such as acyclovir, adefovir, amphotericin B, bacitracin, cidofovir, foscarnet, ganciclovir, gentamicin, pentamidine, vancomycin NSAIDs, medications for pain and inflammation, such as ibuprofen or naproxen Other medications that cause heart rhythm changes Pamidronate Zoledronic acid This list may not describe all possible interactions. Give your health care provider a list of all the medicines, herbs, non-prescription drugs, or dietary supplements you use. Also tell them if you smoke, drink alcohol, or use illegal drugs. Some items may interact with your medicine. What should I watch for while using this medication? Your condition will be monitored carefully while you are receiving this medication. You may need blood work while taking this medication. This medication may make you feel generally unwell. This is not uncommon as chemotherapy can affect healthy cells as well as cancer cells. Report any side effects. Continue  your course of treatment even though you feel ill unless your care team tells you to stop. This medication may increase your risk of getting an infection. Call your care team for advice if you get a fever, chills, sore throat, or other symptoms of a cold or flu. Do not treat yourself. Try to avoid being around people who are sick. Avoid taking medications that contain aspirin, acetaminophen, ibuprofen, naproxen, or ketoprofen unless instructed by your care team. These medications may hide a fever. Be careful brushing or flossing your teeth or using a toothpick because you may get an infection or bleed more easily. If you have any dental work done, tell your dentist you are receiving this medication. This medication can make you more sensitive to cold. Do not drink cold drinks or use ice. Cover exposed skin before coming in contact with cold temperatures or cold objects. When out in cold weather wear warm clothing and cover your mouth and nose to warm the air that goes into your lungs. Tell your care team if you get sensitive to the cold. Talk to your care team if you or your partner are pregnant or think either of you might be pregnant. This medication can cause serious birth defects if taken during pregnancy and for 9 months after the last dose. A negative pregnancy test is required before starting this medication. A reliable form of contraception is recommended while taking this medication and for 9 months after the last dose. Talk to your care  team about effective forms of contraception. Do not father a child while taking this medication and for 6 months after the last dose. Use a condom while having sex during this time period. Do not breastfeed while taking this medication and for 3 months after the last dose. This medication may cause infertility. Talk to your care team if you are concerned about your fertility. What side effects may I notice from receiving this medication? Side effects that you  should report to your care team as soon as possible: Allergic reactions--skin rash, itching, hives, swelling of the face, lips, tongue, or throat Bleeding--bloody or black, tar-like stools, vomiting blood or brown material that looks like coffee grounds, red or dark brown urine, small red or purple spots on skin, unusual bruising or bleeding Dry cough, shortness of breath or trouble breathing Heart rhythm changes--fast or irregular heartbeat, dizziness, feeling faint or lightheaded, chest pain, trouble breathing Infection--fever, chills, cough, sore throat, wounds that don't heal, pain or trouble when passing urine, general feeling of discomfort or being unwell Liver injury--right upper belly pain, loss of appetite, nausea, light-colored stool, dark yellow or brown urine, yellowing skin or eyes, unusual weakness or fatigue Low red blood cell level--unusual weakness or fatigue, dizziness, headache, trouble breathing Muscle injury--unusual weakness or fatigue, muscle pain, dark yellow or brown urine, decrease in amount of urine Pain, tingling, or numbness in the hands or feet Sudden and severe headache, confusion, change in vision, seizures, which may be signs of posterior reversible encephalopathy syndrome (PRES) Unusual bruising or bleeding Side effects that usually do not require medical attention (report to your care team if they continue or are bothersome): Diarrhea Nausea Pain, redness, or swelling with sores inside the mouth or throat Unusual weakness or fatigue Vomiting This list may not describe all possible side effects. Call your doctor for medical advice about side effects. You may report side effects to FDA at 1-800-FDA-1088. Where should I keep my medication? This medication is given in a hospital or clinic. It will not be stored at home. NOTE: This sheet is a summary. It may not cover all possible information. If you have questions about this medicine, talk to your doctor,  pharmacist, or health care provider.  2024 Elsevier/Gold Standard (2022-12-27 00:00:00)  Leucovorin Injection What is this medication? LEUCOVORIN (loo koe VOR in) prevents side effects from certain medications, such as methotrexate. It works by increasing folate levels. This helps protect healthy cells in your body. It may also be used to treat anemia caused by low levels of folate. It can also be used with fluorouracil, a type of chemotherapy, to treat colorectal cancer. It works by increasing the effects of fluorouracil in the body. This medicine may be used for other purposes; ask your health care provider or pharmacist if you have questions. What should I tell my care team before I take this medication? They need to know if you have any of these conditions: Anemia from low levels of vitamin B12 in the blood An unusual or allergic reaction to leucovorin, folic acid, other medications, foods, dyes, or preservatives Pregnant or trying to get pregnant Breastfeeding How should I use this medication? This medication is injected into a vein or a muscle. It is given by your care team in a hospital or clinic setting. Talk to your care team about the use of this medication in children. Special care may be needed. Overdosage: If you think you have taken too much of this medicine contact a poison control center  or emergency room at once. NOTE: This medicine is only for you. Do not share this medicine with others. What if I miss a dose? Keep appointments for follow-up doses. It is important not to miss your dose. Call your care team if you are unable to keep an appointment. What may interact with this medication? Capecitabine Fluorouracil Phenobarbital Phenytoin Primidone Trimethoprim;sulfamethoxazole This list may not describe all possible interactions. Give your health care provider a list of all the medicines, herbs, non-prescription drugs, or dietary supplements you use. Also tell them if you  smoke, drink alcohol, or use illegal drugs. Some items may interact with your medicine. What should I watch for while using this medication? Your condition will be monitored carefully while you are receiving this medication. This medication may increase the side effects of 5-fluorouracil. Tell your care team if you have diarrhea or mouth sores that do not get better or that get worse. What side effects may I notice from receiving this medication? Side effects that you should report to your care team as soon as possible: Allergic reactions--skin rash, itching, hives, swelling of the face, lips, tongue, or throat This list may not describe all possible side effects. Call your doctor for medical advice about side effects. You may report side effects to FDA at 1-800-FDA-1088. Where should I keep my medication? This medication is given in a hospital or clinic. It will not be stored at home. NOTE: This sheet is a summary. It may not cover all possible information. If you have questions about this medicine, talk to your doctor, pharmacist, or health care provider.  2024 Elsevier/Gold Standard (2022-03-24 00:00:00)  Fluorouracil Injection What is this medication? FLUOROURACIL (flure oh YOOR a sil) treats some types of cancer. It works by slowing down the growth of cancer cells. This medicine may be used for other purposes; ask your health care provider or pharmacist if you have questions. COMMON BRAND NAME(S): Adrucil What should I tell my care team before I take this medication? They need to know if you have any of these conditions: Blood disorders Dihydropyrimidine dehydrogenase (DPD) deficiency Infection, such as chickenpox, cold sores, herpes Kidney disease Liver disease Poor nutrition Recent or ongoing radiation therapy An unusual or allergic reaction to fluorouracil, other medications, foods, dyes, or preservatives If you or your partner are pregnant or trying to get  pregnant Breast-feeding How should I use this medication? This medication is injected into a vein. It is administered by your care team in a hospital or clinic setting. Talk to your care team about the use of this medication in children. Special care may be needed. Overdosage: If you think you have taken too much of this medicine contact a poison control center or emergency room at once. NOTE: This medicine is only for you. Do not share this medicine with others. What if I miss a dose? Keep appointments for follow-up doses. It is important not to miss your dose. Call your care team if you are unable to keep an appointment. What may interact with this medication? Do not take this medication with any of the following: Live virus vaccines This medication may also interact with the following: Medications that treat or prevent blood clots, such as warfarin, enoxaparin, dalteparin This list may not describe all possible interactions. Give your health care provider a list of all the medicines, herbs, non-prescription drugs, or dietary supplements you use. Also tell them if you smoke, drink alcohol, or use illegal drugs. Some items may interact with your  medicine. What should I watch for while using this medication? Your condition will be monitored carefully while you are receiving this medication. This medication may make you feel generally unwell. This is not uncommon as chemotherapy can affect healthy cells as well as cancer cells. Report any side effects. Continue your course of treatment even though you feel ill unless your care team tells you to stop. In some cases, you may be given additional medications to help with side effects. Follow all directions for their use. This medication may increase your risk of getting an infection. Call your care team for advice if you get a fever, chills, sore throat, or other symptoms of a cold or flu. Do not treat yourself. Try to avoid being around people who are  sick. This medication may increase your risk to bruise or bleed. Call your care team if you notice any unusual bleeding. Be careful brushing or flossing your teeth or using a toothpick because you may get an infection or bleed more easily. If you have any dental work done, tell your dentist you are receiving this medication. Avoid taking medications that contain aspirin, acetaminophen, ibuprofen, naproxen, or ketoprofen unless instructed by your care team. These medications may hide a fever. Do not treat diarrhea with over the counter products. Contact your care team if you have diarrhea that lasts more than 2 days or if it is severe and watery. This medication can make you more sensitive to the sun. Keep out of the sun. If you cannot avoid being in the sun, wear protective clothing and sunscreen. Do not use sun lamps, tanning beds, or tanning booths. Talk to your care team if you or your partner wish to become pregnant or think you might be pregnant. This medication can cause serious birth defects if taken during pregnancy and for 3 months after the last dose. A reliable form of contraception is recommended while taking this medication and for 3 months after the last dose. Talk to your care team about effective forms of contraception. Do not father a child while taking this medication and for 3 months after the last dose. Use a condom while having sex during this time period. Do not breastfeed while taking this medication. This medication may cause infertility. Talk to your care team if you are concerned about your fertility. What side effects may I notice from receiving this medication? Side effects that you should report to your care team as soon as possible: Allergic reactions--skin rash, itching, hives, swelling of the face, lips, tongue, or throat Heart attack--pain or tightness in the chest, shoulders, arms, or jaw, nausea, shortness of breath, cold or clammy skin, feeling faint or  lightheaded Heart failure--shortness of breath, swelling of the ankles, feet, or hands, sudden weight gain, unusual weakness or fatigue Heart rhythm changes--fast or irregular heartbeat, dizziness, feeling faint or lightheaded, chest pain, trouble breathing High ammonia level--unusual weakness or fatigue, confusion, loss of appetite, nausea, vomiting, seizures Infection--fever, chills, cough, sore throat, wounds that don't heal, pain or trouble when passing urine, general feeling of discomfort or being unwell Low red blood cell level--unusual weakness or fatigue, dizziness, headache, trouble breathing Pain, tingling, or numbness in the hands or feet, muscle weakness, change in vision, confusion or trouble speaking, loss of balance or coordination, trouble walking, seizures Redness, swelling, and blistering of the skin over hands and feet Severe or prolonged diarrhea Unusual bruising or bleeding Side effects that usually do not require medical attention (report to your care team if   they continue or are bothersome): Dry skin Headache Increased tears Nausea Pain, redness, or swelling with sores inside the mouth or throat Sensitivity to light Vomiting This list may not describe all possible side effects. Call your doctor for medical advice about side effects. You may report side effects to FDA at 1-800-FDA-1088. Where should I keep my medication? This medication is given in a hospital or clinic. It will not be stored at home. NOTE: This sheet is a summary. It may not cover all possible information. If you have questions about this medicine, talk to your doctor, pharmacist, or health care provider.  2024 Elsevier/Gold Standard (2022-02-24 00:00:00)  The chemotherapy medication bag should finish at 46 hours, 96 hours, or 7 days. For example, if your pump is scheduled for 46 hours and it was put on at 4:00 p.m., it should finish at 2:00 p.m. the day it is scheduled to come off regardless of your  appointment time.     Estimated time to finish at 12:45 p.m. on Friday 05/07/2023.   If the display on your pump reads "Low Volume" and it is beeping, take the batteries out of the pump and come to the cancer center for it to be taken off.   If the pump alarms go off prior to the pump reading "Low Volume" then call [phone] and someone can assist you.  If the plunger comes out and the chemotherapy medication is leaking out, please use your home chemo spill kit to clean up the spill. Do NOT use paper towels or other household products.  If you have problems or questions regarding your pump, please call either [phone] (24 hours a day) or the cancer center Monday-Friday 8:00 a.m.- 4:30 p.m. at the clinic number and we will assist you. If you are unable to get assistance, then go to the nearest Emergency Department and ask the staff to contact the IV team for assistance.

## 2023-05-07 ENCOUNTER — Inpatient Hospital Stay: Payer: Self-pay

## 2023-05-07 VITALS — BP 152/72 | HR 77 | Temp 98.7°F | Resp 18

## 2023-05-07 DIAGNOSIS — C182 Malignant neoplasm of ascending colon: Secondary | ICD-10-CM

## 2023-05-07 MED ORDER — SODIUM CHLORIDE 0.9% FLUSH
10.0000 mL | INTRAVENOUS | Status: DC | PRN
  Administered 2023-05-07: 10 mL

## 2023-05-07 MED ORDER — PEGFILGRASTIM-CBQV 6 MG/0.6ML ~~LOC~~ SOSY
6.0000 mg | PREFILLED_SYRINGE | Freq: Once | SUBCUTANEOUS | Status: AC
  Administered 2023-05-07: 6 mg via SUBCUTANEOUS
  Filled 2023-05-07: qty 0.6

## 2023-05-07 MED ORDER — HEPARIN SOD (PORK) LOCK FLUSH 100 UNIT/ML IV SOLN
500.0000 [IU] | Freq: Once | INTRAVENOUS | Status: AC | PRN
  Administered 2023-05-07: 500 [IU]

## 2023-05-07 NOTE — Patient Instructions (Signed)

## 2023-05-16 ENCOUNTER — Other Ambulatory Visit: Payer: Self-pay | Admitting: Oncology

## 2023-05-16 DIAGNOSIS — C182 Malignant neoplasm of ascending colon: Secondary | ICD-10-CM

## 2023-05-18 ENCOUNTER — Inpatient Hospital Stay: Payer: Self-pay

## 2023-05-18 ENCOUNTER — Inpatient Hospital Stay: Payer: Self-pay | Admitting: Oncology

## 2023-05-18 ENCOUNTER — Telehealth: Payer: Self-pay

## 2023-05-18 NOTE — Telephone Encounter (Signed)
Spoke with Aaron Roth, he is unable to come to appointments that was scheduled for 05/18/23. We tried several other dates and times and Aaron Roth is unable to come to those appointments. Patient aware it is ok to skip that week of treatment and continue the following week. 05/18/23

## 2023-05-20 ENCOUNTER — Inpatient Hospital Stay: Payer: Self-pay

## 2023-05-24 ENCOUNTER — Ambulatory Visit: Payer: Self-pay | Admitting: Nurse Practitioner

## 2023-05-24 ENCOUNTER — Other Ambulatory Visit: Payer: Self-pay

## 2023-05-25 ENCOUNTER — Ambulatory Visit: Payer: Self-pay

## 2023-05-31 ENCOUNTER — Inpatient Hospital Stay: Payer: Self-pay

## 2023-05-31 ENCOUNTER — Inpatient Hospital Stay: Payer: Self-pay | Admitting: Nurse Practitioner

## 2023-05-31 ENCOUNTER — Telehealth: Payer: Self-pay | Admitting: Nurse Practitioner

## 2023-05-31 ENCOUNTER — Telehealth: Payer: Self-pay | Admitting: *Deleted

## 2023-05-31 DIAGNOSIS — C182 Malignant neoplasm of ascending colon: Secondary | ICD-10-CM

## 2023-05-31 NOTE — Telephone Encounter (Signed)
Aaron Roth called to report they are running "alittle late, maybe 1 hour". Reports he is having some trouble getting going today. Per NP: Needs to reschedule. Scheduler noitfied.

## 2023-05-31 NOTE — Telephone Encounter (Signed)
TC from Pt's wife stating they would be an hour late. Return call to Pt with wife on the phone to inform him he will have to reschedule visit for tomorrow. Pt's wife stated Pt was not feeling well this morning and took some time to get his self together. She also stated Pt is having some stomach cramps and was concerned about him not being able to do treatment. Informed both Pt and his wife. Pt has compazine on his medication list. (Pt confirmed he has that at home.) told Pt to take the compazine. Get some Gatorade or water. And increase his fluid intake. Discussed eliminating dairy products for today. And taking bland foods such as dry toast, tea with no sugar, soup to trial after taking compazine. Pt and his wife verbalized understanding. Informed I will call them back later to see how Pt. Is doing.

## 2023-05-31 NOTE — Telephone Encounter (Signed)
Follow up call to Mr Tzintzun to see how is doing after our initial call this morning. Pt and his wife stated he took the compazine and hasn't vomited since and stomach cramping has gone away. Pt stated he has been drinking the Gatorade which has helped. While on the phone confirmed appointment with Roxan Diesel NP tomorrow and confirmed 1245 appointment. Pt and his wife verbalized understanding.

## 2023-06-01 ENCOUNTER — Inpatient Hospital Stay (HOSPITAL_BASED_OUTPATIENT_CLINIC_OR_DEPARTMENT_OTHER): Payer: Self-pay | Admitting: Nurse Practitioner

## 2023-06-01 ENCOUNTER — Encounter: Payer: Self-pay | Admitting: Nurse Practitioner

## 2023-06-01 ENCOUNTER — Inpatient Hospital Stay: Payer: Self-pay

## 2023-06-01 VITALS — BP 127/70 | HR 73 | Temp 98.2°F | Resp 18 | Ht 67.0 in | Wt 148.0 lb

## 2023-06-01 DIAGNOSIS — C182 Malignant neoplasm of ascending colon: Secondary | ICD-10-CM

## 2023-06-01 DIAGNOSIS — Z95828 Presence of other vascular implants and grafts: Secondary | ICD-10-CM

## 2023-06-01 LAB — CMP (CANCER CENTER ONLY)
ALT: <5 U/L (ref 0–44)
AST: 10 U/L — ABNORMAL LOW (ref 15–41)
Albumin: 2.5 g/dL — ABNORMAL LOW (ref 3.5–5.0)
Alkaline Phosphatase: 75 U/L (ref 38–126)
Anion gap: 5 (ref 5–15)
BUN: 14 mg/dL (ref 8–23)
CO2: 34 mmol/L — ABNORMAL HIGH (ref 22–32)
Calcium: 7.6 mg/dL — ABNORMAL LOW (ref 8.9–10.3)
Chloride: 101 mmol/L (ref 98–111)
Creatinine: 0.67 mg/dL (ref 0.61–1.24)
GFR, Estimated: >60 mL/min (ref >60)
Glucose, Bld: 91 mg/dL (ref 70–99)
Potassium: 3 mmol/L — ABNORMAL LOW (ref 3.5–5.1)
Sodium: 140 mmol/L (ref 135–145)
Total Bilirubin: 0.3 mg/dL (ref 0.3–1.2)
Total Protein: 5.5 g/dL — ABNORMAL LOW (ref 6.5–8.1)

## 2023-06-01 LAB — CBC WITH DIFFERENTIAL (CANCER CENTER ONLY)
Abs Immature Granulocytes: 0.03 10*3/uL (ref 0.00–0.07)
Basophils Absolute: 0.1 10*3/uL (ref 0.0–0.1)
Basophils Relative: 1 %
Eosinophils Absolute: 0.1 10*3/uL (ref 0.0–0.5)
Eosinophils Relative: 1 %
HCT: 27.4 % — ABNORMAL LOW (ref 39.0–52.0)
Hemoglobin: 7.9 g/dL — ABNORMAL LOW (ref 13.0–17.0)
Immature Granulocytes: 0 %
Lymphocytes Relative: 13 %
Lymphs Abs: 1.3 10*3/uL (ref 0.7–4.0)
MCH: 20.6 pg — ABNORMAL LOW (ref 26.0–34.0)
MCHC: 28.8 g/dL — ABNORMAL LOW (ref 30.0–36.0)
MCV: 71.4 fL — ABNORMAL LOW (ref 80.0–100.0)
Monocytes Absolute: 0.7 10*3/uL (ref 0.1–1.0)
Monocytes Relative: 7 %
Neutro Abs: 7.8 10*3/uL — ABNORMAL HIGH (ref 1.7–7.7)
Neutrophils Relative %: 78 %
Platelet Count: 374 10*3/uL (ref 150–400)
RBC: 3.84 MIL/uL — ABNORMAL LOW (ref 4.22–5.81)
RDW: 25.7 % — ABNORMAL HIGH (ref 11.5–15.5)
WBC Count: 10 10*3/uL (ref 4.0–10.5)
nRBC: 0 % (ref 0.0–0.2)

## 2023-06-01 MED ORDER — SODIUM CHLORIDE 0.9% FLUSH
10.0000 mL | INTRAVENOUS | Status: DC | PRN
  Administered 2023-06-01: 10 mL via INTRAVENOUS

## 2023-06-01 MED ORDER — HEPARIN SOD (PORK) LOCK FLUSH 100 UNIT/ML IV SOLN
500.0000 [IU] | Freq: Once | INTRAVENOUS | Status: AC
  Administered 2023-06-01: 500 [IU] via INTRAVENOUS

## 2023-06-01 NOTE — Progress Notes (Signed)
[facility] OFFICE PROGRESS NOTE   Diagnosis: Colon cancer  INTERVAL HISTORY:   Aaron Roth returns for follow-up.  He completed cycle 4 FOLFOX 05/05/2023.  He had an episode of nausea/vomiting yesterday morning.  He denies significant nausea following chemotherapy though did have an episode when the port needle was removed.  No mouth sores.  He has periodic diarrhea.  Cold sensitivity lasted about 3 days.  No numbness or tingling in the absence of cold exposure.  He denies bleeding.  He is not taking iron or oral potassium consistently.  Objective:  Vital signs in last 24 hours:  Blood pressure 127/70, pulse 73, temperature 98.2 F (36.8 C), temperature source Oral, resp. rate 18, height 5\' 7"  (1.702 m), weight 148 lb (67.1 kg), SpO2 100%.    HEENT: No thrush or ulcers. Resp: Lungs clear bilaterally. Cardio: Regular rate and rhythm. GI: Firm mass mid abdomen.  No hepatomegaly. Vascular: Pitting edema lower leg bilaterally. Neuro: Vibratory sense intact over the fingertips bilaterally per tuning fork exam. Port-A-Cath without erythema.  Lab Results:  Lab Results  Component Value Date   WBC 10.0 06/01/2023   HGB 7.9 (L) 06/01/2023   HCT 27.4 (L) 06/01/2023   MCV 71.4 (L) 06/01/2023   PLT 374 06/01/2023   NEUTROABS 7.8 (H) 06/01/2023    Imaging:  No results found.  Medications: I have reviewed the patient's current medications.  Assessment/Plan: Colon cancer  CT abdomen/pelvis 08/02/2020-small amount of free fluid in the pelvis, postsurgical change involving the left colon, trace right pleural fluid.   Colonoscopy 08/04/2020-cecal mass as well as a 3 mm polyp in the ascending colon, 9 mm polyp in the ascending colon, 3 mm polyp in the transverse colon and 4 small polyps in the transverse colon.  Biopsy of the cecal mass showed adenocarcinoma.  Pathology on the polyps showed tubular adenomas with no high-grade dysplasia or malignancy identified. CT chest  08/04/2020-no evidence of metastatic disease CEA 08/04/2020-2.0 Laparoscopic right colectomy 10/17/2020 by Dr. Nigel Mormon showed adenocarcinoma with mucinous features, moderately differentiated, 6.7 cm; no carcinoma identified and 23 lymph nodes; margins uninvolved; no macroscopic tumor perforation identified; tumor invaded through muscularis propria into pericolorectal tissue; no lymphovascular invasion and no perineural invasion, pT3, pN0, mismatch repair protein IHC normal, MSI stable.   06/18/2022-CT abdomen/pelvis-homogenous soft tissue mass in the anterior abdomen deep to the umbilicus, 5 mm left lower lobe nodule, new from October 2021 11/04/2022 biopsy of abdominal mass-metastatic mucinous adenocarcinoma, mismatch repair protein IHC normal, foundation 1 pending CTs 11/18/2022-large mass central lower abdomen is increased in size.  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  No evidence of obstruction.  Irregular lesions right lobe of the liver likely new, compatible with metastatic disease.  Increase size of a solid left lower lobe pulmonary nodule likely due to metastatic disease.  Solid pulmonary nodule left upper lobe increased in size likely due to indolent primary lung malignancy. Foundation 1 testing (date of collection 10/17/2020, specimen received 11/24/2022)-microsatellite status is equivocal; tumor mutation burden 6; K-ras wild-type; NRAS G12S; BRAF [id number] Cycle 1 FOLFIRI 12/07/2022 Chemotherapy held 12/21/2022 due to neutropenia Chemotherapy held 12/28/2022 per patient due to upcoming dental extractions Cycle 2 FOLFIRI 01/11/2023, irinotecan dose reduced secondary to diarrhea neutropenia, Udenyca Cycle 3 FOLFIRI 01/25/2023 Cycle 4 FOLFIRI 02/08/2023 Cycle 5 FOLFIRI 02/22/2023 CTs 02/24/2023-progression of liver metastases; similar to minimal enlargement of dominant anterior abdominopelvic wall  mass  with 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, new trace perihepatic ascites Cycle 1 FOLFOX 03/15/2023 Cycle 2 FOLFOX 04/06/2023 Cycle 3 FOLFOX 04/19/2023 Cycle 4 FOLFOX 05/05/2023, oxaliplatin diluted in a larger volume and infusion time extended, premedications adjusted due to pruritus during cycle 3 Cycle 5 FOLFOX 06/02/2023 Iron deficiency anemia October 2021  08/02/2020 hemoglobin 3.8, MCV 56, ferritin 3, stool positive for occult blood 10/19/2020 hemoglobin 7.3, MCV 74 11/04/2022 hemoglobin 4.5 11/05/2022 ferritin 3 Ferrous sulfate 325 mg twice daily recommended 11/06/2022 2 units packed red blood cells 11/17/2022 stool cards positive for blood 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 He completed cycle 1 CAPOX beginning 11/13/2011; cycle 7 beginning 03/23/2012. Cycle 8 was completed beginning on 04/13/2012 without oxaliplatin. Restaging CT scans 06/06/2012 without evidence of recurrent disease.   Family history-brother with colon cancer Multiple polyps on colonoscopy 08/04/2020  Port-A-Cath placement 12/04/2022, Interventional Radiology  Disposition: Aaron Roth appears unchanged.  He has completed 4 cycles of FOLFOX.  Plan to proceed with cycle 5 as scheduled 06/02/2023, same adjustments as with cycle 4.  He will have restaging CTs prior to his next office visit.  CBC and chemistry panel reviewed.  Labs adequate to proceed with treatment as above.  He has stable anemia.  He declines Red cell transfusion support.  I encouraged him to take oral iron consistently.  He has persistent hypokalemia.  He is not taking the potassium supplement on a regular basis.  I encouraged him to take it as prescribed.  He will return for follow-up in 2 weeks.  We are available to see him sooner if needed.    Lonna Cobb ANP/GNP-BC   06/01/2023  2:05 PM

## 2023-06-02 ENCOUNTER — Inpatient Hospital Stay: Payer: Self-pay

## 2023-06-02 VITALS — BP 153/84 | HR 70 | Temp 97.7°F | Resp 18

## 2023-06-02 DIAGNOSIS — C182 Malignant neoplasm of ascending colon: Secondary | ICD-10-CM

## 2023-06-02 MED ORDER — FAMOTIDINE IN NACL 20-0.9 MG/50ML-% IV SOLN
20.0000 mg | Freq: Once | INTRAVENOUS | Status: AC
  Administered 2023-06-02: 20 mg via INTRAVENOUS
  Filled 2023-06-02: qty 50

## 2023-06-02 MED ORDER — DEXTROSE 5 % IV SOLN: Freq: Once | INTRAVENOUS | Status: DC

## 2023-06-02 MED ORDER — LEUCOVORIN CALCIUM INJECTION 350 MG
400.0000 mg/m2 | Freq: Once | INTRAVENOUS | Status: AC
  Administered 2023-06-02: 732 mg via INTRAVENOUS
  Filled 2023-06-02: qty 36.6

## 2023-06-02 MED ORDER — METHYLPREDNISOLONE SODIUM SUCC 125 MG IJ SOLR
125.0000 mg | Freq: Once | INTRAMUSCULAR | Status: AC
  Administered 2023-06-02: 125 mg via INTRAVENOUS
  Filled 2023-06-02: qty 2

## 2023-06-02 MED ORDER — PALONOSETRON HCL INJECTION 0.25 MG/5ML
0.2500 mg | Freq: Once | INTRAVENOUS | Status: AC
  Administered 2023-06-02: 0.25 mg via INTRAVENOUS
  Filled 2023-06-02: qty 5

## 2023-06-02 MED ORDER — DEXTROSE 5 % IV SOLN: Freq: Once | INTRAVENOUS | Status: AC

## 2023-06-02 MED ORDER — FLUOROURACIL CHEMO INJECTION 2.5 GM/50ML
400.0000 mg/m2 | Freq: Once | INTRAVENOUS | Status: AC
  Administered 2023-06-02: 750 mg via INTRAVENOUS
  Filled 2023-06-02: qty 15

## 2023-06-02 MED ORDER — SODIUM CHLORIDE 0.9 % IV SOLN
2000.0000 mg/m2 | INTRAVENOUS | Status: DC
  Administered 2023-06-02: 3500 mg via INTRAVENOUS
  Filled 2023-06-02: qty 70

## 2023-06-02 MED ORDER — OXALIPLATIN CHEMO INJECTION 100 MG/20ML
85.0000 mg/m2 | Freq: Once | INTRAVENOUS | Status: AC
  Administered 2023-06-02: 150 mg via INTRAVENOUS
  Filled 2023-06-02: qty 20

## 2023-06-02 MED ORDER — DIPHENHYDRAMINE HCL 25 MG PO CAPS
50.0000 mg | ORAL_CAPSULE | Freq: Once | ORAL | Status: AC
  Administered 2023-06-02: 50 mg via ORAL
  Filled 2023-06-02: qty 2

## 2023-06-02 MED ORDER — SODIUM CHLORIDE 0.9% FLUSH: 10.0000 mL | INTRAVENOUS | Status: DC | PRN

## 2023-06-02 NOTE — Patient Instructions (Addendum)
[facility]   Discharge Instructions: Thank you for choosing [facility] to provide your oncology and hematology care.   If you have a lab appointment with the Cancer Center, please go directly to the Cancer Center and check in at the registration area.   Wear comfortable clothing and clothing appropriate for easy access to any Portacath or PICC line.   We strive to give you quality time with your provider. You may need to reschedule your appointment if you arrive late (15 or more minutes).  Arriving late affects you and other patients whose appointments are after yours.  Also, if you miss three or more appointments without notifying the office, you may be dismissed from the clinic at the provider's discretion.      For prescription refill requests, have your pharmacy contact our office and allow 72 hours for refills to be completed.    Today you received the following chemotherapy and/or immunotherapy agents Oxaliplatin (ELOXATIN), Leucovorin & Flourouracil (ADRUCIL).      To help prevent nausea and vomiting after your treatment, we encourage you to take your nausea medication as directed.  BELOW ARE SYMPTOMS THAT SHOULD BE REPORTED IMMEDIATELY: *FEVER GREATER THAN 100.4 F (38 C) OR HIGHER *CHILLS OR SWEATING *NAUSEA AND VOMITING THAT IS NOT CONTROLLED WITH YOUR NAUSEA MEDICATION *UNUSUAL SHORTNESS OF BREATH *UNUSUAL BRUISING OR BLEEDING *URINARY PROBLEMS (pain or burning when urinating, or frequent urination) *BOWEL PROBLEMS (unusual diarrhea, constipation, pain near the anus) TENDERNESS IN MOUTH AND THROAT WITH OR WITHOUT PRESENCE OF ULCERS (sore throat, sores in mouth, or a toothache) UNUSUAL RASH, SWELLING OR PAIN  UNUSUAL VAGINAL DISCHARGE OR ITCHING   Items with * indicate a potential emergency and should be followed up as soon as possible or go to the Emergency Department if any problems should occur.  Please show the  CHEMOTHERAPY ALERT CARD or IMMUNOTHERAPY ALERT CARD at check-in to the Emergency Department and triage nurse.  Should you have questions after your visit or need to cancel or reschedule your appointment, please contact [facility]  Dept: [phone]  and follow the prompts.  Office hours are 8:00 a.m. to 4:30 p.m. Monday - Friday. Please note that voicemails left after 4:00 p.m. may not be returned until the following business day.  We are closed weekends and major holidays. You have access to a nurse at all times for urgent questions. Please call the main number to the clinic Dept: [phone] and follow the prompts.   For any non-urgent questions, you may also contact your provider using MyChart. We now offer e-Visits for anyone 37 and older to request care online for non-urgent symptoms. For details visit [email or web address].   Also download the MyChart app! Go to the app store, search "MyChart", open the app, select [place], and log in with your MyChart username and password.  Oxaliplatin Injection What is this medication? OXALIPLATIN (ox AL i PLA tin) treats colorectal cancer. It works by slowing down the growth of cancer cells. This medicine may be used for other purposes; ask your health care provider or pharmacist if you have questions. COMMON BRAND NAME(S): Eloxatin What should I tell my care team before I take this medication? They need to know if you have any of these conditions: Heart disease History of irregular heartbeat or rhythm Liver disease Low blood cell levels (white cells, red cells, and platelets) Lung or breathing disease, such as asthma Take medications that treat  or prevent blood clots Tingling of the fingers, toes, or other nerve disorder An unusual or allergic reaction to oxaliplatin, other medications, foods, dyes, or preservatives If you or your partner are pregnant or trying to get pregnant Breast-feeding How should  I use this medication? This medication is injected into a vein. It is given by your care team in a hospital or clinic setting. Talk to your care team about the use of this medication in children. Special care may be needed. Overdosage: If you think you have taken too much of this medicine contact a poison control center or emergency room at once. NOTE: This medicine is only for you. Do not share this medicine with others. What if I miss a dose? Keep appointments for follow-up doses. It is important not to miss a dose. Call your care team if you are unable to keep an appointment. What may interact with this medication? Do not take this medication with any of the following: Cisapride Dronedarone Pimozide Thioridazine This medication may also interact with the following: Aspirin and aspirin-like medications Certain medications that treat or prevent blood clots, such as warfarin, apixaban, dabigatran, and rivaroxaban Cisplatin Cyclosporine Diuretics Medications for infection, such as acyclovir, adefovir, amphotericin B, bacitracin, cidofovir, foscarnet, ganciclovir, gentamicin, pentamidine, vancomycin NSAIDs, medications for pain and inflammation, such as ibuprofen or naproxen Other medications that cause heart rhythm changes Pamidronate Zoledronic acid This list may not describe all possible interactions. Give your health care provider a list of all the medicines, herbs, non-prescription drugs, or dietary supplements you use. Also tell them if you smoke, drink alcohol, or use illegal drugs. Some items may interact with your medicine. What should I watch for while using this medication? Your condition will be monitored carefully while you are receiving this medication. You may need blood work while taking this medication. This medication may make you feel generally unwell. This is not uncommon as chemotherapy can affect healthy cells as well as cancer cells. Report any side effects. Continue  your course of treatment even though you feel ill unless your care team tells you to stop. This medication may increase your risk of getting an infection. Call your care team for advice if you get a fever, chills, sore throat, or other symptoms of a cold or flu. Do not treat yourself. Try to avoid being around people who are sick. Avoid taking medications that contain aspirin, acetaminophen, ibuprofen, naproxen, or ketoprofen unless instructed by your care team. These medications may hide a fever. Be careful brushing or flossing your teeth or using a toothpick because you may get an infection or bleed more easily. If you have any dental work done, tell your dentist you are receiving this medication. This medication can make you more sensitive to cold. Do not drink cold drinks or use ice. Cover exposed skin before coming in contact with cold temperatures or cold objects. When out in cold weather wear warm clothing and cover your mouth and nose to warm the air that goes into your lungs. Tell your care team if you get sensitive to the cold. Talk to your care team if you or your partner are pregnant or think either of you might be pregnant. This medication can cause serious birth defects if taken during pregnancy and for 9 months after the last dose. A negative pregnancy test is required before starting this medication. A reliable form of contraception is recommended while taking this medication and for 9 months after the last dose. Talk to your care  team about effective forms of contraception. Do not father a child while taking this medication and for 6 months after the last dose. Use a condom while having sex during this time period. Do not breastfeed while taking this medication and for 3 months after the last dose. This medication may cause infertility. Talk to your care team if you are concerned about your fertility. What side effects may I notice from receiving this medication? Side effects that you  should report to your care team as soon as possible: Allergic reactions--skin rash, itching, hives, swelling of the face, lips, tongue, or throat Bleeding--bloody or black, tar-like stools, vomiting blood or brown material that looks like coffee grounds, red or dark brown urine, small red or purple spots on skin, unusual bruising or bleeding Dry cough, shortness of breath or trouble breathing Heart rhythm changes--fast or irregular heartbeat, dizziness, feeling faint or lightheaded, chest pain, trouble breathing Infection--fever, chills, cough, sore throat, wounds that don't heal, pain or trouble when passing urine, general feeling of discomfort or being unwell Liver injury--right upper belly pain, loss of appetite, nausea, light-colored stool, dark yellow or brown urine, yellowing skin or eyes, unusual weakness or fatigue Low red blood cell level--unusual weakness or fatigue, dizziness, headache, trouble breathing Muscle injury--unusual weakness or fatigue, muscle pain, dark yellow or brown urine, decrease in amount of urine Pain, tingling, or numbness in the hands or feet Sudden and severe headache, confusion, change in vision, seizures, which may be signs of posterior reversible encephalopathy syndrome (PRES) Unusual bruising or bleeding Side effects that usually do not require medical attention (report to your care team if they continue or are bothersome): Diarrhea Nausea Pain, redness, or swelling with sores inside the mouth or throat Unusual weakness or fatigue Vomiting This list may not describe all possible side effects. Call your doctor for medical advice about side effects. You may report side effects to FDA at 1-800-FDA-1088. Where should I keep my medication? This medication is given in a hospital or clinic. It will not be stored at home. NOTE: This sheet is a summary. It may not cover all possible information. If you have questions about this medicine, talk to your doctor,  pharmacist, or health care provider.  2024 Elsevier/Gold Standard (2022-12-27 00:00:00)  Leucovorin Injection What is this medication? LEUCOVORIN (loo koe VOR in) prevents side effects from certain medications, such as methotrexate. It works by increasing folate levels. This helps protect healthy cells in your body. It may also be used to treat anemia caused by low levels of folate. It can also be used with fluorouracil, a type of chemotherapy, to treat colorectal cancer. It works by increasing the effects of fluorouracil in the body. This medicine may be used for other purposes; ask your health care provider or pharmacist if you have questions. What should I tell my care team before I take this medication? They need to know if you have any of these conditions: Anemia from low levels of vitamin B12 in the blood An unusual or allergic reaction to leucovorin, folic acid, other medications, foods, dyes, or preservatives Pregnant or trying to get pregnant Breastfeeding How should I use this medication? This medication is injected into a vein or a muscle. It is given by your care team in a hospital or clinic setting. Talk to your care team about the use of this medication in children. Special care may be needed. Overdosage: If you think you have taken too much of this medicine contact a poison control center  or emergency room at once. NOTE: This medicine is only for you. Do not share this medicine with others. What if I miss a dose? Keep appointments for follow-up doses. It is important not to miss your dose. Call your care team if you are unable to keep an appointment. What may interact with this medication? Capecitabine Fluorouracil Phenobarbital Phenytoin Primidone Trimethoprim;sulfamethoxazole This list may not describe all possible interactions. Give your health care provider a list of all the medicines, herbs, non-prescription drugs, or dietary supplements you use. Also tell them if you  smoke, drink alcohol, or use illegal drugs. Some items may interact with your medicine. What should I watch for while using this medication? Your condition will be monitored carefully while you are receiving this medication. This medication may increase the side effects of 5-fluorouracil. Tell your care team if you have diarrhea or mouth sores that do not get better or that get worse. What side effects may I notice from receiving this medication? Side effects that you should report to your care team as soon as possible: Allergic reactions--skin rash, itching, hives, swelling of the face, lips, tongue, or throat This list may not describe all possible side effects. Call your doctor for medical advice about side effects. You may report side effects to FDA at 1-800-FDA-1088. Where should I keep my medication? This medication is given in a hospital or clinic. It will not be stored at home. NOTE: This sheet is a summary. It may not cover all possible information. If you have questions about this medicine, talk to your doctor, pharmacist, or health care provider.  2024 Elsevier/Gold Standard (2022-03-24 00:00:00)  Fluorouracil Injection What is this medication? FLUOROURACIL (flure oh YOOR a sil) treats some types of cancer. It works by slowing down the growth of cancer cells. This medicine may be used for other purposes; ask your health care provider or pharmacist if you have questions. COMMON BRAND NAME(S): Adrucil What should I tell my care team before I take this medication? They need to know if you have any of these conditions: Blood disorders Dihydropyrimidine dehydrogenase (DPD) deficiency Infection, such as chickenpox, cold sores, herpes Kidney disease Liver disease Poor nutrition Recent or ongoing radiation therapy An unusual or allergic reaction to fluorouracil, other medications, foods, dyes, or preservatives If you or your partner are pregnant or trying to get  pregnant Breast-feeding How should I use this medication? This medication is injected into a vein. It is administered by your care team in a hospital or clinic setting. Talk to your care team about the use of this medication in children. Special care may be needed. Overdosage: If you think you have taken too much of this medicine contact a poison control center or emergency room at once. NOTE: This medicine is only for you. Do not share this medicine with others. What if I miss a dose? Keep appointments for follow-up doses. It is important not to miss your dose. Call your care team if you are unable to keep an appointment. What may interact with this medication? Do not take this medication with any of the following: Live virus vaccines This medication may also interact with the following: Medications that treat or prevent blood clots, such as warfarin, enoxaparin, dalteparin This list may not describe all possible interactions. Give your health care provider a list of all the medicines, herbs, non-prescription drugs, or dietary supplements you use. Also tell them if you smoke, drink alcohol, or use illegal drugs. Some items may interact with your  medicine. What should I watch for while using this medication? Your condition will be monitored carefully while you are receiving this medication. This medication may make you feel generally unwell. This is not uncommon as chemotherapy can affect healthy cells as well as cancer cells. Report any side effects. Continue your course of treatment even though you feel ill unless your care team tells you to stop. In some cases, you may be given additional medications to help with side effects. Follow all directions for their use. This medication may increase your risk of getting an infection. Call your care team for advice if you get a fever, chills, sore throat, or other symptoms of a cold or flu. Do not treat yourself. Try to avoid being around people who are  sick. This medication may increase your risk to bruise or bleed. Call your care team if you notice any unusual bleeding. Be careful brushing or flossing your teeth or using a toothpick because you may get an infection or bleed more easily. If you have any dental work done, tell your dentist you are receiving this medication. Avoid taking medications that contain aspirin, acetaminophen, ibuprofen, naproxen, or ketoprofen unless instructed by your care team. These medications may hide a fever. Do not treat diarrhea with over the counter products. Contact your care team if you have diarrhea that lasts more than 2 days or if it is severe and watery. This medication can make you more sensitive to the sun. Keep out of the sun. If you cannot avoid being in the sun, wear protective clothing and sunscreen. Do not use sun lamps, tanning beds, or tanning booths. Talk to your care team if you or your partner wish to become pregnant or think you might be pregnant. This medication can cause serious birth defects if taken during pregnancy and for 3 months after the last dose. A reliable form of contraception is recommended while taking this medication and for 3 months after the last dose. Talk to your care team about effective forms of contraception. Do not father a child while taking this medication and for 3 months after the last dose. Use a condom while having sex during this time period. Do not breastfeed while taking this medication. This medication may cause infertility. Talk to your care team if you are concerned about your fertility. What side effects may I notice from receiving this medication? Side effects that you should report to your care team as soon as possible: Allergic reactions--skin rash, itching, hives, swelling of the face, lips, tongue, or throat Heart attack--pain or tightness in the chest, shoulders, arms, or jaw, nausea, shortness of breath, cold or clammy skin, feeling faint or  lightheaded Heart failure--shortness of breath, swelling of the ankles, feet, or hands, sudden weight gain, unusual weakness or fatigue Heart rhythm changes--fast or irregular heartbeat, dizziness, feeling faint or lightheaded, chest pain, trouble breathing High ammonia level--unusual weakness or fatigue, confusion, loss of appetite, nausea, vomiting, seizures Infection--fever, chills, cough, sore throat, wounds that don't heal, pain or trouble when passing urine, general feeling of discomfort or being unwell Low red blood cell level--unusual weakness or fatigue, dizziness, headache, trouble breathing Pain, tingling, or numbness in the hands or feet, muscle weakness, change in vision, confusion or trouble speaking, loss of balance or coordination, trouble walking, seizures Redness, swelling, and blistering of the skin over hands and feet Severe or prolonged diarrhea Unusual bruising or bleeding Side effects that usually do not require medical attention (report to your care team if  they continue or are bothersome): Dry skin Headache Increased tears Nausea Pain, redness, or swelling with sores inside the mouth or throat Sensitivity to light Vomiting This list may not describe all possible side effects. Call your doctor for medical advice about side effects. You may report side effects to FDA at 1-800-FDA-1088. Where should I keep my medication? This medication is given in a hospital or clinic. It will not be stored at home. NOTE: This sheet is a summary. It may not cover all possible information. If you have questions about this medicine, talk to your doctor, pharmacist, or health care provider.  2024 Elsevier/Gold Standard (2022-02-24 00:00:00)  The chemotherapy medication bag should finish at 46 hours, 96 hours, or 7 days. For example, if your pump is scheduled for 46 hours and it was put on at 4:00 p.m., it should finish at 2:00 p.m. the day it is scheduled to come off regardless of your  appointment time.     Estimated time to finish at 12:30 p.m. on Friday 06/04/2023.   If the display on your pump reads "Low Volume" and it is beeping, take the batteries out of the pump and come to the cancer center for it to be taken off.   If the pump alarms go off prior to the pump reading "Low Volume" then call [phone] and someone can assist you.  If the plunger comes out and the chemotherapy medication is leaking out, please use your home chemo spill kit to clean up the spill. Do NOT use paper towels or other household products.  If you have problems or questions regarding your pump, please call either [phone] (24 hours a day) or the cancer center Monday-Friday 8:00 a.m.- 4:30 p.m. at the clinic number and we will assist you. If you are unable to get assistance, then go to the nearest Emergency Department and ask the staff to contact the IV team for assistance.

## 2023-06-04 ENCOUNTER — Inpatient Hospital Stay: Payer: Self-pay | Attending: Oncology

## 2023-06-04 VITALS — BP 143/95 | HR 81 | Temp 98.4°F | Resp 18

## 2023-06-04 DIAGNOSIS — I2699 Other pulmonary embolism without acute cor pulmonale: Secondary | ICD-10-CM | POA: Insufficient documentation

## 2023-06-04 DIAGNOSIS — D509 Iron deficiency anemia, unspecified: Secondary | ICD-10-CM | POA: Insufficient documentation

## 2023-06-04 DIAGNOSIS — C180 Malignant neoplasm of cecum: Secondary | ICD-10-CM | POA: Insufficient documentation

## 2023-06-04 DIAGNOSIS — I82411 Acute embolism and thrombosis of right femoral vein: Secondary | ICD-10-CM | POA: Insufficient documentation

## 2023-06-04 DIAGNOSIS — C182 Malignant neoplasm of ascending colon: Secondary | ICD-10-CM

## 2023-06-04 DIAGNOSIS — Z933 Colostomy status: Secondary | ICD-10-CM | POA: Insufficient documentation

## 2023-06-04 DIAGNOSIS — Z800 Family history of malignant neoplasm of digestive organs: Secondary | ICD-10-CM | POA: Insufficient documentation

## 2023-06-04 DIAGNOSIS — Z5189 Encounter for other specified aftercare: Secondary | ICD-10-CM | POA: Insufficient documentation

## 2023-06-04 DIAGNOSIS — C787 Secondary malignant neoplasm of liver and intrahepatic bile duct: Secondary | ICD-10-CM | POA: Insufficient documentation

## 2023-06-04 MED ORDER — SODIUM CHLORIDE 0.9% FLUSH
10.0000 mL | INTRAVENOUS | Status: DC | PRN
  Administered 2023-06-04: 10 mL

## 2023-06-04 MED ORDER — HEPARIN SOD (PORK) LOCK FLUSH 100 UNIT/ML IV SOLN
500.0000 [IU] | Freq: Once | INTRAVENOUS | Status: AC | PRN
  Administered 2023-06-04: 500 [IU]

## 2023-06-04 MED ORDER — PEGFILGRASTIM-CBQV 6 MG/0.6ML ~~LOC~~ SOSY
6.0000 mg | PREFILLED_SYRINGE | Freq: Once | SUBCUTANEOUS | Status: AC
  Administered 2023-06-04: 6 mg via SUBCUTANEOUS
  Filled 2023-06-04: qty 0.6

## 2023-06-04 NOTE — Patient Instructions (Signed)

## 2023-06-08 ENCOUNTER — Inpatient Hospital Stay: Payer: Self-pay | Admitting: Nurse Practitioner

## 2023-06-08 ENCOUNTER — Inpatient Hospital Stay: Payer: Self-pay

## 2023-06-10 ENCOUNTER — Ambulatory Visit (HOSPITAL_BASED_OUTPATIENT_CLINIC_OR_DEPARTMENT_OTHER): Admission: RE | Admit: 2023-06-10 | Payer: Self-pay | Source: Ambulatory Visit

## 2023-06-10 ENCOUNTER — Inpatient Hospital Stay: Payer: Self-pay

## 2023-06-12 ENCOUNTER — Other Ambulatory Visit: Payer: Self-pay | Admitting: Oncology

## 2023-06-12 DIAGNOSIS — C182 Malignant neoplasm of ascending colon: Secondary | ICD-10-CM

## 2023-06-15 ENCOUNTER — Inpatient Hospital Stay: Payer: Self-pay

## 2023-06-15 ENCOUNTER — Inpatient Hospital Stay (HOSPITAL_BASED_OUTPATIENT_CLINIC_OR_DEPARTMENT_OTHER): Payer: Self-pay | Admitting: Oncology

## 2023-06-15 ENCOUNTER — Other Ambulatory Visit: Payer: Self-pay

## 2023-06-15 ENCOUNTER — Telehealth: Payer: Self-pay

## 2023-06-15 VITALS — BP 139/84 | HR 79 | Temp 98.1°F | Resp 20 | Ht 67.0 in | Wt 151.2 lb

## 2023-06-15 DIAGNOSIS — C182 Malignant neoplasm of ascending colon: Secondary | ICD-10-CM

## 2023-06-15 LAB — CBC WITH DIFFERENTIAL (CANCER CENTER ONLY)
Abs Immature Granulocytes: 0.22 10*3/uL — ABNORMAL HIGH (ref 0.00–0.07)
Basophils Absolute: 0.1 10*3/uL (ref 0.0–0.1)
Basophils Relative: 0 %
Eosinophils Absolute: 0 10*3/uL (ref 0.0–0.5)
Eosinophils Relative: 0 %
HCT: 26.4 % — ABNORMAL LOW (ref 39.0–52.0)
Hemoglobin: 7.9 g/dL — ABNORMAL LOW (ref 13.0–17.0)
Immature Granulocytes: 1 %
Lymphocytes Relative: 10 %
Lymphs Abs: 1.8 10*3/uL (ref 0.7–4.0)
MCH: 20.8 pg — ABNORMAL LOW (ref 26.0–34.0)
MCHC: 29.9 g/dL — ABNORMAL LOW (ref 30.0–36.0)
MCV: 69.5 fL — ABNORMAL LOW (ref 80.0–100.0)
Monocytes Absolute: 0.8 10*3/uL (ref 0.1–1.0)
Monocytes Relative: 4 %
Neutro Abs: 15 10*3/uL — ABNORMAL HIGH (ref 1.7–7.7)
Neutrophils Relative %: 85 %
Platelet Count: 229 10*3/uL (ref 150–400)
RBC: 3.8 MIL/uL — ABNORMAL LOW (ref 4.22–5.81)
RDW: 25.2 % — ABNORMAL HIGH (ref 11.5–15.5)
WBC Count: 17.9 10*3/uL — ABNORMAL HIGH (ref 4.0–10.5)
nRBC: 0 % (ref 0.0–0.2)

## 2023-06-15 LAB — CMP (CANCER CENTER ONLY)
ALT: 5 U/L (ref 0–44)
AST: 10 U/L — ABNORMAL LOW (ref 15–41)
Albumin: 2.3 g/dL — ABNORMAL LOW (ref 3.5–5.0)
Alkaline Phosphatase: 141 U/L — ABNORMAL HIGH (ref 38–126)
Anion gap: 5 (ref 5–15)
BUN: 11 mg/dL (ref 8–23)
CO2: 34 mmol/L — ABNORMAL HIGH (ref 22–32)
Calcium: 7.5 mg/dL — ABNORMAL LOW (ref 8.9–10.3)
Chloride: 98 mmol/L (ref 98–111)
Creatinine: 0.74 mg/dL (ref 0.61–1.24)
GFR, Estimated: >60 mL/min (ref >60)
Glucose, Bld: 83 mg/dL (ref 70–99)
Potassium: 2.7 mmol/L — CL (ref 3.5–5.1)
Sodium: 137 mmol/L (ref 135–145)
Total Bilirubin: 0.4 mg/dL (ref 0.3–1.2)
Total Protein: 5.5 g/dL — ABNORMAL LOW (ref 6.5–8.1)

## 2023-06-15 LAB — CEA (ACCESS): CEA (CHCC): 8.2 ng/mL — ABNORMAL HIGH (ref 0.00–5.00)

## 2023-06-15 NOTE — Progress Notes (Signed)
[facility] OFFICE PROGRESS NOTE   Diagnosis: Colon cancer  INTERVAL HISTORY:   Mr. Mashek completed another cycle of FOLFOX on 06/02/2023.  He reports cold sensitivity for a few days following chemotherapy.  No neuropathy symptoms at present.  He feels the abdominal mass is smaller.  He has a poor appetite.  He has not received his dentures.  No bleeding.  He is not taking iron.  Mr. Bartnik did not undergo the scheduled restaging CT.  No symptom of allergic reaction with the last cycle of chemotherapy.  Objective:  Vital signs in last 24 hours:  Blood pressure 139/84, pulse 79, temperature 98.1 F (36.7 C), temperature source Oral, resp. rate 20, height 5\' 7"  (1.702 m), weight 151 lb 3.2 oz (68.6 kg), SpO2 92%.    HEENT: No thrush or ulcers, edentulous Resp: Distant breath sounds, no respiratory distress Cardio: Regular rate and rhythm GI: Firm mobile mid abdominal mass Vascular: 2+ pitting edema below the knee bilaterally Neuro: Mild very mild loss of vibratory sense at the fingertips bilaterally   Portacath/PICC-without erythema  Lab Results:  Lab Results  Component Value Date   WBC 17.9 (H) 06/15/2023   HGB 7.9 (L) 06/15/2023   HCT 26.4 (L) 06/15/2023   MCV 69.5 (L) 06/15/2023   PLT 229 06/15/2023   NEUTROABS 15.0 (H) 06/15/2023    CMP  Lab Results  Component Value Date   NA 140 06/01/2023   K 3.0 (L) 06/01/2023   CL 101 06/01/2023   CO2 34 (H) 06/01/2023   GLUCOSE 91 06/01/2023   BUN 14 06/01/2023   CREATININE 0.67 06/01/2023   CALCIUM 7.6 (L) 06/01/2023   PROT 5.5 (L) 06/01/2023   ALBUMIN 2.5 (L) 06/01/2023   AST 10 (L) 06/01/2023   ALT <5 06/01/2023   ALKPHOS 75 06/01/2023   BILITOT 0.3 06/01/2023   GFRNONAA >60 06/01/2023   GFRAA 80 10/09/2020    Lab Results  Component Value Date   CEA1 2.0 08/04/2020   CEA 8.20 (H) 06/15/2023     Medications: I have reviewed the patient's current medications.   Assessment/Plan: Colon cancer   CT abdomen/pelvis 08/02/2020-small amount of free fluid in the pelvis, postsurgical change involving the left colon, trace right pleural fluid.   Colonoscopy 08/04/2020-cecal mass as well as a 3 mm polyp in the ascending colon, 9 mm polyp in the ascending colon, 3 mm polyp in the transverse colon and 4 small polyps in the transverse colon.  Biopsy of the cecal mass showed adenocarcinoma.  Pathology on the polyps showed tubular adenomas with no high-grade dysplasia or malignancy identified. CT chest 08/04/2020-no evidence of metastatic disease CEA 08/04/2020-2.0 Laparoscopic right colectomy 10/17/2020 by Dr. Nigel Mormon showed adenocarcinoma with mucinous features, moderately differentiated, 6.7 cm; no carcinoma identified and 23 lymph nodes; margins uninvolved; no macroscopic tumor perforation identified; tumor invaded through muscularis propria into pericolorectal tissue; no lymphovascular invasion and no perineural invasion, pT3, pN0, mismatch repair protein IHC normal, MSI stable.   06/18/2022-CT abdomen/pelvis-homogenous soft tissue mass in the anterior abdomen deep to the umbilicus, 5 mm left lower lobe nodule, new from October 2021 11/04/2022 biopsy of abdominal mass-metastatic mucinous adenocarcinoma, mismatch repair protein IHC normal, foundation 1 pending CTs 11/18/2022-large mass central lower abdomen is increased in size.  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  No evidence of obstruction.  Irregular lesions right lobe of the liver likely  new, compatible with metastatic disease.  Increase size of a solid left lower lobe pulmonary nodule likely due to metastatic disease.  Solid pulmonary nodule left upper lobe increased in size likely due to indolent primary lung malignancy. Foundation 1 testing (date of collection 10/17/2020, specimen received 11/24/2022)-microsatellite status is equivocal; tumor  mutation burden 6; K-ras wild-type; NRAS G12S; BRAF [id number] Cycle 1 FOLFIRI 12/07/2022 Chemotherapy held 12/21/2022 due to neutropenia Chemotherapy held 12/28/2022 per patient due to upcoming dental extractions Cycle 2 FOLFIRI 01/11/2023, irinotecan dose reduced secondary to diarrhea neutropenia, Udenyca Cycle 3 FOLFIRI 01/25/2023 Cycle 4 FOLFIRI 02/08/2023 Cycle 5 FOLFIRI 02/22/2023 CTs 02/24/2023-progression of liver metastases; similar to minimal enlargement of dominant anterior abdominopelvic wall mass with 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, new trace perihepatic ascites Cycle 1 FOLFOX 03/15/2023 Cycle 2 FOLFOX 04/06/2023 Cycle 3 FOLFOX 04/19/2023 Cycle 4 FOLFOX 05/05/2023, oxaliplatin diluted in a larger volume and infusion time extended, premedications adjusted due to pruritus during cycle 3 Cycle 5 FOLFOX 06/02/2023 Iron deficiency anemia October 2021  08/02/2020 hemoglobin 3.8, MCV 56, ferritin 3, stool positive for occult blood 10/19/2020 hemoglobin 7.3, MCV 74 11/04/2022 hemoglobin 4.5 11/05/2022 ferritin 3 Ferrous sulfate 325 mg twice daily recommended 11/06/2022 2 units packed red blood cells 11/17/2022 stool cards positive for blood 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 He completed cycle 1 CAPOX beginning 11/13/2011; cycle 7 beginning 03/23/2012. Cycle 8 was completed beginning on 04/13/2012 without oxaliplatin. Restaging CT scans 06/06/2012 without evidence of recurrent disease.   Family history-brother with colon cancer Multiple polyps on colonoscopy 08/04/2020  Port-A-Cath placement 12/04/2022, Interventional Radiology    Disposition: Mr. Rinehimer appears unchanged.  He has completed 5 cycles of FOLFOX.  He is tolerating the chemotherapy well.  There is a persistent large abdominal mass.  Mr. Fosberg has not undergone the scheduled restaging CT evaluation.  He would like to  cancel his chemotherapy scheduled for today.  He agrees to undergo a restaging CT prior to an office visit in 2 weeks.  I encouraged him to take oral iron.  Thornton Papas, MD  06/15/2023  11:06 AM

## 2023-06-15 NOTE — Telephone Encounter (Signed)
Spoke with pt and his wife per MD Truett Perna regarding his potassium levels today. Pt verbalizes understanding and states "I will take my potassium at home". Pt declines to come for IV Potassium at this time.

## 2023-06-15 NOTE — Telephone Encounter (Signed)
CRITICAL VALUE STICKER  CRITICAL VALUE: Potassium 2.7  RECEIVER (on-site recipient of call): Charlann Noss, RN  DATE & TIME NOTIFIED: [id number]  MESSENGER (representative from lab): Marchelle Folks  MD NOTIFIED: MD Truett Perna  TIME OF NOTIFICATION: 1111  RESPONSE: This RN will call and remind pt to take home supply of potassium PO

## 2023-06-16 ENCOUNTER — Inpatient Hospital Stay: Payer: Self-pay

## 2023-06-18 ENCOUNTER — Inpatient Hospital Stay: Payer: Self-pay

## 2023-06-23 ENCOUNTER — Inpatient Hospital Stay: Payer: Self-pay

## 2023-06-23 ENCOUNTER — Encounter: Payer: Self-pay | Admitting: Nurse Practitioner

## 2023-06-23 ENCOUNTER — Other Ambulatory Visit: Payer: Self-pay | Admitting: *Deleted

## 2023-06-23 ENCOUNTER — Encounter (HOSPITAL_BASED_OUTPATIENT_CLINIC_OR_DEPARTMENT_OTHER): Payer: Self-pay

## 2023-06-23 ENCOUNTER — Telehealth: Payer: Self-pay | Admitting: Nurse Practitioner

## 2023-06-23 ENCOUNTER — Ambulatory Visit (HOSPITAL_BASED_OUTPATIENT_CLINIC_OR_DEPARTMENT_OTHER)
Admission: RE | Admit: 2023-06-23 | Discharge: 2023-06-23 | Disposition: A | Discharge status: Home / Self Care | Payer: Self-pay | Source: Ambulatory Visit | Attending: Nurse Practitioner | Admitting: Nurse Practitioner

## 2023-06-23 ENCOUNTER — Other Ambulatory Visit: Payer: Self-pay

## 2023-06-23 ENCOUNTER — Encounter: Payer: Self-pay | Admitting: *Deleted

## 2023-06-23 DIAGNOSIS — Z95828 Presence of other vascular implants and grafts: Secondary | ICD-10-CM

## 2023-06-23 DIAGNOSIS — C182 Malignant neoplasm of ascending colon: Secondary | ICD-10-CM

## 2023-06-23 DIAGNOSIS — I2699 Other pulmonary embolism without acute cor pulmonale: Secondary | ICD-10-CM

## 2023-06-23 LAB — CBC WITH DIFFERENTIAL (CANCER CENTER ONLY)
Abs Immature Granulocytes: 0.08 10*3/uL — ABNORMAL HIGH (ref 0.00–0.07)
Basophils Absolute: 0.1 10*3/uL (ref 0.0–0.1)
Basophils Relative: 1 %
Eosinophils Absolute: 0 10*3/uL (ref 0.0–0.5)
Eosinophils Relative: 0 %
HCT: 27.4 % — ABNORMAL LOW (ref 39.0–52.0)
Hemoglobin: 8.2 g/dL — ABNORMAL LOW (ref 13.0–17.0)
Immature Granulocytes: 1 %
Lymphocytes Relative: 15 %
Lymphs Abs: 2.1 10*3/uL (ref 0.7–4.0)
MCH: 21.5 pg — ABNORMAL LOW (ref 26.0–34.0)
MCHC: 29.9 g/dL — ABNORMAL LOW (ref 30.0–36.0)
MCV: 71.7 fL — ABNORMAL LOW (ref 80.0–100.0)
Monocytes Absolute: 0.6 10*3/uL (ref 0.1–1.0)
Monocytes Relative: 5 %
Neutro Abs: 10.8 10*3/uL — ABNORMAL HIGH (ref 1.7–7.7)
Neutrophils Relative %: 78 %
Platelet Count: 282 10*3/uL (ref 150–400)
RBC: 3.82 MIL/uL — ABNORMAL LOW (ref 4.22–5.81)
RDW: 26.9 % — ABNORMAL HIGH (ref 11.5–15.5)
WBC Count: 13.7 10*3/uL — ABNORMAL HIGH (ref 4.0–10.5)
nRBC: 0 % (ref 0.0–0.2)

## 2023-06-23 LAB — SAMPLE TO BLOOD BANK

## 2023-06-23 MED ORDER — HEPARIN SOD (PORK) LOCK FLUSH 100 UNIT/ML IV SOLN
500.0000 [IU] | Freq: Once | INTRAVENOUS | Status: AC
  Administered 2023-06-23: 500 [IU] via INTRAVENOUS

## 2023-06-23 MED ORDER — ENOXAPARIN SODIUM 60 MG/0.6ML IJ SOSY
60.0000 mg | PREFILLED_SYRINGE | Freq: Once | INTRAMUSCULAR | Status: AC
  Administered 2023-06-23: 60 mg via SUBCUTANEOUS
  Filled 2023-06-23: qty 0.6

## 2023-06-23 MED ORDER — SODIUM CHLORIDE 0.9% FLUSH: 10.0000 mL | INTRAVENOUS | Status: DC | PRN

## 2023-06-23 MED ORDER — IOHEXOL 300 MG/ML  SOLN
100.0000 mL | Freq: Once | INTRAMUSCULAR | Status: AC | PRN
  Administered 2023-06-23: 80 mL via INTRAVENOUS

## 2023-06-23 MED ORDER — SODIUM CHLORIDE 0.9% FLUSH
10.0000 mL | INTRAVENOUS | Status: DC | PRN
  Administered 2023-06-23 (×2): 10 mL via INTRAVENOUS

## 2023-06-23 NOTE — Patient Instructions (Signed)
Enoxaparin Injection What is this medication? ENOXAPARIN (ee nox a PA rin) prevents or treats blood clots. It belongs to a group of medications called blood thinners. This medicine may be used for other purposes; ask your health care provider or pharmacist if you have questions. COMMON BRAND NAME(S): Lovenox What should I tell my care team before I take this medication? They need to know if you have any of these conditions: Bleeding disorder, hemorrhage, or hemophilia Infection of the heart or heart valves Kidney disease Liver disease Previous stroke Prosthetic heart valve Recent surgery Recent delivery of a baby Stomach ulcers, other stomach or intestine problems An unusual or allergic reaction to enoxaparin, heparin, pork or pork products, other medications, foods, dyes, or preservatives Pregnant or trying to get pregnant Breastfeeding How should I use this medication? This medication is injected under the skin. You will be taught how to prepare and give it. For your therapy to work as well as possible, take each dose exactly as prescribed on the prescription label. Do not skip doses. Skipping or stopping this medication can increase your risk of a blood clot. Keep taking this medication unless your care team tells you to stop. It is important that you put your used needles and syringes in a special sharps container. Do not put them in a trash can. If you do not have a sharps container, call your care team to get one. This medication comes with INSTRUCTIONS FOR USE. Ask your pharmacist for directions on how to use this medicine. Read the information carefully. Talk to your care team if you have questions. Talk to your care team about use of this medication in children. Special care may be needed. Overdosage: If you think you have taken too much of this medicine contact a poison control center or emergency room at once. NOTE: This medicine is only for you. Do not share this medicine with  others. What if I miss a dose? If you miss a dose, take it as soon as you can. If it is almost time for your next dose, take only that dose. Do not take double or extra doses. What may interact with this medication? Aspirin and aspirin-like medications Certain medications that prevent or treat blood clots Dipyridamole NSAIDs, medications for pain and inflammation, such as ibuprofen or naproxen This list may not describe all possible interactions. Give your health care provider a list of all the medicines, herbs, non-prescription drugs, or dietary supplements you use. Also tell them if you smoke, drink alcohol, or use illegal drugs. Some items may interact with your medicine. What should I watch for while using this medication? Visit your care team for regular checks on your progress. You may need blood work done while you are taking this medication. Your condition will be monitored carefully while you are receiving this medication. It is important not to miss any appointments. This medication comes in different strengths. Make sure you receive the same strength of medication with each refill. Contact your care team before using this medication if you are given a different strength. If you are going to need surgery or other procedure, tell your care team that you are using this medication. Using this medication for a long time may weaken your bones and increase the risk of bone fractures. Avoid sports and activities that might cause injury while you are using this medication. Severe falls or injuries can cause unseen bleeding. Be careful when using sharp tools or knives. Consider using an Neurosurgeon. Take  special care brushing or flossing your teeth. Report any injuries, bruising, or red spots on the skin to your care team. Wear a medical ID bracelet or chain. Carry a card that describes your disease and details of your medication and dosage times. What side effects may I notice from receiving  this medication? Side effects that you should report to your care team as soon as possible: Allergic reactions--skin rash, itching, hives, swelling of the face, lips, tongue, or throat Bleeding--bloody or black, tar-like stools, vomiting blood or brown material that looks like coffee grounds, red or dark brown urine, small red or purple spots on skin, unusual bruising or bleeding Side effects that usually do not require medical attention (report to your care team if they continue or are bothersome): Pain, redness, or irritation at the injection site Swelling of the ankles, hands, or feet This list may not describe all possible side effects. Call your doctor for medical advice about side effects. You may report side effects to FDA at 1-800-FDA-1088. Where should I keep my medication? Keep out of the reach of children. Store at room temperature between 15 and 30 degrees C (59 and 86 degrees F). Do not freeze. If your injections have been specially prepared, you may need to store them in the refrigerator. Ask your pharmacist. Throw away any unused medication after the expiration date. NOTE: This sheet is a summary. It may not cover all possible information. If you have questions about this medicine, talk to your doctor, pharmacist, or health care provider.  2024 Elsevier/Gold Standard (2022-05-26 00:00:00)

## 2023-06-23 NOTE — Plan of Care (Signed)
Patient's port left accessed and he returned upstairs to cancer center, spoke with Christy  Heparin flushed in CT

## 2023-06-23 NOTE — Patient Instructions (Signed)
 [employer], Adult A central line is a long, thin tube (catheter) that can be used to collect blood for testing or to give medicine through a vein. The tip of the central line ends in a large vein just above the heart (vena cava). A central line may be placed because: You need to get medicines or fluids through an IV for a long period of time. You need nutrition but cannot eat or absorb nutrients. The veins in your hands or arms are difficult to use for IV access. You need a blood transfusion. You need chemotherapy or dialysis. Types of central lines There are four main types of central lines: Peripherally inserted central catheter (PICC) line. This type is used for access of one week or longer. It can be used to draw blood and give fluids or medicines. A PICC looks like an IV tube, but it goes up the arm to the heart. It is usually inserted in the upper arm and taped in place on the arm. Tunneled central line. This type is used for long-term therapy and dialysis. It is placed in a large vein in the neck, chest, or groin. It is inserted through a small incision made over the vein, and then it is advanced to the heart. It is tunneled under the skin and brought out through a second incision. Non-tunneled central line. This type is used for short-term access, usually for a maximum of 7 days. It is often used in the emergency department. It is inserted in the neck, chest, or groin. Implanted port. This type is used for long-term therapy. It can stay in place longer than other types of central lines. It is normally inserted in the upper chest, but it can also be placed in the upper arm or the abdomen. It is inserted and removed with surgery, and it is accessed using a needle. The type of central line that you receive depends on how long you will need it, your medical condition, and the condition of your veins. Tell a health care provider about: Any allergies you have. All medicines you are taking,  including vitamins, herbs, eye drops, creams, and over-the-counter medicines. Any problems you or family members have had with anesthetic medicines. Any blood disorders you have. Any surgeries you have had. Any medical conditions you have. Whether you are pregnant or may be pregnant. What are the risks? Generally, placement and use of a central line is safe. However, problems may occur, including: Infection. A blood clot that blocks the central line or forms in the vein and travels to the heart. Bleeding from the place where the central line was inserted. Developing a hole or crack within the central line. If this happens, the central line will need to be replaced. Central line failure. The catheter moving or coming out of place. What happens before the procedure? Medicines Ask your health care provider about: Changing or stopping your regular medicines. This is especially important if you are taking diabetes medicines or blood thinners. Taking medicines such as aspirin and ibuprofen. These medicines can thin your blood. Do not take these medicines unless your health care provider tells you to take them. Taking over-the-counter medicines, vitamins, herbs, and supplements. General instructions Follow instructions from your health care provider about eating or drinking restrictions. Ask your health care provider: How your procedure site will be marked. What steps will be taken to help prevent infection. These steps may include: Removing hair at the procedure site. Washing skin with a germ-killing soap.  Plan to have a responsible adult take you home from the hospital or clinic. If you will be going home right after the procedure, plan to have a responsible adult care for you for the time you are told. This is important. What happens during the procedure? The procedure will vary depending on the type of central line being placed. In general: An IV will be inserted into one of your  veins. You will be given one or more of the following: A medicine to help you relax (sedative). A medicine to numb the area (local anesthetic). Your skin will be cleaned with a germ-killing (antiseptic) solution, and you may be covered with sterile drapes. Your blood pressure, heart rate, breathing rate, and blood oxygen level will be monitored during the procedure. The central line catheter will be inserted into the vein and advanced to the correct spot. The health care provider may use X-ray equipment to help guide the catheter to the right place. A bandage (dressing) will be placed over the insertion area. The procedure may vary among health care providers and hospitals. What can I expect after the procedure? Your blood pressure, heart rate, breathing rate, and blood oxygen level will be monitored until you leave the hospital or clinic. Antiseptic caps may be placed on the ends of the central line tubing. If you were given a sedative during the procedure, it can affect you for several hours. Do not drive or operate machinery until your health care provider says that it is safe. Follow these instructions at home: Flushing and cleaning the central line  Follow instructions from your health care provider about flushing and cleaning the central line and the area around it. Only use sterile supplies to flush the central line. Use supplies from your health care provider, a pharmacy, or another source that is recommended by your health care provider. Before you flush the central line or clean the central line or the area around it: Wash your hands with soap and water for at least 20 seconds. If soap and water are not available, use alcohol-based hand sanitizer. Clean the central line hub with rubbing alcohol. Unless directed otherwise by the manufacturer's instructions, scrub using a twisting motion and rub for 10 to 15 seconds or for 30 twists. Be sure you scrub the top of the hub, not just the  sides. Never reuse alcohol pads. Let the hub dry before use. Prevent it from touching anything while drying. Caring for the incision or central line site Check your incision or central line site every day for signs of infection. Check for: Redness, swelling, or pain. Fluid or blood. Warmth. Pus or a bad smell. Keep the insertion site of your central line clean and dry at all times. Change your dressing only as told by your health care provider. Keep your dressing dry. If it gets wet, have it changed as soon as possible. General instructions Follow instructions from your health care provider for the type of device that you have. Keep the tube clamped, unless it is being used. If the central line accidentally gets pulled on, make sure: The dressing is okay. There is no bleeding. The line has not been pulled out. Do not use scissors or sharp objects near the tube. Do not take baths, swim, or use a hot tub until your health care provider approves. Ask your health care provider if you may take showers. You may only be allowed to take sponge baths. Ask your health care provider what activities are  safe for you. You may be restricted from lifting or making repetitive arm movements on the side of your central line. Take over-the-counter and prescription medicines only as told by your health care provider. Keep all follow-up visits. This is important. Storage and disposal of supplies Keep your supplies in a clean, dry location. Throw away any used syringes in a disposal container that is meant for sharp items (sharps container). You can buy a sharps container from a pharmacy, or you can make one by using an empty hard plastic bottle with a cover. Place any used dressings or infusion bags into a plastic bag. Throw that bag in the trash. Contact a health care provider if: You have redness, swelling, or pain around your insertion site. You have fluid or blood coming from your insertion site. Your  insertion site feels warm to the touch. You have pus or a bad smell coming from your insertion site. Get help right away if: You have: A fever or chills. Shortness of breath. Chest pain or a racing heartbeat. Swelling in your neck, face, chest, or arm on the side of your central line. You feel dizzy or you faint. Your incision or central line site has red streaks spreading away from the area. Your incision or central line site is bleeding and does not stop. Your central line is difficult to flush or will not flush. You do not get a blood return from the central line. Your central line gets loose or damaged or comes out. Your catheter leaks when flushed or when fluids are infused into it. Summary A central line is a long, thin tube (catheter) that can be used to give medicine through a vein. Follow specific instructions from your health care provider for the type of device that you have. Keep the insertion site of your central line clean and dry at all times. Keep the tube clamped, unless it is being used. This information is not intended to replace advice given to you by your health care provider. Make sure you discuss any questions you have with your health care provider. Document Revised: 06/19/2020 Document Reviewed: 06/20/2020 Elsevier Patient Education  2024 [employer].

## 2023-06-23 NOTE — Progress Notes (Signed)
Called by radiology that per CT scan, [name] has a PE. Per Dr. Truett Perna, he needs to come in for Lovenox injection today. Patient's wife notified by Stephanie Coup and they are on the way back.

## 2023-06-23 NOTE — Telephone Encounter (Signed)
Mr. Savannah had a restaging CT scan earlier today.  He was found to have right lower lobe lobar and segmental pulmonary emboli and right leg DVT.  I discussed these findings with him and his wife in the office today.  We discussed Dr. Kalman Drape recommendation for anticoagulation with Lovenox rather than a DOAC due to possible involvement of the abdominal wall mass with bowel which would increase his risk of bleeding.  He agrees for [place] to administer Lovenox in the office today.  He will return for a follow-up visit tomorrow morning for additional discussion regarding anticoagulation and to discuss the CT scan in more detail.  His vital signs are stable.  He denies shortness of breath and bleeding.

## 2023-06-24 ENCOUNTER — Other Ambulatory Visit: Payer: Self-pay | Admitting: *Deleted

## 2023-06-24 ENCOUNTER — Inpatient Hospital Stay: Payer: Self-pay

## 2023-06-24 ENCOUNTER — Inpatient Hospital Stay (HOSPITAL_BASED_OUTPATIENT_CLINIC_OR_DEPARTMENT_OTHER): Payer: Self-pay | Admitting: Oncology

## 2023-06-24 ENCOUNTER — Encounter: Payer: Self-pay | Admitting: Oncology

## 2023-06-24 ENCOUNTER — Other Ambulatory Visit (HOSPITAL_COMMUNITY): Payer: Self-pay

## 2023-06-24 ENCOUNTER — Other Ambulatory Visit: Payer: Self-pay

## 2023-06-24 ENCOUNTER — Telehealth: Payer: Self-pay

## 2023-06-24 ENCOUNTER — Other Ambulatory Visit (HOSPITAL_BASED_OUTPATIENT_CLINIC_OR_DEPARTMENT_OTHER): Payer: Self-pay

## 2023-06-24 VITALS — BP 125/81 | HR 79 | Temp 98.1°F | Resp 20 | Ht 67.0 in | Wt 152.0 lb

## 2023-06-24 DIAGNOSIS — I2699 Other pulmonary embolism without acute cor pulmonale: Secondary | ICD-10-CM

## 2023-06-24 DIAGNOSIS — C182 Malignant neoplasm of ascending colon: Secondary | ICD-10-CM

## 2023-06-24 MED ORDER — PREDNISONE 10 MG PO TABS
10.0000 mg | ORAL_TABLET | Freq: Every day | ORAL | 3 refills | Status: DC
  Filled 2023-06-24: qty 30, 30d supply, fill #0

## 2023-06-24 MED ORDER — ENOXAPARIN SODIUM 100 MG/ML IJ SOSY
90.0000 mg | PREFILLED_SYRINGE | Freq: Once | INTRAMUSCULAR | Status: AC
  Administered 2023-06-24: 90 mg via SUBCUTANEOUS
  Filled 2023-06-24: qty 1

## 2023-06-24 MED ORDER — POTASSIUM CHLORIDE CRYS ER 10 MEQ PO TBCR
20.0000 meq | EXTENDED_RELEASE_TABLET | Freq: Two times a day (BID) | ORAL | 2 refills | Status: DC
Start: 2023-06-24 — End: 2023-07-20
  Filled 2023-06-24 (×2): qty 60, 15d supply, fill #0

## 2023-06-24 MED ORDER — ENOXAPARIN SODIUM 100 MG/ML IJ SOSY
90.0000 mg | PREFILLED_SYRINGE | INTRAMUSCULAR | 0 refills | Status: DC
  Filled 2023-06-24: qty 7, 7d supply, fill #0

## 2023-06-24 NOTE — Telephone Encounter (Signed)
 Clinical Social Work was referred by medical provider for assessment of psychosocial needs.  CSW attempted to contact patient by phone.  Left voicemail with contact information and request for return call.

## 2023-06-24 NOTE — Progress Notes (Signed)
[facility] OFFICE PROGRESS NOTE   Diagnosis: Colon cancer  INTERVAL HISTORY:   Aaron Roth completed another cycle of FOLFOX on 06/02/2023.  He reports a poor appetite.  He is here with his wife and son.  He has black stool when taking iron.  No blood noted.  He was discovered to have a right sided DVT and pulmonary emboli on a restaging chest CT evaluation yesterday.  He received a dose of Lovenox at the Cancer center.    Objective:  Vital signs in last 24 hours:  Blood pressure 125/81, pulse 79, temperature 98.1 F (36.7 C), temperature source Temporal, resp. rate 20, height 5\' 7"  (1.702 m), weight 152 lb (68.9 kg). Resp: Lungs clear bilaterally Cardio: Regular rate and rhythm GI: No hepatosplenomegaly, large mass occupying the central abdominal wall with a firm area at the right center of the mass Vascular: Edema throughout the right leg and at the left lower leg  Portacath/PICC-without erythema  Lab Results:  Lab Results  Component Value Date   WBC 13.7 (H) 06/23/2023   HGB 8.2 (L) 06/23/2023   HCT 27.4 (L) 06/23/2023   MCV 71.7 (L) 06/23/2023   PLT 282 06/23/2023   NEUTROABS 10.8 (H) 06/23/2023    CMP  Lab Results  Component Value Date   NA 137 06/15/2023   K 2.7 (LL) 06/15/2023   CL 98 06/15/2023   CO2 34 (H) 06/15/2023   GLUCOSE 83 06/15/2023   BUN 11 06/15/2023   CREATININE 0.74 06/15/2023   CALCIUM 7.5 (L) 06/15/2023   PROT 5.5 (L) 06/15/2023   ALBUMIN 2.3 (L) 06/15/2023   AST 10 (L) 06/15/2023   ALT 5 06/15/2023   ALKPHOS 141 (H) 06/15/2023   BILITOT 0.4 06/15/2023   GFRNONAA >60 06/15/2023   GFRAA 80 10/09/2020    Lab Results  Component Value Date   CEA1 2.0 08/04/2020   CEA 8.20 (H) 06/15/2023    Lab Results  Component Value Date   INR 1.2 11/04/2022   LABPROT 15.1 11/04/2022    Imaging:  CT CHEST ABDOMEN PELVIS W CONTRAST  Result Date: 06/23/2023 CLINICAL DATA:  On chemotherapy for metastatic colon cancer.  Ascending colonic primary diagnosed 10/21. Resection. * Tracking Code: BO * EXAM: CT CHEST, ABDOMEN, AND PELVIS WITH CONTRAST TECHNIQUE: Multidetector CT imaging of the chest, abdomen and pelvis was performed following the standard protocol during bolus administration of intravenous contrast. RADIATION DOSE REDUCTION: This exam was performed according to the departmental dose-optimization program which includes automated exposure control, adjustment of the mA and/or kV according to patient size and/or use of iterative reconstruction technique. CONTRAST:  80mL OMNIPAQUE IOHEXOL 300 MG/ML  SOLN COMPARISON:  02/24/2023 FINDINGS: CT CHEST FINDINGS Cardiovascular: Right Port-A-Cath tip superior caval/atrial junction. Aortic atherosclerosis. Tortuous thoracic aorta. Normal heart size with minimal anterior pericardial fluid. Small volume but definite lobar and segmental pulmonary emboli to the right lower lobe including on images 41-43 of series 2. Mediastinum/Nodes: No supraclavicular adenopathy. No mediastinal or hilar adenopathy. Lungs/Pleura: Trace bilateral pleural effusions, new. Mild to moderate centrilobular and paraseptal emphysema. Mild biapical pleuroparenchymal scarring. Left apical index nodule measures 5 mm on 35/4 versus 7 mm on the prior. A lingular nodule measures 5 mm on 105/4 and is not significantly changed. Medial left lower lobe pleural-based nodule measures 9 x 8 mm on 123/4 versus 9 x 7 mm on the prior. More medial left upper lobe nodule measures 7 mm on 40/4 versus 5 mm previously. Musculoskeletal: No acute osseous  abnormality. CT ABDOMEN PELVIS FINDINGS Hepatobiliary: Large volume right-sided hepatic metastasis. High right hepatic lobe mass measures 3.6 x 3.3 cm on 55/2 versus 3.6 x 3.1 cm on the prior. Lower density today, suggesting necrosis. More caudal, dominant posterior right hepatic lobe mass measures 6.4 x 7.7 cm on 64/2 versus 6.0 x 5.8 cm on the prior. Immediately caudal to this, a mass  measures 6.2 x 4.9 cm on 73/2 versus 5.6 x 5.3 cm previously. Normal gallbladder, without biliary ductal dilatation. Pancreas: Normal, without mass or ductal dilatation. Spleen: Normal in size, without focal abnormality. Adrenals/Urinary Tract: Normal adrenal glands. Normal kidneys, without hydronephrosis. Normal urinary bladder. Stomach/Bowel: Normal stomach, without wall thickening. Probable partial right hemicolectomy. No bowel obstruction. The anterior abdominopelvic mass with fistulous communication to small bowel is again identified. Estimated at 14.7 x 9.4 cm on 83/2 versus 13.7 x 8.9 cm on the prior exam. Vascular/Lymphatic: Aortic atherosclerosis. Filling defect within the right common iliac vein on 82/2. Right femoral and external iliac vein nearly occlusive thrombus including on 106/2. No abdominal adenopathy. Upper normal right external iliac node including at 9 mm on 96/2, similar. Reproductive: Normal prostate. Other: Decreased trace pelvic fluid. No free intraperitoneal air. Anasarca. Musculoskeletal: Lumbosacral spondylosis. IMPRESSION: CT CHEST IMPRESSION 1. Right lower lobe lobar and segmental pulmonary emboli. 2. Similar pulmonary nodules. Some measure slightly larger and some slightly smaller. 3. No thoracic adenopathy. 4. New trace bilateral pleural effusions. 5. Aortic atherosclerosis (ICD10-I70.0) and emphysema (ICD10-J43.9). CT ABDOMEN AND PELVIS IMPRESSION 1. 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 2. Mild enlargement of dominant anterior abdominopelvic wall mass with fistulous communication to bowel. 3. Right-sided deep venous thrombosis including within the femoral, external iliac, and common iliac veins. 4. Decrease in trace pelvic fluid. 5. No bowel obstruction or other acute complication. These results will be called to the ordering clinician or representative by the Radiologist Assistant, and communication documented in the PACS or [employer]. Electronically Signed    By: Jeronimo Greaves M.D.   On: 06/23/2023 14:16    Medications: I have reviewed the patient's current medications.   Assessment/Plan: Colon cancer  CT abdomen/pelvis 08/02/2020-small amount of free fluid in the pelvis, postsurgical change involving the left colon, trace right pleural fluid.   Colonoscopy 08/04/2020-cecal mass as well as a 3 mm polyp in the ascending colon, 9 mm polyp in the ascending colon, 3 mm polyp in the transverse colon and 4 small polyps in the transverse colon.  Biopsy of the cecal mass showed adenocarcinoma.  Pathology on the polyps showed tubular adenomas with no high-grade dysplasia or malignancy identified. CT chest 08/04/2020-no evidence of metastatic disease CEA 08/04/2020-2.0 Laparoscopic right colectomy 10/17/2020 by Dr. Nigel Mormon showed adenocarcinoma with mucinous features, moderately differentiated, 6.7 cm; no carcinoma identified and 23 lymph nodes; margins uninvolved; no macroscopic tumor perforation identified; tumor invaded through muscularis propria into pericolorectal tissue; no lymphovascular invasion and no perineural invasion, pT3, pN0, mismatch repair protein IHC normal, MSI stable.   06/18/2022-CT abdomen/pelvis-homogenous soft tissue mass in the anterior abdomen deep to the umbilicus, 5 mm left lower lobe nodule, new from October 2021 11/04/2022 biopsy of abdominal mass-metastatic mucinous adenocarcinoma, mismatch repair protein IHC normal, foundation 1 pending CTs 11/18/2022-large mass central lower abdomen is increased in size.  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  No evidence of obstruction.  Irregular lesions right lobe of the liver likely new, compatible  with metastatic disease.  Increase size of a solid left lower lobe pulmonary nodule likely due to metastatic disease.  Solid pulmonary nodule left upper lobe increased in size likely due to indolent  primary lung malignancy. Foundation 1 testing (date of collection 10/17/2020, specimen received 11/24/2022)-microsatellite status is equivocal; tumor mutation burden 6; K-ras wild-type; NRAS G12S; BRAF G466A Cycle 1 FOLFIRI 12/07/2022 Chemotherapy held 12/21/2022 due to neutropenia Chemotherapy held 12/28/2022 per patient due to upcoming dental extractions Cycle 2 FOLFIRI 01/11/2023, irinotecan dose reduced secondary to diarrhea neutropenia, Udenyca Cycle 3 FOLFIRI 01/25/2023 Cycle 4 FOLFIRI 02/08/2023 Cycle 5 FOLFIRI 02/22/2023 CTs 02/24/2023-progression of liver metastases; similar to minimal enlargement of dominant anterior abdominopelvic wall mass with 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, new trace perihepatic ascites Cycle 1 FOLFOX 03/15/2023 Cycle 2 FOLFOX 04/06/2023 Cycle 3 FOLFOX 04/19/2023 Cycle 4 FOLFOX 05/05/2023, oxaliplatin diluted in a larger volume and infusion time extended, premedications adjusted due to pruritus during cycle 3 Cycle 5 FOLFOX 06/02/2023 CTs 06/23/2023-pulmonary emboli, stable pulmonary nodules, new trace bilateral pleural effusions, mild enlargement of hepatic metastases and the anterior abdominal wall mass, fistulous communication of the mass and bowel Iron deficiency anemia October 2021  08/02/2020 hemoglobin 3.8, MCV 56, ferritin 3, stool positive for occult blood 10/19/2020 hemoglobin 7.3, MCV 74 11/04/2022 hemoglobin 4.5 11/05/2022 ferritin 3 Ferrous sulfate 325 mg twice daily recommended 11/06/2022 2 units packed red blood cells 11/17/2022 stool cards positive for blood 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 He completed cycle 1 CAPOX beginning 11/13/2011; cycle 7 beginning 03/23/2012. Cycle 8 was completed beginning on 04/13/2012 without oxaliplatin. Restaging CT scans 06/06/2012 without evidence of recurrent disease.   Family history-brother with colon  cancer Multiple polyps on colonoscopy 08/04/2020  Port-A-Cath placement 12/04/2022, Interventional Radiology 06/23/2023-right iliac/femoral DVT and pulmonary emboli Lovenox starting 06/23/2023    Disposition: Mr. Thousand has metastatic colon cancer.  He has completed 5 cycles of FOLFOX.  The restaging CTs are consistent with disease progression in the liver and dominant abdominal mass.  I reviewed the CT findings and images with Mr. Jubb and his family.  FOLFOX will be discontinued.  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  Systemic treatment options are limited given the NRAS and BRAF mutations.  We will check into whether he may be a candidate for panitumumab/encorafenib.  We also discussed Lonsurf and fruquintinib.  Mr. Mcrea will continue Lovenox anticoagulation.  We discussed hospice care given his poor performance status and limited treatment options.  He does not wish to consider hospice at present.  He will return for an office visit and further discussion next week.  Thornton Papas, MD  06/24/2023  9:55 AM

## 2023-06-25 ENCOUNTER — Encounter: Payer: Self-pay | Admitting: *Deleted

## 2023-06-25 ENCOUNTER — Other Ambulatory Visit: Payer: Self-pay

## 2023-06-25 NOTE — Progress Notes (Signed)
Faxed completed patient assistance application for Lovenox to [facility] [phone].

## 2023-06-28 ENCOUNTER — Encounter: Payer: Self-pay | Admitting: Nurse Practitioner

## 2023-06-28 ENCOUNTER — Inpatient Hospital Stay (HOSPITAL_BASED_OUTPATIENT_CLINIC_OR_DEPARTMENT_OTHER): Payer: Self-pay | Admitting: Nurse Practitioner

## 2023-06-28 ENCOUNTER — Inpatient Hospital Stay: Payer: Self-pay

## 2023-06-28 VITALS — BP 140/88 | HR 90 | Temp 98.1°F | Resp 18 | Ht 67.0 in | Wt 152.0 lb

## 2023-06-28 DIAGNOSIS — C182 Malignant neoplasm of ascending colon: Secondary | ICD-10-CM

## 2023-06-28 DIAGNOSIS — Z95828 Presence of other vascular implants and grafts: Secondary | ICD-10-CM

## 2023-06-28 LAB — CBC WITH DIFFERENTIAL (CANCER CENTER ONLY)
Abs Immature Granulocytes: 0.05 10*3/uL (ref 0.00–0.07)
Basophils Absolute: 0.1 10*3/uL (ref 0.0–0.1)
Basophils Relative: 1 %
Eosinophils Absolute: 0 10*3/uL (ref 0.0–0.5)
Eosinophils Relative: 0 %
HCT: 28.7 % — ABNORMAL LOW (ref 39.0–52.0)
Hemoglobin: 8.4 g/dL — ABNORMAL LOW (ref 13.0–17.0)
Immature Granulocytes: 1 %
Lymphocytes Relative: 18 %
Lymphs Abs: 1.9 10*3/uL (ref 0.7–4.0)
MCH: 21.5 pg — ABNORMAL LOW (ref 26.0–34.0)
MCHC: 29.3 g/dL — ABNORMAL LOW (ref 30.0–36.0)
MCV: 73.4 fL — ABNORMAL LOW (ref 80.0–100.0)
Monocytes Absolute: 0.6 10*3/uL (ref 0.1–1.0)
Monocytes Relative: 6 %
Neutro Abs: 7.8 10*3/uL — ABNORMAL HIGH (ref 1.7–7.7)
Neutrophils Relative %: 74 %
Platelet Count: 386 10*3/uL (ref 150–400)
RBC: 3.91 MIL/uL — ABNORMAL LOW (ref 4.22–5.81)
RDW: 28.8 % — ABNORMAL HIGH (ref 11.5–15.5)
WBC Count: 10.6 10*3/uL — ABNORMAL HIGH (ref 4.0–10.5)
nRBC: 0 % (ref 0.0–0.2)

## 2023-06-28 LAB — CMP (CANCER CENTER ONLY)
ALT: 8 U/L (ref 0–44)
AST: 15 U/L (ref 15–41)
Albumin: 2 g/dL — ABNORMAL LOW (ref 3.5–5.0)
Alkaline Phosphatase: 136 U/L — ABNORMAL HIGH (ref 38–126)
Anion gap: 6 (ref 5–15)
BUN: 13 mg/dL (ref 8–23)
CO2: 33 mmol/L — ABNORMAL HIGH (ref 22–32)
Calcium: 7 mg/dL — ABNORMAL LOW (ref 8.9–10.3)
Chloride: 100 mmol/L (ref 98–111)
Creatinine: 0.71 mg/dL (ref 0.61–1.24)
GFR, Estimated: >60 mL/min (ref >60)
Glucose, Bld: 73 mg/dL (ref 70–99)
Potassium: 3 mmol/L — ABNORMAL LOW (ref 3.5–5.1)
Sodium: 139 mmol/L (ref 135–145)
Total Bilirubin: 0.3 mg/dL (ref 0.3–1.2)
Total Protein: 5.1 g/dL — ABNORMAL LOW (ref 6.5–8.1)

## 2023-06-28 LAB — CEA (ACCESS): CEA (CHCC): 8.65 ng/mL — ABNORMAL HIGH (ref 0.00–5.00)

## 2023-06-28 LAB — MAGNESIUM: Magnesium: 1.7 mg/dL (ref 1.7–2.4)

## 2023-06-28 MED ORDER — SODIUM CHLORIDE 0.9% FLUSH
10.0000 mL | INTRAVENOUS | Status: DC | PRN
  Administered 2023-06-28: 10 mL via INTRAVENOUS

## 2023-06-28 MED ORDER — HEPARIN SOD (PORK) LOCK FLUSH 100 UNIT/ML IV SOLN
500.0000 [IU] | Freq: Once | INTRAVENOUS | Status: AC
  Administered 2023-06-28: 500 [IU] via INTRAVENOUS

## 2023-06-28 MED ORDER — LONSURF 20-8.19 MG PO TABS
ORAL_TABLET | ORAL | 0 refills | Status: DC
  Filled 2023-06-28: qty 60, fill #0

## 2023-06-28 NOTE — Patient Instructions (Signed)
 [employer], Adult A central line is a long, thin tube (catheter) that can be used to collect blood for testing or to give medicine through a vein. The tip of the central line ends in a large vein just above the heart (vena cava). A central line may be placed because: You need to get medicines or fluids through an IV for a long period of time. You need nutrition but cannot eat or absorb nutrients. The veins in your hands or arms are difficult to use for IV access. You need a blood transfusion. You need chemotherapy or dialysis. Types of central lines There are four main types of central lines: Peripherally inserted central catheter (PICC) line. This type is used for access of one week or longer. It can be used to draw blood and give fluids or medicines. A PICC looks like an IV tube, but it goes up the arm to the heart. It is usually inserted in the upper arm and taped in place on the arm. Tunneled central line. This type is used for long-term therapy and dialysis. It is placed in a large vein in the neck, chest, or groin. It is inserted through a small incision made over the vein, and then it is advanced to the heart. It is tunneled under the skin and brought out through a second incision. Non-tunneled central line. This type is used for short-term access, usually for a maximum of 7 days. It is often used in the emergency department. It is inserted in the neck, chest, or groin. Implanted port. This type is used for long-term therapy. It can stay in place longer than other types of central lines. It is normally inserted in the upper chest, but it can also be placed in the upper arm or the abdomen. It is inserted and removed with surgery, and it is accessed using a needle. The type of central line that you receive depends on how long you will need it, your medical condition, and the condition of your veins. Tell a health care provider about: Any allergies you have. All medicines you are taking,  including vitamins, herbs, eye drops, creams, and over-the-counter medicines. Any problems you or family members have had with anesthetic medicines. Any blood disorders you have. Any surgeries you have had. Any medical conditions you have. Whether you are pregnant or may be pregnant. What are the risks? Generally, placement and use of a central line is safe. However, problems may occur, including: Infection. A blood clot that blocks the central line or forms in the vein and travels to the heart. Bleeding from the place where the central line was inserted. Developing a hole or crack within the central line. If this happens, the central line will need to be replaced. Central line failure. The catheter moving or coming out of place. What happens before the procedure? Medicines Ask your health care provider about: Changing or stopping your regular medicines. This is especially important if you are taking diabetes medicines or blood thinners. Taking medicines such as aspirin and ibuprofen. These medicines can thin your blood. Do not take these medicines unless your health care provider tells you to take them. Taking over-the-counter medicines, vitamins, herbs, and supplements. General instructions Follow instructions from your health care provider about eating or drinking restrictions. Ask your health care provider: How your procedure site will be marked. What steps will be taken to help prevent infection. These steps may include: Removing hair at the procedure site. Washing skin with a germ-killing soap.  Plan to have a responsible adult take you home from the hospital or clinic. If you will be going home right after the procedure, plan to have a responsible adult care for you for the time you are told. This is important. What happens during the procedure? The procedure will vary depending on the type of central line being placed. In general: An IV will be inserted into one of your  veins. You will be given one or more of the following: A medicine to help you relax (sedative). A medicine to numb the area (local anesthetic). Your skin will be cleaned with a germ-killing (antiseptic) solution, and you may be covered with sterile drapes. Your blood pressure, heart rate, breathing rate, and blood oxygen level will be monitored during the procedure. The central line catheter will be inserted into the vein and advanced to the correct spot. The health care provider may use X-ray equipment to help guide the catheter to the right place. A bandage (dressing) will be placed over the insertion area. The procedure may vary among health care providers and hospitals. What can I expect after the procedure? Your blood pressure, heart rate, breathing rate, and blood oxygen level will be monitored until you leave the hospital or clinic. Antiseptic caps may be placed on the ends of the central line tubing. If you were given a sedative during the procedure, it can affect you for several hours. Do not drive or operate machinery until your health care provider says that it is safe. Follow these instructions at home: Flushing and cleaning the central line  Follow instructions from your health care provider about flushing and cleaning the central line and the area around it. Only use sterile supplies to flush the central line. Use supplies from your health care provider, a pharmacy, or another source that is recommended by your health care provider. Before you flush the central line or clean the central line or the area around it: Wash your hands with soap and water for at least 20 seconds. If soap and water are not available, use alcohol-based hand sanitizer. Clean the central line hub with rubbing alcohol. Unless directed otherwise by the manufacturer's instructions, scrub using a twisting motion and rub for 10 to 15 seconds or for 30 twists. Be sure you scrub the top of the hub, not just the  sides. Never reuse alcohol pads. Let the hub dry before use. Prevent it from touching anything while drying. Caring for the incision or central line site Check your incision or central line site every day for signs of infection. Check for: Redness, swelling, or pain. Fluid or blood. Warmth. Pus or a bad smell. Keep the insertion site of your central line clean and dry at all times. Change your dressing only as told by your health care provider. Keep your dressing dry. If it gets wet, have it changed as soon as possible. General instructions Follow instructions from your health care provider for the type of device that you have. Keep the tube clamped, unless it is being used. If the central line accidentally gets pulled on, make sure: The dressing is okay. There is no bleeding. The line has not been pulled out. Do not use scissors or sharp objects near the tube. Do not take baths, swim, or use a hot tub until your health care provider approves. Ask your health care provider if you may take showers. You may only be allowed to take sponge baths. Ask your health care provider what activities are  safe for you. You may be restricted from lifting or making repetitive arm movements on the side of your central line. Take over-the-counter and prescription medicines only as told by your health care provider. Keep all follow-up visits. This is important. Storage and disposal of supplies Keep your supplies in a clean, dry location. Throw away any used syringes in a disposal container that is meant for sharp items (sharps container). You can buy a sharps container from a pharmacy, or you can make one by using an empty hard plastic bottle with a cover. Place any used dressings or infusion bags into a plastic bag. Throw that bag in the trash. Contact a health care provider if: You have redness, swelling, or pain around your insertion site. You have fluid or blood coming from your insertion site. Your  insertion site feels warm to the touch. You have pus or a bad smell coming from your insertion site. Get help right away if: You have: A fever or chills. Shortness of breath. Chest pain or a racing heartbeat. Swelling in your neck, face, chest, or arm on the side of your central line. You feel dizzy or you faint. Your incision or central line site has red streaks spreading away from the area. Your incision or central line site is bleeding and does not stop. Your central line is difficult to flush or will not flush. You do not get a blood return from the central line. Your central line gets loose or damaged or comes out. Your catheter leaks when flushed or when fluids are infused into it. Summary A central line is a long, thin tube (catheter) that can be used to give medicine through a vein. Follow specific instructions from your health care provider for the type of device that you have. Keep the insertion site of your central line clean and dry at all times. Keep the tube clamped, unless it is being used. This information is not intended to replace advice given to you by your health care provider. Make sure you discuss any questions you have with your health care provider. Document Revised: 06/19/2020 Document Reviewed: 06/20/2020 Elsevier Patient Education  2024 [employer].

## 2023-06-28 NOTE — Progress Notes (Signed)
[facility] OFFICE PROGRESS NOTE   Diagnosis: Colon cancer  INTERVAL HISTORY:   Aaron Roth returns as scheduled.  He denies bleeding.  No abdominal pain.  He continues to have frequent bowel movements, not watery.  Objective:  Vital signs in last 24 hours:  Blood pressure (!) 140/88, pulse 90, temperature 98.1 F (36.7 C), temperature source Tympanic, resp. rate 18, height 5\' 7"  (1.702 m), weight 152 lb (68.9 kg), SpO2 100%.     Resp: Lungs clear bilaterally. Cardio: Regular rate and rhythm. GI: Large firm central abdominal wall mass. Vascular: Edema throughout the right leg and left lower leg. Portacath without erythema.  Lab Results:  Lab Results  Component Value Date   WBC 13.7 (H) 06/23/2023   HGB 8.2 (L) 06/23/2023   HCT 27.4 (L) 06/23/2023   MCV 71.7 (L) 06/23/2023   PLT 282 06/23/2023   NEUTROABS 10.8 (H) 06/23/2023    Imaging:  No results found.  Medications: I have reviewed the patient's current medications.  Assessment/Plan: Colon cancer  CT abdomen/pelvis 08/02/2020-small amount of free fluid in the pelvis, postsurgical change involving the left colon, trace right pleural fluid.   Colonoscopy 08/04/2020-cecal mass as well as a 3 mm polyp in the ascending colon, 9 mm polyp in the ascending colon, 3 mm polyp in the transverse colon and 4 small polyps in the transverse colon.  Biopsy of the cecal mass showed adenocarcinoma.  Pathology on the polyps showed tubular adenomas with no high-grade dysplasia or malignancy identified. CT chest 08/04/2020-no evidence of metastatic disease CEA 08/04/2020-2.0 Laparoscopic right colectomy 10/17/2020 by Dr. Nigel Mormon showed adenocarcinoma with mucinous features, moderately differentiated, 6.7 cm; no carcinoma identified and 23 lymph nodes; margins uninvolved; no macroscopic tumor perforation identified; tumor invaded through muscularis propria into pericolorectal tissue; no lymphovascular invasion and no  perineural invasion, pT3, pN0, mismatch repair protein IHC normal, MSI stable.   06/18/2022-CT abdomen/pelvis-homogenous soft tissue mass in the anterior abdomen deep to the umbilicus, 5 mm left lower lobe nodule, new from October 2021 11/04/2022 biopsy of abdominal mass-metastatic mucinous adenocarcinoma, mismatch repair protein IHC normal, foundation 1 pending CTs 11/18/2022-large mass central lower abdomen is increased in size.  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  No evidence of obstruction.  Irregular lesions right lobe of the liver likely new, compatible with metastatic disease.  Increase size of a solid left lower lobe pulmonary nodule likely due to metastatic disease.  Solid pulmonary nodule left upper lobe increased in size likely due to indolent primary lung malignancy. Foundation 1 testing (date of collection 10/17/2020, specimen received 11/24/2022)-microsatellite status is equivocal; tumor mutation burden 6; K-ras wild-type; NRAS G12S; BRAF G466A Cycle 1 FOLFIRI 12/07/2022 Chemotherapy held 12/21/2022 due to neutropenia Chemotherapy held 12/28/2022 per patient due to upcoming dental extractions Cycle 2 FOLFIRI 01/11/2023, irinotecan dose reduced secondary to diarrhea neutropenia, Udenyca Cycle 3 FOLFIRI 01/25/2023 Cycle 4 FOLFIRI 02/08/2023 Cycle 5 FOLFIRI 02/22/2023 CTs 02/24/2023-progression of liver metastases; similar to minimal enlargement of dominant anterior abdominopelvic wall mass with 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, new trace perihepatic ascites Cycle 1 FOLFOX 03/15/2023 Cycle 2 FOLFOX 04/06/2023 Cycle 3 FOLFOX 04/19/2023 Cycle 4 FOLFOX 05/05/2023, oxaliplatin diluted in a larger volume and infusion time extended, premedications adjusted due to pruritus during cycle 3 Cycle 5 FOLFOX 06/02/2023 CTs 06/23/2023-pulmonary emboli, stable pulmonary nodules, new trace bilateral pleural  effusions, mild enlargement of hepatic metastases and the  anterior abdominal wall mass, fistulous communication of the mass and bowel Cycle 1 Lonsurf 07/05/2023 (anticipated start date) Iron deficiency anemia October 2021  08/02/2020 hemoglobin 3.8, MCV 56, ferritin 3, stool positive for occult blood 10/19/2020 hemoglobin 7.3, MCV 74 11/04/2022 hemoglobin 4.5 11/05/2022 ferritin 3 Ferrous sulfate 325 mg twice daily recommended 11/06/2022 2 units packed red blood cells 11/17/2022 stool cards positive for blood 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 He completed cycle 1 CAPOX beginning 11/13/2011; cycle 7 beginning 03/23/2012. Cycle 8 was completed beginning on 04/13/2012 without oxaliplatin. Restaging CT scans 06/06/2012 without evidence of recurrent disease.   Family history-brother with colon cancer Multiple polyps on colonoscopy 08/04/2020  Port-A-Cath placement 12/04/2022, Interventional Radiology 06/23/2023-right iliac/femoral DVT and pulmonary emboli Lovenox starting 06/23/2023  Disposition: Aaron Roth appears unchanged.  He is accompanied by his wife.  They understand the recent restaging CTs show evidence of disease progression.  They further understand he is not a candidate for Panitumumab/encorafenib due to the NRAS and BRAF mutations.  We reviewed supportive/comfort care versus a trial of systemic therapy.  We specifically discussed Lonsurf and fruquintinib.  He is not interested in hospice.  He would like to continue systemic therapy.  Dr. Kalman Drape recommendation is Lonsurf.  We reviewed potential side effects including bone marrow toxicity, nausea/vomiting, mouth sores, diarrhea.  He was provided with printed information.  He agrees to proceed.  Anticipate start date cycle 1 07/05/2023.  He continues Lovenox anticoagulation for the recent PE/DVT.  He will return for lab and follow-up  the week of 07/26/2023.  He will contact the office in the interim with any problems.  Patient seen with Dr. Truett Perna.   Lonna Cobb ANP/GNP-BC   06/28/2023  11:16 AM  This was a shared visit with Lonna Cobb.  We discussed treatment options with Aaron Roth.  He is not a candidate for EGFR or  BRAF inhibitor therapy .  He does not wish to enter hospice care.  We discussed Lonsurf and fruquintinib.  The plan is to begin a trial of Lonsurf.  We reviewed potential toxicities associated with Lonsurf.  He will continue Lovenox anticoagulation.  A Lonsurf prescription was entered today.  I was present for greater than 50% of today's visit.  I performed medical decision making.   Mancel Bale, MD

## 2023-06-29 ENCOUNTER — Telehealth: Payer: Self-pay

## 2023-06-29 ENCOUNTER — Other Ambulatory Visit: Payer: Self-pay

## 2023-06-29 ENCOUNTER — Other Ambulatory Visit (HOSPITAL_COMMUNITY): Payer: Self-pay

## 2023-06-29 ENCOUNTER — Telehealth: Payer: Self-pay | Admitting: Pharmacist

## 2023-06-29 ENCOUNTER — Ambulatory Visit: Payer: Self-pay | Admitting: Nurse Practitioner

## 2023-06-29 DIAGNOSIS — C182 Malignant neoplasm of ascending colon: Secondary | ICD-10-CM

## 2023-06-29 MED ORDER — LONSURF 15-6.14 MG PO TABS
45.0000 mg | ORAL_TABLET | Freq: Two times a day (BID) | ORAL | 0 refills | Status: DC
  Filled 2023-06-29: qty 60, 10d supply, fill #0

## 2023-06-29 NOTE — Telephone Encounter (Signed)
Oral Oncology Patient Advocate Encounter   Submitted application for assistance for Lonsurf to [facility] Oncology Patient Assistance Program.   Application submitted via e-fax to [phone]   Taiho's phone number [phone].   I will continue to check the status until final determination.    Ardeen Fillers, CPhT Oncology Pharmacy Patient Advocate  [facility]  [phone] (phone) [phone] (fax) 06/29/2023 3:38 PM

## 2023-06-29 NOTE — Telephone Encounter (Signed)
The patient has verbally confirmed their understanding and stated that they are taking potassium twice daily.

## 2023-06-29 NOTE — Addendum Note (Signed)
Addended by: Dimitri Ped on: 06/29/2023 02:28 PM   Modules accepted: Orders

## 2023-06-29 NOTE — Telephone Encounter (Signed)
Oral Oncology Patient Advocate Encounter  After completing a benefits investigation, prior authorization for Lonsurf is not required at this time.  Patient is uninsured.   Initiating PAP. See additional encounter.      Ardeen Fillers, CPhT Oncology Pharmacy Patient Advocate  [facility]  [phone] (phone) [phone] (fax) 06/29/2023 10:08 AM

## 2023-06-29 NOTE — Telephone Encounter (Signed)
Oral Oncology Patient Advocate Encounter  Reached out and spoke with patient regarding PAP paperwork, explained that I would send it to their preferred email via Docu[employer].   Confirmed email address: [email or web address].    Patient expressed understanding and consent.  Will follow up once paperwork has been signed and returned.   Ardeen Fillers, CPhT Oncology Pharmacy Patient Advocate  [facility]  [phone] (phone) [phone] (fax) 06/29/2023 11:32 AM

## 2023-06-29 NOTE — Telephone Encounter (Signed)
Oral Oncology Patient Advocate Encounter   Began application for assistance for Lonsurf through [facility] Patient Assistance Program.   Application will be submitted upon completion of necessary supporting documentation.   Taiho's phone number [phone].   I will continue to check the status until final determination.    Ardeen Fillers, CPhT Oncology Pharmacy Patient Advocate  [facility]  [phone] (phone) [phone] (fax) 06/29/2023 10:16 AM

## 2023-06-29 NOTE — Telephone Encounter (Signed)
Clinical Pharmacist Practitioner Encounter   Received new prescription for Lonsurf (trifluridine/tipiracil) for the treatment of metastatic colon cancer, planned duration until disease progression or unacceptable drug toxicity.  CMP from 06/28/23 assessed, no relevant lab abnormalities. Prescription dose and frequency assessed.   Current medication list in Epic reviewed, no DDIs with Lonsurf identified.  Evaluated chart and no patient barriers to medication adherence identified.   Prescription has been e-scribed to the [facility] for benefits analysis and approval.  Oral Oncology Clinic will continue to follow for insurance authorization, copayment issues, initial counseling and start date.  Patient agreed to treatment on 06/27/33 per NP documentation.  Remi Haggard, PharmD, BCPS, BCOP, CPP Hematology/Oncology Clinical Pharmacist Practitioner [facility] [phone]  06/29/2023 8:31 AM

## 2023-06-29 NOTE — Telephone Encounter (Signed)
-----   Message from Lonna Cobb sent at 06/29/2023  8:10 AM EDT ----- Please let him know potassium is better but remains low.  How is he currently taking potassium?

## 2023-06-30 ENCOUNTER — Inpatient Hospital Stay: Payer: Self-pay

## 2023-06-30 NOTE — Telephone Encounter (Signed)
Received fax notification from [facility] Patient Assistance Program that application was received, completed, and that they would begin processing application. I will continue to follow and update until final determination.    Ardeen Fillers, CPhT Oncology Pharmacy Patient Advocate  [facility]  [phone] (phone) [phone] (fax) 06/30/2023 8:08 AM

## 2023-07-01 ENCOUNTER — Other Ambulatory Visit: Payer: Self-pay

## 2023-07-01 DIAGNOSIS — I2699 Other pulmonary embolism without acute cor pulmonale: Secondary | ICD-10-CM

## 2023-07-01 MED ORDER — ENOXAPARIN SODIUM 100 MG/ML IJ SOSY
90.0000 mg | PREFILLED_SYRINGE | INTRAMUSCULAR | 0 refills | Status: AC
Start: 2023-07-01 — End: ?
  Filled 2023-07-01 – 2023-07-02 (×2): qty 7, 7d supply, fill #0

## 2023-07-02 ENCOUNTER — Other Ambulatory Visit: Payer: Self-pay

## 2023-07-02 ENCOUNTER — Inpatient Hospital Stay: Payer: Self-pay

## 2023-07-02 ENCOUNTER — Encounter: Payer: Self-pay | Admitting: Oncology

## 2023-07-06 NOTE — Telephone Encounter (Addendum)
Called to check status of application. Informed by representative that they attempted to contact patient to go over application before receiving an approval or denial for application. They attempted to contact patient on 07/02/23 and are scheduled to reach to out to patient again on Wednesday, 07/07/23. Representative indicated that patient could call them at [phone] and complete this step.   I called and spoke to patient's wife, Aaron Roth. I informed her of what the representative at [facility] Patient Support relayed to me and provided her with [employer]'s phone number to call and finalize application. Patient's wife expressed understanding and informed me she would call to take care of that. I will continue to follow and update until final determination.     Ardeen Fillers, CPhT Oncology Pharmacy Patient Advocate  [facility]  [phone] (phone) [phone] (fax) 07/06/2023 10:55 AM

## 2023-07-09 NOTE — Progress Notes (Signed)
This encounter was created in error - please disregard.

## 2023-07-09 NOTE — Telephone Encounter (Addendum)
Called to check status of application. Informed by representative that, per my last note, patient did call to go over financials and application with [facility]. Representative has requested income documents from patient to finalize application and has sent the patient a secure link with which to upload said documents. Representative has not received those yet. I will reach out to the patient and request those be sent as soon as possible. I will continue to follow and update until final determination.   Called and left VM for patient requesting a call back to let them know they could also drop off their Income Documents at the MD Office and I could submit to [facility] Patient Assistance Program for them as well.    Ardeen Fillers, CPhT Oncology Pharmacy Patient Advocate  [facility]  [phone] (phone) [phone] (fax) 07/09/2023 8:29 AM

## 2023-07-11 ENCOUNTER — Other Ambulatory Visit: Payer: Self-pay

## 2023-07-12 ENCOUNTER — Telehealth: Payer: Self-pay

## 2023-07-12 ENCOUNTER — Encounter (HOSPITAL_COMMUNITY): Payer: Self-pay

## 2023-07-12 ENCOUNTER — Emergency Department (HOSPITAL_COMMUNITY): Payer: Self-pay

## 2023-07-12 ENCOUNTER — Other Ambulatory Visit: Payer: Self-pay

## 2023-07-12 ENCOUNTER — Encounter: Payer: Self-pay | Admitting: *Deleted

## 2023-07-12 ENCOUNTER — Inpatient Hospital Stay (HOSPITAL_COMMUNITY)
Admission: EM | Admit: 2023-07-12 | Discharge: 2023-07-20 | DRG: 872 | Disposition: A | Discharge status: Skilled Nursing Facility | Payer: Medicare Other | Attending: Internal Medicine | Admitting: Internal Medicine

## 2023-07-12 ENCOUNTER — Emergency Department (HOSPITAL_COMMUNITY): Payer: Medicare Other

## 2023-07-12 DIAGNOSIS — I10 Essential (primary) hypertension: Secondary | ICD-10-CM

## 2023-07-12 DIAGNOSIS — Z9049 Acquired absence of other specified parts of digestive tract: Secondary | ICD-10-CM | POA: Diagnosis not present

## 2023-07-12 DIAGNOSIS — Z66 Do not resuscitate: Secondary | ICD-10-CM | POA: Diagnosis present

## 2023-07-12 DIAGNOSIS — Z888 Allergy status to other drugs, medicaments and biological substances status: Secondary | ICD-10-CM

## 2023-07-12 DIAGNOSIS — B9689 Other specified bacterial agents as the cause of diseases classified elsewhere: Secondary | ICD-10-CM

## 2023-07-12 DIAGNOSIS — S0003XA Contusion of scalp, initial encounter: Secondary | ICD-10-CM | POA: Diagnosis present

## 2023-07-12 DIAGNOSIS — Z7952 Long term (current) use of systemic steroids: Secondary | ICD-10-CM

## 2023-07-12 DIAGNOSIS — Z7901 Long term (current) use of anticoagulants: Secondary | ICD-10-CM

## 2023-07-12 DIAGNOSIS — Z91148 Patient's other noncompliance with medication regimen for other reason: Secondary | ICD-10-CM

## 2023-07-12 DIAGNOSIS — Z800 Family history of malignant neoplasm of digestive organs: Secondary | ICD-10-CM | POA: Diagnosis not present

## 2023-07-12 DIAGNOSIS — Z9221 Personal history of antineoplastic chemotherapy: Secondary | ICD-10-CM

## 2023-07-12 DIAGNOSIS — E876 Hypokalemia: Secondary | ICD-10-CM | POA: Diagnosis present

## 2023-07-12 DIAGNOSIS — Z1611 Resistance to penicillins: Secondary | ICD-10-CM | POA: Diagnosis present

## 2023-07-12 DIAGNOSIS — Z91013 Allergy to seafood: Secondary | ICD-10-CM

## 2023-07-12 DIAGNOSIS — F17210 Nicotine dependence, cigarettes, uncomplicated: Secondary | ICD-10-CM | POA: Diagnosis present

## 2023-07-12 DIAGNOSIS — F05 Delirium due to known physiological condition: Secondary | ICD-10-CM | POA: Diagnosis present

## 2023-07-12 DIAGNOSIS — N3000 Acute cystitis without hematuria: Secondary | ICD-10-CM

## 2023-07-12 DIAGNOSIS — R531 Weakness: Secondary | ICD-10-CM

## 2023-07-12 DIAGNOSIS — C787 Secondary malignant neoplasm of liver and intrahepatic bile duct: Secondary | ICD-10-CM | POA: Diagnosis present

## 2023-07-12 DIAGNOSIS — E860 Dehydration: Secondary | ICD-10-CM | POA: Diagnosis present

## 2023-07-12 DIAGNOSIS — E162 Hypoglycemia, unspecified: Secondary | ICD-10-CM

## 2023-07-12 DIAGNOSIS — C182 Malignant neoplasm of ascending colon: Secondary | ICD-10-CM | POA: Diagnosis present

## 2023-07-12 DIAGNOSIS — R54 Age-related physical debility: Secondary | ICD-10-CM | POA: Diagnosis present

## 2023-07-12 DIAGNOSIS — Z86718 Personal history of other venous thrombosis and embolism: Secondary | ICD-10-CM | POA: Diagnosis not present

## 2023-07-12 DIAGNOSIS — A414 Sepsis due to anaerobes: Secondary | ICD-10-CM

## 2023-07-12 DIAGNOSIS — Z86711 Personal history of pulmonary embolism: Secondary | ICD-10-CM

## 2023-07-12 DIAGNOSIS — G9341 Metabolic encephalopathy: Secondary | ICD-10-CM | POA: Insufficient documentation

## 2023-07-12 DIAGNOSIS — Z79899 Other long term (current) drug therapy: Secondary | ICD-10-CM | POA: Diagnosis not present

## 2023-07-12 DIAGNOSIS — Z556 Problems related to health literacy: Secondary | ICD-10-CM

## 2023-07-12 DIAGNOSIS — K219 Gastro-esophageal reflux disease without esophagitis: Secondary | ICD-10-CM | POA: Diagnosis present

## 2023-07-12 DIAGNOSIS — D500 Iron deficiency anemia secondary to blood loss (chronic): Secondary | ICD-10-CM

## 2023-07-12 DIAGNOSIS — N390 Urinary tract infection, site not specified: Secondary | ICD-10-CM | POA: Diagnosis present

## 2023-07-12 DIAGNOSIS — A4159 Other Gram-negative sepsis: Principal | ICD-10-CM | POA: Diagnosis present

## 2023-07-12 DIAGNOSIS — W1830XA Fall on same level, unspecified, initial encounter: Secondary | ICD-10-CM | POA: Diagnosis present

## 2023-07-12 DIAGNOSIS — Z808 Family history of malignant neoplasm of other organs or systems: Secondary | ICD-10-CM

## 2023-07-12 DIAGNOSIS — Z1152 Encounter for screening for COVID-19: Secondary | ICD-10-CM | POA: Diagnosis not present

## 2023-07-12 DIAGNOSIS — Z751 Person awaiting admission to adequate facility elsewhere: Secondary | ICD-10-CM

## 2023-07-12 DIAGNOSIS — C792 Secondary malignant neoplasm of skin: Secondary | ICD-10-CM | POA: Diagnosis present

## 2023-07-12 DIAGNOSIS — Z8042 Family history of malignant neoplasm of prostate: Secondary | ICD-10-CM

## 2023-07-12 LAB — CBG MONITORING, ED
Glucose-Capillary: 13 mg/dL — CL (ref 70–99)
Glucose-Capillary: 179 mg/dL — ABNORMAL HIGH (ref 70–99)
Glucose-Capillary: 19 mg/dL — CL (ref 70–99)
Glucose-Capillary: 31 mg/dL — CL (ref 70–99)
Glucose-Capillary: 126 mg/dL — ABNORMAL HIGH (ref 70–99)

## 2023-07-12 LAB — URINALYSIS, W/ REFLEX TO CULTURE (INFECTION SUSPECTED)
Bilirubin Urine: NEGATIVE
Glucose, UA: NEGATIVE mg/dL
Ketones, ur: NEGATIVE mg/dL
Nitrite: POSITIVE — AB
Protein, ur: 30 mg/dL — AB
Specific Gravity, Urine: 1.021 (ref 1.005–1.030)
WBC, UA: >50 WBC/hpf (ref 0–5)
pH: 5 (ref 5.0–8.0)

## 2023-07-12 LAB — CBC WITH DIFFERENTIAL/PLATELET
Abs Immature Granulocytes: 0.02 10*3/uL (ref 0.00–0.07)
Basophils Absolute: 0 10*3/uL (ref 0.0–0.1)
Basophils Relative: 0 %
Eosinophils Absolute: 0 10*3/uL (ref 0.0–0.5)
Eosinophils Relative: 0 %
HCT: 31.3 % — ABNORMAL LOW (ref 39.0–52.0)
Hemoglobin: 9.3 g/dL — ABNORMAL LOW (ref 13.0–17.0)
Immature Granulocytes: 0 %
Lymphocytes Relative: 20 %
Lymphs Abs: 1.2 10*3/uL (ref 0.7–4.0)
MCH: 23.7 pg — ABNORMAL LOW (ref 26.0–34.0)
MCHC: 29.7 g/dL — ABNORMAL LOW (ref 30.0–36.0)
MCV: 79.6 fL — ABNORMAL LOW (ref 80.0–100.0)
Monocytes Absolute: 0.4 10*3/uL (ref 0.1–1.0)
Monocytes Relative: 7 %
Neutro Abs: 4.3 10*3/uL (ref 1.7–7.7)
Neutrophils Relative %: 73 %
Platelets: 212 10*3/uL (ref 150–400)
RBC: 3.93 MIL/uL — ABNORMAL LOW (ref 4.22–5.81)
WBC: 6 10*3/uL (ref 4.0–10.5)
nRBC: 0 % (ref 0.0–0.2)

## 2023-07-12 LAB — COMPREHENSIVE METABOLIC PANEL
ALT: 13 U/L (ref 0–44)
AST: 17 U/L (ref 15–41)
Albumin: <1.5 g/dL — ABNORMAL LOW (ref 3.5–5.0)
Alkaline Phosphatase: 97 U/L (ref 38–126)
Anion gap: 8 (ref 5–15)
BUN: 21 mg/dL (ref 8–23)
CO2: 27 mmol/L (ref 22–32)
Calcium: 7.1 mg/dL — ABNORMAL LOW (ref 8.9–10.3)
Chloride: 102 mmol/L (ref 98–111)
Creatinine, Ser: 0.9 mg/dL (ref 0.61–1.24)
GFR, Estimated: >60 mL/min (ref >60)
Glucose, Bld: 72 mg/dL (ref 70–99)
Potassium: 2.9 mmol/L — ABNORMAL LOW (ref 3.5–5.1)
Sodium: 137 mmol/L (ref 135–145)
Total Bilirubin: 0.7 mg/dL (ref 0.3–1.2)
Total Protein: 4.7 g/dL — ABNORMAL LOW (ref 6.5–8.1)

## 2023-07-12 LAB — APTT: aPTT: 36 s (ref 24–36)

## 2023-07-12 LAB — POC OCCULT BLOOD, ED: Fecal Occult Bld: POSITIVE — AB

## 2023-07-12 LAB — BRAIN NATRIURETIC PEPTIDE: B Natriuretic Peptide: 114 pg/mL — ABNORMAL HIGH (ref 0.0–100.0)

## 2023-07-12 LAB — TROPONIN I (HIGH SENSITIVITY): Troponin I (High Sensitivity): 34 ng/L — ABNORMAL HIGH (ref <18)

## 2023-07-12 LAB — PROTIME-INR
INR: 1.2 (ref 0.8–1.2)
Prothrombin Time: 15.7 s — ABNORMAL HIGH (ref 11.4–15.2)

## 2023-07-12 LAB — I-STAT CG4 LACTIC ACID, ED
Lactic Acid, Venous: 1 mmol/L (ref 0.5–1.9)
Lactic Acid, Venous: 1.2 mmol/L (ref 0.5–1.9)

## 2023-07-12 LAB — MAGNESIUM: Magnesium: 2.1 mg/dL (ref 1.7–2.4)

## 2023-07-12 MED ORDER — FERROUS SULFATE 325 (65 FE) MG PO TABS
325.0000 mg | ORAL_TABLET | Freq: Two times a day (BID) | ORAL | Status: DC
  Administered 2023-07-13 – 2023-07-15 (×6): 325 mg via ORAL
  Filled 2023-07-12 (×10): qty 1

## 2023-07-12 MED ORDER — SODIUM CHLORIDE 0.9 % IV SOLN: INTRAVENOUS | Status: AC

## 2023-07-12 MED ORDER — DEXTROSE 50 % IV SOLN
INTRAVENOUS | Status: AC
  Filled 2023-07-12: qty 50

## 2023-07-12 MED ORDER — CALCIUM GLUCONATE-NACL 1-0.675 GM/50ML-% IV SOLN
1.0000 g | Freq: Once | INTRAVENOUS | Status: AC
  Administered 2023-07-13: 1000 mg via INTRAVENOUS
  Filled 2023-07-12: qty 50

## 2023-07-12 MED ORDER — ENOXAPARIN SODIUM 100 MG/ML IJ SOSY
100.0000 mg | PREFILLED_SYRINGE | INTRAMUSCULAR | Status: DC
  Administered 2023-07-13 – 2023-07-14 (×2): 100 mg via SUBCUTANEOUS
  Filled 2023-07-12 (×6): qty 1

## 2023-07-12 MED ORDER — SODIUM CHLORIDE 0.9 % IV SOLN: 250.0000 mL | INTRAVENOUS | Status: DC | PRN

## 2023-07-12 MED ORDER — DEXTROSE 50 % IV SOLN
1.0000 | Freq: Once | INTRAVENOUS | Status: AC
  Administered 2023-07-12: 50 mL via INTRAVENOUS
  Filled 2023-07-12: qty 50

## 2023-07-12 MED ORDER — SODIUM CHLORIDE 0.9% FLUSH: 3.0000 mL | INTRAVENOUS | Status: DC | PRN

## 2023-07-12 MED ORDER — POTASSIUM CHLORIDE CRYS ER 20 MEQ PO TBCR
40.0000 meq | EXTENDED_RELEASE_TABLET | Freq: Once | ORAL | Status: AC
  Administered 2023-07-12: 40 meq via ORAL
  Filled 2023-07-12: qty 2

## 2023-07-12 MED ORDER — AMLODIPINE BESYLATE 10 MG PO TABS
10.0000 mg | ORAL_TABLET | Freq: Every day | ORAL | Status: DC
  Administered 2023-07-13 – 2023-07-15 (×3): 10 mg via ORAL
  Filled 2023-07-12 (×2): qty 1
  Filled 2023-07-12: qty 2
  Filled 2023-07-12 (×3): qty 1

## 2023-07-12 MED ORDER — SODIUM CHLORIDE 0.9 % IV SOLN
1.0000 g | INTRAVENOUS | Status: DC
  Administered 2023-07-12: 1 g via INTRAVENOUS
  Filled 2023-07-12: qty 10

## 2023-07-12 MED ORDER — SODIUM CHLORIDE 0.9% FLUSH
3.0000 mL | Freq: Two times a day (BID) | INTRAVENOUS | Status: DC
  Administered 2023-07-12 – 2023-07-14 (×3): 3 mL via INTRAVENOUS

## 2023-07-12 MED ORDER — CARVEDILOL 25 MG PO TABS
25.0000 mg | ORAL_TABLET | Freq: Two times a day (BID) | ORAL | Status: DC
  Administered 2023-07-13 – 2023-07-15 (×6): 25 mg via ORAL
  Filled 2023-07-12: qty 2
  Filled 2023-07-12 (×8): qty 1

## 2023-07-12 MED ORDER — ENOXAPARIN SODIUM 100 MG/ML IJ SOSY
100.0000 mg | PREFILLED_SYRINGE | INTRAMUSCULAR | Status: AC
  Filled 2023-07-12: qty 1

## 2023-07-12 MED ORDER — POTASSIUM CHLORIDE 10 MEQ/100ML IV SOLN
10.0000 meq | Freq: Once | INTRAVENOUS | Status: AC
  Administered 2023-07-12: 10 meq via INTRAVENOUS
  Filled 2023-07-12: qty 100

## 2023-07-12 MED ORDER — POTASSIUM CHLORIDE CRYS ER 20 MEQ PO TBCR
20.0000 meq | EXTENDED_RELEASE_TABLET | Freq: Two times a day (BID) | ORAL | Status: DC
  Administered 2023-07-13 – 2023-07-16 (×7): 20 meq via ORAL
  Filled 2023-07-12 (×10): qty 1

## 2023-07-12 MED ORDER — LACTATED RINGERS IV BOLUS
500.0000 mL | Freq: Once | INTRAVENOUS | Status: AC
  Administered 2023-07-12: 500 mL via INTRAVENOUS

## 2023-07-12 NOTE — Assessment & Plan Note (Addendum)
Resolved. BG from port-a-cath was normal despite CBG from finger saying BG was low. Stopped accuchecks.

## 2023-07-12 NOTE — Assessment & Plan Note (Signed)
Secondary to UTI and dehydration Continue with PT

## 2023-07-12 NOTE — Assessment & Plan Note (Signed)
-   Stable.  Hemoglobin of 9.3 with baseline around 7-8

## 2023-07-12 NOTE — Telephone Encounter (Signed)
Mrs. Cavazos called to report that her spouse is very confused and experiencing falls. He is not behaving as he normally does.  I advised Mrs. Geremia to take him to [facility] for evaluation.

## 2023-07-12 NOTE — Progress Notes (Signed)
ANTICOAGULATION CONSULT NOTE - Initial Consult  Pharmacy Consult for Lovenox Indication: DVT/PE  Allergies  Allergen Reactions   Hydralazine Itching   Other     Pre-breaded shrimp   Oxaliplatin Itching    Patient had hypersensitivity reaction to Oxaliplatin. See progress note from 04/19/2023 at 1830. Patient did not complete infusion.    Patient Measurements: Height: 5\' 7"  (170.2 cm) Weight: 68.9 kg (152 lb) IBW/kg (Calculated) : 66.1    Vital Signs: Temp: 98.9 F (37.2 C) (09/09 2037) Temp Source: Rectal (09/09 2037) BP: 162/95 (09/09 2030) Pulse Rate: 78 (09/09 1716)  Labs: Recent Labs    07/12/23 1815  HGB 9.3*  HCT 31.3*  PLT 212  APTT 36  LABPROT 15.7*  INR 1.2  CREATININE 0.90  TROPONINIHS 34*    Estimated Creatinine Clearance: 62.2 mL/min (by C-G formula based on SCr of 0.9 mg/dL).   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   Essential hypertension 08/10/2020   GERD (gastroesophageal reflux disease)    pt. denies    Medications:  PTA Lovenox 90mg  sq q24h - LD 9/8 @ 1830  Assessment: 77 yr male admitted with weakness due to UTI and dehydration.  PMH significant for DVT and PE and on Lovenox PTA  Goal of Therapy:  Full anticoagulation Monitor platelets by anticoagulation protocol: Yes   Plan:  Lovenox 100mg  sq q24h (1.5 mg/kg q24h) Follow CBC  Tanay Misuraca, Joselyn Glassman, PharmD 07/12/2023,11:47 PM

## 2023-07-12 NOTE — ED Provider Notes (Signed)
[place] EMERGENCY DEPARTMENT AT [facility] Provider Note   CSN: [id number] Arrival date & time: 07/12/23  1700     History  Chief Complaint  Patient presents with   Weakness    Aaron Roth is a 77 y.o. male.  HPI 77 year old male with a history of colon cancer who is on chemotherapy presents with weakness.  History is from patient and wife.  The patient has not had much improvement in his chemotherapy and they are due to switch to an oral agent soon.  However over the last 3-4 days he has become weak.  He is unable to walk besides a few steps and if he stands for about a minute his legs will give out any will fall.  He fell yesterday though does not seem to have any significant injury.  Does not think he hit his head.  He is on Lovenox for a DVT/PE.  He has not had any fever, cough, sore throat, headache or focal weakness but feels weak all over.  No urinary symptoms.  He has chronic diarrhea but this is unchanged and he does not think he has had any rectal bleeding.  Wife feels like over the last couple days he has been on and off confused. Care transferred to me.  No tenderness or swelling or redness over his port.  He has bilateral lower extremity swelling that seems a little improved.  Home Medications Prior to Admission medications   Medication Sig Start Date End Date Taking? Authorizing Provider  amLODipine (NORVASC) 10 MG tablet Take 1 tablet (10 mg total) by mouth daily. 01/07/23   Storm Frisk, MD  carvedilol (COREG) 25 MG tablet Take 1 tablet (25 mg total) by mouth 2 (two) times daily with a meal. 01/07/23   Storm Frisk, MD  enoxaparin (LOVENOX) 100 MG/ML injection Inject 0.9 mLs (90 mg total) into the skin daily. 07/01/23   Ladene Artist, MD  ferrous sulfate 325 (65 FE) MG tablet TAKE 1 TABLET (325 MG TOTAL) BY MOUTH 2 (TWO) TIMES DAILY WITH A MEAL. Patient taking differently: Take 325 mg by mouth daily. 10/16/21 06/24/23  Storm Frisk, MD   lidocaine-prilocaine (EMLA) cream Apply to port site 1-2 hours prior to use 12/07/22   Rana Snare, NP  Nutritional Supplements (IMMUNE ENHANCE) TABS Take 1 tablet by mouth daily.    [provider]  potassium chloride (KLOR-CON M) 10 MEQ tablet Take 2 tablets (20 mEq total) by mouth 2 (two) times daily. 06/24/23   Ladene Artist, MD  predniSONE (DELTASONE) 10 MG tablet Take 1 tablet (10 mg total) by mouth daily with breakfast. 06/24/23   Ladene Artist, MD  prochlorperazine (COMPAZINE) 10 MG tablet Take 1 tablet (10 mg total) by mouth every 6 (six) hours as needed for nausea or vomiting. 12/07/22   Rana Snare, NP  trifluridine-tipiracil (LONSURF) 15-6.14 MG tablet Take 3 tablets (45 mg of trifluridine total) by mouth 2 (two) times daily after a meal. Take within 1 hr after AM & PM meals on days 1-5, 8-12. Repeat every 28 days. 06/29/23   Ladene Artist, MD      Allergies    Hydralazine, Other, and Oxaliplatin    Review of Systems   Review of Systems  Constitutional:  Positive for fatigue. Negative for fever.  HENT:  Negative for sore throat.   Respiratory:  Negative for cough and shortness of breath.   Cardiovascular:  Negative for chest  pain.  Gastrointestinal:  Positive for diarrhea. Negative for abdominal pain, blood in stool and vomiting.  Neurological:  Positive for weakness. Negative for headaches.    Physical Exam Updated Vital Signs BP (!) 162/95   Pulse 78   Temp 98.9 F (37.2 C) (Rectal)   Resp 18   Ht 5\' 7"  (1.702 m)   Wt 68.9 kg   SpO2 95%   BMI 23.81 kg/m  Physical Exam Vitals and nursing note reviewed.  Constitutional:      General: He is not in acute distress.    Appearance: He is well-developed. He is not ill-appearing or diaphoretic.  HENT:     Head: Normocephalic and atraumatic.  Cardiovascular:     Rate and Rhythm: Normal rate and regular rhythm.     Heart sounds: Normal heart sounds.  Pulmonary:     Effort: Pulmonary effort is normal.      Breath sounds: Normal breath sounds. No wheezing.  Abdominal:     Palpations: Abdomen is soft. There is mass (from known colon cancer).     Tenderness: There is no abdominal tenderness.  Musculoskeletal:     Cervical back: No rigidity.     Right lower leg: Edema present.     Left lower leg: Edema present.  Skin:    General: Skin is warm and dry.  Neurological:     Mental Status: He is alert.     Comments: 5/5 strength in BUE. 4/5 strength in BLE.      ED Results / Procedures / Treatments   Labs (all labs ordered are listed, but only abnormal results are displayed) Labs Reviewed  COMPREHENSIVE METABOLIC PANEL - Abnormal; Notable for the following components:      Result Value   Potassium 2.9 (*)    Calcium 7.1 (*)    Total Protein 4.7 (*)    Albumin <1.5 (*)    All other components within normal limits  CBC WITH DIFFERENTIAL/PLATELET - Abnormal; Notable for the following components:   RBC 3.93 (*)    Hemoglobin 9.3 (*)    HCT 31.3 (*)    MCV 79.6 (*)    MCH 23.7 (*)    MCHC 29.7 (*)    All other components within normal limits  PROTIME-INR - Abnormal; Notable for the following components:   Prothrombin Time 15.7 (*)    All other components within normal limits  URINALYSIS, W/ REFLEX TO CULTURE (INFECTION SUSPECTED) - Abnormal; Notable for the following components:   APPearance HAZY (*)    Hgb urine dipstick SMALL (*)    Protein, ur 30 (*)    Nitrite POSITIVE (*)    Leukocytes,Ua LARGE (*)    Bacteria, UA MANY (*)    Non Squamous Epithelial 0-5 (*)    All other components within normal limits  BRAIN NATRIURETIC PEPTIDE - Abnormal; Notable for the following components:   B Natriuretic Peptide 114.0 (*)    All other components within normal limits  CBG MONITORING, ED - Abnormal; Notable for the following components:   Glucose-Capillary 19 (*)    All other components within normal limits  CBG MONITORING, ED - Abnormal; Notable for the following components:    Glucose-Capillary 31 (*)    All other components within normal limits  CBG MONITORING, ED - Abnormal; Notable for the following components:   Glucose-Capillary 13 (*)    All other components within normal limits  CBG MONITORING, ED - Abnormal; Notable for the following components:   Glucose-Capillary 179 (*)  All other components within normal limits  POC OCCULT BLOOD, ED - Abnormal; Notable for the following components:   Fecal Occult Bld POSITIVE (*)    All other components within normal limits  TROPONIN I (HIGH SENSITIVITY) - Abnormal; Notable for the following components:   Troponin I (High Sensitivity) 34 (*)    All other components within normal limits  CULTURE, BLOOD (ROUTINE X 2)  CULTURE, BLOOD (ROUTINE X 2)  URINE CULTURE  MAGNESIUM  APTT  I-STAT CG4 LACTIC ACID, ED  I-STAT CG4 LACTIC ACID, ED  CBG MONITORING, ED    EKG None  Radiology DG Chest Port 1 View  Result Date: 07/12/2023 CLINICAL DATA:  Questionable sepsis EXAM: PORTABLE CHEST 1 VIEW COMPARISON:  Chest x-ray 08/02/2020 FINDINGS: Right chest port catheter tip ends in the SVC. Cardiomediastinal silhouette is stable. The lungs are clear. There is no pleural effusion or pneumothorax. No acute fractures are seen. IMPRESSION: No active disease. Electronically Signed   By: Darliss Cheney M.D.   On: 07/12/2023 20:26   CT Head Wo Contrast  Result Date: 07/12/2023 CLINICAL DATA:  Altered mental status and fall EXAM: CT HEAD WITHOUT CONTRAST TECHNIQUE: Contiguous axial images were obtained from the base of the skull through the vertex without intravenous contrast. RADIATION DOSE REDUCTION: This exam was performed according to the departmental dose-optimization program which includes automated exposure control, adjustment of the mA and/or kV according to patient size and/or use of iterative reconstruction technique. COMPARISON:  None Available. FINDINGS: Brain: There is no mass, hemorrhage or extra-axial collection. The  appearance of the white matter is normal for the patient's age. There is moderate generalized atrophy. Vascular: No abnormal hyperdensity of the major intracranial arteries or dural venous sinuses. No intracranial atherosclerosis. Skull: Small right parietal scalp hematoma.  No skull fracture. Sinuses/Orbits: No fluid levels or advanced mucosal thickening of the visualized paranasal sinuses. No mastoid or middle ear effusion. The orbits are normal. IMPRESSION: 1. No acute intracranial abnormality. 2. Small right parietal scalp hematoma without skull fracture. Electronically Signed   By: Deatra Robinson M.D.   On: 07/12/2023 20:08    Procedures Procedures    Medications Ordered in ED Medications  sodium chloride flush (NS) 0.9 % injection 3 mL (3 mLs Intravenous Given 07/12/23 2204)  sodium chloride flush (NS) 0.9 % injection 3 mL (has no administration in time range)  0.9 %  sodium chloride infusion (has no administration in time range)  dextrose 50 % solution (0 g  Hold 07/12/23 1925)  cefTRIAXone (ROCEPHIN) 1 g in sodium chloride 0.9 % 100 mL IVPB (has no administration in time range)  calcium gluconate 1 g/ 50 mL sodium chloride IVPB (has no administration in time range)  lactated ringers bolus 500 mL (0 mLs Intravenous Stopped 07/12/23 2151)  dextrose 50 % solution 50 mL (50 mLs Intravenous Given 07/12/23 1859)  potassium chloride SA (KLOR-CON M) CR tablet 40 mEq (40 mEq Oral Given 07/12/23 2151)  potassium chloride 10 mEq in 100 mL IVPB (10 mEq Intravenous New Bag/Given 07/12/23 2150)    ED Course/ Medical Decision Making/ A&P                                 Medical Decision Making Amount and/or Complexity of Data Reviewed Labs: ordered.    Details: Patient has chronic anemia.  His glucoses were quite low but all these were done by his finger.  Low potassium.  Urine consistent with UTI. Radiology: ordered and independent interpretation performed.    Details: No head bleed. ECG/medicine tests:  ordered and independent interpretation performed.    Details: No ischemia  Risk Prescription drug management. Decision regarding hospitalization.   Patient has no focal weakness on exam.  His glucoses were quite low but all these were done by his finger.  He did not appear to have a severe hypoglycemia such as 13 clinically but was given D50 and oral carbohydrates.  After this on recheck it was still quite low so it was checked via the blood from his port and his glucose had actually been 179.  Unclear if it was truly low or not.  His CMP glucose was normal at 72.  Think it still warrants some monitoring and he is also found to have a UTI which could certainly contribute to acute weakness over the past few days.  Rocephin started.  He otherwise has stable vitals and will need admission and I discussed with Dr. Cyndia Bent.        Final Clinical Impression(s) / ED Diagnoses Final diagnoses:  Hypokalemia  Acute urinary tract infection    Rx / DC Orders ED Discharge Orders     None         Pricilla Loveless, MD 07/12/23 2255

## 2023-07-12 NOTE — H&P (Signed)
History and Physical    Patient: Aaron Roth URK:[id number] DOB: 1946/07/30 DOA: 07/12/2023 DOS: the patient was seen and examined on 07/12/2023 PCP: Storm Frisk, MD  Patient coming from: Home  Chief Complaint:  Chief Complaint  Patient presents with   Weakness   HPI: PEREZ MICKELS is a 77 y.o. 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  Wife and son provides hx. Wife was asleep and heard pt call for help.Found to all the floor in the bathroom laying on his back and trying to get up. Pt does not really recall the fall but denies loss of consciousness. Son had trouble getting him out of bed so decided to come to ED. Denies any fever or chills.  No nausea, vomiting but has intermittent diarrhea since being on chemotherapy.  Has decreased appetite and has been losing weight.   In the ED, he had temperature of 100 F, blood pressure 130/95 on room air.  CBC without leukocytosis, hemoglobin 9.3 with baseline of 7-8.  Lactate within normal limits.  CMP notable for hypokalemia of 2.9, calcium 7.1 with albumin less than 1.5, creatinine stable at 0.90.  Magnesium within normal limits.  Troponin mildly elevated 34, BNP of 114.  UA was positive with large leukocyte, positive nitrite many bacteria.  Chest x-ray negative.  Positive FOBT likely secondary to his colon cancer as his hemoglobin is stable.  He also had hypoglycemia down to 13 although ED physician questions the validity of the test as patient was mentating well.  He was given D10 and had a repeat glucose that came up to 170s.   Review of Systems: As mentioned in the history of present illness. All other systems reviewed and are negative. 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   Essential hypertension 08/10/2020   GERD (gastroesophageal reflux disease)    pt. denies   Past Surgical History:  Procedure Laterality Date   BIOPSY  08/04/2020   Procedure: BIOPSY;  Surgeon: Bernette Redbird, MD;  Location: [facility];  Service: Endoscopy;;  EGD and COLON   COLONOSCOPY  06/03/2012   Procedure: COLONOSCOPY;  Surgeon: Kandis Cocking, MD;  Location: [facility];  Service: General;  Laterality: N/A;   COLONOSCOPY WITH PROPOFOL N/A 08/04/2020   Procedure: COLONOSCOPY WITH PROPOFOL;  Surgeon: Bernette Redbird, MD;  Location: [facility];  Service: Endoscopy;  Laterality: N/A;   COLOSTOMY  09/25/2011   Procedure: COLOSTOMY;  Surgeon: Kandis Cocking, MD;  Location: [facility];  Service: General;  Laterality: N/A;   COLOSTOMY TAKEDOWN  07/14/2012   Procedure: LAPAROSCOPIC COLOSTOMY TAKEDOWN;  Surgeon: Kandis Cocking, MD;  Location: [facility];  Service: General;  Laterality: N/A;  laparoscopic coverted to open takedown of colostomy   ESOPHAGOGASTRODUODENOSCOPY (EGD) WITH PROPOFOL N/A 08/04/2020   Procedure: ESOPHAGOGASTRODUODENOSCOPY (EGD) WITH PROPOFOL;  Surgeon: Bernette Redbird, MD;  Location: [facility];  Service: Endoscopy;  Laterality: N/A;   GANGLION CYST EXCISION     wrist and foot   IR IMAGING GUIDED PORT INSERTION  12/04/2022   LAPAROSCOPIC PARTIAL COLECTOMY N/A 10/17/2020   Procedure: LAPAROSCOPIC RIGHT COLECTOMY;  Surgeon: Romie Levee, MD;  Location: [facility];  Service: General;  Laterality: N/A;   PARTIAL COLECTOMY  09/25/2011   Procedure: PARTIAL COLECTOMY;  Surgeon: Kandis Cocking, MD;  Location: [facility];  Service: General;;   POLYPECTOMY  08/04/2020   Procedure: POLYPECTOMY;  Surgeon: Bernette Redbird, MD;  Location: [facility];   Service: Endoscopy;;   PORT-A-CATH REMOVAL  07/14/2012   Procedure: REMOVAL PORT-A-CATH;  Surgeon: Kandis Cocking, MD;  Location: [facility];  Service: General;  Laterality: N/A;   PORTACATH PLACEMENT  11/10/2011   Procedure: INSERTION PORT-A-CATH;  Surgeon: Kandis Cocking, MD;  Location: [facility];  Service: General;  Laterality: Left;  left subclavian   PORTACATH PLACEMENT  11/10/2011   Social History:  reports that he has been smoking cigarettes. He has a 10 pack-year smoking history. He has never used smokeless tobacco. He reports that he does not drink alcohol and does not use drugs.  Allergies  Allergen Reactions   Hydralazine Itching   Other     Pre-breaded shrimp   Oxaliplatin Itching    Patient had hypersensitivity reaction to Oxaliplatin. See progress note from 04/19/2023 at 1830. Patient did not complete infusion.    Family History  Problem Relation Age of Onset   Bone cancer Brother    ALS Sister    Cancer Brother        bone, colon, testicular   Cancer Father        prostate    Prior to Admission medications   Medication Sig Start Date End Date Taking? Authorizing Provider  amLODipine (NORVASC) 10 MG tablet Take 1 tablet (10 mg total) by mouth daily. 01/07/23   Storm Frisk, MD  carvedilol (COREG) 25 MG tablet Take 1 tablet (25 mg total) by mouth 2 (two) times daily with a meal. 01/07/23   Storm Frisk, MD  enoxaparin (LOVENOX) 100 MG/ML injection Inject 0.9 mLs (90 mg total) into the skin daily. 07/01/23   Ladene Artist, MD  ferrous sulfate 325 (65 FE) MG tablet TAKE 1 TABLET (325 MG TOTAL) BY MOUTH 2 (TWO) TIMES DAILY WITH A MEAL. Patient taking differently: Take 325 mg by mouth daily. 10/16/21 06/24/23  Storm Frisk, MD  lidocaine-prilocaine (EMLA) cream Apply to port site 1-2 hours prior to use 12/07/22   Rana Snare, NP  Nutritional Supplements (IMMUNE ENHANCE) TABS Take 1 tablet by mouth daily.    [provider]  potassium  chloride (KLOR-CON M) 10 MEQ tablet Take 2 tablets (20 mEq total) by mouth 2 (two) times daily. 06/24/23   Ladene Artist, MD  predniSONE (DELTASONE) 10 MG tablet Take 1 tablet (10 mg total) by mouth daily with breakfast. 06/24/23   Ladene Artist, MD  prochlorperazine (COMPAZINE) 10 MG tablet Take 1 tablet (10 mg total) by mouth every 6 (six) hours as needed for nausea or vomiting. 12/07/22   Rana Snare, NP  trifluridine-tipiracil (LONSURF) 15-6.14 MG tablet Take 3 tablets (45 mg of trifluridine total) by mouth 2 (two) times daily after a meal. Take within 1 hr after AM & PM meals on days 1-5, 8-12. Repeat every 28 days. 06/29/23   Ladene Artist, MD    Physical Exam: Vitals:   07/12/23 1723 07/12/23 1741 07/12/23 2030 07/12/23 2037  BP:   (!) 162/95   Pulse:      Resp:  18   Temp:  100 F (37.8 C)  98.9 F (37.2 C)  TempSrc:  Oral  Rectal  SpO2: 95%     Weight:      Height:       Constitutional: NAD, calm, comfortable, elderly chronically ill-appearing male lying upright in bed Eyes: lids and conjunctivae normal ENMT: Mucous membranes are moist.  Respiratory: clear to auscultation bilaterally, no wheezing, no crackles. Normal respiratory effort. No accessory muscle use.  Cardiovascular: Regular rate and rhythm, no murmurs / rubs / gallops. No extremity edema.  Abdomen: Soft, nontender nondistended.  Firm abdominal mass around umbilical region. Musculoskeletal: no clubbing / cyanosis. No joint deformity upper and lower extremities.  Muscle wasting in all extremities.   Skin: no rashes, lesions, ulcers. No induration Neurologic: CN 2-12 grossly intact.  Able to lift lower extremity against gravity but not resistant. Psychiatric: Normal judgment and insight. Alert and oriented x 3. Normal mood.   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 Truett Perna   Essential hypertension - Continue amlodipine and Coreg  Iron deficiency anemia due to chronic blood loss - Stable.  Hemoglobin of 9.3 with baseline around 7-8      Advance Care Planning:   Code Status: Full Code   Consults: none  Family Communication: wife and son at bedside  Severity of Illness: The appropriate patient status for this patient is INPATIENT. Inpatient status is judged to be reasonable and necessary in order to provide the required intensity of service to ensure the patient's safety. The patient's presenting symptoms, physical exam findings, and initial radiographic and laboratory data in the context of their chronic comorbidities is felt to place them at high risk for further clinical deterioration. Furthermore, it is not anticipated that the patient will be medically stable for discharge from the hospital within 2 midnights of admission.   * I certify that at the point of admission it is my clinical judgment that the patient will require inpatient hospital care spanning beyond 2 midnights from the point of admission due to high intensity of service, high risk for further deterioration and high frequency of surveillance required.*  Author: Anselm Jungling, DO 07/12/2023 11:28 PM  For on call review [email or web address].

## 2023-07-12 NOTE — ED Triage Notes (Addendum)
Patient fell yesterday. Unable to stand for long periods of time, feeling weaker than normal. Patient has colon cancer and getting chemo. Takes lovenox.

## 2023-07-12 NOTE — Assessment & Plan Note (Addendum)
Urine cx growing Klebsiella pneumoniae.  Sensitive to Rocephin. IV rocephin started 07-12-2023. Stopped on 07-15-2023.  Started on [facility] 07-15-2023  Due to N/V and pt refusing to take po meds. IV Ancef started 07-17-2023 by pharmacy recommendations.  Pt completed 7 days of IV/PO abx for uti. Treatment completed.

## 2023-07-12 NOTE — Assessment & Plan Note (Addendum)
Pt refusing amlodipine and Coreg. Pt placed on prn labetalol 10 mg IV q4h prn SBP >170 or DBP >100 since pt continues to refuse medications. 07-19-2023 Add clonidine patch for BP control.  Will DC to SNF on clonidine patch 0.3 mg/24h changed qweekly.

## 2023-07-12 NOTE — ED Notes (Signed)
Patient CBG reading of 19 and 31. MD aware, RN gave orange juice and sandwich to patient. Patient A&O no s/s of hypoglycemia.

## 2023-07-12 NOTE — Progress Notes (Signed)
Call to [facility] Patient connection. He is approved for free drug. Hope to be received at provider office by Wednesday (9/11). They have had shipment delays due to product delays and holiday.

## 2023-07-12 NOTE — Assessment & Plan Note (Addendum)
W/hepatic and abdominal wall metastasis has progression of disease. oncology has recommended against starting salvage therapy with Lonsurf. Pt performance status is poor. Pt with overall very poor prognosis. Oncology has discussed prognosis with pt's wife. She understands that pt is not candidate for chemo any longer. Today(07-17-2023) I explained to wife that pt is dying from his progressive cancer. Nothing can stop that. She seems to understand it but then asks if his appetite will improve. I explained to her that pt's appetite will likely not improve as his cancer is progressive.

## 2023-07-12 NOTE — Assessment & Plan Note (Signed)
Resolved after IV and oral potassium replacement.

## 2023-07-12 NOTE — Assessment & Plan Note (Addendum)
06/23/2023-right iliac/femoral DVT and pulmonary emboli  continue therapeutic Lovenox Pt has been refusing lovenox for several days.  Will continue lovenox at SNF but if pt continues to refuse, this may have to be stopped. Wife is aware of risk of VTE due to pt's continued refusal of systemic anticoagulation.

## 2023-07-12 NOTE — Assessment & Plan Note (Signed)
stable °

## 2023-07-13 LAB — CBC
HCT: 30 % — ABNORMAL LOW (ref 39.0–52.0)
Hemoglobin: 9.1 g/dL — ABNORMAL LOW (ref 13.0–17.0)
MCH: 24 pg — ABNORMAL LOW (ref 26.0–34.0)
MCHC: 30.3 g/dL (ref 30.0–36.0)
MCV: 79.2 fL — ABNORMAL LOW (ref 80.0–100.0)
Platelets: 200 10*3/uL (ref 150–400)
RBC: 3.79 MIL/uL — ABNORMAL LOW (ref 4.22–5.81)
WBC: 6.2 10*3/uL (ref 4.0–10.5)
nRBC: 0 % (ref 0.0–0.2)

## 2023-07-13 LAB — BLOOD CULTURE ID PANEL (REFLEXED) - BCID2

## 2023-07-13 LAB — BASIC METABOLIC PANEL
Anion gap: 6 (ref 5–15)
BUN: 19 mg/dL (ref 8–23)
CO2: 27 mmol/L (ref 22–32)
Calcium: 7.1 mg/dL — ABNORMAL LOW (ref 8.9–10.3)
Chloride: 104 mmol/L (ref 98–111)
Creatinine, Ser: 0.69 mg/dL (ref 0.61–1.24)
GFR, Estimated: >60 mL/min (ref >60)
Glucose, Bld: 85 mg/dL (ref 70–99)
Potassium: 3.6 mmol/L (ref 3.5–5.1)
Sodium: 137 mmol/L (ref 135–145)

## 2023-07-13 LAB — CBG MONITORING, ED
Glucose-Capillary: 82 mg/dL (ref 70–99)
Glucose-Capillary: 84 mg/dL (ref 70–99)

## 2023-07-13 LAB — GLUCOSE, CAPILLARY
Glucose-Capillary: 119 mg/dL — ABNORMAL HIGH (ref 70–99)
Glucose-Capillary: 90 mg/dL (ref 70–99)

## 2023-07-13 MED ORDER — ONDANSETRON HCL 4 MG/2ML IJ SOLN
4.0000 mg | Freq: Once | INTRAMUSCULAR | Status: AC | PRN
  Administered 2023-07-13: 4 mg via INTRAVENOUS
  Filled 2023-07-13: qty 2

## 2023-07-13 MED ORDER — SODIUM CHLORIDE 0.9 % IV SOLN
2.0000 g | INTRAVENOUS | Status: DC
  Administered 2023-07-13 – 2023-07-14 (×2): 2 g via INTRAVENOUS
  Filled 2023-07-13 (×2): qty 20

## 2023-07-13 NOTE — ED Notes (Addendum)
ED TO INPATIENT HANDOFF REPORT  ED Nurse Name and Phone #: Michio Thier  S Name/Age/Gender Aaron Roth 77 y.o. male Room/Bed: WA25/WA25  Code Status   Code Status: Full Code  Home/SNF/Other Home Patient oriented to: self, place, and time Is this baseline? No   Triage Complete: Triage complete  Chief Complaint Acute metabolic encephalopathy [G93.41]  Triage Note Patient fell yesterday. Unable to stand for long periods of time, feeling weaker than normal. Patient has colon cancer and getting chemo. Takes lovenox.   Allergies Allergies  Allergen Reactions   Hydralazine Itching   Other     Pre-breaded shrimp   Oxaliplatin Itching    Patient had hypersensitivity reaction to Oxaliplatin. See progress note from 04/19/2023 at 1830. Patient did not complete infusion.    Level of Care/Admitting Diagnosis ED Disposition     ED Disposition  Admit   Condition  --   Comment  Hospital Area: [facility] [[id number]]  Level of Care: Telemetry [5]  Admit to tele based on following criteria: Other see comments  Comments: hypokalemia  May admit patient to [facility] or [facility] if equivalent level of care is available:: No  Covid Evaluation: Asymptomatic - no recent exposure (last 10 days) testing not required  Diagnosis: Acute metabolic encephalopathy [[id number]]  Admitting Physician: Anselm Jungling [[id number]]  Attending Physician: Anselm Jungling [[id number]]  Certification:: I certify this patient will need inpatient services for at least 2 midnights  Expected Medical Readiness: 07/14/2023          B Medical/Surgery History Past Medical History:  Diagnosis Date   Anemia    07-2020   Cancer (HCC) 07-01-12   colon-surgery and chemotherapy   Colon cancer, 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   Essential hypertension 08/10/2020   GERD (gastroesophageal reflux disease)    pt. denies   Past Surgical History:  Procedure Laterality Date   BIOPSY  08/04/2020   Procedure: BIOPSY;  Surgeon: Bernette Redbird, MD;  Location: [facility];  Service: Endoscopy;;  EGD and COLON   COLONOSCOPY  06/03/2012   Procedure: COLONOSCOPY;  Surgeon: Kandis Cocking, MD;  Location: [facility];  Service: General;  Laterality: N/A;   COLONOSCOPY WITH PROPOFOL N/A 08/04/2020   Procedure: COLONOSCOPY WITH PROPOFOL;  Surgeon: Bernette Redbird, MD;  Location: [facility];  Service: Endoscopy;  Laterality: N/A;   COLOSTOMY  09/25/2011   Procedure: COLOSTOMY;  Surgeon: Kandis Cocking, MD;  Location: [facility];  Service: General;  Laterality: N/A;   COLOSTOMY TAKEDOWN  07/14/2012   Procedure: LAPAROSCOPIC COLOSTOMY TAKEDOWN;  Surgeon: Kandis Cocking, MD;  Location: [facility];  Service: General;  Laterality: N/A;  laparoscopic coverted to open takedown of colostomy   ESOPHAGOGASTRODUODENOSCOPY (EGD) WITH PROPOFOL N/A 08/04/2020   Procedure: ESOPHAGOGASTRODUODENOSCOPY (EGD) WITH PROPOFOL;  Surgeon: Bernette Redbird, MD;  Location: [facility];  Service: Endoscopy;  Laterality: N/A;   GANGLION CYST EXCISION     wrist and foot   IR IMAGING GUIDED PORT INSERTION  12/04/2022   LAPAROSCOPIC PARTIAL COLECTOMY N/A 10/17/2020   Procedure: LAPAROSCOPIC RIGHT COLECTOMY;  Surgeon: Romie Levee, MD;  Location: [facility];  Service: General;  Laterality: N/A;   PARTIAL COLECTOMY  09/25/2011   Procedure: PARTIAL COLECTOMY;  Surgeon: Onalee Hua  Jena Gauss, MD;  Location: [facility];  Service: General;;   POLYPECTOMY  08/04/2020   Procedure: POLYPECTOMY;  Surgeon: Bernette Redbird, MD;  Location: [facility];  Service: Endoscopy;;   PORT-A-CATH REMOVAL  07/14/2012   Procedure: REMOVAL PORT-A-CATH;  Surgeon: Kandis Cocking, MD;  Location: [facility];  Service: General;  Laterality: N/A;   PORTACATH  PLACEMENT  11/10/2011   Procedure: INSERTION PORT-A-CATH;  Surgeon: Kandis Cocking, MD;  Location: [facility];  Service: General;  Laterality: Left;  left subclavian   PORTACATH PLACEMENT  11/10/2011     A IV Location/Drains/Wounds Patient Lines/Drains/Airways Status     Active Line/Drains/Airways     Name Placement date Placement time Site Days   Implanted Port 12/04/22 Right Chest 12/04/22  1223  Chest  221   Incision - 3 Ports Abdomen Left;Upper Left;Lower Right;Lower 10/17/20  1139  -- 999            Intake/Output Last 24 hours No intake or output data in the 24 hours ending 07/13/23 1203  Labs/Imaging Results for orders placed or performed during the hospital encounter of 07/12/23 (from the past 48 hour(s))  Troponin I (High Sensitivity)     Status: Abnormal   Collection Time: 07/12/23  6:15 PM  Result Value Ref Range   Troponin I (High Sensitivity) 34 (H) <18 ng/L    Comment: (NOTE) Elevated high sensitivity troponin I (hsTnI) values and significant  changes across serial measurements may suggest ACS but many other  chronic and acute conditions are known to elevate hsTnI results.  Refer to the "Links" section for chest pain algorithms and additional  guidance. Performed at [facility], [address], [place], [place] [address]   Magnesium     Status: None   Collection Time: 07/12/23  6:15 PM  Result Value Ref Range   Magnesium 2.1 1.7 - 2.4 mg/dL    Comment: Performed at [facility], [address] [place], [place] [address]  Comprehensive metabolic panel     Status: Abnormal   Collection Time: 07/12/23  6:15 PM  Result Value Ref Range   Sodium 137 135 - 145 mmol/L   Potassium 2.9 (L) 3.5 - 5.1 mmol/L   Chloride 102 98 - 111 mmol/L   CO2 27 22 - 32 mmol/L   Glucose, Bld 72 70 - 99 mg/dL    Comment: Glucose reference range applies only to samples taken after fasting for at least 8 hours.   BUN 21 8 - 23  mg/dL   Creatinine, Ser 0.98 0.61 - 1.24 mg/dL   Calcium 7.1 (L) 8.9 - 10.3 mg/dL   Total Protein 4.7 (L) 6.5 - 8.1 g/dL   Albumin <1.1 (L) 3.5 - 5.0 g/dL   AST 17 15 - 41 U/L   ALT 13 0 - 44 U/L   Alkaline Phosphatase 97 38 - 126 U/L   Total Bilirubin 0.7 0.3 - 1.2 mg/dL   GFR, Estimated >91 >47 mL/min    Comment: (NOTE) Calculated using the CKD-EPI Creatinine Equation (2021)    Anion gap 8 5 - 15    Comment: Performed at [facility], [address] [place], [place] [address]  CBC with Differential     Status: Abnormal   Collection Time: 07/12/23  6:15 PM  Result Value Ref Range   WBC 6.0 4.0 - 10.5 K/uL   RBC 3.93 (L) 4.22 - 5.81 MIL/uL   Hemoglobin 9.3 (L) 13.0 -  17.0 g/dL   HCT 82.9 (L) 56.2 - 13.0 %   MCV 79.6 (L) 80.0 - 100.0 fL   MCH 23.7 (L) 26.0 - 34.0 pg   MCHC 29.7 (L) 30.0 - 36.0 g/dL   RDW Not Measured 86.5 - 15.5 %   Platelets 212 150 - 400 K/uL   nRBC 0.0 0.0 - 0.2 %   Neutrophils Relative % 73 %   Neutro Abs 4.3 1.7 - 7.7 K/uL   Lymphocytes Relative 20 %   Lymphs Abs 1.2 0.7 - 4.0 K/uL   Monocytes Relative 7 %   Monocytes Absolute 0.4 0.1 - 1.0 K/uL   Eosinophils Relative 0 %   Eosinophils Absolute 0.0 0.0 - 0.5 K/uL   Basophils Relative 0 %   Basophils Absolute 0.0 0.0 - 0.1 K/uL   Immature Granulocytes 0 %   Abs Immature Granulocytes 0.02 0.00 - 0.07 K/uL   Ovalocytes PRESENT     Comment: Performed at [facility], [address], [place], [place] [address]  Protime-INR     Status: Abnormal   Collection Time: 07/12/23  6:15 PM  Result Value Ref Range   Prothrombin Time 15.7 (H) 11.4 - 15.2 seconds   INR 1.2 0.8 - 1.2    Comment: (NOTE) INR goal varies based on device and disease states. Performed at [facility], [address] [place], [place] [address]   APTT     Status: None   Collection Time: 07/12/23  6:15 PM  Result Value Ref Range   aPTT 36 24 - 36 seconds    Comment: Performed  at [facility], [address] [place], [place] [address]  Urinalysis, w/ Reflex to Culture (Infection Suspected) -Urine, Clean Catch     Status: Abnormal   Collection Time: 07/12/23  6:15 PM  Result Value Ref Range   Specimen Source URINE, CLEAN CATCH    Color, Urine YELLOW YELLOW   APPearance HAZY (A) CLEAR   Specific Gravity, Urine 1.021 1.005 - 1.030   pH 5.0 5.0 - 8.0   Glucose, UA NEGATIVE NEGATIVE mg/dL   Hgb urine dipstick SMALL (A) NEGATIVE   Bilirubin Urine NEGATIVE NEGATIVE   Ketones, ur NEGATIVE NEGATIVE mg/dL   Protein, ur 30 (A) NEGATIVE mg/dL   Nitrite POSITIVE (A) NEGATIVE   Leukocytes,Ua LARGE (A) NEGATIVE   RBC / HPF 11-20 0 - 5 RBC/hpf   WBC, UA >50 0 - 5 WBC/hpf    Comment:        Reflex urine culture not performed if WBC <=10, OR if Squamous epithelial cells >5. If Squamous epithelial cells >5 suggest recollection.    Bacteria, UA MANY (A) NONE SEEN   Squamous Epithelial / HPF 0-5 0 - 5 /HPF   WBC Clumps PRESENT    Mucus PRESENT    Non Squamous Epithelial 0-5 (A) NONE SEEN    Comment: Performed at [facility], [address] [place], [place] [address]  Brain natriuretic peptide     Status: Abnormal   Collection Time: 07/12/23  6:15 PM  Result Value Ref Range   B Natriuretic Peptide 114.0 (H) 0.0 - 100.0 pg/mL    Comment: Performed at [facility], [address], [place], [place] [address]  CBG monitoring, ED     Status: Abnormal   Collection Time: 07/12/23  6:43 PM  Result Value Ref Range   Glucose-Capillary 19 (LL) 70 - 99 mg/dL    Comment: Glucose  reference range applies only to samples taken after fasting for at least 8 hours.   Comment 1 Notify RN   I-Stat Lactic Acid, ED     Status: None   Collection Time: 07/12/23  6:44 PM  Result Value Ref Range   Lactic Acid, Venous 1.0 0.5 - 1.9 mmol/L  CBG monitoring, ED     Status: Abnormal   Collection Time: 07/12/23  6:50 PM  Result Value Ref  Range   Glucose-Capillary 31 (LL) 70 - 99 mg/dL    Comment: Glucose reference range applies only to samples taken after fasting for at least 8 hours.   Comment 1 Notify RN   CBG monitoring, ED (now and then every hour for 3 hours)     Status: Abnormal   Collection Time: 07/12/23  7:17 PM  Result Value Ref Range   Glucose-Capillary 13 (LL) 70 - 99 mg/dL    Comment: Glucose reference range applies only to samples taken after fasting for at least 8 hours.   Comment 1 Document in Chart   CBG monitoring, ED (now and then every hour for 3 hours)     Status: Abnormal   Collection Time: 07/12/23  7:23 PM  Result Value Ref Range   Glucose-Capillary 179 (H) 70 - 99 mg/dL    Comment: Glucose reference range applies only to samples taken after fasting for at least 8 hours.  I-Stat Lactic Acid, ED     Status: None   Collection Time: 07/12/23  7:45 PM  Result Value Ref Range   Lactic Acid, Venous 1.2 0.5 - 1.9 mmol/L  Blood Culture (routine x 2)     Status: None (Preliminary result)   Collection Time: 07/12/23  8:25 PM   Specimen: BLOOD RIGHT HAND  Result Value Ref Range   Specimen Description      BLOOD RIGHT HAND Performed at [facility], [address], [place], [place] [address]    Special Requests      BOTTLES DRAWN AEROBIC AND ANAEROBIC Blood Culture adequate volume Performed at [facility], [address], [place], [place] [address]    Culture  Setup Time      GRAM NEGATIVE RODS AEROBIC BOTTLE ONLY Organism ID to follow Performed at [facility], [address] [place], [place] [address]    Culture GRAM NEGATIVE RODS    Report Status PENDING   POC occult blood, ED     Status: Abnormal   Collection Time: 07/12/23  8:40 PM  Result Value Ref Range   Fecal Occult Bld POSITIVE (A) NEGATIVE  CBG monitoring, ED (now and then every hour for 3 hours)     Status: Abnormal   Collection Time: 07/12/23 11:27 PM  Result Value Ref Range    Glucose-Capillary 126 (H) 70 - 99 mg/dL    Comment: Glucose reference range applies only to samples taken after fasting for at least 8 hours.  CBG monitoring, ED     Status: None   Collection Time: 07/13/23  4:41 AM  Result Value Ref Range   Glucose-Capillary 82 70 - 99 mg/dL    Comment: Glucose reference range applies only to samples taken after fasting for at least 8 hours.  CBC     Status: Abnormal   Collection Time: 07/13/23  4:43 AM  Result Value Ref Range   WBC 6.2 4.0 - 10.5 K/uL   RBC 3.79 (L) 4.22 - 5.81 MIL/uL   Hemoglobin 9.1 (L) 13.0 - 17.0  g/dL   HCT 09.8 (L) 11.9 - 14.7 %   MCV 79.2 (L) 80.0 - 100.0 fL   MCH 24.0 (L) 26.0 - 34.0 pg   MCHC 30.3 30.0 - 36.0 g/dL   RDW Not Measured 82.9 - 15.5 %   Platelets 200 150 - 400 K/uL   nRBC 0.0 0.0 - 0.2 %    Comment: Performed at [facility], [address], [place], [place] [address]  Basic metabolic panel     Status: Abnormal   Collection Time: 07/13/23  4:43 AM  Result Value Ref Range   Sodium 137 135 - 145 mmol/L   Potassium 3.6 3.5 - 5.1 mmol/L   Chloride 104 98 - 111 mmol/L   CO2 27 22 - 32 mmol/L   Glucose, Bld 85 70 - 99 mg/dL    Comment: Glucose reference range applies only to samples taken after fasting for at least 8 hours.   BUN 19 8 - 23 mg/dL   Creatinine, Ser 0.86 0.61 - 1.24 mg/dL   Calcium 7.1 (L) 8.9 - 10.3 mg/dL   GFR, Estimated >57 >84 mL/min    Comment: (NOTE) Calculated using the CKD-EPI Creatinine Equation (2021)    Anion gap 6 5 - 15    Comment: Performed at [facility], [address] [place], [place] [address]  CBG monitoring, ED     Status: None   Collection Time: 07/13/23  9:25 AM  Result Value Ref Range   Glucose-Capillary 84 70 - 99 mg/dL    Comment: Glucose reference range applies only to samples taken after fasting for at least 8 hours.   DG Chest Port 1 View  Result Date: 07/12/2023 CLINICAL DATA:  Questionable sepsis EXAM: PORTABLE CHEST 1  VIEW COMPARISON:  Chest x-ray 08/02/2020 FINDINGS: Right chest port catheter tip ends in the SVC. Cardiomediastinal silhouette is stable. The lungs are clear. There is no pleural effusion or pneumothorax. No acute fractures are seen. IMPRESSION: No active disease. Electronically Signed   By: Darliss Cheney M.D.   On: 07/12/2023 20:26   CT Head Wo Contrast  Result Date: 07/12/2023 CLINICAL DATA:  Altered mental status and fall EXAM: CT HEAD WITHOUT CONTRAST TECHNIQUE: Contiguous axial images were obtained from the base of the skull through the vertex without intravenous contrast. RADIATION DOSE REDUCTION: This exam was performed according to the departmental dose-optimization program which includes automated exposure control, adjustment of the mA and/or kV according to patient size and/or use of iterative reconstruction technique. COMPARISON:  None Available. FINDINGS: Brain: There is no mass, hemorrhage or extra-axial collection. The appearance of the white matter is normal for the patient's age. There is moderate generalized atrophy. Vascular: No abnormal hyperdensity of the major intracranial arteries or dural venous sinuses. No intracranial atherosclerosis. Skull: Small right parietal scalp hematoma.  No skull fracture. Sinuses/Orbits: No fluid levels or advanced mucosal thickening of the visualized paranasal sinuses. No mastoid or middle ear effusion. The orbits are normal. IMPRESSION: 1. No acute intracranial abnormality. 2. Small right parietal scalp hematoma without skull fracture. Electronically Signed   By: Deatra Robinson M.D.   On: 07/12/2023 20:08    Pending Labs Unresulted Labs (From admission, onward)     Start     Ordered   07/12/23 2025  Blood Culture ID Panel (Reflexed)  Once,   STAT        07/12/23 2025   07/12/23 1815  Urine Culture  Once,   R  07/12/23 1815   07/12/23 1742  Blood Culture (routine x 2)  (Undifferentiated presentation (screening labs and basic nursing orders))   BLOOD CULTURE X 2,   STAT      07/12/23 1742            Vitals/Pain Today's Vitals   07/13/23 0430 07/13/23 0700 07/13/23 0845 07/13/23 0900  BP:   (!) 138/91 (!) 138/91  Pulse:    75  Resp: 15 12 12 18   Temp:    98.1 F (36.7 C)  TempSrc:    Oral  SpO2:    100%  Weight:      Height:      PainSc:        Isolation Precautions No active isolations  Medications Medications  sodium chloride flush (NS) 0.9 % injection 3 mL (3 mLs Intravenous Given 07/12/23 2204)  sodium chloride flush (NS) 0.9 % injection 3 mL (has no administration in time range)  0.9 %  sodium chloride infusion (has no administration in time range)  dextrose 50 % solution (0 g  Hold 07/12/23 1925)  cefTRIAXone (ROCEPHIN) 1 g in sodium chloride 0.9 % 100 mL IVPB (0 g Intravenous Stopped 07/13/23 0010)  amLODipine (NORVASC) tablet 10 mg (10 mg Oral Given 07/13/23 1102)  carvedilol (COREG) tablet 25 mg (has no administration in time range)  ferrous sulfate tablet 325 mg (has no administration in time range)  potassium chloride SA (KLOR-CON M) CR tablet 20 mEq (0 mEq Oral Hold 07/12/23 2325)  0.9 %  sodium chloride infusion ( Intravenous New Bag/Given 07/13/23 0017)  enoxaparin (LOVENOX) injection 100 mg (100 mg Subcutaneous Patient Refused/Not Given 07/13/23 0200)  enoxaparin (LOVENOX) injection 100 mg (has no administration in time range)  lactated ringers bolus 500 mL (0 mLs Intravenous Stopped 07/12/23 2151)  dextrose 50 % solution 50 mL (50 mLs Intravenous Given 07/12/23 1859)  potassium chloride SA (KLOR-CON M) CR tablet 40 mEq (40 mEq Oral Given 07/12/23 2151)  potassium chloride 10 mEq in 100 mL IVPB (0 mEq Intravenous Stopped 07/12/23 2312)  calcium gluconate 1 g/ 50 mL sodium chloride IVPB (0 mg Intravenous Stopped 07/13/23 0156)    Mobility walks with device     Focused Assessments Cardiac Assessment Handoff:    No results found for: "CKTOTAL", "CKMB", "CKMBINDEX", "TROPONINI" No results found for:  "DDIMER" Does the Patient currently have chest pain? No    R Recommendations: See Admitting Provider Note  Report given to:   Additional Notes: pt normally ambulatory, here with increased weakness. Pt does have bedbugs.

## 2023-07-13 NOTE — Progress Notes (Signed)
PHARMACY - PHYSICIAN COMMUNICATION CRITICAL VALUE ALERT - BLOOD CULTURE IDENTIFICATION (BCID)  Aaron Roth is an 77 y.o. male who presented to [facility] on 07/12/2023 with a chief complaint of weakness & fell yesterday  Assessment:  1/3 blood culture bottles (aerobic bottle) - GNR - Kleb pneumo no resistance per BCID > presumed urinary source  Name of physician (or Provider) Contacted: B Dahal via secure chat  Current antibiotics: ceftriaxone 1 gm IV q24  Changes to prescribed antibiotics recommended:  Increase ceftriaxone to 2 gm IV q24  Results for orders placed or performed during the hospital encounter of 07/12/23  Blood Culture ID Panel (Reflexed) (Collected: 07/12/2023  8:25 PM)  Result Value Ref Range   Enterococcus faecalis NOT DETECTED NOT DETECTED   Enterococcus Faecium NOT DETECTED NOT DETECTED   Listeria monocytogenes NOT DETECTED NOT DETECTED   Staphylococcus species NOT DETECTED NOT DETECTED   Staphylococcus aureus (BCID) NOT DETECTED NOT DETECTED   Staphylococcus epidermidis NOT DETECTED NOT DETECTED   Staphylococcus lugdunensis NOT DETECTED NOT DETECTED   Streptococcus species NOT DETECTED NOT DETECTED   Streptococcus agalactiae NOT DETECTED NOT DETECTED   Streptococcus pneumoniae NOT DETECTED NOT DETECTED   Streptococcus pyogenes NOT DETECTED NOT DETECTED   A.calcoaceticus-baumannii NOT DETECTED NOT DETECTED   Bacteroides fragilis NOT DETECTED NOT DETECTED   Enterobacterales DETECTED (A) NOT DETECTED   Enterobacter cloacae complex NOT DETECTED NOT DETECTED   Escherichia coli NOT DETECTED NOT DETECTED   Klebsiella aerogenes NOT DETECTED NOT DETECTED   Klebsiella oxytoca NOT DETECTED NOT DETECTED   Klebsiella pneumoniae DETECTED (A) NOT DETECTED   Proteus species NOT DETECTED NOT DETECTED   Salmonella species NOT DETECTED NOT DETECTED   Serratia marcescens NOT DETECTED NOT DETECTED   Haemophilus influenzae NOT DETECTED NOT DETECTED   Neisseria meningitidis NOT  DETECTED NOT DETECTED   Pseudomonas aeruginosa NOT DETECTED NOT DETECTED   Stenotrophomonas maltophilia NOT DETECTED NOT DETECTED   Candida albicans NOT DETECTED NOT DETECTED   Candida auris NOT DETECTED NOT DETECTED   Candida glabrata NOT DETECTED NOT DETECTED   Candida krusei NOT DETECTED NOT DETECTED   Candida parapsilosis NOT DETECTED NOT DETECTED   Candida tropicalis NOT DETECTED NOT DETECTED   Cryptococcus neoformans/gattii NOT DETECTED NOT DETECTED   CTX-M ESBL NOT DETECTED NOT DETECTED   Carbapenem resistance IMP NOT DETECTED NOT DETECTED   Carbapenem resistance KPC NOT DETECTED NOT DETECTED   Carbapenem resistance NDM NOT DETECTED NOT DETECTED   Carbapenem resist OXA 48 LIKE NOT DETECTED NOT DETECTED   Carbapenem resistance VIM NOT DETECTED NOT DETECTED    Herby Abraham, Pharm.D Use secure chat for questions 07/13/2023 1:51 PM

## 2023-07-13 NOTE — Progress Notes (Signed)
PT Cancellation Note  Patient Details Name: Aaron Roth MRN: [id number] DOB: 1946/08/12   Cancelled Treatment:    Reason Eval/Treat Not Completed: Fatigue/lethargy limiting ability to participate, just got up to room from ED. Will check back tomorrow. [name] PT Acute Rehabilitation Services Office [phone] Weekend [phone]   [name] 07/13/2023, 4:16 PM

## 2023-07-13 NOTE — ED Notes (Signed)
Pt called out stating he needs to have a BM, went into room to place pt on bedpan. Pt states he would rather wait a little while before attempting bed pan. Wife remains at bedside.

## 2023-07-13 NOTE — Progress Notes (Addendum)
PROGRESS NOTE  Aaron Roth  DOB: Nov 28, 1945  PCP: Storm Frisk, MD AOZ:[id number]  DOA: 07/12/2023  LOS: 1 day  Hospital Day: 2  Brief narrative: Aaron Roth is a 77 y.o. 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 9/9, patient was brought to the ED with complaint of weakness and fall.  The previous night, patient's wife heard him call for help.  She found him on the bathroom floor, laying on his back and unable to get up.  Patient could not recall how he fell but denied passing out.  Per family, patient has been getting weak for the last 3 to 4 days. Subsequently brought to ED  In the ED, patient had a temperature of 100, blood pressure 162/95 Labs showed WC count 6, hemoglobin 9.3, lactic acid 1, troponin 34, potassium low at 2.9, albumin less than 1.5 FOBT positive UA was positive with large leukocyte, positive nitrite many bacteria.   Chest x-ray negative.   While in the ED, he also had several readings of low blood sugar as low as 13.  However per EDP, patient was mentating well throughout raising question on the validity of blood sugar readings.    Subjective: Patient was seen and examined this afternoon. Pleasant elderly male.  Weak but alert, awake and able to have a conversation. Family not at bedside. Chart reviewed. 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  Hemoglobin at baseline close to 9.  Remains stable.  FOBT positive but no gross bleeding.  Continue to monitor Recent Labs    11/05/22 1114 11/05/22 1115 11/18/22 1200 06/15/23 0951 06/23/23 1150 06/28/23 1052 07/12/23 1815 07/13/23 0443  HGB  --   --    < > 7.9* 8.2* 8.4* 9.3* 9.1*  MCV  --   --    < > 69.5* 71.7* 73.4* 79.6* 79.2*  FERRITIN  --  3*  --   --   --   --   --   --   RETICCTPCT 1.3  --   --   --   --   --   --   --    < > = values in this interval not displayed.    Hypokalemia Potassium level was significantly low at 2.9.  Improved with replacement given Recent Labs  Lab 07/12/23 1815 07/13/23 0443  K 2.9* 3.6  MG 2.1  --    Hypocalcemia Calcium and albumin levels low.  IV calcium replacement was given on admission Recent Labs    05/04/23  1007 06/01/23 1304 06/15/23 0951 06/28/23 1052 07/12/23 1815 07/13/23 0443  CALCIUM 8.5* 7.6* 7.5* 7.0* 7.1* 7.1*  ALBUMIN 3.1* 2.5* 2.3* 2.0* <1.5*  --      Low blood sugar readings Likely erroneous.  Mentating okay throughout Blood sugar reading on BMP this morning was at 85  Essential hypertension Continue amlodipine and Coreg    Mobility: PT eval ordered  Goals of care   Code Status: Full Code     DVT prophylaxis: Therapeutic Lovenox    Antimicrobials: IV Rocephin Fluid: NS at 75 mL/h Consultants: None Family Communication: None at bedside.  Called and updated patient's son Mr. Monterrius Hafler  Status: Inpatient Level of care:  Telemetry   Patient is from: Home Needs to continue  in-hospital care: Ongoing workup, needs IV antibiotics Anticipated d/c to: Pending clinical course      Diet:  Diet Order             DIET DYS 2 Room service appropriate? Yes; Fluid consistency: Thin  Diet effective now                   Scheduled Meds:  amLODipine  10 mg Oral Daily   carvedilol  25 mg Oral BID WC   enoxaparin (LOVENOX) injection  100 mg Subcutaneous NOW   enoxaparin (LOVENOX) injection  100 mg Subcutaneous Q24H   ferrous sulfate  325 mg Oral BID WC   potassium chloride  20 mEq Oral BID   sodium chloride flush  3 mL Intravenous Q12H    PRN meds: sodium chloride, sodium chloride flush   Infusions:   sodium chloride     cefTRIAXone (ROCEPHIN)  IV      Antimicrobials: Anti-infectives (From admission, onward)    Start     Dose/Rate Route Frequency Ordered Stop   07/13/23 1400  cefTRIAXone (ROCEPHIN) 2 g in sodium chloride 0.9 % 100 mL IVPB        2 g 200 mL/hr over 30 Minutes Intravenous Every 24 hours 07/13/23 1349     07/12/23 2300  cefTRIAXone (ROCEPHIN) 1 g in sodium chloride 0.9 % 100 mL IVPB  Status:  Discontinued        1 g 200 mL/hr over 30 Minutes Intravenous Every 24 hours 07/12/23 2234 07/13/23 1349       Objective: Vitals:   07/13/23 0845 07/13/23 0900  BP: (!) 138/91 (!) 138/91  Pulse:  75  Resp: 12 18  Temp:  98.1 F (36.7 C)  SpO2:  100%   No intake or output data in the 24 hours ending 07/13/23 1536 Filed Weights   07/12/23 1722  Weight: 68.9 kg   Weight change:  Body mass index is 23.81 kg/m.   Physical Exam: General exam: Pleasant elderly African-American male.  Not in pain.  Looks weak Skin: No rashes, lesions or ulcers. HEENT: Atraumatic, normocephalic, no obvious bleeding Lungs: Clear to auscultation bilaterally CVS: Regular rate and rhythm, no murmur GI/Abd has tender abdominal mass palpable anteriorly, bowel sound present CNS: Alert, awake, able to answer questions Psychiatry: Sad affect Extremities:  No pedal edema, no calf tenderness  Data Review: I have personally reviewed the laboratory data and studies available.  F/u labs ordered Unresulted Labs (From admission, onward)     Start     Ordered   07/14/23 0500  Basic metabolic panel  Tomorrow morning,   R        07/13/23 1535   07/14/23 0500  CBC with Differential/Platelet  Tomorrow morning,   R        07/13/23 1535   07/12/23 1815  Urine Culture  Once,   R        07/12/23 1815   07/12/23 1742  Blood Culture (routine x 2)  (Undifferentiated presentation (screening labs and basic nursing orders))  BLOOD CULTURE X 2,   STAT      07/12/23 1742            Total time spent in review of labs and imaging, patient evaluation, formulation of plan, documentation and communication with family: 55 minutes  Signed, Lorin Glass, MD [facility] 07/13/2023

## 2023-07-13 NOTE — ED Notes (Addendum)
Pt incontinent of stool. Pericare performed and new brief placed. Pt pants removed. Pt refusing bath and to remove his shirt or socks.

## 2023-07-14 ENCOUNTER — Encounter (HOSPITAL_COMMUNITY): Payer: Self-pay | Admitting: Family Medicine

## 2023-07-14 DIAGNOSIS — N390 Urinary tract infection, site not specified: Principal | ICD-10-CM

## 2023-07-14 DIAGNOSIS — A414 Sepsis due to anaerobes: Secondary | ICD-10-CM

## 2023-07-14 DIAGNOSIS — B9689 Other specified bacterial agents as the cause of diseases classified elsewhere: Secondary | ICD-10-CM

## 2023-07-14 LAB — CBC WITH DIFFERENTIAL/PLATELET
Abs Immature Granulocytes: 0.02 10*3/uL (ref 0.00–0.07)
Basophils Absolute: 0 10*3/uL (ref 0.0–0.1)
Basophils Relative: 0 %
Eosinophils Absolute: 0 10*3/uL (ref 0.0–0.5)
Eosinophils Relative: 1 %
HCT: 28.4 % — ABNORMAL LOW (ref 39.0–52.0)
Hemoglobin: 8.6 g/dL — ABNORMAL LOW (ref 13.0–17.0)
Immature Granulocytes: 0 %
Lymphocytes Relative: 30 %
Lymphs Abs: 1.6 10*3/uL (ref 0.7–4.0)
MCH: 24.2 pg — ABNORMAL LOW (ref 26.0–34.0)
MCHC: 30.3 g/dL (ref 30.0–36.0)
MCV: 79.8 fL — ABNORMAL LOW (ref 80.0–100.0)
Monocytes Absolute: 0.3 10*3/uL (ref 0.1–1.0)
Monocytes Relative: 6 %
Neutro Abs: 3.4 10*3/uL (ref 1.7–7.7)
Neutrophils Relative %: 63 %
Platelets: 164 10*3/uL (ref 150–400)
RBC: 3.56 MIL/uL — ABNORMAL LOW (ref 4.22–5.81)
WBC: 5.4 10*3/uL (ref 4.0–10.5)
nRBC: 0 % (ref 0.0–0.2)

## 2023-07-14 LAB — URINE CULTURE: Culture: >=100000 — AB

## 2023-07-14 LAB — BASIC METABOLIC PANEL
Anion gap: 5 (ref 5–15)
BUN: 19 mg/dL (ref 8–23)
CO2: 27 mmol/L (ref 22–32)
Calcium: 7.1 mg/dL — ABNORMAL LOW (ref 8.9–10.3)
Chloride: 104 mmol/L (ref 98–111)
Creatinine, Ser: 0.86 mg/dL (ref 0.61–1.24)
GFR, Estimated: >60 mL/min (ref >60)
Glucose, Bld: 105 mg/dL — ABNORMAL HIGH (ref 70–99)
Potassium: 3.9 mmol/L (ref 3.5–5.1)
Sodium: 136 mmol/L (ref 135–145)

## 2023-07-14 LAB — GLUCOSE, CAPILLARY
Glucose-Capillary: 52 mg/dL — ABNORMAL LOW (ref 70–99)
Glucose-Capillary: 62 mg/dL — ABNORMAL LOW (ref 70–99)
Glucose-Capillary: 62 mg/dL — ABNORMAL LOW (ref 70–99)
Glucose-Capillary: 80 mg/dL (ref 70–99)
Glucose-Capillary: 135 mg/dL — ABNORMAL HIGH (ref 70–99)
Glucose-Capillary: 24 mg/dL — CL (ref 70–99)
Glucose-Capillary: 27 mg/dL — CL (ref 70–99)

## 2023-07-14 MED ORDER — GLUCOSE 40 % PO GEL
1.0000 | ORAL | Status: AC
  Administered 2023-07-14: 31 g via ORAL
  Filled 2023-07-14: qty 1.21

## 2023-07-14 MED ORDER — SODIUM CHLORIDE 0.9% FLUSH
10.0000 mL | Freq: Two times a day (BID) | INTRAVENOUS | Status: DC
  Administered 2023-07-14 – 2023-07-19 (×9): 10 mL

## 2023-07-14 MED ORDER — ENSURE ENLIVE PO LIQD
237.0000 mL | Freq: Two times a day (BID) | ORAL | Status: DC
  Administered 2023-07-14 – 2023-07-17 (×5): 237 mL via ORAL

## 2023-07-14 MED ORDER — DEXTROSE 50 % IV SOLN
12.5000 g | INTRAVENOUS | Status: AC
  Administered 2023-07-14: 12.5 g via INTRAVENOUS
  Filled 2023-07-14: qty 50

## 2023-07-14 MED ORDER — DRONABINOL 2.5 MG PO CAPS
2.5000 mg | ORAL_CAPSULE | Freq: Every day | ORAL | Status: DC
  Administered 2023-07-14 – 2023-07-16 (×3): 2.5 mg via ORAL
  Filled 2023-07-14 (×3): qty 1

## 2023-07-14 MED ORDER — CHLORHEXIDINE GLUCONATE CLOTH 2 % EX PADS
6.0000 | MEDICATED_PAD | Freq: Every day | CUTANEOUS | Status: DC
  Administered 2023-07-14 – 2023-07-20 (×5): 6 via TOPICAL

## 2023-07-14 NOTE — Progress Notes (Signed)
PROGRESS NOTE    Aaron Roth  ZOX:[id number] DOB: 08-03-1946 DOA: 07/12/2023 PCP: Aaron Frisk, MD  Subjective: Pt seen and examined. Wife at bedside(Aaron Roth). Pt still feels weak. Wife admits that pt's weakness has preceded pt's admission to hospital by several weeks. Agrees that his weakness is not a new problem. Afebrile.   Hospital Course: HPI: Aaron Roth is a 77 y.o. 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   Wife and son provides hx. Wife was asleep and heard pt call for help.Found to all the floor in the bathroom laying on his back and trying to get up. Pt does not really recall the fall but denies loss of consciousness. Son had trouble getting him out of bed so decided to come to ED. Denies any fever or chills.  No nausea, vomiting but has intermittent diarrhea since being on chemotherapy.  Has decreased appetite and has been losing weight.     In the ED, he had temperature of 100 F, blood pressure 130/95 on room air.   CBC without leukocytosis, hemoglobin 9.3 with baseline of 7-8.  Lactate within normal limits.   CMP notable for hypokalemia of 2.9, calcium 7.1 with albumin less than 1.5, creatinine stable at 0.90.  Magnesium within normal limits.   Troponin mildly elevated 34, BNP of 114.   UA was positive with large leukocyte, positive nitrite many bacteria.  Chest x-ray negative.  Positive FOBT likely secondary to his colon cancer as his hemoglobin is stable.   He also had hypoglycemia down to 13 although ED physician questions the validity of the test as patient was mentating well.  He was given D10 and had a repeat glucose that came up to 170s.  Significant Events: Admitted 07/12/2023 BG obtained for port-a-cath showed 135, despite fingerstick BG reading 26.  Significant Labs: Blood cx 07-12-2023 growing Klebsiella Urine cx  07-12-2023 growing Klebsiella pan-sensitive except for ampicillin  Significant Imaging Studies: 07-12-2023 CT head negative for CVA, shows small right parietal scalp hematoma  Antibiotic Therapy: Rocephin started 07-12-2023  Procedures:   Consultants: Oncology - Sherrill    Assessment and Plan: * UTI due to Klebsiella species Urine cx growing Klebsiella pneumoniae.  Sensitive to Rocephin. IV rocephin started 07-12-2023.  Septicemia due to Klebsiella pneumoniae (HCC) On IV rocephin. Awaiting blood cx sensitivities.  Weakness Secondary to UTI and dehydration Continue with PT  History of DVT (deep vein thrombosis) Continue with therapeutic dose lovenox.  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 BG from port-a-cath was normal despite CBG from finger saying BG was low. Stop accuchecks.  Hypokalemia Resolved after IV and oral potassium replacement.  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 oncology recommended against starting salvage therapy with [facility] unless pt improves his performance status. Oncologist(sherrill) had discussion regarding with patient and wife about Hospice.  Pt declined hospice referral.  Pt with overall very poor prognosis. I think part of issue is poor health literacy. Pt and wife both still think that salvage therapy can cure him.  Wife asking about medications to help his appetite. Will give a trial of marinol.  Essential hypertension Continue amlodipine and Coreg  Iron deficiency anemia due to chronic blood loss - Stable.  Hemoglobin of 8.6 with baseline around 7-8  DVT prophylaxis:   Lovenox   Code Status: Full Code Family Communication: discussed with pt and wife Aaron Roth at bedside Disposition Plan: return home Reason for continuing need for  hospitalization: continued IV rocephin for Klebsiella septicemia  Objective: Vitals:   07/13/23 1631 07/13/23 2150 07/14/23 0433 07/14/23 1520  BP: (!) 127/108 131/78 (!) 129/99 (!) 142/99  Pulse: 85 74 68 67  Resp: 16 18 18 20   Temp: 98.3 F (36.8 C) 97.8 F (36.6 C) 97.6 F (36.4 C)   TempSrc: Oral Oral Oral   SpO2:  100%  100%  Weight:      Height:        Intake/Output Summary (Last 24 hours) at 07/14/2023 1530 Last data filed at 07/14/2023 0300 Gross per 24 hour  Intake 100 ml  Output --  Net 100 ml   Filed Weights   07/12/23 1722  Weight: 68.9 kg    Examination:  Physical Exam Vitals and nursing note reviewed.  Constitutional:      General: He is not in acute distress.    Appearance: He is not toxic-appearing.     Comments: Thin, frail, chronically ill appearing AAM.  HENT:     Head: Normocephalic and atraumatic.     Nose: Nose normal.  Eyes:     General: No scleral icterus. Cardiovascular:     Rate and Rhythm: Normal rate and regular rhythm.  Pulmonary:     Effort: Pulmonary effort is normal.     Breath sounds: Normal breath sounds.  Abdominal:     General: Bowel sounds are normal.     Tenderness: There is no abdominal tenderness.     Comments: Multiple soft tissue masses in his abd that are firm to palpation  Musculoskeletal:     Right lower leg: No edema.     Left lower leg: No edema.  Skin:    General: Skin is warm and dry.     Capillary Refill: Capillary refill takes less than 2 seconds.  Neurological:     Mental Status: He is oriented to person, place, and time.     Data Reviewed: I have personally reviewed following labs and imaging studies  CBC: Recent Labs  Lab 07/12/23 1815 07/13/23 0443 07/14/23 0459  WBC 6.0 6.2 5.4  NEUTROABS 4.3  --  3.4  HGB 9.3* 9.1* 8.6*  HCT 31.3* 30.0* 28.4*  MCV 79.6* 79.2* 79.8*  PLT 212 200 164   Basic Metabolic Panel: Recent Labs  Lab 07/12/23 1815 07/13/23 0443 07/14/23 0459  NA 137 137 136   K 2.9* 3.6 3.9  CL 102 104 104  CO2 27 27 27   GLUCOSE 72 85 105*  BUN 21 19 19   CREATININE 0.90 0.69 0.86  CALCIUM 7.1* 7.1* 7.1*  MG 2.1  --   --    GFR: Estimated Creatinine Clearance: 65.1 mL/min (by C-G formula based on SCr of 0.86 mg/dL). Liver Function Tests: Recent Labs  Lab 07/12/23 1815  AST 17  ALT 13  ALKPHOS 97  BILITOT 0.7  PROT 4.7*  ALBUMIN <1.5*   Coagulation Profile: Recent Labs  Lab 07/12/23 1815  INR 1.2   CBG: Recent Labs  Lab 07/14/23 0621 07/14/23 0734 07/14/23 1159 07/14/23 1205 07/14/23 1218  GLUCAP 62* 80 24* 27* 135*   Sepsis Labs: Recent Labs  Lab 07/12/23 1844 07/12/23 1945  LATICACIDVEN 1.0 1.2    Recent Results (from the past 240 hour(s))  Urine Culture     Status: Abnormal  Collection Time: 07/12/23  6:15 PM   Specimen: Urine, Random  Result Value Ref Range Status   Specimen Description   Final    URINE, RANDOM Performed at [facility], [address], [place], [place] [address]    Special Requests   Final    NONE Reflexed from [id number] Performed at [facility], [address], [place], [place] [address]    Culture >=100,000 COLONIES/mL KLEBSIELLA PNEUMONIAE (A)  Final   Report Status 07/14/2023 FINAL  Final   Organism ID, Bacteria KLEBSIELLA PNEUMONIAE (A)  Final      Susceptibility   Klebsiella pneumoniae - MIC*    AMPICILLIN RESISTANT Resistant     CEFAZOLIN <=4 SENSITIVE Sensitive     CEFEPIME <=0.12 SENSITIVE Sensitive     CEFTRIAXONE <=0.25 SENSITIVE Sensitive     CIPROFLOXACIN <=0.25 SENSITIVE Sensitive     GENTAMICIN <=1 SENSITIVE Sensitive     IMIPENEM <=0.25 SENSITIVE Sensitive     NITROFURANTOIN 32 SENSITIVE Sensitive     TRIMETH/SULFA <=20 SENSITIVE Sensitive     AMPICILLIN/SULBACTAM 4 SENSITIVE Sensitive     PIP/TAZO <=4 SENSITIVE Sensitive     * >=100,000 COLONIES/mL KLEBSIELLA PNEUMONIAE  Blood Culture (routine x 2)     Status: None (Preliminary result)    Collection Time: 07/12/23  8:25 PM   Specimen: BLOOD RIGHT HAND  Result Value Ref Range Status   Specimen Description   Final    BLOOD RIGHT HAND Performed at [facility], [address] [place], [place] [address]    Special Requests   Final    BOTTLES DRAWN AEROBIC AND ANAEROBIC Blood Culture adequate volume Performed at [facility], [address] [place] [address], [place], [place] [address]    Culture  Setup Time   Final    GRAM NEGATIVE RODS AEROBIC BOTTLE ONLY CRITICAL RESULT CALLED TO, READ BACK BY AND VERIFIED WITH: PHARMD J LEGGE [id number] AT 1332 BY CM Performed at [facility], [address], [place], [place] [address]    Culture GRAM NEGATIVE RODS  Final   Report Status PENDING  Incomplete  Blood Culture ID Panel (Reflexed)     Status: Abnormal   Collection Time: 07/12/23  8:25 PM  Result Value Ref Range Status   Enterococcus faecalis NOT DETECTED NOT DETECTED Final   Enterococcus Faecium NOT DETECTED NOT DETECTED Final   Listeria monocytogenes NOT DETECTED NOT DETECTED Final   Staphylococcus species NOT DETECTED NOT DETECTED Final   Staphylococcus aureus (BCID) NOT DETECTED NOT DETECTED Final   Staphylococcus epidermidis NOT DETECTED NOT DETECTED Final   Staphylococcus lugdunensis NOT DETECTED NOT DETECTED Final   Streptococcus species NOT DETECTED NOT DETECTED Final   Streptococcus agalactiae NOT DETECTED NOT DETECTED Final   Streptococcus pneumoniae NOT DETECTED NOT DETECTED Final   Streptococcus pyogenes NOT DETECTED NOT DETECTED Final   A.calcoaceticus-baumannii NOT DETECTED NOT DETECTED Final   Bacteroides fragilis NOT DETECTED NOT DETECTED Final   Enterobacterales DETECTED (A) NOT DETECTED Final    Comment: Enterobacterales represent a large order of gram negative bacteria, not a single organism. CRITICAL RESULT CALLED TO, READ BACK BY AND VERIFIED WITH: PHARMD J LEGGE [id number] AT 1332 BY CM    Enterobacter cloacae complex NOT  DETECTED NOT DETECTED Final   Escherichia coli NOT DETECTED NOT DETECTED Final   Klebsiella aerogenes NOT DETECTED NOT DETECTED Final   Klebsiella oxytoca NOT DETECTED NOT DETECTED Final   Klebsiella pneumoniae DETECTED (A) NOT DETECTED Final  Comment: CRITICAL RESULT CALLED TO, READ BACK BY AND VERIFIED WITH: PHARMD J LEGGE [id number] AT 1332 BY CM    Proteus species NOT DETECTED NOT DETECTED Final   Salmonella species NOT DETECTED NOT DETECTED Final   Serratia marcescens NOT DETECTED NOT DETECTED Final   Haemophilus influenzae NOT DETECTED NOT DETECTED Final   Neisseria meningitidis NOT DETECTED NOT DETECTED Final   Pseudomonas aeruginosa NOT DETECTED NOT DETECTED Final   Stenotrophomonas maltophilia NOT DETECTED NOT DETECTED Final   Candida albicans NOT DETECTED NOT DETECTED Final   Candida auris NOT DETECTED NOT DETECTED Final   Candida glabrata NOT DETECTED NOT DETECTED Final   Candida krusei NOT DETECTED NOT DETECTED Final   Candida parapsilosis NOT DETECTED NOT DETECTED Final   Candida tropicalis NOT DETECTED NOT DETECTED Final   Cryptococcus neoformans/gattii NOT DETECTED NOT DETECTED Final   CTX-M ESBL NOT DETECTED NOT DETECTED Final   Carbapenem resistance IMP NOT DETECTED NOT DETECTED Final   Carbapenem resistance KPC NOT DETECTED NOT DETECTED Final   Carbapenem resistance NDM NOT DETECTED NOT DETECTED Final   Carbapenem resist OXA 48 LIKE NOT DETECTED NOT DETECTED Final   Carbapenem resistance VIM NOT DETECTED NOT DETECTED Final    Comment: Performed at [facility], [address], [place], [place] [address]     Radiology Studies: DG Chest Port 1 View  Result Date: 07/12/2023 CLINICAL DATA:  Questionable sepsis EXAM: PORTABLE CHEST 1 VIEW COMPARISON:  Chest x-ray 08/02/2020 FINDINGS: Right chest port catheter tip ends in the SVC. Cardiomediastinal silhouette is stable. The lungs are clear. There is no pleural effusion or pneumothorax. No acute fractures are seen.  IMPRESSION: No active disease. Electronically Signed   By: Darliss Cheney M.D.   On: 07/12/2023 20:26   CT Head Wo Contrast  Result Date: 07/12/2023 CLINICAL DATA:  Altered mental status and fall EXAM: CT HEAD WITHOUT CONTRAST TECHNIQUE: Contiguous axial images were obtained from the base of the skull through the vertex without intravenous contrast. RADIATION DOSE REDUCTION: This exam was performed according to the departmental dose-optimization program which includes automated exposure control, adjustment of the mA and/or kV according to patient size and/or use of iterative reconstruction technique. COMPARISON:  None Available. FINDINGS: Brain: There is no mass, hemorrhage or extra-axial collection. The appearance of the white matter is normal for the patient's age. There is moderate generalized atrophy. Vascular: No abnormal hyperdensity of the major intracranial arteries or dural venous sinuses. No intracranial atherosclerosis. Skull: Small right parietal scalp hematoma.  No skull fracture. Sinuses/Orbits: No fluid levels or advanced mucosal thickening of the visualized paranasal sinuses. No mastoid or middle ear effusion. The orbits are normal. IMPRESSION: 1. No acute intracranial abnormality. 2. Small right parietal scalp hematoma without skull fracture. Electronically Signed   By: Deatra Robinson M.D.   On: 07/12/2023 20:08    Scheduled Meds:  amLODipine  10 mg Oral Daily   carvedilol  25 mg Oral BID WC   Chlorhexidine Gluconate Cloth  6 each Topical Daily   dronabinol  2.5 mg Oral QAC lunch   enoxaparin (LOVENOX) injection  100 mg Subcutaneous Q24H   feeding supplement  237 mL Oral BID BM   ferrous sulfate  325 mg Oral BID WC   potassium chloride  20 mEq Oral BID   sodium chloride flush  3 mL Intravenous Q12H   Continuous Infusions:  sodium chloride     cefTRIAXone (ROCEPHIN)  IV 2 g (07/14/23 1347)     LOS:  2 days   Time spent: 45 minutes  Carollee Herter, DO  [facility]  07/14/2023, 3:30 PM

## 2023-07-14 NOTE — Subjective & Objective (Addendum)
Pt seen and examined. Wife not at bedside Pt continues to refuse medications. Awaiting SNF placement.

## 2023-07-14 NOTE — Plan of Care (Signed)
Patient is alert and oriented, BG check on bilateral fingers 24 and 27. Fingers are cold. MD Anders Simmonds and BG rechecked from port-137. Patient asymptomatic, eating food with wife at the bedside.  Problem: Education: Goal: Knowledge of General Education information will improve Description: Including pain rating scale, medication(s)/side effects and non-pharmacologic comfort measures Outcome: Progressing   Problem: Health Behavior/Discharge Planning: Goal: Ability to manage health-related needs will improve Outcome: Progressing   Problem: Clinical Measurements: Goal: Ability to maintain clinical measurements within normal limits will improve Outcome: Progressing Goal: Will remain free from infection Outcome: Progressing Goal: Diagnostic test results will improve Outcome: Progressing Goal: Respiratory complications will improve Outcome: Progressing Goal: Cardiovascular complication will be avoided Outcome: Progressing   Problem: Activity: Goal: Risk for activity intolerance will decrease Outcome: Progressing   Problem: Nutrition: Goal: Adequate nutrition will be maintained Outcome: Progressing   Problem: Coping: Goal: Level of anxiety will decrease Outcome: Progressing   Problem: Elimination: Goal: Will not experience complications related to bowel motility Outcome: Progressing Goal: Will not experience complications related to urinary retention Outcome: Progressing   Problem: Pain Managment: Goal: General experience of comfort will improve Outcome: Progressing   Problem: Safety: Goal: Ability to remain free from injury will improve Outcome: Progressing   Problem: Skin Integrity: Goal: Risk for impaired skin integrity will decrease Outcome: Progressing

## 2023-07-14 NOTE — Hospital Course (Addendum)
HPI: Aaron Roth is a 77 y.o. 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   Wife and son provides hx. Wife was asleep and heard pt call for help.Found to all the floor in the bathroom laying on his back and trying to get up. Pt does not really recall the fall but denies loss of consciousness. Son had trouble getting him out of bed so decided to come to ED. Denies any fever or chills.  No nausea, vomiting but has intermittent diarrhea since being on chemotherapy.  Has decreased appetite and has been losing weight.     In the ED, he had temperature of 100 F, blood pressure 130/95 on room air.   CBC without leukocytosis, hemoglobin 9.3 with baseline of 7-8.  Lactate within normal limits.   CMP notable for hypokalemia of 2.9, calcium 7.1 with albumin less than 1.5, creatinine stable at 0.90.  Magnesium within normal limits.   Troponin mildly elevated 34, BNP of 114.   UA was positive with large leukocyte, positive nitrite many bacteria.  Chest x-ray negative.  Positive FOBT likely secondary to his colon cancer as his hemoglobin is stable.   He also had hypoglycemia down to 13 although ED physician questions the validity of the test as patient was mentating well.  He was given D10 and had a repeat glucose that came up to 170s.  Significant Events: Admitted 07/12/2023 BG obtained for port-a-cath showed 135, despite fingerstick BG reading 26. 07-14-2023 pt started on Marinol for appetite stimulation Pt made DNR/DNI by palliative care on 07-15-2023  Significant Labs: Blood cx 07-12-2023 growing Klebsiella Urine cx 07-12-2023 growing Klebsiella pan-sensitive except for ampicillin  Significant Imaging Studies: 07-12-2023 CT head negative for CVA, shows small right parietal scalp hematoma  Antibiotic Therapy: Anti-infectives (From admission, onward)    Start     Dose/Rate  Route Frequency Ordered Stop   07/17/23 1800  ceFAZolin (ANCEF) IVPB 2g/100 mL premix        2 g 200 mL/hr over 30 Minutes Intravenous Every 8 hours 07/17/23 1422     07/17/23 0815  cefTRIAXone (ROCEPHIN) 2 g in sodium chloride 0.9 % 100 mL IVPB  Status:  Discontinued        2 g 200 mL/hr over 30 Minutes Intravenous Daily 07/17/23 0756 07/17/23 1422   07/15/23 1000  cefadroxil (DURICEF) capsule 1,000 mg  Status:  Discontinued        1,000 mg Oral 2 times daily 07/15/23 0839 07/17/23 0756   07/13/23 1400  cefTRIAXone (ROCEPHIN) 2 g in sodium chloride 0.9 % 100 mL IVPB  Status:  Discontinued        2 g 200 mL/hr over 30 Minutes Intravenous Every 24 hours 07/13/23 1349 07/15/23 0839   07/12/23 2300  cefTRIAXone (ROCEPHIN) 1 g in sodium chloride 0.9 % 100 mL IVPB  Status:  Discontinued        1 g 200 mL/hr over 30 Minutes Intravenous Every 24 hours 07/12/23 2234 07/13/23 1349       Procedures:   Consultants: Oncology - [facility]

## 2023-07-14 NOTE — Progress Notes (Signed)
IP PROGRESS NOTE  Subjective:   Aaron Roth is well-known to me with a history of metastatic colon cancer.  Restaging CTs 06/23/2023 revealed progressive disease.  We recommended hospice care, but Aaron Roth 1 to continue treatment.  He was prescribed Lonsurf.  There was a delay in beginning Lonsurf due to his lack of insurance.  Lonsurf was scheduled to be delivered and started today. Aaron Roth presented to the emergency room 07/12/2023 with weakness confusion, and a fall.  A urinalysis was consistent with a urinary tract infection. He was admitted and placed on antibiotics.  Blood and urine cultures were positive for Klebsiella pneumonia.  Aaron Roth has no specific complaint this morning.  Objective: Vital signs in last 24 hours: Blood pressure (!) 129/99, pulse 68, temperature 97.6 F (36.4 C), temperature source Oral, resp. rate 18, height 5\' 7"  (1.702 m), weight 152 lb (68.9 kg), SpO2 100%.  Intake/Output from previous day: 09/10 0701 - 09/11 0700 In: 100 [IV Piggyback:100] Out: -   Physical Exam:  HEENT: No thrush Abdomen: Firm mass occupying the mid abdomen, no hepatomegaly Extremities: No leg edema Neurologic: Alert, follows commands, not oriented to diagnosis  Portacath/PICC-without erythema  Lab Results: Recent Labs    07/13/23 0443 07/14/23 0459  WBC 6.2 5.4  HGB 9.1* 8.6*  HCT 30.0* 28.4*  PLT 200 164    BMET Recent Labs    07/13/23 0443 07/14/23 0459  NA 137 136  K 3.6 3.9  CL 104 104  CO2 27 27  GLUCOSE 85 105*  BUN 19 19  CREATININE 0.69 0.86  CALCIUM 7.1* 7.1*    Lab Results  Component Value Date   CEA1 2.0 08/04/2020   CEA 8.65 (H) 06/28/2023    Studies/Results: DG Chest Port 1 View  Result Date: 07/12/2023 CLINICAL DATA:  Questionable sepsis EXAM: PORTABLE CHEST 1 VIEW COMPARISON:  Chest x-ray 08/02/2020 FINDINGS: Right chest port catheter tip ends in the SVC. Cardiomediastinal silhouette is stable. The lungs are clear. There is no pleural  effusion or pneumothorax. No acute fractures are seen. IMPRESSION: No active disease. Electronically Signed   By: Darliss Cheney M.D.   On: 07/12/2023 20:26   CT Head Wo Contrast  Result Date: 07/12/2023 CLINICAL DATA:  Altered mental status and fall EXAM: CT HEAD WITHOUT CONTRAST TECHNIQUE: Contiguous axial images were obtained from the base of the skull through the vertex without intravenous contrast. RADIATION DOSE REDUCTION: This exam was performed according to the departmental dose-optimization program which includes automated exposure control, adjustment of the mA and/or kV according to patient size and/or use of iterative reconstruction technique. COMPARISON:  None Available. FINDINGS: Brain: There is no mass, hemorrhage or extra-axial collection. The appearance of the white matter is normal for the patient's age. There is moderate generalized atrophy. Vascular: No abnormal hyperdensity of the major intracranial arteries or dural venous sinuses. No intracranial atherosclerosis. Skull: Small right parietal scalp hematoma.  No skull fracture. Sinuses/Orbits: No fluid levels or advanced mucosal thickening of the visualized paranasal sinuses. No mastoid or middle ear effusion. The orbits are normal. IMPRESSION: 1. No acute intracranial abnormality. 2. Small right parietal scalp hematoma without skull fracture. Electronically Signed   By: Deatra Robinson M.D.   On: 07/12/2023 20:08    Medications: I have reviewed the patient's current medications.  Assessment/Plan:  Colon cancer  CT abdomen/pelvis 08/02/2020-small amount of free fluid in the pelvis, postsurgical change involving the left colon, trace right pleural fluid.   Colonoscopy 08/04/2020-cecal mass as  well as a 3 mm polyp in the ascending colon, 9 mm polyp in the ascending colon, 3 mm polyp in the transverse colon and 4 small polyps in the transverse colon.  Biopsy of the cecal mass showed adenocarcinoma.  Pathology on the polyps showed tubular  adenomas with no high-grade dysplasia or malignancy identified. CT chest 08/04/2020-no evidence of metastatic disease CEA 08/04/2020-2.0 Laparoscopic right colectomy 10/17/2020 by Dr. Nigel Mormon showed adenocarcinoma with mucinous features, moderately differentiated, 6.7 cm; no carcinoma identified and 23 lymph nodes; margins uninvolved; no macroscopic tumor perforation identified; tumor invaded through muscularis propria into pericolorectal tissue; no lymphovascular invasion and no perineural invasion, pT3, pN0, mismatch repair protein IHC normal, MSI stable.   06/18/2022-CT abdomen/pelvis-homogenous soft tissue mass in the anterior abdomen deep to the umbilicus, 5 mm left lower lobe nodule, new from October 2021 11/04/2022 biopsy of abdominal mass-metastatic mucinous adenocarcinoma, mismatch repair protein IHC normal, foundation 1 pending CTs 11/18/2022-large mass central lower abdomen is increased in size.  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  No evidence of obstruction.  Irregular lesions right lobe of the liver likely new, compatible with metastatic disease.  Increase size of a solid left lower lobe pulmonary nodule likely due to metastatic disease.  Solid pulmonary nodule left upper lobe increased in size likely due to indolent primary lung malignancy. Foundation 1 testing (date of collection 10/17/2020, specimen received 11/24/2022)-microsatellite status is equivocal; tumor mutation burden 6; K-ras wild-type; NRAS G12S; BRAF [id number] Cycle 1 FOLFIRI 12/07/2022 Chemotherapy held 12/21/2022 due to neutropenia Chemotherapy held 12/28/2022 per patient due to upcoming dental extractions Cycle 2 FOLFIRI 01/11/2023, irinotecan dose reduced secondary to diarrhea neutropenia, Udenyca Cycle 3 FOLFIRI 01/25/2023 Cycle 4 FOLFIRI 02/08/2023 Cycle 5 FOLFIRI 02/22/2023 CTs 02/24/2023-progression of liver metastases;  similar to minimal enlargement of dominant anterior abdominopelvic wall mass with 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, new trace perihepatic ascites Cycle 1 FOLFOX 03/15/2023 Cycle 2 FOLFOX 04/06/2023 Cycle 3 FOLFOX 04/19/2023 Cycle 4 FOLFOX 05/05/2023, oxaliplatin diluted in a larger volume and infusion time extended, premedications adjusted due to pruritus during cycle 3 Cycle 5 FOLFOX 06/02/2023 CTs 06/23/2023-pulmonary emboli, stable pulmonary nodules, new trace bilateral pleural effusions, mild enlargement of hepatic metastases and the anterior abdominal wall mass, fistulous communication of the mass and bowel Cycle 1 Lonsurf scheduled to start 07/14/2023 Iron deficiency anemia October 2021  08/02/2020 hemoglobin 3.8, MCV 56, ferritin 3, stool positive for occult blood 10/19/2020 hemoglobin 7.3, MCV 74 11/04/2022 hemoglobin 4.5 11/05/2022 ferritin 3 Ferrous sulfate 325 mg twice daily recommended 11/06/2022 2 units packed red blood cells 11/17/2022 stool cards positive for blood 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 He completed cycle 1 CAPOX beginning 11/13/2011; cycle 7 beginning 03/23/2012. Cycle 8 was completed beginning on 04/13/2012 without oxaliplatin. Restaging CT scans 06/06/2012 without evidence of recurrent disease.   Family history-brother with colon cancer Multiple polyps on colonoscopy 08/04/2020  Port-A-Cath placement 12/04/2022, Interventional Radiology 06/23/2023-right iliac/femoral DVT and pulmonary emboli Lovenox starting 06/23/2023 8.  Admission 07/12/2023 with urosepsis 9.  Altered mental status secondary to delirium from sepsis  Aaron Roth has metastatic colon cancer.  There was evidence of disease progression on restaging CTs 06/23/2023.  He was found to have pulmonary emboli and is being treated with Lovenox.  Aaron Roth is now admitted with urosepsis.  His  performance status was declining prior to the episode of urosepsis.  Systemic treatment options are limited.  We recommended hospice care when we saw him on 06/28/2023.  He decided to proceed with salvage Lonsurf therapy.  He has not started Lonsurf.  Aaron Roth has a poor performance status.  I do not recommend a trial of Lonsurf unless he has significant improvement in his performance status over the next week.  I recommend hospice care.  I tried to discuss CODE STATUS with him this morning, but he was confused.  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  He has not been compliant with iron therapy.  I am available to discuss the case with his family when they are available.  Recommendations: Antibiotics per the medical service Continue Lovenox anticoagulation Continue iron Continue discussions with Mr. Hackl and his family regarding the poor prognosis and recommendation for hospice care     LOS: 2 days   Thornton Papas, MD   07/14/2023, 6:36 AM

## 2023-07-14 NOTE — Plan of Care (Signed)
  Problem: Education: Goal: Knowledge of General Education information will improve Description: Including pain rating scale, medication(s)/side effects and non-pharmacologic comfort measures Outcome: Progressing   Problem: Health Behavior/Discharge Planning: Goal: Ability to manage health-related needs will improve Outcome: Progressing   Problem: Coping: Goal: Level of anxiety will decrease Outcome: Progressing   Problem: Pain Managment: Goal: General experience of comfort will improve Outcome: Progressing   Problem: Skin Integrity: Goal: Risk for impaired skin integrity will decrease Outcome: Progressing   

## 2023-07-14 NOTE — Assessment & Plan Note (Addendum)
Continue with therapeutic dose lovenox. Pt has been refusing lovenox.   Wife is aware of risk of recurrent PE/DVT without systemic anticoagulation

## 2023-07-14 NOTE — Evaluation (Signed)
Physical Therapy Evaluation Patient Details Name: KUNAAL MALAVE MRN: [id number] DOB: [birth year] Today's Date: 07/14/2023  History of Present Illness  77 yo male presents to therapy following hospital admission secondary to fall and progressive weakness over the past 3-4 days. Pt was found to have elevated tempeture and BP. Pt was found to have UTI and started on ABX. Pt PMH includes but is not limited to: colon ca and currently on chemo, 06/23/2023 R LE DVT and PE, HTN, GERD, anemia, and HTN.  Clinical Impression    Pt admitted with above diagnosis.  Pt currently with functional limitations due to the deficits listed below (see PT Problem List). Pt in bed when PT arrived. Pt agreeable to therapy intervention. Pt required increased time for all communication, motor processing and planing and is very motivated to complete functional mobility IND. Pt required mod A for bed mobility, reported dizziness seated EOB (see below for Bp), mod A for sit to stand and min A for SPT bed to recliner. Pt left seated in recliner and all needs in place. Wife arrived at end of PT eval and able to provide insight to PLOF due to pt apparent confusion attributed to UTI and pt became agitated when PT inquired per home set up and PLOF. Patient will benefit from continued follow up therapy, <3 hours/day at time of d/c. Pt will benefit from acute skilled PT to increase their independence and safety with mobility to allow discharge.     Bp semi reclined in bed 152/103 (91 PR) Bp Seated EOB 113/97 (74 PR) Unable to obtain Bp in standing due to poor balance and standing tolerance, Bp s/p returning to EOB 123/83 (90 PR)     If plan is discharge home, recommend the following: A lot of help with walking and/or transfers;A lot of help with bathing/dressing/bathroom;Assistance with cooking/housework;Assist for transportation;Help with stairs or ramp for entrance;Supervision due to cognitive status   Can travel by private vehicle         Equipment Recommendations Rolling walker (2 wheels)  Recommendations for Other Services       Functional Status Assessment Patient has had a recent decline in their functional status and demonstrates the ability to make significant improvements in function in a reasonable and predictable amount of time.     Precautions / Restrictions Precautions Precautions: Fall Restrictions Weight Bearing Restrictions: No      Mobility  Bed Mobility Overal bed mobility: Needs Assistance Bed Mobility: Supine to Sit     Supine to sit: Mod assist, HOB elevated, Used rails     General bed mobility comments: increased time with pt reporting he wants to do it himself. pt reported dizziness with inital supine to sit and requested to return to bed, pt encouraged and education provided  on importance of increased OOB time and BP assessed semi reclined and in sitting, unable to obtain in standing due to poor standing toleranace    Transfers Overall transfer level: Needs assistance Equipment used: None Transfers: Sit to/from Stand, Bed to chair/wheelchair/BSC Sit to Stand: Mod assist, From elevated surface Stand pivot transfers: Min assist, From elevated surface         General transfer comment: initial trial with sit to stand pt required mod A, strong posterior lean and use of posterior distal  B LE on bed for stability, second trial for SPT bed to recliner pt required min A, increased time and multimodal cues with pt pulling on arm rest facing recliner then turning regardless of  cues    Ambulation/Gait               General Gait Details: NT due to required A, reports of dizziness  Stairs            Wheelchair Mobility     Tilt Bed    Modified Rankin (Stroke Patients Only)       Balance Overall balance assessment: Needs assistance, History of Falls Sitting-balance support: Feet supported Sitting balance-Leahy Scale: Fair     Standing balance support: Single  extremity supported, During functional activity, Reliant on assistive device for balance Standing balance-Leahy Scale: Poor                               Pertinent Vitals/Pain Pain Assessment Pain Assessment: Faces Faces Pain Scale: Hurts a little bit Pain Location: R knee Pain Descriptors / Indicators: Aching, Constant, Discomfort Pain Intervention(s): Limited activity within patient's tolerance, Monitored during session    Home Living Family/patient expects to be discharged to:: Private residence Living Arrangements: Spouse/significant other Available Help at Discharge: Family Type of Home: House Home Access: Stairs to enter Entrance Stairs-Rails: Right Entrance Stairs-Number of Steps: 2 (1 step to enter from the front of the home with posts to hold onto)   Home Layout: One level        Prior Function Prior Level of Function : Needs assist;History of Falls (last six months)       Physical Assist : ADLs (physical)   ADLs (physical): Bathing;Toileting Mobility Comments: pt is a poor historian and wife present at end of eval to provide insight to PLOF. pt amb short household distances without AD. wife reports pt is declining to use RW. ADLs Comments: wife reports occational issues with loose bowels, pt donning adult absorbent undergarments but does need assist if incont episode occurs     Extremity/Trunk Assessment        Lower Extremity Assessment Lower Extremity Assessment: Generalized weakness    Cervical / Trunk Assessment Cervical / Trunk Assessment:  (wfl)  Communication   Communication Communication: No apparent difficulties  Cognition Arousal: Alert Behavior During Therapy: Agitated, Impulsive Overall Cognitive Status: Impaired/Different from baseline Area of Impairment: Attention, Memory, Safety/judgement, Following commands                               General Comments: pt reprots he feels confused and attributed to UTI at  this time. pt oreinted to self and hospital. pt requires increased time for response and motor processing and planning        General Comments General comments (skin integrity, edema, etc.): B LE edema, L UE edema    Exercises     Assessment/Plan    PT Assessment Patient needs continued PT services  PT Problem List Decreased strength;Decreased activity tolerance;Decreased balance;Decreased mobility;Decreased coordination;Decreased safety awareness;Pain       PT Treatment Interventions DME instruction;Gait training;Stair training;Functional mobility training;Therapeutic activities;Therapeutic exercise;Balance training;Neuromuscular re-education;Patient/family education    PT Goals (Current goals can be found in the Care Plan section)  Acute Rehab PT Goals PT Goal Formulation: Patient unable to participate in goal setting    Frequency Min 1X/week     Co-evaluation               AM-PAC PT "6 Clicks" Mobility  Outcome Measure Help needed turning from your back to your side while in a  flat bed without using bedrails?: A Little Help needed moving from lying on your back to sitting on the side of a flat bed without using bedrails?: A Lot Help needed moving to and from a bed to a chair (including a wheelchair)?: A Lot Help needed standing up from a chair using your arms (e.g., wheelchair or bedside chair)?: A Lot Help needed to walk in hospital room?: Total Help needed climbing 3-5 steps with a railing? : Total 6 Click Score: 11    End of Session Equipment Utilized During Treatment: Gait belt Activity Tolerance: Patient limited by fatigue;Treatment limited secondary to medical complications (Comment) (dizziness) Patient left: in chair;with call bell/phone within reach;with family/visitor present Nurse Communication: Mobility status PT Visit Diagnosis: Unsteadiness on feet (R26.81);Other abnormalities of gait and mobility (R26.89);Muscle weakness (generalized)  (M62.81);History of falling (Z91.81);Difficulty in walking, not elsewhere classified (R26.2);Pain Pain - Right/Left:  (B R > L) Pain - part of body: Knee;Leg    Time: 1100-1143 PT Time Calculation (min) (ACUTE ONLY): 43 min   Charges:   PT Evaluation $PT Eval Low Complexity: 1 Low PT Treatments $Therapeutic Activity: 23-37 mins PT General Charges $$ ACUTE PT VISIT: 1 Visit         Johnny Bridge, PT Acute Rehab   Jacqualyn Posey 07/14/2023, 2:17 PM

## 2023-07-14 NOTE — Assessment & Plan Note (Addendum)
Started initially on IV rocephin. Blood MIC similar to urine cx. IV rocephin stopped on 07-15-2023.  discussed with pharmacy and decision made to start Duricef 1000 mg bid.  ulture KLEBSIELLA PNEUMONIAE Abnormal   Report Status 07/15/2023 FINAL  Organism ID, Bacteria KLEBSIELLA PNEUMONIAE  Resulting Agency [facility]     Susceptibility    Klebsiella pneumoniae    MIC    AMPICILLIN RESISTANT Resistant    AMPICILLIN/SULBACTAM 4 SENSITIVE Sensitive    CEFEPIME <=0.12 SENS... Sensitive    CEFTAZIDIME <=1 SENSITIVE Sensitive    CEFTRIAXONE <=0.25 SENS... Sensitive    CIPROFLOXACIN <=0.25 SENS... Sensitive    GENTAMICIN <=1 SENSITIVE Sensitive    IMIPENEM <=0.25 SENS... Sensitive    PIP/TAZO <=4 SENSITIVE Sensitive    TRIMETH/SULFA <=20 SENSIT... Sensitive

## 2023-07-15 DIAGNOSIS — Z7189 Other specified counseling: Secondary | ICD-10-CM

## 2023-07-15 DIAGNOSIS — Z515 Encounter for palliative care: Secondary | ICD-10-CM

## 2023-07-15 LAB — CULTURE, BLOOD (ROUTINE X 2): Special Requests: ADEQUATE

## 2023-07-15 MED ORDER — KCL-LACTATED RINGERS 20 MEQ/L IV SOLN
INTRAVENOUS | Status: DC
  Filled 2023-07-15: qty 1000

## 2023-07-15 MED ORDER — POTASSIUM CHLORIDE 2 MEQ/ML IV SOLN
INTRAVENOUS | Status: AC
  Filled 2023-07-15 (×2): qty 1000

## 2023-07-15 MED ORDER — CEFADROXIL 500 MG PO CAPS
1000.0000 mg | ORAL_CAPSULE | Freq: Two times a day (BID) | ORAL | Status: DC
  Administered 2023-07-15 (×2): 1000 mg via ORAL
  Filled 2023-07-15 (×5): qty 2

## 2023-07-15 NOTE — Progress Notes (Addendum)
ANTICOAGULATION CONSULT NOTE -  Follow-up  Pharmacy Consult for Lovenox Indication: DVT/PE  Allergies  Allergen Reactions   Hydralazine Itching   Other     Pre-breaded shrimp   Oxaliplatin Itching    Patient had hypersensitivity reaction to Oxaliplatin. See progress note from 04/19/2023 at 1830. Patient did not complete infusion.    Patient Measurements: Height: 5\' 7"  (170.2 cm) Weight: 68.9 kg (152 lb) IBW/kg (Calculated) : 66.1    Vital Signs: Temp: 98 F (36.7 C) (09/12 0836) Temp Source: Oral (09/12 0836) BP: 153/93 (09/12 0836) Pulse Rate: 65 (09/12 0836)  Labs: Recent Labs    07/12/23 1815 07/13/23 0443 07/14/23 0459  HGB 9.3* 9.1* 8.6*  HCT 31.3* 30.0* 28.4*  PLT 212 200 164  APTT 36  --   --   LABPROT 15.7*  --   --   INR 1.2  --   --   CREATININE 0.90 0.69 0.86  TROPONINIHS 34*  --   --     Estimated Creatinine Clearance: 65.1 mL/min (by C-G formula based on SCr of 0.86 mg/dL).   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 denies    Medications:  PTA Lovenox 90mg  sq q24h - LD 9/8 @ 1830  Assessment: 77 yr male admitted with weakness due to UTI and dehydration.  PMH significant for DVT and PE and on Lovenox PTA  He has chronic iron deficiency anemia secondary to chronic GI blood loss from colon cancer.  Positive FOBT likely secondary to his colon cancer as his hemoglobin is stable.  No bleeding noted.    Goal of Therapy:  Full anticoagulation Monitor platelets by anticoagulation protocol: Yes   Last Labs, yesterday  07/14/2023  - Hgb= 8.6, low but stable - Plt = 164 WNL - Scr = 0.86 (CrCl=65)  Today, 07/15/23  - no bleeding reported per RN  Plan:  - Lovenox 100mg  sq q24h (1.5 mg/kg q24h) - CBC at minimum every 72 hours - monitor for signs/symptoms of bleeding  Aaron Roth, PharmD 07/15/2023,10:35 AM

## 2023-07-15 NOTE — Plan of Care (Signed)
  Problem: Education: Goal: Knowledge of General Education information will improve Description: Including pain rating scale, medication(s)/side effects and non-pharmacologic comfort measures Outcome: Not Progressing   Problem: Activity: Goal: Risk for activity intolerance will decrease Outcome: Not Progressing   Problem: Nutrition: Goal: Adequate nutrition will be maintained Outcome: Not Progressing   

## 2023-07-15 NOTE — Progress Notes (Signed)
IP PROGRESS NOTE  Subjective:   Mr. Shone has no complaint.  He denies pain. Objective: Vital signs in last 24 hours: Blood pressure (!) 146/93, pulse 90, temperature 97.9 F (36.6 C), temperature source Oral, resp. rate 18, height 5\' 7"  (1.702 m), weight 152 lb (68.9 kg), SpO2 96%.  Intake/Output from previous day: 09/11 0701 - 09/12 0700 In: 10 [I.V.:10] Out: 80 [Urine:80]  Physical Exam:  HEENT: No thrush Cardiac: Regular rate and rhythm Lungs: Distant breath sounds anteriorly, no respiratory distress Abdomen: Firm mass occupying the mid abdomen, no hepatomegaly Extremities: Pitting edema at the legs bilaterally Neurologic: Alert, follows commands, not oriented to diagnosis, oriented to year and place    Portacath/PICC-without erythema  Lab Results: Recent Labs    07/13/23 0443 07/14/23 0459  WBC 6.2 5.4  HGB 9.1* 8.6*  HCT 30.0* 28.4*  PLT 200 164    BMET Recent Labs    07/13/23 0443 07/14/23 0459  NA 137 136  K 3.6 3.9  CL 104 104  CO2 27 27  GLUCOSE 85 105*  BUN 19 19  CREATININE 0.69 0.86  CALCIUM 7.1* 7.1*    Lab Results  Component Value Date   CEA1 2.0 08/04/2020   CEA 8.65 (H) 06/28/2023    Studies/Results: No results found.  Medications: I have reviewed the patient's current medications.  Assessment/Plan:  Colon cancer  CT abdomen/pelvis 08/02/2020-small amount of free fluid in the pelvis, postsurgical change involving the left colon, trace right pleural fluid.   Colonoscopy 08/04/2020-cecal mass as well as a 3 mm polyp in the ascending colon, 9 mm polyp in the ascending colon, 3 mm polyp in the transverse colon and 4 small polyps in the transverse colon.  Biopsy of the cecal mass showed adenocarcinoma.  Pathology on the polyps showed tubular adenomas with no high-grade dysplasia or malignancy identified. CT chest 08/04/2020-no evidence of metastatic disease CEA 08/04/2020-2.0 Laparoscopic right colectomy 10/17/2020 by Dr. Nigel Mormon  showed adenocarcinoma with mucinous features, moderately differentiated, 6.7 cm; no carcinoma identified and 23 lymph nodes; margins uninvolved; no macroscopic tumor perforation identified; tumor invaded through muscularis propria into pericolorectal tissue; no lymphovascular invasion and no perineural invasion, pT3, pN0, mismatch repair protein IHC normal, MSI stable.   06/18/2022-CT abdomen/pelvis-homogenous soft tissue mass in the anterior abdomen deep to the umbilicus, 5 mm left lower lobe nodule, new from October 2021 11/04/2022 biopsy of abdominal mass-metastatic mucinous adenocarcinoma, mismatch repair protein IHC normal, foundation 1 pending CTs 11/18/2022-large mass central lower abdomen is increased in size.  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  No evidence of obstruction.  Irregular lesions right lobe of the liver likely new, compatible with metastatic disease.  Increase size of a solid left lower lobe pulmonary nodule likely due to metastatic disease.  Solid pulmonary nodule left upper lobe increased in size likely due to indolent primary lung malignancy. Foundation 1 testing (date of collection 10/17/2020, specimen received 11/24/2022)-microsatellite status is equivocal; tumor mutation burden 6; K-ras wild-type; NRAS G12S; BRAF G466A Cycle 1 FOLFIRI 12/07/2022 Chemotherapy held 12/21/2022 due to neutropenia Chemotherapy held 12/28/2022 per patient due to upcoming dental extractions Cycle 2 FOLFIRI 01/11/2023, irinotecan dose reduced secondary to diarrhea neutropenia, Udenyca Cycle 3 FOLFIRI 01/25/2023 Cycle 4 FOLFIRI 02/08/2023 Cycle 5 FOLFIRI 02/22/2023 CTs 02/24/2023-progression of liver metastases; similar to minimal enlargement of dominant anterior abdominopelvic wall mass with 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, new trace perihepatic  ascites Cycle 1 FOLFOX 03/15/2023 Cycle 2 FOLFOX  04/06/2023 Cycle 3 FOLFOX 04/19/2023 Cycle 4 FOLFOX 05/05/2023, oxaliplatin diluted in a larger volume and infusion time extended, premedications adjusted due to pruritus during cycle 3 Cycle 5 FOLFOX 06/02/2023 CTs 06/23/2023-pulmonary emboli, stable pulmonary nodules, new trace bilateral pleural effusions, mild enlargement of hepatic metastases and the anterior abdominal wall mass, fistulous communication of the mass and bowel Cycle 1 Lonsurf scheduled to start 07/14/2023 Iron deficiency anemia October 2021  08/02/2020 hemoglobin 3.8, MCV 56, ferritin 3, stool positive for occult blood 10/19/2020 hemoglobin 7.3, MCV 74 11/04/2022 hemoglobin 4.5 11/05/2022 ferritin 3 Ferrous sulfate 325 mg twice daily recommended 11/06/2022 2 units packed red blood cells 11/17/2022 stool cards positive for blood 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 He completed cycle 1 CAPOX beginning 11/13/2011; cycle 7 beginning 03/23/2012. Cycle 8 was completed beginning on 04/13/2012 without oxaliplatin. Restaging CT scans 06/06/2012 without evidence of recurrent disease.   Family history-brother with colon cancer Multiple polyps on colonoscopy 08/04/2020  Port-A-Cath placement 12/04/2022, Interventional Radiology 06/23/2023-right iliac/femoral DVT and pulmonary emboli Lovenox starting 06/23/2023 8.  Admission 07/12/2023 with urosepsis 9.  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  There was evidence of disease progression on restaging CTs 06/23/2023.  He was found to have pulmonary emboli and is being treated with Lovenox.  Mr. Stettler is now admitted with urosepsis.  His performance status was declining prior to the episode of urosepsis.  Systemic treatment options are limited.  We recommended hospice care when we saw him on 06/28/2023.  He decided to proceed with salvage  Lonsurf therapy.  He has not started Lonsurf.  Mr. Chimento appears to have a very poor performance status and remains confused.  I do not feel he is a candidate for further chemotherapy.   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  He has not been compliant with iron therapy. I discussed the case with his wife by telephone at approximately 3 PM.  She acknowledges a decline in his performance status.  I explained the poor prognosis and recommend hospice care.  She will talk to him about hospice and CODE STATUS.  She would like to know whether he is safe ambulating in order to return home.  Recommendations: Antibiotics per the medical service Continue Lovenox anticoagulation Continue iron Hospice referral when patient and family are in agreement Continue PT evaluation to assess ability for him to return home Outpatient follow-up will be scheduled at the Cancer center     LOS: 3 days   Thornton Papas, MD   07/15/2023, 7:19 AM

## 2023-07-15 NOTE — Progress Notes (Addendum)
PROGRESS NOTE    Aaron Roth  AOZ:[id number] DOB: 03/21/1946 DOA: 07/12/2023 PCP: Storm Frisk, MD  Subjective: Pt seen and examined. Pt still not very hunger. Wife is not at bedside. Blood cx shows similar MIC for Klebsiella as his urine cx. Pan-sensitive except for ampicillin. Pt afebrile. Was started on marinol yesterday for appetite stimulation. Pt does not feel any different today.   Hospital Course: HPI: Aaron Roth is a 77 y.o. 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   Wife and son provides hx. Wife was asleep and heard pt call for help.Found to all the floor in the bathroom laying on his back and trying to get up. Pt does not really recall the fall but denies loss of consciousness. Son had trouble getting him out of bed so decided to come to ED. Denies any fever or chills.  No nausea, vomiting but has intermittent diarrhea since being on chemotherapy.  Has decreased appetite and has been losing weight.     In the ED, he had temperature of 100 F, blood pressure 130/95 on room air.   CBC without leukocytosis, hemoglobin 9.3 with baseline of 7-8.  Lactate within normal limits.   CMP notable for hypokalemia of 2.9, calcium 7.1 with albumin less than 1.5, creatinine stable at 0.90.  Magnesium within normal limits.   Troponin mildly elevated 34, BNP of 114.   UA was positive with large leukocyte, positive nitrite many bacteria.  Chest x-ray negative.  Positive FOBT likely secondary to his colon cancer as his hemoglobin is stable.   He also had hypoglycemia down to 13 although ED physician questions the validity of the test as patient was mentating well.  He was given D10 and had a repeat glucose that came up to 170s.  Significant Events: Admitted 07/12/2023 BG obtained for port-a-cath showed 135, despite fingerstick BG reading 26. 07-14-2023  pt started on Marinol for appetite stimulation  Significant Labs: Blood cx 07-12-2023 growing Klebsiella Urine cx 07-12-2023 growing Klebsiella pan-sensitive except for ampicillin  Significant Imaging Studies: 07-12-2023 CT head negative for CVA, shows small right parietal scalp hematoma  Antibiotic Therapy: Rocephin started 07-12-2023  Procedures:   Consultants: Oncology - [facility]    Assessment and Plan: * UTI due to Klebsiella species Urine cx growing Klebsiella pneumoniae.  Sensitive to Rocephin. IV rocephin started 07-12-2023.  Septicemia due to Klebsiella pneumoniae (HCC) On IV rocephin. Blood MIC similar to urine cx. Awaiting to discuss with pharmacy about best choice for oral abx conversion.  ulture KLEBSIELLA PNEUMONIAE Abnormal   Report Status 07/15/2023 FINAL  Organism ID, Bacteria KLEBSIELLA PNEUMONIAE  Resulting Agency [facility]     Susceptibility    Klebsiella pneumoniae    MIC    AMPICILLIN RESISTANT Resistant    AMPICILLIN/SULBACTAM 4 SENSITIVE Sensitive    CEFEPIME <=0.12 SENS... Sensitive    CEFTAZIDIME <=1 SENSITIVE Sensitive    CEFTRIAXONE <=0.25 SENS... Sensitive    CIPROFLOXACIN <=0.25 SENS... Sensitive    GENTAMICIN <=1 SENSITIVE Sensitive    IMIPENEM <=0.25 SENS... Sensitive    PIP/TAZO <=4 SENSITIVE Sensitive    TRIMETH/SULFA <=20 SENSIT... Sensitive               Weakness Secondary to UTI and dehydration Continue with PT  History of DVT (deep vein thrombosis) Continue with therapeutic dose lovenox.  History of pulmonary embolus (PE) 06/23/2023-right iliac/femoral DVT and pulmonary  emboli  continue therapeutic Lovenox  Hypoglycemia Resolved. BG from port-a-cath was normal despite CBG from finger saying BG was low. Stopped accuchecks.  Hypokalemia Resolved after IV and oral potassium replacement.  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 oncology has  recommended against starting salvage therapy with Lonsurf unless he has improvement in his performance status. Oncologist(sherrill) had discussion regarding with patient and wife about Hospice.  Pt declined hospice referral.  Pt with overall very poor prognosis. I think part of issue is poor health literacy. Pt and wife both still think that salvage therapy can cure him.  Wife asked about medications to help his appetite. trial of marinol started 07-14-2023. He can go home on marinol.  Essential hypertension Continue amlodipine and Coreg  Iron deficiency anemia due to chronic blood loss - Stable.  Hemoglobin of 8.6 with baseline around 7-8       DVT prophylaxis:   Lovenox   Code Status: Full Code Family Communication: no family at bedside Disposition Plan: return home Reason for continuing need for hospitalization: remains on IV ABX pending discussion with pharmacy on appropriate conversion to oral abx.  Objective: Vitals:   07/14/23 0433 07/14/23 1520 07/14/23 2142 07/15/23 0619  BP: (!) 129/99 (!) 142/99 (!) 149/95 (!) 146/93  Pulse: 68 67 78 90  Resp: 18 20 18 18   Temp: 97.6 F (36.4 C)  (!) 97.5 F (36.4 C) 97.9 F (36.6 C)  TempSrc: Oral  Oral Oral  SpO2:  100% 95% 96%  Weight:      Height:        Intake/Output Summary (Last 24 hours) at 07/15/2023 0834 Last data filed at 07/15/2023 0618 Gross per 24 hour  Intake 10 ml  Output 80 ml  Net -70 ml   Filed Weights   07/12/23 1722  Weight: 68.9 kg    Examination:  Physical Exam Vitals and nursing note reviewed.  Constitutional:      General: He is not in acute distress.    Appearance: He is not toxic-appearing or diaphoretic.     Comments: Thin, frail, chronically ill appearing AAM.  HENT:     Head: Normocephalic and atraumatic.     Nose: Nose normal.  Eyes:     General: No scleral icterus. Cardiovascular:     Rate and Rhythm: Normal rate and regular rhythm.  Pulmonary:     Effort: Pulmonary effort  is normal.     Breath sounds: Normal breath sounds.  Abdominal:     General: Bowel sounds are normal.     Tenderness: There is no abdominal tenderness.     Comments: Multiple soft tissue masses in his abd that are firm to palpation  Musculoskeletal:     Right lower leg: No edema.     Left lower leg: No edema.  Skin:    General: Skin is warm and dry.     Capillary Refill: Capillary refill takes less than 2 seconds.  Neurological:     Mental Status: He is alert and oriented to person, place, and time.     Data Reviewed: I have personally reviewed following labs and imaging studies  CBC: Recent Labs  Lab 07/12/23 1815 07/13/23 0443 07/14/23 0459  WBC 6.0 6.2 5.4  NEUTROABS 4.3  --  3.4  HGB 9.3* 9.1* 8.6*  HCT 31.3* 30.0* 28.4*  MCV 79.6* 79.2* 79.8*  PLT 212 200 164   Basic Metabolic Panel: Recent Labs  Lab  07/12/23 1815 07/13/23 0443 07/14/23 0459  NA 137 137 136  K 2.9* 3.6 3.9  CL 102 104 104  CO2 27 27 27   GLUCOSE 72 85 105*  BUN 21 19 19   CREATININE 0.90 0.69 0.86  CALCIUM 7.1* 7.1* 7.1*  MG 2.1  --   --    GFR: Estimated Creatinine Clearance: 65.1 mL/min (by C-G formula based on SCr of 0.86 mg/dL). Liver Function Tests: Recent Labs  Lab 07/12/23 1815  AST 17  ALT 13  ALKPHOS 97  BILITOT 0.7  PROT 4.7*  ALBUMIN <1.5*   Coagulation Profile: Recent Labs  Lab 07/12/23 1815  INR 1.2   CBG: Recent Labs  Lab 07/14/23 0621 07/14/23 0734 07/14/23 1159 07/14/23 1205 07/14/23 1218  GLUCAP 62* 80 24* 27* 135*   Sepsis Labs: Recent Labs  Lab 07/12/23 1844 07/12/23 1945  LATICACIDVEN 1.0 1.2    Recent Results (from the past 240 hour(s))  Urine Culture     Status: Abnormal   Collection Time: 07/12/23  6:15 PM   Specimen: Urine, Random  Result Value Ref Range Status   Specimen Description   Final    URINE, RANDOM Performed at [facility], [address], [place], [place] [address]    Special Requests   Final     NONE Reflexed from [id number] Performed at [facility], [address] [place], [place] [address]    Culture >=100,000 COLONIES/mL KLEBSIELLA PNEUMONIAE (A)  Final   Report Status 07/14/2023 FINAL  Final   Organism ID, Bacteria KLEBSIELLA PNEUMONIAE (A)  Final      Susceptibility   Klebsiella pneumoniae - MIC*    AMPICILLIN RESISTANT Resistant     CEFAZOLIN <=4 SENSITIVE Sensitive     CEFEPIME <=0.12 SENSITIVE Sensitive     CEFTRIAXONE <=0.25 SENSITIVE Sensitive     CIPROFLOXACIN <=0.25 SENSITIVE Sensitive     GENTAMICIN <=1 SENSITIVE Sensitive     IMIPENEM <=0.25 SENSITIVE Sensitive     NITROFURANTOIN 32 SENSITIVE Sensitive     TRIMETH/SULFA <=20 SENSITIVE Sensitive     AMPICILLIN/SULBACTAM 4 SENSITIVE Sensitive     PIP/TAZO <=4 SENSITIVE Sensitive     * >=100,000 COLONIES/mL KLEBSIELLA PNEUMONIAE  Blood Culture (routine x 2)     Status: Abnormal   Collection Time: 07/12/23  8:25 PM   Specimen: BLOOD RIGHT HAND  Result Value Ref Range Status   Specimen Description   Final    BLOOD RIGHT HAND Performed at [facility], [address], [place], [place] [address]    Special Requests   Final    BOTTLES DRAWN AEROBIC AND ANAEROBIC Blood Culture adequate volume Performed at [facility], [address], [place], [place] [address]    Culture  Setup Time   Final    GRAM NEGATIVE RODS AEROBIC BOTTLE ONLY CRITICAL RESULT CALLED TO, READ BACK BY AND VERIFIED WITH: PHARMD J LEGGE [id number] AT 1332 BY CM Performed at [facility], [address] [place], [place] [address]    Culture KLEBSIELLA PNEUMONIAE (A)  Final   Report Status 07/15/2023 FINAL  Final   Organism ID, Bacteria KLEBSIELLA PNEUMONIAE  Final      Susceptibility   Klebsiella pneumoniae - MIC*    AMPICILLIN RESISTANT Resistant     CEFEPIME <=0.12 SENSITIVE Sensitive     CEFTAZIDIME <=1 SENSITIVE Sensitive     CEFTRIAXONE <=0.25 SENSITIVE Sensitive      CIPROFLOXACIN <=0.25 SENSITIVE Sensitive  GENTAMICIN <=1 SENSITIVE Sensitive     IMIPENEM <=0.25 SENSITIVE Sensitive     TRIMETH/SULFA <=20 SENSITIVE Sensitive     AMPICILLIN/SULBACTAM 4 SENSITIVE Sensitive     PIP/TAZO <=4 SENSITIVE Sensitive     * KLEBSIELLA PNEUMONIAE  Blood Culture ID Panel (Reflexed)     Status: Abnormal   Collection Time: 07/12/23  8:25 PM  Result Value Ref Range Status   Enterococcus faecalis NOT DETECTED NOT DETECTED Final   Enterococcus Faecium NOT DETECTED NOT DETECTED Final   Listeria monocytogenes NOT DETECTED NOT DETECTED Final   Staphylococcus species NOT DETECTED NOT DETECTED Final   Staphylococcus aureus (BCID) NOT DETECTED NOT DETECTED Final   Staphylococcus epidermidis NOT DETECTED NOT DETECTED Final   Staphylococcus lugdunensis NOT DETECTED NOT DETECTED Final   Streptococcus species NOT DETECTED NOT DETECTED Final   Streptococcus agalactiae NOT DETECTED NOT DETECTED Final   Streptococcus pneumoniae NOT DETECTED NOT DETECTED Final   Streptococcus pyogenes NOT DETECTED NOT DETECTED Final   A.calcoaceticus-baumannii NOT DETECTED NOT DETECTED Final   Bacteroides fragilis NOT DETECTED NOT DETECTED Final   Enterobacterales DETECTED (A) NOT DETECTED Final    Comment: Enterobacterales represent a large order of gram negative bacteria, not a single organism. CRITICAL RESULT CALLED TO, READ BACK BY AND VERIFIED WITH: PHARMD J LEGGE [id number] AT 1332 BY CM    Enterobacter cloacae complex NOT DETECTED NOT DETECTED Final   Escherichia coli NOT DETECTED NOT DETECTED Final   Klebsiella aerogenes NOT DETECTED NOT DETECTED Final   Klebsiella oxytoca NOT DETECTED NOT DETECTED Final   Klebsiella pneumoniae DETECTED (A) NOT DETECTED Final    Comment: CRITICAL RESULT CALLED TO, READ BACK BY AND VERIFIED WITH: PHARMD J LEGGE [id number] AT 1332 BY CM    Proteus species NOT DETECTED NOT DETECTED Final   Salmonella species NOT DETECTED NOT DETECTED Final   Serratia  marcescens NOT DETECTED NOT DETECTED Final   Haemophilus influenzae NOT DETECTED NOT DETECTED Final   Neisseria meningitidis NOT DETECTED NOT DETECTED Final   Pseudomonas aeruginosa NOT DETECTED NOT DETECTED Final   Stenotrophomonas maltophilia NOT DETECTED NOT DETECTED Final   Candida albicans NOT DETECTED NOT DETECTED Final   Candida auris NOT DETECTED NOT DETECTED Final   Candida glabrata NOT DETECTED NOT DETECTED Final   Candida krusei NOT DETECTED NOT DETECTED Final   Candida parapsilosis NOT DETECTED NOT DETECTED Final   Candida tropicalis NOT DETECTED NOT DETECTED Final   Cryptococcus neoformans/gattii NOT DETECTED NOT DETECTED Final   CTX-M ESBL NOT DETECTED NOT DETECTED Final   Carbapenem resistance IMP NOT DETECTED NOT DETECTED Final   Carbapenem resistance KPC NOT DETECTED NOT DETECTED Final   Carbapenem resistance NDM NOT DETECTED NOT DETECTED Final   Carbapenem resist OXA 48 LIKE NOT DETECTED NOT DETECTED Final   Carbapenem resistance VIM NOT DETECTED NOT DETECTED Final    Comment: Performed at [facility], [address] [place], [place] [address]     Radiology Studies: No results found.  Scheduled Meds:  amLODipine  10 mg Oral Daily   carvedilol  25 mg Oral BID WC   Chlorhexidine Gluconate Cloth  6 each Topical Daily   dronabinol  2.5 mg Oral QAC lunch   enoxaparin (LOVENOX) injection  100 mg Subcutaneous Q24H   feeding supplement  237 mL Oral BID BM   ferrous sulfate  325 mg Oral BID WC   potassium chloride  20 mEq Oral BID   sodium chloride flush  10-40 mL Intracatheter Q12H  Continuous Infusions:  cefTRIAXone (ROCEPHIN)  IV 2 g (07/14/23 1347)     LOS: 3 days   Time spent: 35 minutes  Carollee Herter, DO  [facility]  07/15/2023, 8:34 AM

## 2023-07-15 NOTE — TOC Progression Note (Signed)
Transition of Care [facility]) - Progression Note    Patient Details  Name: Aaron Roth MRN: [id number] Date of Birth: 1946/10/09  [facility] CM/SW Contact  Beckie Busing, RN Phone Number:[phone]  07/15/2023, 2:03 PM  Clinical Narrative:    [facility] acknowledges consult for SNF placement. Per nurse patient is confused and unable to answer questions about care, CM attempted to contact wife, there is no answer message has been left. There is no family at bedside.         Expected Discharge Plan and Services                                               Social Determinants of Health (SDOH) Interventions SDOH Screenings   Food Insecurity: Food Insecurity Present (07/14/2023)  Housing: Medium Risk (07/14/2023)  Transportation Needs: No Transportation Needs (07/14/2023)  Utilities: Not At Risk (07/14/2023)  Depression (PHQ2-9): Low Risk  (04/15/2023)  Tobacco Use: High Risk (07/14/2023)    Readmission Risk Interventions     No data to display

## 2023-07-15 NOTE — Telephone Encounter (Signed)
Called to check status of application. Informed by representative that they were still waiting for financial documents to be submitted. I asked representative to keep patient's case open as the patient was unavailable for the next little bit. After speaking with representative, I saw MD note from inpatient hospital note:   "I do not recommend a trial of Lonsurf unless he has significant improvement in his performance status over the next week. I recommend hospice care."  Oral Chemo will continue to monitor to see if patient's performance status improves enough to start Lonsurf.    Ardeen Fillers, CPhT Oncology Pharmacy Patient Advocate  [facility]  [phone] (phone) [phone] (fax) 07/15/2023 9:24 AM

## 2023-07-15 NOTE — Consult Note (Signed)
Palliative Care Consult Note                                  Date: 07/15/2023   Patient Name: Aaron Roth  DOB: 1946/01/09  MRN: [id number]  Age / Sex: 77 y.o., male  PCP: Aaron Frisk, MD Referring Physician: Carollee Herter, DO  Reason for Consultation: Establishing goals of care  HPI/Patient Profile: 77 y.o. male  with past medical history of metastatic colon cancer, pulmonary embolism, iron deficiency anemia, and GERD who presented to the ED on 07/12/2023 with weakness and fall.  He was found at home by his family on the bathroom floor.  CT head was negative for CVA and showed small right parietal scalp hematoma.  He was admitted with concern for UTI, which was later confirmed by culture growing Aaron Roth.  Blood culture also positive for Aaron Roth. Palliative Medicine was consulted for GOC.   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 denies    Subjective:   I have reviewed medical records including EPIC notes, labs and imaging.   I met with patient and wife at bedside to discuss diagnosis, prognosis, GOC, EOL wishes, disposition, and options.  I introduced Palliative Medicine as specialized medical care for people living with serious illness. It focuses on providing relief from the symptoms and stress of a serious illness.   We discussed patient's current illness and what it means in the larger context of his ongoing co-morbidities. Current clinical status was reviewed.  Created space and opportunity for patient and wife  to express thoughts and feelings regarding current medical situation. Values and goals of care were attempted to be elicited.  Questions and concerns addressed. Patient/family encouraged to call with questions or concerns.    Life Review: Patient and wife have been married for 57 years. They live in [place].  They have 1 son Aaron Roth.  GOC Discussion: I initially spoke with patient alone as there was no family at bedside.  His mental status is clear at times, but he still has intermittent confusion.  His responses are delayed and often tangential.   I returned to the room late this afternoon when his wife Aaron Roth arrived. Aaron Roth shares that she spoke with Dr. Truett Roth by phone just prior to her arrival.    We reviewed that patient has metastatic colon cancer, with evidence of disease progression on restaging CT scan in August. Patient initially desired to proceed with salvage chemo Aaron Roth), but was not able to start this due to insurance issues. Unfortunately, patient's functional status has been declining, even prior to his current admission with sepsis due to UTI. Wife verbalizes understanding that he is not a candidate for further chemotherapy at this time.  Patient and wife both speak to their strong faith in God and their belief in the power of prayer. They remain hopeful for healing, but express understanding it may occur in a spiritual sense rather than a physical one.  We discussed code status. Encouraged consideration of  DNR/DNI status understanding that prognosis would be poor in the event of cardiac arrest, as the cause of the arrest would likely be associated with terminal disease rather than a reversible acute cardio-pulmonary event. I explained that DNR/DNI is a protective measure to prevent trauma during a person's last moments of life.  Patient and wife both agree that DNR/DNI status is appropriate.  PT has recommended SNF/rehab, but wife reports plan is for patient to return  home. She wants to ensure he is able to ambulate prior to returning home.    Review of Systems  Unable to perform ROS   Objective:   Primary Diagnoses: Present on Admission:  Iron deficiency anemia due to chronic blood loss  Essential hypertension  Colon cancer, ascending [facility])   Physical Exam Vitals reviewed.  Constitutional:      General: He is not in acute distress.    Appearance: He is cachectic. He is ill-appearing.  Pulmonary:     Effort: Pulmonary effort is normal.  Neurological:     Mental Status: He is alert. He is confused.  Psychiatric:        Speech: Speech is tangential.     Vital Signs:  BP (!) 153/93 (BP Location: Right Arm)   Pulse 65   Temp 98 F (36.7 C) (Oral)   Resp 17   Ht 5\' 7"  (1.702 m)   Wt 68.9 kg   SpO2 98%   BMI 23.81 kg/m   Palliative Assessment/Data: PPS 40%     Assessment & Plan:   SUMMARY OF RECOMMENDATIONS   Code status changed to DNR with limited interventions Continue current supportive interventions We did not discuss hospice today, but I plan to follow-up with patient and wife again tomorrow.    Primary Decision Maker: Due to his intermittent confusion, patient needs support from his wife for any complex medical decisions  Prognosis:  < 6 months  Discharge Planning:  To Be Determined    Thank you for allowing [place] to participate in the care of Aaron Roth   Time Total: 92 minutes  Greater than 50%  of this time was spent counseling and coordinating care related to the above assessment and plan.  Signed by: Sherlean Foot, NP Palliative Medicine Team  Team Phone # [phone]  For individual providers, please see AMION

## 2023-07-16 ENCOUNTER — Inpatient Hospital Stay (HOSPITAL_COMMUNITY): Payer: Medicare Other

## 2023-07-16 DIAGNOSIS — Z66 Do not resuscitate: Secondary | ICD-10-CM

## 2023-07-16 MED ORDER — POTASSIUM CHLORIDE 2 MEQ/ML IV SOLN
INTRAVENOUS | Status: AC
  Filled 2023-07-16 (×2): qty 1000

## 2023-07-16 MED ORDER — ONDANSETRON HCL 4 MG/2ML IJ SOLN
4.0000 mg | Freq: Four times a day (QID) | INTRAMUSCULAR | Status: DC | PRN
  Administered 2023-07-16: 4 mg via INTRAVENOUS
  Filled 2023-07-16: qty 2

## 2023-07-16 MED ORDER — ONDANSETRON HCL 4 MG/2ML IJ SOLN: 4.0000 mg | Freq: Once | INTRAMUSCULAR | Status: DC

## 2023-07-16 MED ORDER — ORAL CARE MOUTH RINSE: 15.0000 mL | OROMUCOSAL | Status: DC | PRN

## 2023-07-16 NOTE — Progress Notes (Signed)
Physical Therapy Treatment Patient Details Name: Aaron Roth MRN: [id number] DOB: 1946-04-16 Today's Date: 07/16/2023   History of Present Illness 77 yo male presents to therapy following hospital admission secondary to fall and progressive weakness over the past 3-4 days. Pt was found to have elevated tempeture and BP. Pt was found to have UTI and started on ABX. Pt PMH includes but is not limited to: colon ca and currently on chemo, 06/23/2023 R LE DVT and PE, HTN, GERD, anemia, and HTN.    PT Comments   Pt admitted with above diagnosis.  Pt currently with functional limitations due to the deficits listed below (see PT Problem List). Pt in bed when PT arrived. Spouse present. Pt initally agreeable to therapy intervention and PT made pt aware of goal for ambulation today. Pt stated he would need a RW, PT set up for transfers and gait tasks with RW. Pt required increased time and extensive assist for semi reclined to almost sitting upright EOB for trunk and B LE, pt reported he was feeling dizzy and was not going to do it. PT provided ed and encouragement, pt declined further mobility with PT and pt left in bed all needs in place and spouse present. Patient will benefit from continued inpatient follow up therapy, <3 hours/day. Pt will benefit from acute skilled PT to increase their independence and safety with mobility to allow discharge.      If plan is discharge home, recommend the following: A lot of help with walking and/or transfers;A lot of help with bathing/dressing/bathroom;Assistance with cooking/housework;Assist for transportation;Help with stairs or ramp for entrance;Supervision due to cognitive status   Can travel by private vehicle        Equipment Recommendations  Rolling walker (2 wheels)    Recommendations for Other Services       Precautions / Restrictions Precautions Precautions: Fall Restrictions Weight Bearing Restrictions: No     Mobility  Bed Mobility Overal  bed mobility: Needs Assistance Bed Mobility: Supine to Sit, Sit to Supine     Supine to sit: Mod assist, HOB elevated, Used rails Sit to supine: Max assist   General bed mobility comments: pt required increasd time cues for encouragment, pt reported feeling dizzy and then reported he could not do it today, pt was unable to complete fully sitting upright EOB. PT provided encouragement and education on importance of increased OOB time allowing for adjustment for Bp. PT also made pt aware that MD was inquiring if pt was able to ambulate, wife reproted pt stated he felt like he could walk today. pt continued to decline further mobility with PT.    Transfers                   General transfer comment: NT pt declined    Ambulation/Gait               General Gait Details: NT due to pt declining   Stairs             Wheelchair Mobility     Tilt Bed    Modified Rankin (Stroke Patients Only)       Balance Overall balance assessment: Needs assistance, History of Falls Sitting-balance support: Feet supported Sitting balance-Leahy Scale: Fair     Standing balance support: Single extremity supported, During functional activity, Reliant on assistive device for balance Standing balance-Leahy Scale: Poor  Cognition Arousal: Alert Behavior During Therapy: Agitated, Impulsive Overall Cognitive Status: Impaired/Different from baseline Area of Impairment: Attention, Memory, Safety/judgement, Following commands                                        Exercises      General Comments General comments (skin integrity, edema, etc.): B LE and UE edema      Pertinent Vitals/Pain Pain Assessment Pain Assessment: No/denies pain    Home Living Family/patient expects to be discharged to:: Private residence Living Arrangements: Spouse/significant other Available Help at Discharge: Family Type of Home:  House Home Access: Stairs to enter Entrance Stairs-Rails: Right Entrance Stairs-Number of Steps: 2 (1 step to enter from the front of the home with posts to hold onto)   Home Layout: One level        Prior Function            PT Goals (current goals can now be found in the care plan section) Acute Rehab PT Goals PT Goal Formulation: Patient unable to participate in goal setting Progress towards PT goals: Not progressing toward goals - comment    Frequency    Min 1X/week      PT Plan      Co-evaluation              AM-PAC PT "6 Clicks" Mobility   Outcome Measure  Help needed turning from your back to your side while in a flat bed without using bedrails?: A Lot Help needed moving from lying on your back to sitting on the side of a flat bed without using bedrails?: A Lot Help needed moving to and from a bed to a chair (including a wheelchair)?: Total Help needed standing up from a chair using your arms (e.g., wheelchair or bedside chair)?: Total Help needed to walk in hospital room?: Total Help needed climbing 3-5 steps with a railing? : Total 6 Click Score: 8    End of Session   Activity Tolerance: Patient limited by fatigue;Treatment limited secondary to medical complications (Comment);Other (comment) (pt apparent self limiting behaviors) Patient left: in chair;with call bell/phone within reach;with family/visitor present Nurse Communication: Mobility status PT Visit Diagnosis: Unsteadiness on feet (R26.81);Other abnormalities of gait and mobility (R26.89);Muscle weakness (generalized) (M62.81);History of falling (Z91.81);Difficulty in walking, not elsewhere classified (R26.2);Pain     Time: 1350-1402 PT Time Calculation (min) (ACUTE ONLY): 12 min  Charges:    $Therapeutic Activity: 8-22 mins PT General Charges $$ ACUTE PT VISIT: 1 Visit                     Johnny Bridge, PT Acute Rehab   Jacqualyn Posey 07/16/2023, 3:12 PM

## 2023-07-16 NOTE — Progress Notes (Signed)
PROGRESS NOTE    Aaron Roth  DGU:[id number] DOB: 05-03-1946 DOA: 07/12/2023 PCP: Storm Frisk, MD  Subjective: Pt seen and examined. Met with pt and wife. Wife knows that pt refuses to take meds. She states he does this at home as well.  Pt failed to participate with PT. SNF is recommended.  Wife in agreement.  Pt made DNR/DNI by palliative care last night after they met with pt's wife.  Per oncology, wife understands that pt's cancer is incurable now and that chemo will not help him.   Hospital Course: HPI: Aaron Roth is a 77 y.o. 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   Wife and son provides hx. Wife was asleep and heard pt call for help.Found to all the floor in the bathroom laying on his back and trying to get up. Pt does not really recall the fall but denies loss of consciousness. Son had trouble getting him out of bed so decided to come to ED. Denies any fever or chills.  No nausea, vomiting but has intermittent diarrhea since being on chemotherapy.  Has decreased appetite and has been losing weight.     In the ED, he had temperature of 100 F, blood pressure 130/95 on room air.   CBC without leukocytosis, hemoglobin 9.3 with baseline of 7-8.  Lactate within normal limits.   CMP notable for hypokalemia of 2.9, calcium 7.1 with albumin less than 1.5, creatinine stable at 0.90.  Magnesium within normal limits.   Troponin mildly elevated 34, BNP of 114.   UA was positive with large leukocyte, positive nitrite many bacteria.  Chest x-ray negative.  Positive FOBT likely secondary to his colon cancer as his hemoglobin is stable.   He also had hypoglycemia down to 13 although ED physician questions the validity of the test as patient was mentating well.  He was given D10 and had a repeat glucose that came up to 170s.  Significant  Events: Admitted 07/12/2023 BG obtained for port-a-cath showed 135, despite fingerstick BG reading 26. 07-14-2023 pt started on Marinol for appetite stimulation  Significant Labs: Blood cx 07-12-2023 growing Klebsiella Urine cx 07-12-2023 growing Klebsiella pan-sensitive except for ampicillin  Significant Imaging Studies: 07-12-2023 CT head negative for CVA, shows small right parietal scalp hematoma  Antibiotic Therapy: Rocephin started 07-12-2023  Procedures:   Consultants: Oncology - [place]    Assessment and Plan: * UTI due to Klebsiella species Urine cx growing Klebsiella pneumoniae.  Sensitive to Rocephin. IV rocephin started 07-12-2023. Stopped on 07-15-2023.  Started on [facility] 07-15-2023  Septicemia due to Klebsiella pneumoniae [facility]) Started initially on IV rocephin. Blood MIC similar to urine cx. IV rocephin stopped on 07-15-2023.  discussed with pharmacy and decision made to start Duricef 1000 mg bid.  ulture KLEBSIELLA PNEUMONIAE Abnormal   Report Status 07/15/2023 FINAL  Organism ID, Bacteria KLEBSIELLA PNEUMONIAE  Resulting Agency [facility]     Susceptibility    Klebsiella pneumoniae    MIC    AMPICILLIN RESISTANT Resistant    AMPICILLIN/SULBACTAM 4 SENSITIVE Sensitive    CEFEPIME <=0.12 SENS... Sensitive    CEFTAZIDIME <=1 SENSITIVE Sensitive    CEFTRIAXONE <=0.25 SENS... Sensitive    CIPROFLOXACIN <=0.25 SENS... Sensitive    GENTAMICIN <=1 SENSITIVE Sensitive    IMIPENEM <=0.25 SENS... Sensitive    PIP/TAZO <=4 SENSITIVE Sensitive    TRIMETH/SULFA <=20 SENSIT... Sensitive  Weakness Secondary to UTI and dehydration Continue with PT  History of DVT (deep vein thrombosis) Continue with therapeutic dose lovenox.  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 BG from port-a-cath was normal despite CBG from finger saying BG was low. Stopped  accuchecks.  Hypokalemia Resolved after IV and oral potassium replacement.  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 oncology has recommended against starting salvage therapy with Lonsurf. Pt performance status is poor. Pt with overall very poor prognosis. Oncology has discussed prognosis with pt's wife. She understands that pt is not candidate for chemo any longer.  Wife had asked about medications to help his appetite. trial of marinol started 07-14-2023.  Had some vomiting today. Will check KUB to r/o SBO.  Essential hypertension Continue amlodipine and Coreg  Iron deficiency anemia due to chronic blood loss - Stable.  Hemoglobin of 8.6 with baseline around 7-8  DNR (do not resuscitate)/DNI(Do Not Intubate) GOC discussion held by palliative care yesterday. I really appreciate their involvement.  Pt's code status changed to DNR/DNI.   DVT prophylaxis:   Lovenox   Code Status: Limited: Do not attempt resuscitation (DNR) -DNR-LIMITED -Do Not Intubate/DNI  Family Communication: discussed with pt and wife at bedside Disposition Plan: wife cannot care for patient home as he is non-longer ambulatory. Will need SNF placement. CM aware Reason for continuing need for hospitalization: medically stable for DC to SNF. CM working on this.  Objective: Vitals:   07/15/23 1959 07/15/23 2014 07/16/23 0528 07/16/23 1406  BP: 136/77  (!) 144/100 (!) 154/102  Pulse: 65 67 67 79  Resp: 18  18 15   Temp: 97.6 F (36.4 C)  98 F (36.7 C) (!) 97.5 F (36.4 C)  TempSrc: Oral  Oral Oral  SpO2:  97% 100% 100%  Weight:      Height:        Intake/Output Summary (Last 24 hours) at 07/16/2023 1528 Last data filed at 07/16/2023 0810 Gross per 24 hour  Intake 1055 ml  Output 110 ml  Net 945 ml   Filed Weights   07/12/23 1722  Weight: 68.9 kg    Examination:  Physical Exam Vitals and nursing note reviewed.   Constitutional:      General: He is not in acute distress.    Appearance: He is not toxic-appearing or diaphoretic.     Comments: Thin, frail, chronically ill appearing AAM.  HENT:     Head: Normocephalic and atraumatic.     Nose: Nose normal.  Eyes:     General: No scleral icterus. Cardiovascular:     Rate and Rhythm: Normal rate and regular rhythm.  Pulmonary:     Effort: Pulmonary effort is normal.     Breath sounds: Normal breath sounds.  Abdominal:     General: Bowel sounds are normal.     Tenderness: There is no abdominal tenderness.     Comments: Multiple soft tissue masses in his abd that are firm to palpation  Musculoskeletal:     Right lower leg: No edema.     Left lower leg: No edema.  Skin:    General: Skin is warm and dry.     Capillary Refill: Capillary refill takes less than 2 seconds.  Neurological:     Mental Status: He is alert and oriented to person, place, and time.    Data Reviewed: I have personally reviewed following  labs and imaging studies  CBC: Recent Labs  Lab 07/12/23 1815 07/13/23 0443 07/14/23 0459  WBC 6.0 6.2 5.4  NEUTROABS 4.3  --  3.4  HGB 9.3* 9.1* 8.6*  HCT 31.3* 30.0* 28.4*  MCV 79.6* 79.2* 79.8*  PLT 212 200 164   Basic Metabolic Panel: Recent Labs  Lab 07/12/23 1815 07/13/23 0443 07/14/23 0459  NA 137 137 136  K 2.9* 3.6 3.9  CL 102 104 104  CO2 27 27 27   GLUCOSE 72 85 105*  BUN 21 19 19   CREATININE 0.90 0.69 0.86  CALCIUM 7.1* 7.1* 7.1*  MG 2.1  --   --    GFR: Estimated Creatinine Clearance: 65.1 mL/min (by C-G formula based on SCr of 0.86 mg/dL). Liver Function Tests: Recent Labs  Lab 07/12/23 1815  AST 17  ALT 13  ALKPHOS 97  BILITOT 0.7  PROT 4.7*  ALBUMIN <1.5*   Coagulation Profile: Recent Labs  Lab 07/12/23 1815  INR 1.2   CBG: Recent Labs  Lab 07/14/23 0621 07/14/23 0734 07/14/23 1159 07/14/23 1205 07/14/23 1218  GLUCAP 62* 80 24* 27* 135*   Sepsis Labs: Recent Labs  Lab  07/12/23 1844 07/12/23 1945  LATICACIDVEN 1.0 1.2    Recent Results (from the past 240 hour(s))  Urine Culture     Status: Abnormal   Collection Time: 07/12/23  6:15 PM   Specimen: Urine, Random  Result Value Ref Range Status   Specimen Description   Final    URINE, RANDOM Performed at [facility], [address], [place], [place] [address]    Special Requests   Final    NONE Reflexed from [id number] Performed at [facility], [address] [place], [place] [address]    Culture >=100,000 COLONIES/mL KLEBSIELLA PNEUMONIAE (A)  Final   Report Status 07/14/2023 FINAL  Final   Organism ID, Bacteria KLEBSIELLA PNEUMONIAE (A)  Final      Susceptibility   Klebsiella pneumoniae - MIC*    AMPICILLIN RESISTANT Resistant     CEFAZOLIN <=4 SENSITIVE Sensitive     CEFEPIME <=0.12 SENSITIVE Sensitive     CEFTRIAXONE <=0.25 SENSITIVE Sensitive     CIPROFLOXACIN <=0.25 SENSITIVE Sensitive     GENTAMICIN <=1 SENSITIVE Sensitive     IMIPENEM <=0.25 SENSITIVE Sensitive     NITROFURANTOIN 32 SENSITIVE Sensitive     TRIMETH/SULFA <=20 SENSITIVE Sensitive     AMPICILLIN/SULBACTAM 4 SENSITIVE Sensitive     PIP/TAZO <=4 SENSITIVE Sensitive     * >=100,000 COLONIES/mL KLEBSIELLA PNEUMONIAE  Blood Culture (routine x 2)     Status: Abnormal   Collection Time: 07/12/23  8:25 PM   Specimen: BLOOD RIGHT HAND  Result Value Ref Range Status   Specimen Description   Final    BLOOD RIGHT HAND Performed at [facility], [address], [place], [place] [address]    Special Requests   Final    BOTTLES DRAWN AEROBIC AND ANAEROBIC Blood Culture adequate volume Performed at [facility], [address] [place], [place] [address]    Culture  Setup Time   Final    GRAM NEGATIVE RODS AEROBIC BOTTLE ONLY CRITICAL RESULT CALLED TO, READ BACK BY AND VERIFIED WITH: PHARMD J LEGGE [id number] AT 1332 BY CM Performed at [facility]  Lab, [address] [place], [place] [address]    Culture KLEBSIELLA PNEUMONIAE (A)  Final   Report Status 07/15/2023 FINAL  Final   Organism ID,  Bacteria KLEBSIELLA PNEUMONIAE  Final      Susceptibility   Klebsiella pneumoniae - MIC*    AMPICILLIN RESISTANT Resistant     CEFEPIME <=0.12 SENSITIVE Sensitive     CEFTAZIDIME <=1 SENSITIVE Sensitive     CEFTRIAXONE <=0.25 SENSITIVE Sensitive     CIPROFLOXACIN <=0.25 SENSITIVE Sensitive     GENTAMICIN <=1 SENSITIVE Sensitive     IMIPENEM <=0.25 SENSITIVE Sensitive     TRIMETH/SULFA <=20 SENSITIVE Sensitive     AMPICILLIN/SULBACTAM 4 SENSITIVE Sensitive     PIP/TAZO <=4 SENSITIVE Sensitive     * KLEBSIELLA PNEUMONIAE  Blood Culture ID Panel (Reflexed)     Status: Abnormal   Collection Time: 07/12/23  8:25 PM  Result Value Ref Range Status   Enterococcus faecalis NOT DETECTED NOT DETECTED Final   Enterococcus Faecium NOT DETECTED NOT DETECTED Final   Listeria monocytogenes NOT DETECTED NOT DETECTED Final   Staphylococcus species NOT DETECTED NOT DETECTED Final   Staphylococcus aureus (BCID) NOT DETECTED NOT DETECTED Final   Staphylococcus epidermidis NOT DETECTED NOT DETECTED Final   Staphylococcus lugdunensis NOT DETECTED NOT DETECTED Final   Streptococcus species NOT DETECTED NOT DETECTED Final   Streptococcus agalactiae NOT DETECTED NOT DETECTED Final   Streptococcus pneumoniae NOT DETECTED NOT DETECTED Final   Streptococcus pyogenes NOT DETECTED NOT DETECTED Final   A.calcoaceticus-baumannii NOT DETECTED NOT DETECTED Final   Bacteroides fragilis NOT DETECTED NOT DETECTED Final   Enterobacterales DETECTED (A) NOT DETECTED Final    Comment: Enterobacterales represent a large order of gram negative bacteria, not a single organism. CRITICAL RESULT CALLED TO, READ BACK BY AND VERIFIED WITH: PHARMD J LEGGE [id number] AT 1332 BY CM    Enterobacter cloacae complex NOT DETECTED NOT DETECTED Final   Escherichia coli NOT DETECTED NOT DETECTED  Final   Klebsiella aerogenes NOT DETECTED NOT DETECTED Final   Klebsiella oxytoca NOT DETECTED NOT DETECTED Final   Klebsiella pneumoniae DETECTED (A) NOT DETECTED Final    Comment: CRITICAL RESULT CALLED TO, READ BACK BY AND VERIFIED WITH: PHARMD J LEGGE [id number] AT 1332 BY CM    Proteus species NOT DETECTED NOT DETECTED Final   Salmonella species NOT DETECTED NOT DETECTED Final   Serratia marcescens NOT DETECTED NOT DETECTED Final   Haemophilus influenzae NOT DETECTED NOT DETECTED Final   Neisseria meningitidis NOT DETECTED NOT DETECTED Final   Pseudomonas aeruginosa NOT DETECTED NOT DETECTED Final   Stenotrophomonas maltophilia NOT DETECTED NOT DETECTED Final   Candida albicans NOT DETECTED NOT DETECTED Final   Candida auris NOT DETECTED NOT DETECTED Final   Candida glabrata NOT DETECTED NOT DETECTED Final   Candida krusei NOT DETECTED NOT DETECTED Final   Candida parapsilosis NOT DETECTED NOT DETECTED Final   Candida tropicalis NOT DETECTED NOT DETECTED Final   Cryptococcus neoformans/gattii NOT DETECTED NOT DETECTED Final   CTX-M ESBL NOT DETECTED NOT DETECTED Final   Carbapenem resistance IMP NOT DETECTED NOT DETECTED Final   Carbapenem resistance KPC NOT DETECTED NOT DETECTED Final   Carbapenem resistance NDM NOT DETECTED NOT DETECTED Final   Carbapenem resist OXA 48 LIKE NOT DETECTED NOT DETECTED Final   Carbapenem resistance VIM NOT DETECTED NOT DETECTED Final    Comment: Performed at [facility], [address], [place], [place] [address]     Radiology Studies: No results found.  Scheduled Meds:  amLODipine  10 mg Oral Daily   carvedilol  25 mg Oral BID WC   cefadroxil  1,000 mg Oral BID  Chlorhexidine Gluconate Cloth  6 each Topical Daily   dronabinol  2.5 mg Oral QAC lunch   enoxaparin (LOVENOX) injection  100 mg Subcutaneous Q24H   feeding supplement  237 mL Oral BID BM   ferrous sulfate  325 mg Oral BID WC   ondansetron (ZOFRAN) IV  4 mg Intravenous Once    potassium chloride  20 mEq Oral BID   sodium chloride flush  10-40 mL Intracatheter Q12H   Continuous Infusions:  lactated ringers 1,000 mL with potassium chloride 20 mEq infusion       LOS: 4 days   Time spent: 40 minutes  Carollee Herter, DO  [facility]  07/16/2023, 3:28 PM

## 2023-07-16 NOTE — Assessment & Plan Note (Signed)
GOC discussion held by palliative care yesterday. I really appreciate their involvement.  Pt's code status changed to DNR/DNI.

## 2023-07-16 NOTE — Progress Notes (Signed)
IP PROGRESS NOTE  Subjective:   Aaron Roth reports having some abdominal pain.  No bleeding.  No other complaint.  His wife is not present this morning. Objective: Vital signs in last 24 hours: Blood pressure (!) 144/100, pulse 67, temperature 98 F (36.7 C), temperature source Oral, resp. rate 18, height 5\' 7"  (1.702 m), weight 152 lb (68.9 kg), SpO2 100%.  Intake/Output from previous day: 09/12 0701 - 09/13 0700 In: 1205 [P.O.:250; I.V.:955] Out: 110 [Urine:110]  Physical Exam:  HEENT: No thrush  Abdomen: Firm mass occupying the mid abdomen, no hepatomegaly Extremities: Pitting edema at the legs bilaterally Neurologic: Alert, follows commands, oriented to year and place, not oriented to specific diagnosis   Portacath/PICC-without erythema  Lab Results: Recent Labs    07/14/23 0459  WBC 5.4  HGB 8.6*  HCT 28.4*  PLT 164    BMET Recent Labs    07/14/23 0459  NA 136  K 3.9  CL 104  CO2 27  GLUCOSE 105*  BUN 19  CREATININE 0.86  CALCIUM 7.1*    Lab Results  Component Value Date   CEA1 2.0 08/04/2020   CEA 8.65 (H) 06/28/2023    Studies/Results: No results found.  Medications: I have reviewed the patient's current medications.  Assessment/Plan:  Colon cancer  CT abdomen/pelvis 08/02/2020-small amount of free fluid in the pelvis, postsurgical change involving the left colon, trace right pleural fluid.   Colonoscopy 08/04/2020-cecal mass as well as a 3 mm polyp in the ascending colon, 9 mm polyp in the ascending colon, 3 mm polyp in the transverse colon and 4 small polyps in the transverse colon.  Biopsy of the cecal mass showed adenocarcinoma.  Pathology on the polyps showed tubular adenomas with no high-grade dysplasia or malignancy identified. CT chest 08/04/2020-no evidence of metastatic disease CEA 08/04/2020-2.0 Laparoscopic right colectomy 10/17/2020 by Dr. Nigel Mormon showed adenocarcinoma with mucinous features, moderately differentiated, 6.7  cm; no carcinoma identified and 23 lymph nodes; margins uninvolved; no macroscopic tumor perforation identified; tumor invaded through muscularis propria into pericolorectal tissue; no lymphovascular invasion and no perineural invasion, pT3, pN0, mismatch repair protein IHC normal, MSI stable.   06/18/2022-CT abdomen/pelvis-homogenous soft tissue mass in the anterior abdomen deep to the umbilicus, 5 mm left lower lobe nodule, new from October 2021 11/04/2022 biopsy of abdominal mass-metastatic mucinous adenocarcinoma, mismatch repair protein IHC normal, foundation 1 pending CTs 11/18/2022-large mass central lower abdomen is increased in size.  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  No evidence of obstruction.  Irregular lesions right lobe of the liver likely new, compatible with metastatic disease.  Increase size of a solid left lower lobe pulmonary nodule likely due to metastatic disease.  Solid pulmonary nodule left upper lobe increased in size likely due to indolent primary lung malignancy. Foundation 1 testing (date of collection 10/17/2020, specimen received 11/24/2022)-microsatellite status is equivocal; tumor mutation burden 6; K-ras wild-type; NRAS G12S; BRAF G466A Cycle 1 FOLFIRI 12/07/2022 Chemotherapy held 12/21/2022 due to neutropenia Chemotherapy held 12/28/2022 per patient due to upcoming dental extractions Cycle 2 FOLFIRI 01/11/2023, irinotecan dose reduced secondary to diarrhea neutropenia, Udenyca Cycle 3 FOLFIRI 01/25/2023 Cycle 4 FOLFIRI 02/08/2023 Cycle 5 FOLFIRI 02/22/2023 CTs 02/24/2023-progression of liver metastases; similar to minimal enlargement of dominant anterior abdominopelvic wall mass with 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, new trace perihepatic ascites Cycle 1 FOLFOX 03/15/2023 Cycle 2 FOLFOX 04/06/2023 Cycle 3 FOLFOX 04/19/2023 Cycle  4 FOLFOX 05/05/2023, oxaliplatin diluted  in a larger volume and infusion time extended, premedications adjusted due to pruritus during cycle 3 Cycle 5 FOLFOX 06/02/2023 CTs 06/23/2023-pulmonary emboli, stable pulmonary nodules, new trace bilateral pleural effusions, mild enlargement of hepatic metastases and the anterior abdominal wall mass, fistulous communication of the mass and bowel Cycle 1 Lonsurf scheduled to start 07/14/2023 Iron deficiency anemia October 2021  08/02/2020 hemoglobin 3.8, MCV 56, ferritin 3, stool positive for occult blood 10/19/2020 hemoglobin 7.3, MCV 74 11/04/2022 hemoglobin 4.5 11/05/2022 ferritin 3 Ferrous sulfate 325 mg twice daily recommended 11/06/2022 2 units packed red blood cells 11/17/2022 stool cards positive for blood 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 He completed cycle 1 CAPOX beginning 11/13/2011; cycle 7 beginning 03/23/2012. Cycle 8 was completed beginning on 04/13/2012 without oxaliplatin. Restaging CT scans 06/06/2012 without evidence of recurrent disease.   Family history-brother with colon cancer Multiple polyps on colonoscopy 08/04/2020  Port-A-Cath placement 12/04/2022, Interventional Radiology 06/23/2023-right iliac/femoral DVT and pulmonary emboli Lovenox starting 06/23/2023 8.  Admission 07/12/2023 with urosepsis 9.  Altered mental status secondary to delirium from sepsis  Aaron Roth has metastatic colon cancer.  There was evidence of disease progression on restaging CTs 06/23/2023.  He was found to have pulmonary emboli and is being treated with Lovenox.  Aaron Roth is now admitted with urosepsis.  He does not have evidence of ongoing sepsis.  Aaron Roth appears to have a very poor performance status and remains confused.  I do not feel he is a candidate for further chemotherapy.  I recommend hospice care.  Aaron Roth and his wife met with the palliative care team yesterday.  It is  unclear whether his wife will be able to care for him in the home.  We discussed the potential need for placement with hospice.  Aaron Roth appears agreeable to hospice, but wants some time to think about the disposition plan.    Recommendations: Antibiotics per the medical service Continue Lovenox anticoagulation Continue iron Referred to [facility] PT evaluation to assess his ability to ambulate Home with hospice versus SNF placement Please call Oncology as needed, I will check on him 07/19/2023 if he remains in the hospital and outpatient follow-up will be scheduled.     LOS: 4 days   Thornton Papas, MD   07/16/2023, 1:40 PM

## 2023-07-16 NOTE — Plan of Care (Signed)
Problem: Health Behavior/Discharge Planning: Goal: Ability to manage health-related needs will improve Outcome: Progressing

## 2023-07-16 NOTE — TOC Progression Note (Addendum)
Transition of Care [facility]) - Progression Note    Patient Details  Name: Aaron Roth MRN: [id number] Date of Birth: [birth year]  [facility] CM/SW Contact  Beckie Busing, RN Phone Number:[phone]  07/16/2023, 12:34 PM  Clinical Narrative:    CM spoke with wife Aaron Roth who confirms that it is the patients request to return home versus SNF. Wife states that she was told by MD that PT would come to reassess patient to determine is patient is able to walk. Wife states that she can take him home if he is able to walk. CM and wife agree to wait for updated PT notes before proceeding with disposition planning.    1551 Patient unable to ambulate with PT. Wife is now agreeable to SNF. TOC to initiate SNF workup.        Expected Discharge Plan and Services                                               Social Determinants of Health (SDOH) Interventions SDOH Screenings   Food Insecurity: Food Insecurity Present (07/14/2023)  Housing: Medium Risk (07/14/2023)  Transportation Needs: No Transportation Needs (07/14/2023)  Utilities: Not At Risk (07/14/2023)  Depression (PHQ2-9): Low Risk  (04/15/2023)  Tobacco Use: High Risk (07/14/2023)    Readmission Risk Interventions     No data to display

## 2023-07-16 NOTE — Plan of Care (Signed)
Patient refused all medications this shift, MD Imogene Burn notified. Wife called to assist with medication administration to try to encourage patient to take and patient refused, held medications and water in his mouth, proceeded to spit them out after multiple attempts. RN tried to crush medications and put them in apple sauce and patient refused, vomited & IV antiemetic administered.  Problem: Education: Goal: Knowledge of General Education information will improve Description: Including pain rating scale, medication(s)/side effects and non-pharmacologic comfort measures Outcome: Not Progressing   Problem: Health Behavior/Discharge Planning: Goal: Ability to manage health-related needs will improve Outcome: Not Progressing   Problem: Clinical Measurements: Goal: Ability to maintain clinical measurements within normal limits will improve Outcome: Not Progressing Goal: Will remain free from infection Outcome: Not Progressing Goal: Diagnostic test results will improve Outcome: Not Progressing Goal: Respiratory complications will improve Outcome: Not Progressing   Problem: Activity: Goal: Risk for activity intolerance will decrease Outcome: Not Progressing   Problem: Nutrition: Goal: Adequate nutrition will be maintained Outcome: Not Progressing   Problem: Coping: Goal: Level of anxiety will decrease Outcome: Not Progressing   Problem: Elimination: Goal: Will not experience complications related to bowel motility Outcome: Not Progressing Goal: Will not experience complications related to urinary retention Outcome: Not Progressing   Problem: Pain Managment: Goal: General experience of comfort will improve Outcome: Not Progressing   Problem: Safety: Goal: Ability to remain free from injury will improve Outcome: Not Progressing   Problem: Skin Integrity: Goal: Risk for impaired skin integrity will decrease Outcome: Not Progressing

## 2023-07-16 NOTE — Progress Notes (Signed)
Patient refused lovenox this evening

## 2023-07-16 NOTE — Plan of Care (Signed)
Problem: Education: Goal: Knowledge of General Education information will improve Description: Including pain rating scale, medication(s)/side effects and non-pharmacologic comfort measures Outcome: Not Progressing   Problem: Health Behavior/Discharge Planning: Goal: Ability to manage health-related needs will improve Outcome: Not Progressing   Problem: Clinical Measurements: Goal: Ability to maintain clinical measurements within normal limits will improve Outcome: Not Progressing Goal: Will remain free from infection Outcome: Not Progressing Goal: Diagnostic test results will improve Outcome: Not Progressing Goal: Respiratory complications will improve Outcome: Not Progressing   Problem: Activity: Goal: Risk for activity intolerance will decrease Outcome: Not Progressing   Problem: Nutrition: Goal: Adequate nutrition will be maintained Outcome: Not Progressing   Problem: Coping: Goal: Level of anxiety will decrease Outcome: Not Progressing   Problem: Elimination: Goal: Will not experience complications related to bowel motility Outcome: Not Progressing Goal: Will not experience complications related to urinary retention Outcome: Not Progressing   Problem: Pain Managment: Goal: General experience of comfort will improve Outcome: Not Progressing   Problem: Safety: Goal: Ability to remain free from injury will improve Outcome: Not Progressing   Problem: Skin Integrity: Goal: Risk for impaired skin integrity will decrease Outcome: Not Progressing

## 2023-07-17 LAB — COMPREHENSIVE METABOLIC PANEL
ALT: 13 U/L (ref 0–44)
AST: 16 U/L (ref 15–41)
Albumin: 1.6 g/dL — ABNORMAL LOW (ref 3.5–5.0)
Alkaline Phosphatase: 86 U/L (ref 38–126)
Anion gap: 7 (ref 5–15)
BUN: 13 mg/dL (ref 8–23)
CO2: 25 mmol/L (ref 22–32)
Calcium: 7.5 mg/dL — ABNORMAL LOW (ref 8.9–10.3)
Chloride: 103 mmol/L (ref 98–111)
Creatinine, Ser: 0.75 mg/dL (ref 0.61–1.24)
GFR, Estimated: >60 mL/min (ref >60)
Glucose, Bld: 71 mg/dL (ref 70–99)
Potassium: 4.4 mmol/L (ref 3.5–5.1)
Sodium: 135 mmol/L (ref 135–145)
Total Bilirubin: 0.4 mg/dL (ref 0.3–1.2)
Total Protein: 5 g/dL — ABNORMAL LOW (ref 6.5–8.1)

## 2023-07-17 LAB — CBC
HCT: 30.8 % — ABNORMAL LOW (ref 39.0–52.0)
Hemoglobin: 9.2 g/dL — ABNORMAL LOW (ref 13.0–17.0)
MCH: 24.3 pg — ABNORMAL LOW (ref 26.0–34.0)
MCHC: 29.9 g/dL — ABNORMAL LOW (ref 30.0–36.0)
MCV: 81.5 fL (ref 80.0–100.0)
Platelets: 196 10*3/uL (ref 150–400)
RBC: 3.78 MIL/uL — ABNORMAL LOW (ref 4.22–5.81)
WBC: 5.3 10*3/uL (ref 4.0–10.5)
nRBC: 0 % (ref 0.0–0.2)

## 2023-07-17 LAB — MAGNESIUM: Magnesium: 2.1 mg/dL (ref 1.7–2.4)

## 2023-07-17 MED ORDER — KCL-LACTATED RINGERS 20 MEQ/L IV SOLN
INTRAVENOUS | Status: DC
  Filled 2023-07-17: qty 1000

## 2023-07-17 MED ORDER — CEFAZOLIN SODIUM-DEXTROSE 2-4 GM/100ML-% IV SOLN
2.0000 g | Freq: Three times a day (TID) | INTRAVENOUS | Status: DC
  Administered 2023-07-17 – 2023-07-20 (×9): 2 g via INTRAVENOUS
  Filled 2023-07-17 (×8): qty 100

## 2023-07-17 MED ORDER — SODIUM CHLORIDE 0.9 % IV SOLN
2.0000 g | Freq: Every day | INTRAVENOUS | Status: DC
  Administered 2023-07-17: 2 g via INTRAVENOUS
  Filled 2023-07-17: qty 20

## 2023-07-17 MED ORDER — POTASSIUM CHLORIDE 2 MEQ/ML IV SOLN
INTRAVENOUS | Status: AC
  Filled 2023-07-17 (×2): qty 1000

## 2023-07-17 NOTE — Progress Notes (Signed)
PHARMACY NOTE -  Lovenox  Pharmacy has been assisting with dosing of Lovenox for Hx VTE. Dosage remains stable at 100 mg (1.5 mg/kg) SQ daily, and further renal adjustments per institutional Pharmacy antibiotic protocol Although pt on 90 mg daily at home, using most recent weight for dosing inpatient - will f/u discharge weight to determine if home dose also needs to be adjusted.  Pharmacy will sign off, following peripherally for culture results, dose adjustments, and length of therapy. Please reconsult if a change in clinical status warrants re-evaluation of dosage.  Bernadene Person, PharmD, BCPS [phone] 07/17/2023, 12:32 PM

## 2023-07-17 NOTE — Progress Notes (Addendum)
PROGRESS NOTE    [name]  HQI:[id number] DOB: 12/03/1945 DOA: 07/12/2023 PCP: Storm Frisk, MD  Subjective: Pt seen and examined. Met with pt and wife. Pt continues to refuse medications. Explained today to wife that pt is dying from his cancer and nothing can be done about it. She seems to finally understand that pt's cancer is incurable.  Yet she asks if he is going to eat better. Explained to her that cancer is filling pt's entire abd cavity and likely pressing against his stomach so that he is not hungry.  She still wants him to go to [facility] for therapy. Explained to her that CM will try and find a Snf for therapy but likely pt is never going to walk again.  KUB negative yesterday for SBO.   Hospital Course: HPI: Aaron Roth is a 77 y.o. 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   Wife and son provides hx. Wife was asleep and heard pt call for help.Found to all the floor in the bathroom laying on his back and trying to get up. Pt does not really recall the fall but denies loss of consciousness. Son had trouble getting him out of bed so decided to come to ED. Denies any fever or chills.  No nausea, vomiting but has intermittent diarrhea since being on chemotherapy.  Has decreased appetite and has been losing weight.     In the ED, he had temperature of 100 F, blood pressure 130/95 on room air.   CBC without leukocytosis, hemoglobin 9.3 with baseline of 7-8.  Lactate within normal limits.   CMP notable for hypokalemia of 2.9, calcium 7.1 with albumin less than 1.5, creatinine stable at 0.90.  Magnesium within normal limits.   Troponin mildly elevated 34, BNP of 114.   UA was positive with large leukocyte, positive nitrite many bacteria.  Chest x-ray negative.  Positive FOBT likely secondary to his colon cancer as his hemoglobin is stable.    He also had hypoglycemia down to 13 although ED physician questions the validity of the test as patient was mentating well.  He was given D10 and had a repeat glucose that came up to 170s.  Significant Events: Admitted 07/12/2023 BG obtained for port-a-cath showed 135, despite fingerstick BG reading 26. 07-14-2023 pt started on Marinol for appetite stimulation Pt made DNR/DNI by palliative care on 07-15-2023  Significant Labs: Blood cx 07-12-2023 growing Klebsiella Urine cx 07-12-2023 growing Klebsiella pan-sensitive except for ampicillin  Significant Imaging Studies: 07-12-2023 CT head negative for CVA, shows small right parietal scalp hematoma  Antibiotic Therapy: Anti-infectives (From admission, onward)    Start     Dose/Rate Route Frequency Ordered Stop   07/17/23 0815  cefTRIAXone (ROCEPHIN) 2 g in sodium chloride 0.9 % 100 mL IVPB        2 g 200 mL/hr over 30 Minutes Intravenous Daily 07/17/23 0756     07/15/23 1000  cefadroxil (DURICEF) capsule 1,000 mg  Status:  Discontinued        1,000 mg Oral 2 times daily 07/15/23 0839 07/17/23 0756   07/13/23 1400  cefTRIAXone (ROCEPHIN) 2 g in sodium chloride 0.9 % 100 mL IVPB  Status:  Discontinued        2 g 200 mL/hr over 30 Minutes Intravenous Every 24 hours 07/13/23 1349 07/15/23 0839   07/12/23 2300  cefTRIAXone (ROCEPHIN) 1 g in sodium  chloride 0.9 % 100 mL IVPB  Status:  Discontinued        1 g 200 mL/hr over 30 Minutes Intravenous Every 24 hours 07/12/23 2234 07/13/23 1349       Procedures:   Consultants: Oncology - [place]    Assessment and Plan: * UTI due to Klebsiella species Urine cx growing Klebsiella pneumoniae.  Sensitive to Rocephin. IV rocephin started 07-12-2023. Stopped on 07-15-2023.  Started on [facility] 07-15-2023  Due to N/V and pt refusing to take po meds. IV Ancef started 07-17-2023 by pharmacy recommendations.  Septicemia due to Klebsiella pneumoniae (HCC) Started initially on IV rocephin. Blood MIC similar  to urine cx. IV rocephin stopped on 07-15-2023.  discussed with pharmacy and decision made to start Duricef 1000 mg bid.  Due to pt refusal to take PO meds. IV Ancef started 07-17-2023.  ulture KLEBSIELLA PNEUMONIAE Abnormal   Report Status 07/15/2023 FINAL  Organism ID, Bacteria KLEBSIELLA PNEUMONIAE  Resulting Agency [facility]     Susceptibility    Klebsiella pneumoniae    MIC    AMPICILLIN RESISTANT Resistant    AMPICILLIN/SULBACTAM 4 SENSITIVE Sensitive    CEFEPIME <=0.12 SENS... Sensitive    CEFTAZIDIME <=1 SENSITIVE Sensitive    CEFTRIAXONE <=0.25 SENS... Sensitive    CIPROFLOXACIN <=0.25 SENS... Sensitive    GENTAMICIN <=1 SENSITIVE Sensitive    IMIPENEM <=0.25 SENS... Sensitive    PIP/TAZO <=4 SENSITIVE Sensitive    TRIMETH/SULFA <=20 SENSIT... Sensitive               Weakness Secondary to UTI and dehydration Continue with PT  History of DVT (deep vein thrombosis) Continue with therapeutic dose lovenox.  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 BG from port-a-cath was normal despite CBG from finger saying BG was low. Stopped accuchecks.  Hypokalemia Resolved after IV and oral potassium replacement.  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 oncology has recommended against starting salvage therapy with Lonsurf. Pt performance status is poor. Pt with overall very poor prognosis. Oncology has discussed prognosis with pt's wife. She understands that pt is not candidate for chemo any longer. Today(07-17-2023) I explained to wife that pt is dying from his progressive cancer. Nothing can stop that. She seems to understand it but then asks if his appetite will improve. I explained to her that pt's appetite will likely not improve as his cancer is progressive.  Essential  hypertension Continue amlodipine and Coreg  Iron deficiency anemia due to chronic blood loss - Stable.  Hemoglobin of 8.6 with baseline around 7-8  DNR (do not resuscitate)/DNI(Do Not Intubate) GOC discussion held by palliative care 07-15-2023. I really appreciate their involvement.  Pt's code status changed to DNR/DNI.       DVT prophylaxis:   Lovenox   Code Status: Limited: Do not attempt resuscitation (DNR) -DNR-LIMITED -Do Not Intubate/DNI  Family Communication: discussed with pt and wife Penelope at bedside Disposition Plan: SNF Reason for continuing need for hospitalization: awaiting SNF bed. Stable for DC.  Objective: Vitals:   07/16/23 0528 07/16/23 1406 07/16/23 2050 07/17/23 0612  BP: (!) 144/100 (!) 154/102 (!) 152/112 (!) 150/90  Pulse: 67 79  67  Resp: 18 15 16 16   Temp: 98 F (36.7 C) (!) 97.5 F (36.4 C) 98.3 F (36.8 C) (!) 97.5 F (36.4 C)  TempSrc: Oral Oral Oral Oral  SpO2: 100% 100% Marland Kitchen)  82%   Weight:      Height:        Intake/Output Summary (Last 24 hours) at 07/17/2023 1421 Last data filed at 07/17/2023 0000 Gross per 24 hour  Intake 923.61 ml  Output 100 ml  Net 823.61 ml   Filed Weights   07/12/23 1722  Weight: 68.9 kg    Examination:  Physical Exam Vitals and nursing note reviewed.  Constitutional:      General: He is not in acute distress.    Appearance: He is not toxic-appearing or diaphoretic.     Comments: Thin, frail, chronically ill appearing AAM.  HENT:     Head: Normocephalic and atraumatic.     Nose: Nose normal.  Eyes:     General: No scleral icterus. Pulmonary:     Effort: Pulmonary effort is normal.  Musculoskeletal:     Right lower leg: No edema.     Left lower leg: No edema.  Skin:    General: Skin is warm and dry.     Capillary Refill: Capillary refill takes less than 2 seconds.  Neurological:     Mental Status: He is alert.     Data Reviewed: I have personally reviewed following labs and imaging  studies  CBC: Recent Labs  Lab 07/12/23 1815 07/13/23 0443 07/14/23 0459 07/17/23 0945  WBC 6.0 6.2 5.4 5.3  NEUTROABS 4.3  --  3.4  --   HGB 9.3* 9.1* 8.6* 9.2*  HCT 31.3* 30.0* 28.4* 30.8*  MCV 79.6* 79.2* 79.8* 81.5  PLT 212 200 164 196   Basic Metabolic Panel: Recent Labs  Lab 07/12/23 1815 07/13/23 0443 07/14/23 0459 07/17/23 0945  NA 137 137 136 135  K 2.9* 3.6 3.9 4.4  CL 102 104 104 103  CO2 27 27 27 25   GLUCOSE 72 85 105* 71  BUN 21 19 19 13   CREATININE 0.90 0.69 0.86 0.75  CALCIUM 7.1* 7.1* 7.1* 7.5*  MG 2.1  --   --  2.1   GFR: Estimated Creatinine Clearance: 70 mL/min (by C-G formula based on SCr of 0.75 mg/dL). Liver Function Tests: Recent Labs  Lab 07/12/23 1815 07/17/23 0945  AST 17 16  ALT 13 13  ALKPHOS 97 86  BILITOT 0.7 0.4  PROT 4.7* 5.0*  ALBUMIN <1.5* 1.6*   Coagulation Profile: Recent Labs  Lab 07/12/23 1815  INR 1.2   CBG: Recent Labs  Lab 07/14/23 0621 07/14/23 0734 07/14/23 1159 07/14/23 1205 07/14/23 1218  GLUCAP 62* 80 24* 27* 135*   Sepsis Labs: Recent Labs  Lab 07/12/23 1844 07/12/23 1945  LATICACIDVEN 1.0 1.2    Recent Results (from the past 240 hour(s))  Urine Culture     Status: Abnormal   Collection Time: 07/12/23  6:15 PM   Specimen: Urine, Random  Result Value Ref Range Status   Specimen Description   Final    URINE, RANDOM Performed at [facility], [address], [place], [place] [address]    Special Requests   Final    NONE Reflexed from [id number] Performed at [facility], [address] [place], [place] [address]    Culture >=100,000 COLONIES/mL KLEBSIELLA PNEUMONIAE (A)  Final   Report Status 07/14/2023 FINAL  Final   Organism ID, Bacteria KLEBSIELLA PNEUMONIAE (A)  Final      Susceptibility   Klebsiella pneumoniae - MIC*    AMPICILLIN RESISTANT Resistant     CEFAZOLIN <=4 SENSITIVE Sensitive     CEFEPIME <=0.12  SENSITIVE Sensitive     CEFTRIAXONE  <=0.25 SENSITIVE Sensitive     CIPROFLOXACIN <=0.25 SENSITIVE Sensitive     GENTAMICIN <=1 SENSITIVE Sensitive     IMIPENEM <=0.25 SENSITIVE Sensitive     NITROFURANTOIN 32 SENSITIVE Sensitive     TRIMETH/SULFA <=20 SENSITIVE Sensitive     AMPICILLIN/SULBACTAM 4 SENSITIVE Sensitive     PIP/TAZO <=4 SENSITIVE Sensitive     * >=100,000 COLONIES/mL KLEBSIELLA PNEUMONIAE  Blood Culture (routine x 2)     Status: Abnormal   Collection Time: 07/12/23  8:25 PM   Specimen: BLOOD RIGHT HAND  Result Value Ref Range Status   Specimen Description   Final    BLOOD RIGHT HAND Performed at [facility], [address], [place], [place] [address]    Special Requests   Final    BOTTLES DRAWN AEROBIC AND ANAEROBIC Blood Culture adequate volume Performed at [facility], [address] [place], [place] [address]    Culture  Setup Time   Final    GRAM NEGATIVE RODS AEROBIC BOTTLE ONLY CRITICAL RESULT CALLED TO, READ BACK BY AND VERIFIED WITH: PHARMD J LEGGE [id number] AT 1332 BY CM Performed at [facility], [address] [place], [place] [address]    Culture KLEBSIELLA PNEUMONIAE (A)  Final   Report Status 07/15/2023 FINAL  Final   Organism ID, Bacteria KLEBSIELLA PNEUMONIAE  Final      Susceptibility   Klebsiella pneumoniae - MIC*    AMPICILLIN RESISTANT Resistant     CEFEPIME <=0.12 SENSITIVE Sensitive     CEFTAZIDIME <=1 SENSITIVE Sensitive     CEFTRIAXONE <=0.25 SENSITIVE Sensitive     CIPROFLOXACIN <=0.25 SENSITIVE Sensitive     GENTAMICIN <=1 SENSITIVE Sensitive     IMIPENEM <=0.25 SENSITIVE Sensitive     TRIMETH/SULFA <=20 SENSITIVE Sensitive     AMPICILLIN/SULBACTAM 4 SENSITIVE Sensitive     PIP/TAZO <=4 SENSITIVE Sensitive     * KLEBSIELLA PNEUMONIAE  Blood Culture ID Panel (Reflexed)     Status: Abnormal   Collection Time: 07/12/23  8:25 PM  Result Value Ref Range Status   Enterococcus faecalis NOT DETECTED NOT DETECTED Final    Enterococcus Faecium NOT DETECTED NOT DETECTED Final   Listeria monocytogenes NOT DETECTED NOT DETECTED Final   Staphylococcus species NOT DETECTED NOT DETECTED Final   Staphylococcus aureus (BCID) NOT DETECTED NOT DETECTED Final   Staphylococcus epidermidis NOT DETECTED NOT DETECTED Final   Staphylococcus lugdunensis NOT DETECTED NOT DETECTED Final   Streptococcus species NOT DETECTED NOT DETECTED Final   Streptococcus agalactiae NOT DETECTED NOT DETECTED Final   Streptococcus pneumoniae NOT DETECTED NOT DETECTED Final   Streptococcus pyogenes NOT DETECTED NOT DETECTED Final   A.calcoaceticus-baumannii NOT DETECTED NOT DETECTED Final   Bacteroides fragilis NOT DETECTED NOT DETECTED Final   Enterobacterales DETECTED (A) NOT DETECTED Final    Comment: Enterobacterales represent a large order of gram negative bacteria, not a single organism. CRITICAL RESULT CALLED TO, READ BACK BY AND VERIFIED WITH: PHARMD J LEGGE [id number] AT 1332 BY CM    Enterobacter cloacae complex NOT DETECTED NOT DETECTED Final   Escherichia coli NOT DETECTED NOT DETECTED Final   Klebsiella aerogenes NOT DETECTED NOT DETECTED Final   Klebsiella oxytoca NOT DETECTED NOT DETECTED Final   Klebsiella pneumoniae DETECTED (A) NOT DETECTED Final    Comment: CRITICAL RESULT CALLED TO, READ BACK BY AND VERIFIED WITH: PHARMD J LEGGE [id number] AT 1332 BY CM    Proteus  species NOT DETECTED NOT DETECTED Final   Salmonella species NOT DETECTED NOT DETECTED Final   Serratia marcescens NOT DETECTED NOT DETECTED Final   Haemophilus influenzae NOT DETECTED NOT DETECTED Final   Neisseria meningitidis NOT DETECTED NOT DETECTED Final   Pseudomonas aeruginosa NOT DETECTED NOT DETECTED Final   Stenotrophomonas maltophilia NOT DETECTED NOT DETECTED Final   Candida albicans NOT DETECTED NOT DETECTED Final   Candida auris NOT DETECTED NOT DETECTED Final   Candida glabrata NOT DETECTED NOT DETECTED Final   Candida krusei NOT DETECTED NOT  DETECTED Final   Candida parapsilosis NOT DETECTED NOT DETECTED Final   Candida tropicalis NOT DETECTED NOT DETECTED Final   Cryptococcus neoformans/gattii NOT DETECTED NOT DETECTED Final   CTX-M ESBL NOT DETECTED NOT DETECTED Final   Carbapenem resistance IMP NOT DETECTED NOT DETECTED Final   Carbapenem resistance KPC NOT DETECTED NOT DETECTED Final   Carbapenem resistance NDM NOT DETECTED NOT DETECTED Final   Carbapenem resist OXA 48 LIKE NOT DETECTED NOT DETECTED Final   Carbapenem resistance VIM NOT DETECTED NOT DETECTED Final    Comment: Performed at [facility], [address], [place], [place] [address]     Radiology Studies: DG Abd 1 View  Result Date: 07/16/2023 CLINICAL DATA:  Vomiting.  Metastatic colon cancer. EXAM: ABDOMEN - 1 VIEW COMPARISON:  CT abdomen/pelvis dated 06/23/2023. FINDINGS: Nonspecific but nonobstructive bowel gas pattern. Visualized osseous structures are within normal limits. IMPRESSION: Negative. Electronically Signed   By: Charline Bills M.D.   On: 07/16/2023 19:35    Scheduled Meds:  amLODipine  10 mg Oral Daily   carvedilol  25 mg Oral BID WC   Chlorhexidine Gluconate Cloth  6 each Topical Daily   dronabinol  2.5 mg Oral QAC lunch   enoxaparin (LOVENOX) injection  100 mg Subcutaneous Q24H   feeding supplement  237 mL Oral BID BM   ferrous sulfate  325 mg Oral BID WC   potassium chloride  20 mEq Oral BID   sodium chloride flush  10-40 mL Intracatheter Q12H   Continuous Infusions:  cefTRIAXone (ROCEPHIN)  IV 2 g (07/17/23 0917)   lactated ringers with KCl 20 mEq/L       LOS: 5 days   Time spent: 35 minutes  Carollee Herter, DO  [facility]  07/17/2023, 2:21 PM

## 2023-07-18 MED ORDER — LABETALOL HCL 5 MG/ML IV SOLN
10.0000 mg | INTRAVENOUS | Status: DC | PRN
  Administered 2023-07-18 – 2023-07-20 (×6): 10 mg via INTRAVENOUS
  Filled 2023-07-18 (×9): qty 4

## 2023-07-18 NOTE — [place] FL2 (Signed)
[place] MEDICAID FL2 LEVEL OF CARE FORM     IDENTIFICATION  Patient Name: Aaron Roth Birthdate: 08-Jan-1946 Sex: male Admission Date (Current Location): 07/12/2023  [facility] and [place] Number:  Producer, television/film/video and Address:  [facility],  [address] [place], [place] [address]      Provider Number:    Attending Physician Name and Address:  Carollee Herter, DO  Relative Name and Phone Number:  Roger,Penelope (Spouse)  [phone] (Mobile)    Current Level of Care: Hospital Recommended Level of Care: Skilled Nursing Facility Prior Approval Number:    Date Approved/Denied:   PASRR Number: [id number]  Discharge Plan: SNF    Current Diagnoses: Patient Active Problem List   Diagnosis Date Noted   DNR (do not resuscitate)/DNI(Do Not Intubate) 07/16/2023   Septicemia due to Klebsiella pneumoniae (HCC) 07/14/2023   Acute metabolic encephalopathy 07/12/2023   Hypocalcemia 07/12/2023   Hypokalemia 07/12/2023   UTI due to Klebsiella species 07/12/2023   Hypoglycemia 07/12/2023   History of pulmonary embolus (PE) 07/12/2023   History of DVT (deep vein thrombosis) 07/12/2023   Weakness 07/12/2023   Colon cancer, ascending (HCC) 11/26/2022   Skin rash 03/05/2022   Status post cataract removal 10/16/2021   Tobacco use 09/24/2020   Iron deficiency anemia due to chronic blood loss 08/10/2020   Essential hypertension 08/10/2020   Dermatitis 08/10/2020   Influenza vaccination declined 08/10/2020   History of colon cancer, T3, N2 (4/15 nodes), colectomy 09/25/2011. 10/14/2011   GERD (gastroesophageal reflux disease)     Orientation RESPIRATION BLADDER Height & Weight     Self, Time, Situation, Place  Normal External catheter Weight: 152 lb (68.9 kg) Height:  5\' 7"  (170.2 cm)  BEHAVIORAL SYMPTOMS/MOOD NEUROLOGICAL BOWEL NUTRITION STATUS      Continent Diet  AMBULATORY STATUS COMMUNICATION OF NEEDS Skin   Limited Assist Verbally Other (Comment) (Dry Flaky)                        Personal Care Assistance Level of Assistance  Bathing, Feeding, Dressing Bathing Assistance: Limited assistance Feeding assistance: Limited assistance Dressing Assistance: Limited assistance     Functional Limitations Info   (None listed)          SPECIAL CARE FACTORS FREQUENCY  PT (By licensed PT), OT (By licensed OT)     PT Frequency: 5X a week OT Frequency: 5X a week            Contractures Contractures Info: Not present    Additional Factors Info  Code Status, Allergies Code Status Info: DNR Limited Allergies Info: Hydralazine, Other, Oxaliplatin           Current Medications (07/18/2023):  This is the current hospital active medication list Current Facility-Administered Medications  Medication Dose Route Frequency Provider Last Rate Last Admin   amLODipine (NORVASC) tablet 10 mg  10 mg Oral Daily Tu, Ching T, DO   10 mg at 07/15/23 0933   carvedilol (COREG) tablet 25 mg  25 mg Oral BID WC Tu, Ching T, DO   25 mg at 07/15/23 1626   ceFAZolin (ANCEF) IVPB 2g/100 mL premix  2 g Intravenous Q8H Carollee Herter, DO 200 mL/hr at 07/18/23 1136 2 g at 07/18/23 1136   Chlorhexidine Gluconate Cloth 2 % PADS 6 each  6 each Topical Daily Lorin Glass, MD   6 each at 07/15/23 0933   dronabinol (MARINOL) capsule 2.5 mg  2.5 mg Oral QAC lunch Imogene Burn,  Eric, DO   2.5 mg at 07/16/23 1158   enoxaparin (LOVENOX) injection 100 mg  100 mg Subcutaneous Q24H Terrilee Files T, RPH   100 mg at 07/14/23 2123   feeding supplement (ENSURE ENLIVE / ENSURE PLUS) liquid 237 mL  237 mL Oral BID BM Carollee Herter, DO   237 mL at 07/17/23 1706   ferrous sulfate tablet 325 mg  325 mg Oral BID WC Tu, Ching T, DO   325 mg at 07/15/23 1626   labetalol (NORMODYNE) injection 10 mg  10 mg Intravenous Q4H PRN Carollee Herter, DO       ondansetron [facility]) injection 4 mg  4 mg Intravenous Q6H PRN Carollee Herter, DO   4 mg at 07/16/23 1309   Oral care mouth rinse  15 mL Mouth Rinse PRN Carollee Herter,  DO       potassium chloride SA (KLOR-CON M) CR tablet 20 mEq  20 mEq Oral BID Tu, Ching T, DO   20 mEq at 07/16/23 0948   sodium chloride flush (NS) 0.9 % injection 10-40 mL  10-40 mL Intracatheter Q12H Carollee Herter, DO   10 mL at 07/17/23 1706   Facility-Administered Medications Ordered in Other Encounters  Medication Dose Route Frequency Provider Last Rate Last Admin   sodium chloride flush (NS) 0.9 % injection 10 mL  10 mL Intravenous PRN Rana Snare, NP   10 mL at 12/21/22 1023   sodium chloride flush (NS) 0.9 % injection 10 mL  10 mL Intravenous PRN Ladene Artist, MD   10 mL at 03/08/23 1212     Discharge Medications: Please see discharge summary for a list of discharge medications.  Relevant Imaging Results:  Relevant Lab Results:   Additional Information [id number]  Catalina Gravel, LCSW

## 2023-07-18 NOTE — Plan of Care (Signed)
Problem: Education: Goal: Knowledge of General Education information will improve Description: Including pain rating scale, medication(s)/side effects and non-pharmacologic comfort measures Outcome: Progressing   Problem: Health Behavior/Discharge Planning: Goal: Ability to manage health-related needs will improve Outcome: Progressing   Problem: Clinical Measurements: Goal: Ability to maintain clinical measurements within normal limits will improve Outcome: Progressing Goal: Will remain free from infection Outcome: Progressing Goal: Diagnostic test results will improve Outcome: Progressing Goal: Respiratory complications will improve Outcome: Progressing   Problem: Activity: Goal: Risk for activity intolerance will decrease Outcome: Progressing   Problem: Coping: Goal: Level of anxiety will decrease Outcome: Progressing   Problem: Elimination: Goal: Will not experience complications related to bowel motility Outcome: Progressing Goal: Will not experience complications related to urinary retention Outcome: Progressing   Problem: Pain Managment: Goal: General experience of comfort will improve Outcome: Progressing   Problem: Safety: Goal: Ability to remain free from injury will improve Outcome: Progressing   Problem: Skin Integrity: Goal: Risk for impaired skin integrity will decrease Outcome: Progressing

## 2023-07-18 NOTE — Progress Notes (Signed)
PROGRESS NOTE    Aaron Roth  ZOX:[id number] DOB: 1945/12/26 DOA: 07/12/2023 PCP: Storm Frisk, MD  Subjective: Pt seen and examined. Wife not at bedside Pt continues to refuse medications. Awaiting SNF placement.   Hospital Course: HPI: Aaron Roth is a 77 y.o. 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   Wife and son provides hx. Wife was asleep and heard pt call for help.Found to all the floor in the bathroom laying on his back and trying to get up. Pt does not really recall the fall but denies loss of consciousness. Son had trouble getting him out of bed so decided to come to ED. Denies any fever or chills.  No nausea, vomiting but has intermittent diarrhea since being on chemotherapy.  Has decreased appetite and has been losing weight.     In the ED, he had temperature of 100 F, blood pressure 130/95 on room air.   CBC without leukocytosis, hemoglobin 9.3 with baseline of 7-8.  Lactate within normal limits.   CMP notable for hypokalemia of 2.9, calcium 7.1 with albumin less than 1.5, creatinine stable at 0.90.  Magnesium within normal limits.   Troponin mildly elevated 34, BNP of 114.   UA was positive with large leukocyte, positive nitrite many bacteria.  Chest x-ray negative.  Positive FOBT likely secondary to his colon cancer as his hemoglobin is stable.   He also had hypoglycemia down to 13 although ED physician questions the validity of the test as patient was mentating well.  He was given D10 and had a repeat glucose that came up to 170s.  Significant Events: Admitted 07/12/2023 BG obtained for port-a-cath showed 135, despite fingerstick BG reading 26. 07-14-2023 pt started on Marinol for appetite stimulation Pt made DNR/DNI by palliative care on 07-15-2023  Significant Labs: Blood cx 07-12-2023 growing Klebsiella Urine cx 07-12-2023  growing Klebsiella pan-sensitive except for ampicillin  Significant Imaging Studies: 07-12-2023 CT head negative for CVA, shows small right parietal scalp hematoma  Antibiotic Therapy: Anti-infectives (From admission, onward)    Start     Dose/Rate Route Frequency Ordered Stop   07/17/23 0815  cefTRIAXone (ROCEPHIN) 2 g in sodium chloride 0.9 % 100 mL IVPB        2 g 200 mL/hr over 30 Minutes Intravenous Daily 07/17/23 0756     07/15/23 1000  cefadroxil (DURICEF) capsule 1,000 mg  Status:  Discontinued        1,000 mg Oral 2 times daily 07/15/23 0839 07/17/23 0756   07/13/23 1400  cefTRIAXone (ROCEPHIN) 2 g in sodium chloride 0.9 % 100 mL IVPB  Status:  Discontinued        2 g 200 mL/hr over 30 Minutes Intravenous Every 24 hours 07/13/23 1349 07/15/23 0839   07/12/23 2300  cefTRIAXone (ROCEPHIN) 1 g in sodium chloride 0.9 % 100 mL IVPB  Status:  Discontinued        1 g 200 mL/hr over 30 Minutes Intravenous Every 24 hours 07/12/23 2234 07/13/23 1349       Procedures:   Consultants: Oncology - [place]    Assessment and Plan: * UTI due to Klebsiella species Urine cx growing Klebsiella pneumoniae.  Sensitive to Rocephin. IV rocephin started 07-12-2023. Stopped on 07-15-2023.  Started on [facility] 07-15-2023  Due to N/V and pt refusing to take po meds. IV Ancef started 07-17-2023 by pharmacy recommendations.  Septicemia due  to Klebsiella pneumoniae (HCC) Started initially on IV rocephin. Blood MIC similar to urine cx. IV rocephin stopped on 07-15-2023.  discussed with pharmacy and decision made to start Duricef 1000 mg bid.  Due to pt refusal to take PO meds. IV Ancef started 07-17-2023.  ulture KLEBSIELLA PNEUMONIAE Abnormal   Report Status 07/15/2023 FINAL  Organism ID, Bacteria KLEBSIELLA PNEUMONIAE  Resulting Agency [facility]     Susceptibility    Klebsiella pneumoniae    MIC    AMPICILLIN RESISTANT Resistant    AMPICILLIN/SULBACTAM 4 SENSITIVE Sensitive    CEFEPIME  <=0.12 SENS... Sensitive    CEFTAZIDIME <=1 SENSITIVE Sensitive    CEFTRIAXONE <=0.25 SENS... Sensitive    CIPROFLOXACIN <=0.25 SENS... Sensitive    GENTAMICIN <=1 SENSITIVE Sensitive    IMIPENEM <=0.25 SENS... Sensitive    PIP/TAZO <=4 SENSITIVE Sensitive    TRIMETH/SULFA <=20 SENSIT... Sensitive               Weakness Secondary to UTI and dehydration Continue with PT  History of DVT (deep vein thrombosis) Continue with therapeutic dose lovenox.  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 BG from port-a-cath was normal despite CBG from finger saying BG was low. Stopped accuchecks.  Hypokalemia Resolved after IV and oral potassium replacement.  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 oncology has recommended against starting salvage therapy with Lonsurf. Pt performance status is poor. Pt with overall very poor prognosis. Oncology has discussed prognosis with pt's wife. She understands that pt is not candidate for chemo any longer.  07-17-2023 I explained to wife that pt is dying from his progressive cancer. Nothing can stop that. She seems to understand it but then asks if his appetite will improve. I explained to her that pt's appetite will likely not improve as his cancer is progressive.  Essential hypertension Continue amlodipine and Coreg Add prn labetalol 10 mg IV q4h prn SBP >170 or DBP >100 since pt continues to refuse medications. May need to place him on clonidine patch for BP control.  Iron deficiency anemia due to chronic blood loss - Stable.  Hemoglobin of 8.6 with baseline around 7-8  DNR (do not resuscitate)/DNI(Do Not Intubate) GOC discussion held by palliative care 07-15-2023. I really appreciate their involvement.  Pt's code status changed to DNR/DNI.       DVT  prophylaxis:   Lovenox   Code Status: Limited: Do not attempt resuscitation (DNR) -DNR-LIMITED -Do Not Intubate/DNI  Family Communication: no family at bedside today Disposition Plan: SNF Reason for continuing need for hospitalization: medically stable for DC to SNF. Awaiting SNF bed offers and insurance authorization.  Objective: Vitals:   07/16/23 2050 07/17/23 0612 07/17/23 1504 07/18/23 0559  BP: (!) 152/112 (!) 150/90 (!) 144/108 (!) 143/102  Pulse:  67 94 73  Resp: 16 16 16 16   Temp:  (!) 97.5 F (36.4 C) 97.8 F (36.6 C) 98.1 F (36.7 C)  TempSrc: Oral Oral Oral Oral  SpO2: (!) 82%   93%  Weight:      Height:        Intake/Output Summary (Last 24 hours) at 07/18/2023 0952 Last data filed at 07/18/2023 0622 Gross per 24 hour  Intake 675 ml  Output 400 ml  Net 275 ml   Filed Weights   07/12/23 1722  Weight: 68.9 kg    Examination:  Physical Exam Vitals  and nursing note reviewed.  Constitutional:      General: He is not in acute distress.    Appearance: He is not toxic-appearing.     Comments: Thin, frail, AAM no distress  HENT:     Head: Normocephalic and atraumatic.  Cardiovascular:     Rate and Rhythm: Normal rate and regular rhythm.  Pulmonary:     Effort: Pulmonary effort is normal. No respiratory distress.  Abdominal:     General: There is no distension.     Comments: Multiple hard masses in his abd. Mostly in lower abd area.  Musculoskeletal:     Right lower leg: 1+ Edema present.     Left lower leg: 1+ Edema present.     Right ankle: Swelling present.     Left ankle: Swelling present.     Right foot: Swelling present.     Left foot: Swelling present.  Skin:    General: Skin is warm and dry.     Capillary Refill: Capillary refill takes less than 2 seconds.     Data Reviewed: I have personally reviewed following labs and imaging studies  CBC: Recent Labs  Lab 07/12/23 1815 07/13/23 0443 07/14/23 0459 07/17/23 0945  WBC 6.0 6.2 5.4 5.3   NEUTROABS 4.3  --  3.4  --   HGB 9.3* 9.1* 8.6* 9.2*  HCT 31.3* 30.0* 28.4* 30.8*  MCV 79.6* 79.2* 79.8* 81.5  PLT 212 200 164 196   Basic Metabolic Panel: Recent Labs  Lab 07/12/23 1815 07/13/23 0443 07/14/23 0459 07/17/23 0945  NA 137 137 136 135  K 2.9* 3.6 3.9 4.4  CL 102 104 104 103  CO2 27 27 27 25   GLUCOSE 72 85 105* 71  BUN 21 19 19 13   CREATININE 0.90 0.69 0.86 0.75  CALCIUM 7.1* 7.1* 7.1* 7.5*  MG 2.1  --   --  2.1   GFR: Estimated Creatinine Clearance: 70 mL/min (by C-G formula based on SCr of 0.75 mg/dL). Liver Function Tests: Recent Labs  Lab 07/12/23 1815 07/17/23 0945  AST 17 16  ALT 13 13  ALKPHOS 97 86  BILITOT 0.7 0.4  PROT 4.7* 5.0*  ALBUMIN <1.5* 1.6*   Coagulation Profile: Recent Labs  Lab 07/12/23 1815  INR 1.2   CBG: Recent Labs  Lab 07/14/23 0621 07/14/23 0734 07/14/23 1159 07/14/23 1205 07/14/23 1218  GLUCAP 62* 80 24* 27* 135*   Sepsis Labs: Recent Labs  Lab 07/12/23 1844 07/12/23 1945  LATICACIDVEN 1.0 1.2    Recent Results (from the past 240 hour(s))  Urine Culture     Status: Abnormal   Collection Time: 07/12/23  6:15 PM   Specimen: Urine, Random  Result Value Ref Range Status   Specimen Description   Final    URINE, RANDOM Performed at [facility], [address], [place], [place] [address]    Special Requests   Final    NONE Reflexed from [id number] Performed at [facility], [address], [place], [place] [address]    Culture >=100,000 COLONIES/mL KLEBSIELLA PNEUMONIAE (A)  Final   Report Status 07/14/2023 FINAL  Final   Organism ID, Bacteria KLEBSIELLA PNEUMONIAE (A)  Final      Susceptibility   Klebsiella pneumoniae - MIC*    AMPICILLIN RESISTANT Resistant     CEFAZOLIN <=4 SENSITIVE Sensitive     CEFEPIME <=0.12 SENSITIVE Sensitive     CEFTRIAXONE <=0.25 SENSITIVE Sensitive     CIPROFLOXACIN <=0.25 SENSITIVE Sensitive  GENTAMICIN <=1 SENSITIVE Sensitive      IMIPENEM <=0.25 SENSITIVE Sensitive     NITROFURANTOIN 32 SENSITIVE Sensitive     TRIMETH/SULFA <=20 SENSITIVE Sensitive     AMPICILLIN/SULBACTAM 4 SENSITIVE Sensitive     PIP/TAZO <=4 SENSITIVE Sensitive     * >=100,000 COLONIES/mL KLEBSIELLA PNEUMONIAE  Blood Culture (routine x 2)     Status: Abnormal   Collection Time: 07/12/23  8:25 PM   Specimen: BLOOD RIGHT HAND  Result Value Ref Range Status   Specimen Description   Final    BLOOD RIGHT HAND Performed at [facility], [address] [place], [place] [address]    Special Requests   Final    BOTTLES DRAWN AEROBIC AND ANAEROBIC Blood Culture adequate volume Performed at [facility], [address], [place], [place] [address]    Culture  Setup Time   Final    GRAM NEGATIVE RODS AEROBIC BOTTLE ONLY CRITICAL RESULT CALLED TO, READ BACK BY AND VERIFIED WITH: PHARMD J LEGGE [id number] AT 1332 BY CM Performed at [facility], [address], [place], [place] [address]    Culture KLEBSIELLA PNEUMONIAE (A)  Final   Report Status 07/15/2023 FINAL  Final   Organism ID, Bacteria KLEBSIELLA PNEUMONIAE  Final      Susceptibility   Klebsiella pneumoniae - MIC*    AMPICILLIN RESISTANT Resistant     CEFEPIME <=0.12 SENSITIVE Sensitive     CEFTAZIDIME <=1 SENSITIVE Sensitive     CEFTRIAXONE <=0.25 SENSITIVE Sensitive     CIPROFLOXACIN <=0.25 SENSITIVE Sensitive     GENTAMICIN <=1 SENSITIVE Sensitive     IMIPENEM <=0.25 SENSITIVE Sensitive     TRIMETH/SULFA <=20 SENSITIVE Sensitive     AMPICILLIN/SULBACTAM 4 SENSITIVE Sensitive     PIP/TAZO <=4 SENSITIVE Sensitive     * KLEBSIELLA PNEUMONIAE  Blood Culture ID Panel (Reflexed)     Status: Abnormal   Collection Time: 07/12/23  8:25 PM  Result Value Ref Range Status   Enterococcus faecalis NOT DETECTED NOT DETECTED Final   Enterococcus Faecium NOT DETECTED NOT DETECTED Final   Listeria monocytogenes NOT DETECTED NOT DETECTED Final    Staphylococcus species NOT DETECTED NOT DETECTED Final   Staphylococcus aureus (BCID) NOT DETECTED NOT DETECTED Final   Staphylococcus epidermidis NOT DETECTED NOT DETECTED Final   Staphylococcus lugdunensis NOT DETECTED NOT DETECTED Final   Streptococcus species NOT DETECTED NOT DETECTED Final   Streptococcus agalactiae NOT DETECTED NOT DETECTED Final   Streptococcus pneumoniae NOT DETECTED NOT DETECTED Final   Streptococcus pyogenes NOT DETECTED NOT DETECTED Final   A.calcoaceticus-baumannii NOT DETECTED NOT DETECTED Final   Bacteroides fragilis NOT DETECTED NOT DETECTED Final   Enterobacterales DETECTED (A) NOT DETECTED Final    Comment: Enterobacterales represent a large order of gram negative bacteria, not a single organism. CRITICAL RESULT CALLED TO, READ BACK BY AND VERIFIED WITH: PHARMD J LEGGE [id number] AT 1332 BY CM    Enterobacter cloacae complex NOT DETECTED NOT DETECTED Final   Escherichia coli NOT DETECTED NOT DETECTED Final   Klebsiella aerogenes NOT DETECTED NOT DETECTED Final   Klebsiella oxytoca NOT DETECTED NOT DETECTED Final   Klebsiella pneumoniae DETECTED (A) NOT DETECTED Final    Comment: CRITICAL RESULT CALLED TO, READ BACK BY AND VERIFIED WITH: PHARMD J LEGGE [id number] AT 1332 BY CM    Proteus species NOT DETECTED NOT DETECTED Final   Salmonella species NOT DETECTED NOT DETECTED Final   Serratia marcescens NOT DETECTED NOT  DETECTED Final   Haemophilus influenzae NOT DETECTED NOT DETECTED Final   Neisseria meningitidis NOT DETECTED NOT DETECTED Final   Pseudomonas aeruginosa NOT DETECTED NOT DETECTED Final   Stenotrophomonas maltophilia NOT DETECTED NOT DETECTED Final   Candida albicans NOT DETECTED NOT DETECTED Final   Candida auris NOT DETECTED NOT DETECTED Final   Candida glabrata NOT DETECTED NOT DETECTED Final   Candida krusei NOT DETECTED NOT DETECTED Final   Candida parapsilosis NOT DETECTED NOT DETECTED Final   Candida tropicalis NOT DETECTED NOT DETECTED  Final   Cryptococcus neoformans/gattii NOT DETECTED NOT DETECTED Final   CTX-M ESBL NOT DETECTED NOT DETECTED Final   Carbapenem resistance IMP NOT DETECTED NOT DETECTED Final   Carbapenem resistance KPC NOT DETECTED NOT DETECTED Final   Carbapenem resistance NDM NOT DETECTED NOT DETECTED Final   Carbapenem resist OXA 48 LIKE NOT DETECTED NOT DETECTED Final   Carbapenem resistance VIM NOT DETECTED NOT DETECTED Final    Comment: Performed at [facility], [address], [place], [place] [address]     Radiology Studies: DG Abd 1 View  Result Date: 07/16/2023 CLINICAL DATA:  Vomiting.  Metastatic colon cancer. EXAM: ABDOMEN - 1 VIEW COMPARISON:  CT abdomen/pelvis dated 06/23/2023. FINDINGS: Nonspecific but nonobstructive bowel gas pattern. Visualized osseous structures are within normal limits. IMPRESSION: Negative. Electronically Signed   By: Charline Bills M.D.   On: 07/16/2023 19:35    Scheduled Meds:  amLODipine  10 mg Oral Daily   carvedilol  25 mg Oral BID WC   Chlorhexidine Gluconate Cloth  6 each Topical Daily   dronabinol  2.5 mg Oral QAC lunch   enoxaparin (LOVENOX) injection  100 mg Subcutaneous Q24H   feeding supplement  237 mL Oral BID BM   ferrous sulfate  325 mg Oral BID WC   potassium chloride  20 mEq Oral BID   sodium chloride flush  10-40 mL Intracatheter Q12H   Continuous Infusions:   ceFAZolin (ANCEF) IV Stopped (07/18/23 0217)     LOS: 6 days   Time spent: 35 minutes  Carollee Herter, DO  [facility]  07/18/2023, 9:52 AM

## 2023-07-18 NOTE — Progress Notes (Signed)
Palliative Medicine Progress Note   Patient Name: Aaron Roth       Date: 07/18/2023 DOB: [birth year]  Age: 77 y.o. MRN#: [id number] Attending Physician: Carollee Herter, DO Primary Care Physician: Storm Frisk, MD Admit Date: 07/12/2023    HPI/Patient Profile: 77 y.o. male  with past medical history of metastatic colon cancer, pulmonary embolism, iron deficiency anemia, and GERD who presented to the ED on 07/12/2023 with weakness and fall.  He was found at home by his family on the bathroom floor.  CT head was negative for CVA and showed small right parietal scalp hematoma.  He was admitted with concern for UTI, which was later confirmed by culture growing Klebsiella.  Blood culture also positive for klebsiella. Palliative Medicine was consulted for GOC.   Subjective: Chart reviewed and patient assessed. He sleeps throughout most of my visit.   I had a long discussion with wife and son at bedside. They verbalize understanding of "where things are" in terms of prognosis, and that patient is not a candidate for further chemotherapy.   Discussed disposition plan for SNF/rehab to hopefully allow opportunity for patient to improve his strength and functional status. Ultimately goal if for him to return home after rehab.   I discussed with family my recommendation to consider the addition of hospice support when patient returns home. Provided education and counseling on the philosophy and benefits of hospice care. Discussed that it offers a holistic approach to care in the setting of end-stage disease, and is about supporting the patient where they are while allowing the natural course to occur. Discussed that hospice can provide personal care, support for the family, and symptom management when his  condition further declines.  Family seems open and agreeable to the idea of home hospice services, and express appreciation for the information. I also gave them a list of hospice agencies (from [email or web address]) that service the area of [facility].     Objective:  Physical Exam Vitals reviewed.  Constitutional:      General: He is sleeping. He is not in acute distress.    Appearance: He is ill-appearing.  Pulmonary:     Effort: Pulmonary effort is normal.  Neurological:     Mental Status: He is lethargic.     Motor: Weakness present.  Palliative Medicine Assessment & Plan   Assessment: Principal Problem:   UTI due to Klebsiella species Active Problems:   Iron deficiency anemia due to chronic blood loss   Essential hypertension   Colon cancer, ascending (HCC)   Hypocalcemia   Hypokalemia   Hypoglycemia   History of pulmonary embolus (PE)   History of DVT (deep vein thrombosis)   Weakness   Septicemia due to Klebsiella pneumoniae (HCC)   DNR (do not resuscitate)/DNI(Do Not Intubate)    Recommendations/Plan: Continue current supportive interventions Family understands patient is not a candidate for further chemotherapy Plan is hopefully for patient to return home after rehab Discussed hospice services at home and provided family with a ist of agencies    Primary Decision Maker: Due to ongoing confusion, patient is unable to make complex medical decisions  Code Status: DNR - limited   Prognosis:  < 6 months  Discharge Planning: SNF/rehab   Thank you for allowing the Palliative Medicine Team to assist in the care of this patient.   MDM - High   Merry Proud, NP   Please contact Palliative Medicine Team phone at [phone] for questions and concerns.  For individual providers, please see AMION.

## 2023-07-18 NOTE — Progress Notes (Signed)
Patient refused potassium tablet and Lovenox injection tonight. He was explained the risks and still states that he does not want to take anything at all but his antibiotic tonight. NP on call notified.Priscille Kluver

## 2023-07-18 NOTE — TOC Progression Note (Addendum)
Transition of Care [facility]) - Progression Note    Patient Details  Name: KOU BELLANTI MRN: [id number] Date of Birth: Nov 09, 1945  [facility] CM/SW Contact  Catalina [place], [place] Phone Number: 07/18/2023, 1:56 PM  Clinical Narrative:    Pt and family interested in SNF.  CSW spoke with spouse today who states pt wants rehab facility.  Spouse states that [facility] preferred location as they have heard positive things about the program. A rehab facility near [place] is next option so family is close to visit.   CSW completed PASSR and FL2- sent for bed offers. TOC to follow.   Addendum: CSW did not see note indicating that  SNF was in process from 9/13.  [facility] showing Pending in [facility].  TOC to follow for potential bed offers.    Barriers to Discharge: Continued Medical Work up  Expected Discharge Plan and Services                                               Social Determinants of Health (SDOH) Interventions SDOH Screenings   Food Insecurity: Food Insecurity Present (07/14/2023)  Housing: Medium Risk (07/14/2023)  Transportation Needs: No Transportation Needs (07/14/2023)  Utilities: Not At Risk (07/14/2023)  Depression (PHQ2-9): Low Risk  (04/15/2023)  Tobacco Use: High Risk (07/14/2023)    Readmission Risk Interventions     No data to display

## 2023-07-18 NOTE — Progress Notes (Signed)
Patient has elevated BP and has had all day. Reports he will not take his oral meds for religious reasons. This is between him and God per patient. Patient explained risks of elevated BP such as stroke/death. He is aware of risks. Presently alert and oriented. Will give PRN IV meds, but still not quite as effective so far.  Night on call NP aware.Priscille Kluver

## 2023-07-18 NOTE — Plan of Care (Signed)

## 2023-07-19 MED ORDER — CLONIDINE HCL 0.2 MG/24HR TD PTWK
0.2000 mg | MEDICATED_PATCH | TRANSDERMAL | Status: DC
  Administered 2023-07-19: 0.2 mg via TRANSDERMAL
  Filled 2023-07-19: qty 1

## 2023-07-19 MED ORDER — LACTATED RINGERS IV SOLN: INTRAVENOUS | Status: AC

## 2023-07-19 NOTE — Telephone Encounter (Signed)
Patient has agreed to [facility] and MD Office is canceling Lonsurf. Called PAP and cancelled enrollment for patient.    Ardeen Fillers, CPhT Oncology Pharmacy Patient Advocate  [facility]  [phone] (phone) [phone] (fax) 07/19/2023 12:33 PM

## 2023-07-19 NOTE — Progress Notes (Signed)
IP PROGRESS NOTE  Subjective:   Mr. Peper drainage from his back.  He is trying to see the other end of the call button record. Objective: Vital signs in last 24 hours: Blood pressure (!) 151/117, pulse 86, temperature 98.1 F (36.7 C), temperature source Oral, resp. rate 16, height 5\' 7"  (1.702 m), weight 152 lb (68.9 kg), SpO2 (!) 74%.  Intake/Output from previous day: 09/15 0701 - 09/16 0700 In: 308.3 [I.V.:10; IV Piggyback:298.3] Out: 100 [Urine:100]  Physical Exam:  HEENT: No thrush Skin: Examination of the lower back and buttock appears unremarkable Abdomen: Firm mass occupying the mid abdomen, no hepatomegaly Extremities: Pitting edema at the legs bilaterally Neurologic: Alert, follows commands, appears confused   Portacath/PICC-without erythema  Lab Results: Recent Labs    07/17/23 0945  WBC 5.3  HGB 9.2*  HCT 30.8*  PLT 196    BMET Recent Labs    07/17/23 0945  NA 135  K 4.4  CL 103  CO2 25  GLUCOSE 71  BUN 13  CREATININE 0.75  CALCIUM 7.5*    Lab Results  Component Value Date   CEA1 2.0 08/04/2020   CEA 8.65 (H) 06/28/2023    Studies/Results: No results found.  Medications: I have reviewed the patient's current medications.  Assessment/Plan:  Colon cancer  CT abdomen/pelvis 08/02/2020-small amount of free fluid in the pelvis, postsurgical change involving the left colon, trace right pleural fluid.   Colonoscopy 08/04/2020-cecal mass as well as a 3 mm polyp in the ascending colon, 9 mm polyp in the ascending colon, 3 mm polyp in the transverse colon and 4 small polyps in the transverse colon.  Biopsy of the cecal mass showed adenocarcinoma.  Pathology on the polyps showed tubular adenomas with no high-grade dysplasia or malignancy identified. CT chest 08/04/2020-no evidence of metastatic disease CEA 08/04/2020-2.0 Laparoscopic right colectomy 10/17/2020 by Dr. Nigel Mormon showed adenocarcinoma with mucinous features, moderately  differentiated, 6.7 cm; no carcinoma identified and 23 lymph nodes; margins uninvolved; no macroscopic tumor perforation identified; tumor invaded through muscularis propria into pericolorectal tissue; no lymphovascular invasion and no perineural invasion, pT3, pN0, mismatch repair protein IHC normal, MSI stable.   06/18/2022-CT abdomen/pelvis-homogenous soft tissue mass in the anterior abdomen deep to the umbilicus, 5 mm left lower lobe nodule, new from October 2021 11/04/2022 biopsy of abdominal mass-metastatic mucinous adenocarcinoma, mismatch repair protein IHC normal, foundation 1 pending CTs 11/18/2022-large mass central lower abdomen is increased in size.  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  No evidence of obstruction.  Irregular lesions right lobe of the liver likely new, compatible with metastatic disease.  Increase size of a solid left lower lobe pulmonary nodule likely due to metastatic disease.  Solid pulmonary nodule left upper lobe increased in size likely due to indolent primary lung malignancy. Foundation 1 testing (date of collection 10/17/2020, specimen received 11/24/2022)-microsatellite status is equivocal; tumor mutation burden 6; K-ras wild-type; NRAS G12S; BRAF [id number] Cycle 1 FOLFIRI 12/07/2022 Chemotherapy held 12/21/2022 due to neutropenia Chemotherapy held 12/28/2022 per patient due to upcoming dental extractions Cycle 2 FOLFIRI 01/11/2023, irinotecan dose reduced secondary to diarrhea neutropenia, Udenyca Cycle 3 FOLFIRI 01/25/2023 Cycle 4 FOLFIRI 02/08/2023 Cycle 5 FOLFIRI 02/22/2023 CTs 02/24/2023-progression of liver metastases; similar to minimal enlargement of dominant anterior abdominopelvic wall mass with 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, new trace perihepatic ascites Cycle 1 FOLFOX 03/15/2023 Cycle 2 FOLFOX 04/06/2023 Cycle 3 FOLFOX 04/19/2023  Cycle 4 FOLFOX 05/05/2023,  oxaliplatin diluted in a larger volume and infusion time extended, premedications adjusted due to pruritus during cycle 3 Cycle 5 FOLFOX 06/02/2023 CTs 06/23/2023-pulmonary emboli, stable pulmonary nodules, new trace bilateral pleural effusions, mild enlargement of hepatic metastases and the anterior abdominal wall mass, fistulous communication of the mass and bowel Cycle 1 Lonsurf scheduled to start 07/14/2023 Iron deficiency anemia October 2021  08/02/2020 hemoglobin 3.8, MCV 56, ferritin 3, stool positive for occult blood 10/19/2020 hemoglobin 7.3, MCV 74 11/04/2022 hemoglobin 4.5 11/05/2022 ferritin 3 Ferrous sulfate 325 mg twice daily recommended 11/06/2022 2 units packed red blood cells 11/17/2022 stool cards positive for blood 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 He completed cycle 1 CAPOX beginning 11/13/2011; cycle 7 beginning 03/23/2012. Cycle 8 was completed beginning on 04/13/2012 without oxaliplatin. Restaging CT scans 06/06/2012 without evidence of recurrent disease.   Family history-brother with colon cancer Multiple polyps on colonoscopy 08/04/2020  Port-A-Cath placement 12/04/2022, Interventional Radiology 06/23/2023-right iliac/femoral DVT and pulmonary emboli Lovenox starting 06/23/2023 8.  Admission 07/12/2023 with urosepsis, blood and urine cultures positive for Klebsiella 9.  Altered mental status secondary to delirium from sepsis  Mr. Hayenga has metastatic colon cancer.  He was admitted with Klebsiella urosepsis.  He is overall status has declined following resolution of sepsis.  He has advanced metastatic colon cancer.  He is not a candidate for further chemotherapy.  I recommend hospice care.  Mr. Dupuy is confused and he is unable to care for himself.  His wife cannot manage his care at home.  He will need skilled nursing facility  placement.   Recommendations: Continue antibiotics per the medical service Continue Lovenox anticoagulation-could consider discontinue anticoagulation if he enrolls in hospice care and patient/family understand risk of thromboembolic disease Skilled nursing facility placement with hospice Please call oncology as needed, I will be glad to assist in his care as needed   LOS: 7 days   Thornton Papas, MD   07/19/2023, 1:22 PM

## 2023-07-19 NOTE — Telephone Encounter (Signed)
Per MD documentation on inpatient note, patient no longer a candidate for additional treatment. Oral chemo team is signing off.

## 2023-07-19 NOTE — Progress Notes (Signed)
PROGRESS NOTE    Aaron Roth  WUJ:[id number] DOB: April 06, 1946 DOA: 07/12/2023 PCP: Storm Frisk, MD  Subjective: Pt seen and examined. Wife at bedside. Pt continues to refuse medications. Awaiting SNF placement.  Discussed with wife that pt continues to refuse. He is at risk for recurrent DVT/PE. wife trying to convince patient to take lovenox but pt states he may try and have RN give it to him.  Discussed with wife that if continues to refuse that I will stop the order tomorrow.. she is agreeable to that.   Hospital Course: HPI: Aaron Roth is a 77 y.o. 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   Wife and son provides hx. Wife was asleep and heard pt call for help.Found to all the floor in the bathroom laying on his back and trying to get up. Pt does not really recall the fall but denies loss of consciousness. Son had trouble getting him out of bed so decided to come to ED. Denies any fever or chills.  No nausea, vomiting but has intermittent diarrhea since being on chemotherapy.  Has decreased appetite and has been losing weight.     In the ED, he had temperature of 100 F, blood pressure 130/95 on room air.   CBC without leukocytosis, hemoglobin 9.3 with baseline of 7-8.  Lactate within normal limits.   CMP notable for hypokalemia of 2.9, calcium 7.1 with albumin less than 1.5, creatinine stable at 0.90.  Magnesium within normal limits.   Troponin mildly elevated 34, BNP of 114.   UA was positive with large leukocyte, positive nitrite many bacteria.  Chest x-ray negative.  Positive FOBT likely secondary to his colon cancer as his hemoglobin is stable.   He also had hypoglycemia down to 13 although ED physician questions the validity of the test as patient was mentating well.  He was given D10 and had a repeat glucose that came up to  170s.  Significant Events: Admitted 07/12/2023 BG obtained for port-a-cath showed 135, despite fingerstick BG reading 26. 07-14-2023 pt started on Marinol for appetite stimulation Pt made DNR/DNI by palliative care on 07-15-2023  Significant Labs: Blood cx 07-12-2023 growing Klebsiella Urine cx 07-12-2023 growing Klebsiella pan-sensitive except for ampicillin  Significant Imaging Studies: 07-12-2023 CT head negative for CVA, shows small right parietal scalp hematoma  Antibiotic Therapy: Anti-infectives (From admission, onward)    Start     Dose/Rate Route Frequency Ordered Stop   07/17/23 0815  cefTRIAXone (ROCEPHIN) 2 g in sodium chloride 0.9 % 100 mL IVPB        2 g 200 mL/hr over 30 Minutes Intravenous Daily 07/17/23 0756     07/15/23 1000  cefadroxil (DURICEF) capsule 1,000 mg  Status:  Discontinued        1,000 mg Oral 2 times daily 07/15/23 0839 07/17/23 0756   07/13/23 1400  cefTRIAXone (ROCEPHIN) 2 g in sodium chloride 0.9 % 100 mL IVPB  Status:  Discontinued        2 g 200 mL/hr over 30 Minutes Intravenous Every 24 hours 07/13/23 1349 07/15/23 0839   07/12/23 2300  cefTRIAXone (ROCEPHIN) 1 g in sodium chloride 0.9 % 100 mL IVPB  Status:  Discontinued        1 g 200 mL/hr over 30 Minutes Intravenous Every 24 hours 07/12/23 2234 07/13/23 1349       Procedures:   Consultants:  Oncology - [place]    Assessment and Plan: * UTI due to Klebsiella species Urine cx growing Klebsiella pneumoniae.  Sensitive to Rocephin. IV rocephin started 07-12-2023. Stopped on 07-15-2023.  Started on [facility] 07-15-2023  Due to N/V and pt refusing to take po meds. IV Ancef started 07-17-2023 by pharmacy recommendations.  Septicemia due to Klebsiella pneumoniae (HCC) Started initially on IV rocephin. Blood MIC similar to urine cx. IV rocephin stopped on 07-15-2023.  discussed with pharmacy and decision made to start Duricef 1000 mg bid.  Due to pt refusal to take PO meds. IV Ancef started  07-17-2023.  ulture KLEBSIELLA PNEUMONIAE Abnormal   Report Status 07/15/2023 FINAL  Organism ID, Bacteria KLEBSIELLA PNEUMONIAE  Resulting Agency [facility]     Susceptibility    Klebsiella pneumoniae    MIC    AMPICILLIN RESISTANT Resistant    AMPICILLIN/SULBACTAM 4 SENSITIVE Sensitive    CEFEPIME <=0.12 SENS... Sensitive    CEFTAZIDIME <=1 SENSITIVE Sensitive    CEFTRIAXONE <=0.25 SENS... Sensitive    CIPROFLOXACIN <=0.25 SENS... Sensitive    GENTAMICIN <=1 SENSITIVE Sensitive    IMIPENEM <=0.25 SENS... Sensitive    PIP/TAZO <=4 SENSITIVE Sensitive    TRIMETH/SULFA <=20 SENSIT... Sensitive               Weakness Secondary to UTI and dehydration Continue with PT  History of DVT (deep vein thrombosis) Continue with therapeutic dose lovenox. If pt continues to refuse lovenox, will plan on stopping it tomorrow(07-20-2023). Wife is aware of risk of recurrent PE/DVT without systemic anticoagulation  History of pulmonary embolus (PE) 06/23/2023-right iliac/femoral DVT and pulmonary emboli  continue therapeutic Lovenox  If pt continues to refuse lovenox, will plan on stopping it tomorrow(07-20-2023). Wife is aware of risk of recurrent PE/DVT without systemic anticoagulation  Hypoglycemia Resolved. BG from port-a-cath was normal despite CBG from finger saying BG was low. Stopped accuchecks.  Hypokalemia Resolved after IV and oral potassium replacement.  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 oncology has recommended against starting salvage therapy with Lonsurf. Pt performance status is poor. Pt with overall very poor prognosis. Oncology has discussed prognosis with pt's wife. She understands that pt is not candidate for chemo any longer.  07-17-2023 I explained to wife that pt is dying from his progressive cancer. Nothing can stop that. She seems to understand it but then asks if his appetite  will improve. I explained to her that pt's appetite will likely not improve as his cancer is progressive.  07-19-2023 pt continues to refuse to eat/drink/take his meds. Wife still wanting SNF for rehab.  Essential hypertension Pt refusing amlodipine and Coreg. Continue with prn labetalol 10 mg IV q4h prn SBP >170 or DBP >100 since pt continues to refuse medications. 07-19-2023 Add clonidine patch for BP control.  Iron deficiency anemia due to chronic blood loss - Stable.  Hemoglobin of 8.6 with baseline around 7-8  DNR (do not resuscitate)/DNI(Do Not Intubate) GOC discussion held by palliative care 07-15-2023. I really appreciate their involvement.  Pt's code status changed to DNR/DNI.   DVT prophylaxis:   Lovenox   Code Status: Limited: Do not attempt resuscitation (DNR) -DNR-LIMITED -Do Not Intubate/DNI  Family Communication: discussed with pt and wife at bedside Disposition Plan: SNF Reason for continuing need for hospitalization: awaiting SNF placement. Medically stable.  Objective: Vitals:   07/18/23 2340 07/19/23 0555 07/19/23 0745 07/19/23 1340  BP:  (!) 146/112 (!) 151/117 Marland Kitchen)  153/101  Pulse: 76 89 86 76  Resp: 16     Temp: 97.7 F (36.5 C) 98.1 F (36.7 C)  (!) 97.5 F (36.4 C)  TempSrc: Oral Oral  Oral  SpO2: 100% (!) 74%  (!) 83%  Weight:      Height:        Intake/Output Summary (Last 24 hours) at 07/19/2023 1707 Last data filed at 07/19/2023 1100 Gross per 24 hour  Intake 130 ml  Output 100 ml  Net 30 ml   Filed Weights   07/12/23 1722  Weight: 68.9 kg    Examination:  Physical Exam Vitals and nursing note reviewed.  Constitutional:      General: He is not in acute distress.    Appearance: He is not toxic-appearing.     Comments: Thin, frail, AAM no distress  HENT:     Head: Normocephalic and atraumatic.  Cardiovascular:     Rate and Rhythm: Normal rate and regular rhythm.  Pulmonary:     Effort: Pulmonary effort is normal. No respiratory  distress.  Abdominal:     General: There is no distension.     Comments: Multiple hard masses in his abd. Mostly in lower abd area.  Musculoskeletal:     Right lower leg: 1+ Edema present.     Left lower leg: 1+ Edema present.     Right ankle: Swelling present.     Left ankle: Swelling present.     Right foot: Swelling present.     Left foot: Swelling present.  Skin:    General: Skin is warm and dry.     Capillary Refill: Capillary refill takes less than 2 seconds.  Neurological:     Comments: Confused.     Data Reviewed: I have personally reviewed following labs and imaging studies  CBC: Recent Labs  Lab 07/12/23 1815 07/13/23 0443 07/14/23 0459 07/17/23 0945  WBC 6.0 6.2 5.4 5.3  NEUTROABS 4.3  --  3.4  --   HGB 9.3* 9.1* 8.6* 9.2*  HCT 31.3* 30.0* 28.4* 30.8*  MCV 79.6* 79.2* 79.8* 81.5  PLT 212 200 164 196   Basic Metabolic Panel: Recent Labs  Lab 07/12/23 1815 07/13/23 0443 07/14/23 0459 07/17/23 0945  NA 137 137 136 135  K 2.9* 3.6 3.9 4.4  CL 102 104 104 103  CO2 27 27 27 25   GLUCOSE 72 85 105* 71  BUN 21 19 19 13   CREATININE 0.90 0.69 0.86 0.75  CALCIUM 7.1* 7.1* 7.1* 7.5*  MG 2.1  --   --  2.1   GFR: Estimated Creatinine Clearance: 70 mL/min (by C-G formula based on SCr of 0.75 mg/dL). Liver Function Tests: Recent Labs  Lab 07/12/23 1815 07/17/23 0945  AST 17 16  ALT 13 13  ALKPHOS 97 86  BILITOT 0.7 0.4  PROT 4.7* 5.0*  ALBUMIN <1.5* 1.6*   Coagulation Profile: Recent Labs  Lab 07/12/23 1815  INR 1.2   CBG: Recent Labs  Lab 07/14/23 0621 07/14/23 0734 07/14/23 1159 07/14/23 1205 07/14/23 1218  GLUCAP 62* 80 24* 27* 135*   Sepsis Labs: Recent Labs  Lab 07/12/23 1844 07/12/23 1945  LATICACIDVEN 1.0 1.2    Recent Results (from the past 240 hour(s))  Urine Culture     Status: Abnormal   Collection Time: 07/12/23  6:15 PM   Specimen: Urine, Random  Result Value Ref Range Status   Specimen Description   Final     URINE, RANDOM Performed at [facility], [address], [place], [place] [address]    Special Requests   Final    NONE Reflexed from [id number] Performed at [facility], [address] [place], [place] [address]    Culture >=100,000 COLONIES/mL KLEBSIELLA PNEUMONIAE (A)  Final   Report Status 07/14/2023 FINAL  Final   Organism ID, Bacteria KLEBSIELLA PNEUMONIAE (A)  Final      Susceptibility   Klebsiella pneumoniae - MIC*    AMPICILLIN RESISTANT Resistant     CEFAZOLIN <=4 SENSITIVE Sensitive     CEFEPIME <=0.12 SENSITIVE Sensitive     CEFTRIAXONE <=0.25 SENSITIVE Sensitive     CIPROFLOXACIN <=0.25 SENSITIVE Sensitive     GENTAMICIN <=1 SENSITIVE Sensitive     IMIPENEM <=0.25 SENSITIVE Sensitive     NITROFURANTOIN 32 SENSITIVE Sensitive     TRIMETH/SULFA <=20 SENSITIVE Sensitive     AMPICILLIN/SULBACTAM 4 SENSITIVE Sensitive     PIP/TAZO <=4 SENSITIVE Sensitive     * >=100,000 COLONIES/mL KLEBSIELLA PNEUMONIAE  Blood Culture (routine x 2)     Status: Abnormal   Collection Time: 07/12/23  8:25 PM   Specimen: BLOOD RIGHT HAND  Result Value Ref Range Status   Specimen Description   Final    BLOOD RIGHT HAND Performed at [facility], [address] [place], [place] [address]    Special Requests   Final    BOTTLES DRAWN AEROBIC AND ANAEROBIC Blood Culture adequate volume Performed at [facility], [address], [place], [place] [address]    Culture  Setup Time   Final    GRAM NEGATIVE RODS AEROBIC BOTTLE ONLY CRITICAL RESULT CALLED TO, READ BACK BY AND VERIFIED WITH: PHARMD J LEGGE [id number] AT 1332 BY CM Performed at [facility], [address] [place], [place] [address]    Culture KLEBSIELLA PNEUMONIAE (A)  Final   Report Status 07/15/2023 FINAL  Final   Organism ID, Bacteria KLEBSIELLA PNEUMONIAE  Final      Susceptibility   Klebsiella pneumoniae - MIC*    AMPICILLIN RESISTANT Resistant      CEFEPIME <=0.12 SENSITIVE Sensitive     CEFTAZIDIME <=1 SENSITIVE Sensitive     CEFTRIAXONE <=0.25 SENSITIVE Sensitive     CIPROFLOXACIN <=0.25 SENSITIVE Sensitive     GENTAMICIN <=1 SENSITIVE Sensitive     IMIPENEM <=0.25 SENSITIVE Sensitive     TRIMETH/SULFA <=20 SENSITIVE Sensitive     AMPICILLIN/SULBACTAM 4 SENSITIVE Sensitive     PIP/TAZO <=4 SENSITIVE Sensitive     * KLEBSIELLA PNEUMONIAE  Blood Culture ID Panel (Reflexed)     Status: Abnormal   Collection Time: 07/12/23  8:25 PM  Result Value Ref Range Status   Enterococcus faecalis NOT DETECTED NOT DETECTED Final   Enterococcus Faecium NOT DETECTED NOT DETECTED Final   Listeria monocytogenes NOT DETECTED NOT DETECTED Final   Staphylococcus species NOT DETECTED NOT DETECTED Final   Staphylococcus aureus (BCID) NOT DETECTED NOT DETECTED Final   Staphylococcus epidermidis NOT DETECTED NOT DETECTED Final   Staphylococcus lugdunensis NOT DETECTED NOT DETECTED Final   Streptococcus species NOT DETECTED NOT DETECTED Final   Streptococcus agalactiae NOT DETECTED NOT DETECTED Final   Streptococcus pneumoniae NOT DETECTED NOT DETECTED Final   Streptococcus pyogenes NOT DETECTED NOT DETECTED Final   A.calcoaceticus-baumannii NOT DETECTED NOT DETECTED Final   Bacteroides fragilis NOT DETECTED NOT DETECTED Final   Enterobacterales DETECTED (A) NOT DETECTED Final    Comment: Enterobacterales represent a large order of gram  negative bacteria, not a single organism. CRITICAL RESULT CALLED TO, READ BACK BY AND VERIFIED WITH: PHARMD J LEGGE [id number] AT 1332 BY CM    Enterobacter cloacae complex NOT DETECTED NOT DETECTED Final   Escherichia coli NOT DETECTED NOT DETECTED Final   Klebsiella aerogenes NOT DETECTED NOT DETECTED Final   Klebsiella oxytoca NOT DETECTED NOT DETECTED Final   Klebsiella pneumoniae DETECTED (A) NOT DETECTED Final    Comment: CRITICAL RESULT CALLED TO, READ BACK BY AND VERIFIED WITH: PHARMD J LEGGE [id number] AT 1332  BY CM    Proteus species NOT DETECTED NOT DETECTED Final   Salmonella species NOT DETECTED NOT DETECTED Final   Serratia marcescens NOT DETECTED NOT DETECTED Final   Haemophilus influenzae NOT DETECTED NOT DETECTED Final   Neisseria meningitidis NOT DETECTED NOT DETECTED Final   Pseudomonas aeruginosa NOT DETECTED NOT DETECTED Final   Stenotrophomonas maltophilia NOT DETECTED NOT DETECTED Final   Candida albicans NOT DETECTED NOT DETECTED Final   Candida auris NOT DETECTED NOT DETECTED Final   Candida glabrata NOT DETECTED NOT DETECTED Final   Candida krusei NOT DETECTED NOT DETECTED Final   Candida parapsilosis NOT DETECTED NOT DETECTED Final   Candida tropicalis NOT DETECTED NOT DETECTED Final   Cryptococcus neoformans/gattii NOT DETECTED NOT DETECTED Final   CTX-M ESBL NOT DETECTED NOT DETECTED Final   Carbapenem resistance IMP NOT DETECTED NOT DETECTED Final   Carbapenem resistance KPC NOT DETECTED NOT DETECTED Final   Carbapenem resistance NDM NOT DETECTED NOT DETECTED Final   Carbapenem resist OXA 48 LIKE NOT DETECTED NOT DETECTED Final   Carbapenem resistance VIM NOT DETECTED NOT DETECTED Final    Comment: Performed at [facility], [address], [place], [place] [address]     Radiology Studies: No results found.  Scheduled Meds:  Chlorhexidine Gluconate Cloth  6 each Topical Daily   cloNIDine  0.2 mg Transdermal Weekly   dronabinol  2.5 mg Oral QAC lunch   enoxaparin (LOVENOX) injection  100 mg Subcutaneous Q24H   feeding supplement  237 mL Oral BID BM   sodium chloride flush  10-40 mL Intracatheter Q12H   Continuous Infusions:   ceFAZolin (ANCEF) IV 2 g (07/19/23 1000)   lactated ringers       LOS: 7 days   Time spent: 35 minutes  Carollee Herter, DO  [facility]  07/19/2023, 5:07 PM

## 2023-07-19 NOTE — Progress Notes (Signed)
Physical Therapy Treatment Patient Details Name: Aaron Roth MRN: [id number] DOB: 20-May-1946 Today's Date: 07/19/2023   History of Present Illness 77 yo male presents to therapy following hospital admission secondary to fall and progressive weakness over the past 3-4 days. Pt was found to have elevated tempeture and BP. Pt was found to have UTI and started on ABX. Pt PMH includes but is not limited to: colon ca and currently on chemo, 06/23/2023 R LE DVT and PE, HTN, GERD, anemia, and HTN.    PT Comments    Pt admitted with above diagnosis.  Pt currently with functional limitations due to the deficits listed below (see PT Problem List). Pt in bed when PT arrived. Noted increased confusion and difficulty with word finding. Pt required increased time for all motor processing and planing. Pt reported several times that there is something coming out of him and it is clear. PT did not note weeping or drainage at time of tx session. PT noted significant increased R LE edema--proximal and distal. Pt required increased time and mod A x 2 for supine to sit, max A x 2 for SPT bed to recliner. Pt required A to reposition in recliner. Pt left seated in recliner and all needs in place and wife entered room at end of tx session. PT continues to recommend inpatient follow up therapy, <3 hours/day at time of d/c. Pt demonstrated minimal progress toward PT and personal goals and able to engage with therapeutic activity today.  Pt will benefit from acute skilled PT to increase their independence and safety with mobility to allow discharge.      If plan is discharge home, recommend the following: Assistance with cooking/housework;Assist for transportation;Help with stairs or ramp for entrance;Supervision due to cognitive status;Two people to help with walking and/or transfers;Two people to help with bathing/dressing/bathroom;Direct supervision/assist for medications management;Direct supervision/assist for financial  management   Can travel by private vehicle        Equipment Recommendations  Rolling walker (2 wheels)    Recommendations for Other Services       Precautions / Restrictions Precautions Precautions: Fall Restrictions Weight Bearing Restrictions: No     Mobility  Bed Mobility Overal bed mobility: Needs Assistance Bed Mobility: Supine to Sit     Supine to sit: Mod assist, HOB elevated, Used rails, +2 for physical assistance     General bed mobility comments: pt requires increased time, specific cues for sequencing and encouragement, HOB elevated and use of bed rails.    Transfers Overall transfer level: Needs assistance Equipment used: None Transfers: Bed to chair/wheelchair/BSC Sit to Stand: From elevated surface, Max assist Stand pivot transfers: Max assist, +2 physical assistance, +2 safety/equipment, From elevated surface         General transfer comment: pt demonstrated strong posterior lean and pushing response wtih trunk flexion, narrow BOS pt required max A for static standing balance at RW, max A x 2 for sit to stand and max A x 2 to complete SPT to recliner, facilitation for weigth shifing and cues for LE placement as well as a for RW management with absent eccentric control to recliner, max A once in recliner to reposition. Nurse techn assisted PT with transfer and bed mobility tasks and is aware of need for A x 2 for SPT to return to bed    Ambulation/Gait               General Gait Details: NT due to extensive assist   Stairs  Wheelchair Mobility     Tilt Bed    Modified Rankin (Stroke Patients Only)       Balance Overall balance assessment: Needs assistance, History of Falls Sitting-balance support: Feet supported Sitting balance-Leahy Scale: Poor     Standing balance support: During functional activity, Reliant on assistive device for balance, Bilateral upper extremity supported Standing balance-Leahy Scale: Zero                               Cognition Arousal: Alert Behavior During Therapy: Agitated, Impulsive Overall Cognitive Status: Impaired/Different from baseline Area of Impairment: Attention, Memory, Safety/judgement, Following commands                               General Comments: pt presents with increased confusion today pt exhibited difficulty with word finding and communication        Exercises      General Comments General comments (skin integrity, edema, etc.): significant R LE --distal and proximal edema, slight L distal LE edema, R UE proximal edema and pocketing L UE edema      Pertinent Vitals/Pain Pain Assessment Pain Assessment: Faces Faces Pain Scale: Hurts little more Pain Location: R knee Pain Descriptors / Indicators: Aching, Constant, Discomfort Pain Intervention(s): Limited activity within patient's tolerance, Monitored during session, Repositioned    Home Living Family/patient expects to be discharged to:: Private residence Living Arrangements: Spouse/significant other Available Help at Discharge: Family Type of Home: House Home Access: Stairs to enter Entrance Stairs-Rails: Right Entrance Stairs-Number of Steps: 2 (1 step to enter from the front of the home with posts to hold onto)   Home Layout: One level        Prior Function            PT Goals (current goals can now be found in the care plan section) Acute Rehab PT Goals PT Goal Formulation: Patient unable to participate in goal setting Progress towards PT goals: Progressing toward goals    Frequency    Min 1X/week      PT Plan      Co-evaluation              AM-PAC PT "6 Clicks" Mobility   Outcome Measure  Help needed turning from your back to your side while in a flat bed without using bedrails?: A Lot Help needed moving from lying on your back to sitting on the side of a flat bed without using bedrails?: A Lot Help needed moving to and from a  bed to a chair (including a wheelchair)?: Total Help needed standing up from a chair using your arms (e.g., wheelchair or bedside chair)?: Total Help needed to walk in hospital room?: Total Help needed climbing 3-5 steps with a railing? : Total 6 Click Score: 8    End of Session Equipment Utilized During Treatment: Gait belt Activity Tolerance: Patient limited by fatigue Patient left: in chair;with call bell/phone within reach;with family/visitor present Nurse Communication: Mobility status PT Visit Diagnosis: Unsteadiness on feet (R26.81);Other abnormalities of gait and mobility (R26.89);Muscle weakness (generalized) (M62.81);History of falling (Z91.81);Difficulty in walking, not elsewhere classified (R26.2);Pain Pain - Right/Left: Right Pain - part of body: Knee;Leg     Time: 8841-6606 PT Time Calculation (min) (ACUTE ONLY): 28 min  Charges:    $Therapeutic Activity: 23-37 mins PT General Charges $$ ACUTE PT VISIT: 1 Visit  Johnny [place], PT Acute Rehab    Jacqualyn Posey 07/19/2023, 1:25 PM

## 2023-07-19 NOTE — TOC Progression Note (Signed)
Transition of Care [facility]) - Progression Note    Patient Details  Name: Aaron Roth MRN: [id number] Date of Birth: 10/17/1946  Transition of Care [facility]) CM/SW Contact  Beckie Busing, RN Phone [phone]  07/19/2023, 12:16 PM  Clinical Narrative:    Currently no bed offers. Bed search extended to more [place] facilities.      Barriers to Discharge: Continued Medical Work up  Expected Discharge Plan and Services                                               Social Determinants of Health (SDOH) Interventions SDOH Screenings   Food Insecurity: Food Insecurity Present (07/14/2023)  Housing: Medium Risk (07/14/2023)  Transportation Needs: No Transportation Needs (07/14/2023)  Utilities: Not At Risk (07/14/2023)  Depression (PHQ2-9): Low Risk  (04/15/2023)  Tobacco Use: High Risk (07/14/2023)    Readmission Risk Interventions     No data to display

## 2023-07-20 MED ORDER — CLONIDINE 0.3 MG/24HR TD PTWK: 0.3000 mg | MEDICATED_PATCH | TRANSDERMAL | Status: DC

## 2023-07-20 MED ORDER — ONDANSETRON HCL 4 MG PO TABS: 4.0000 mg | ORAL_TABLET | Freq: Four times a day (QID) | ORAL | Status: DC | PRN

## 2023-07-20 MED ORDER — HEPARIN SOD (PORK) LOCK FLUSH 100 UNIT/ML IV SOLN
500.0000 [IU] | Freq: Once | INTRAVENOUS | Status: AC
  Administered 2023-07-20: 500 [IU] via INTRAVENOUS
  Filled 2023-07-20: qty 5

## 2023-07-20 NOTE — Plan of Care (Addendum)
Report given to Nurse Lowella Bandy at [facility]. Port deaccessed, awaiting PTAR. 1600: PTAR arrived, patient left in stable condition. Son & wife notified and will meet patient there.  Problem: Education: Goal: Knowledge of General Education information will improve Description: Including pain rating scale, medication(s)/side effects and non-pharmacologic comfort measures Outcome: Adequate for Discharge   Problem: Health Behavior/Discharge Planning: Goal: Ability to manage health-related needs will improve Outcome: Adequate for Discharge   Problem: Clinical Measurements: Goal: Ability to maintain clinical measurements within normal limits will improve Outcome: Adequate for Discharge Goal: Will remain free from infection Outcome: Adequate for Discharge Goal: Diagnostic test results will improve Outcome: Adequate for Discharge Goal: Respiratory complications will improve Outcome: Adequate for Discharge   Problem: Activity: Goal: Risk for activity intolerance will decrease Outcome: Adequate for Discharge   Problem: Nutrition: Goal: Adequate nutrition will be maintained Outcome: Adequate for Discharge   Problem: Coping: Goal: Level of anxiety will decrease Outcome: Adequate for Discharge   Problem: Elimination: Goal: Will not experience complications related to bowel motility Outcome: Adequate for Discharge Goal: Will not experience complications related to urinary retention Outcome: Adequate for Discharge   Problem: Pain Managment: Goal: General experience of comfort will improve Outcome: Adequate for Discharge   Problem: Safety: Goal: Ability to remain free from injury will improve Outcome: Adequate for Discharge   Problem: Skin Integrity: Goal: Risk for impaired skin integrity will decrease Outcome: Adequate for Discharge

## 2023-07-20 NOTE — TOC Progression Note (Addendum)
Transition of Care [facility]) - Progression Note    Patient Details  Name: Aaron Roth MRN: [id number] Date of Birth: [birth year]  Transition of Care [facility]) CM/SW Contact  Beckie Busing, RN Phone Number:[phone]  07/20/2023, 2:08 PM  Clinical Narrative:    Patient has received bed offers. [facility] an offer. This is the 1st choice of the patient and wife. CM spoke with [facility] admissions coordinator at [facility]. Per Star she will check patients insurance info to make sure he still has medicare days. Facility can accept patient. D/c summary will need to be sent to facility by 3:30 in order for facility to accept patient today. MD has been updated. CM attempted to call wife to update her but there is no answer. Voicemail has been left.  1433 CM spoke with wife. Per wife she is ok with discharge to [place]. MD updated.       Barriers to Discharge: Continued Medical Work up  Expected Discharge Plan and Services                                               Social Determinants of Health (SDOH) Interventions SDOH Screenings   Food Insecurity: Food Insecurity Present (07/14/2023)  Housing: Medium Risk (07/14/2023)  Transportation Needs: No Transportation Needs (07/14/2023)  Utilities: Not At Risk (07/14/2023)  Depression (PHQ2-9): Low Risk  (04/15/2023)  Tobacco Use: High Risk (07/14/2023)    Readmission Risk Interventions     No data to display

## 2023-07-20 NOTE — Discharge Summary (Addendum)
[facility] Phys[facility] Discharge Summary   Patient name: Aaron Roth  Admit date:     07/12/2023  Discharge date: 07/20/2023  Attending Physician: Anselm Jungling [[id number]]  Discharge Physician: Carollee Herter   PCP: Storm Frisk, MD  Admitted From: Home  Disposition:   [facility]  Recommendations for Outpatient Follow-up:  Follow up with PCP in 1-2 weeks Outpatient referral to hospice  Home Health:No Equipment/Devices: None    Discharge Condition:Guarded CODE STATUS:DNR/DNI Diet recommendation: Heart Healthy Fluid Restriction: None  Hospital Summary: HPI: Aaron Roth is a 77 y.o. 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   Wife and son provides hx. Wife was asleep and heard pt call for help.Found to all the floor in the bathroom laying on his back and trying to get up. Pt does not really recall the fall but denies loss of consciousness. Son had trouble getting him out of bed so decided to come to ED. Denies any fever or chills.  No nausea, vomiting but has intermittent diarrhea since being on chemotherapy.  Has decreased appetite and has been losing weight.     In the ED, he had temperature of 100 F, blood pressure 130/95 on room air.   CBC without leukocytosis, hemoglobin 9.3 with baseline of 7-8.  Lactate within normal limits.   CMP notable for hypokalemia of 2.9, calcium 7.1 with albumin less than 1.5, creatinine stable at 0.90.  Magnesium within normal limits.   Troponin mildly elevated 34, BNP of 114.   UA was positive with large leukocyte, positive nitrite many bacteria.  Chest x-ray negative.  Positive FOBT likely secondary to his colon cancer as his hemoglobin is stable.   He also had hypoglycemia down to 13 although ED physician questions the validity of the test as patient was mentating well.  He was given D10 and  had a repeat glucose that came up to 170s.  Significant Events: Admitted 07/12/2023 BG obtained for port-a-cath showed 135, despite fingerstick BG reading 26. 07-14-2023 pt started on Marinol for appetite stimulation Pt made DNR/DNI by palliative care on 07-15-2023  Significant Labs: Blood cx 07-12-2023 growing Klebsiella Urine cx 07-12-2023 growing Klebsiella pan-sensitive except for ampicillin  Significant Imaging Studies: 07-12-2023 CT head negative for CVA, shows small right parietal scalp hematoma  Antibiotic Therapy: Anti-infectives (From admission, onward)    Start     Dose/Rate Route Frequency Ordered Stop   07/17/23 1800  ceFAZolin (ANCEF) IVPB 2g/100 mL premix        2 g 200 mL/hr over 30 Minutes Intravenous Every 8 hours 07/17/23 1422     07/17/23 0815  cefTRIAXone (ROCEPHIN) 2 g in sodium chloride 0.9 % 100 mL IVPB  Status:  Discontinued        2 g 200 mL/hr over 30 Minutes Intravenous Daily 07/17/23 0756 07/17/23 1422   07/15/23 1000  cefadroxil (DURICEF) capsule 1,000 mg  Status:  Discontinued        1,000 mg Oral 2 times daily 07/15/23 0839 07/17/23 0756   07/13/23 1400  cefTRIAXone (ROCEPHIN) 2 g in sodium chloride 0.9 % 100 mL IVPB  Status:  Discontinued        2 g 200 mL/hr over 30 Minutes Intravenous Every 24 hours 07/13/23 1349 07/15/23 0839   07/12/23 2300  cefTRIAXone (ROCEPHIN) 1 g in sodium chloride 0.9 % 100 mL IVPB  Status:  Discontinued  1 g 200 mL/hr over 30 Minutes Intravenous Every 24 hours 07/12/23 2234 07/13/23 1349       Procedures:   Consultants: Oncology - [facility] Course by Problem: * UTI due to Klebsiella species Urine cx growing Klebsiella pneumoniae.  Sensitive to Rocephin. IV rocephin started 07-12-2023. Stopped on 07-15-2023.  Started on [facility] 07-15-2023  Due to N/V and pt refusing to take po meds. IV Ancef started 07-17-2023 by pharmacy recommendations.  Pt completed 7 days of IV/PO abx for uti.  Treatment completed.  Septicemia due to Klebsiella pneumoniae (HCC). Pt did NOT have sepsis. Started initially on IV rocephin. Blood MIC similar to urine cx. IV rocephin stopped on 07-15-2023.  discussed with pharmacy and decision made to start Duricef 1000 mg bid.  Due to pt refusal to take PO meds. IV Ancef started 07-17-2023.  Pt completed 7 days of IV/PO abx for septicemia. Treatment completed.  ulture KLEBSIELLA PNEUMONIAE Abnormal   Report Status 07/15/2023 FINAL  Organism ID, Bacteria KLEBSIELLA PNEUMONIAE  Resulting Agency [facility]     Susceptibility    Klebsiella pneumoniae    MIC    AMPICILLIN RESISTANT Resistant    AMPICILLIN/SULBACTAM 4 SENSITIVE Sensitive    CEFEPIME <=0.12 SENS... Sensitive    CEFTAZIDIME <=1 SENSITIVE Sensitive    CEFTRIAXONE <=0.25 SENS... Sensitive    CIPROFLOXACIN <=0.25 SENS... Sensitive    GENTAMICIN <=1 SENSITIVE Sensitive    IMIPENEM <=0.25 SENS... Sensitive    PIP/TAZO <=4 SENSITIVE Sensitive    TRIMETH/SULFA <=20 SENSIT... Sensitive               Weakness Secondary to UTI and dehydration Continue with PT  History of DVT (deep vein thrombosis) Continue with therapeutic dose lovenox. Pt has been refusing lovenox.   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  Will continue lovenox at SNF but if pt continues to refuse, this may have to be stopped. Wife is aware of risk of VTE due to pt's continued refusal of systemic anticoagulation.  Hypoglycemia Resolved. BG from port-a-cath was normal despite CBG from finger saying BG was low. Stopped accuchecks.  Hypokalemia Resolved after IV and oral potassium replacement.  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 oncology has recommended against starting salvage therapy with Lonsurf. Pt performance status is poor. Pt with overall very poor prognosis. Oncology has discussed prognosis with pt's wife. She understands that pt is not candidate for chemo any longer.  07-17-2023 I explained to wife that pt is dying from his progressive cancer. Nothing can stop that. She seems to understand it but then asks if his appetite will improve. I explained to her that pt's appetite will likely not improve as his cancer is progressive.  07-19-2023 pt continues to refuse to eat/drink/take his meds. Wife still wanting SNF for rehab. 07-20-2023 SNF bed offer to [facility].  CM working on English as a second language teacher. If able to get SNF authorized, pt will dc to SNF today.  Essential hypertension Pt refusing amlodipine and Coreg. Pt placed on prn labetalol 10 mg IV q4h prn SBP >170 or DBP >100 since pt continues to refuse medications. 07-19-2023 Add clonidine patch for BP control.  Will DC to SNF on clonidine patch 0.3 mg/24h changed qweekly.  Iron deficiency anemia due  to chronic blood loss - Stable.  Hemoglobin of 8.6 with baseline around 7-8  DNR (do not resuscitate)/DNI(Do Not Intubate) GOC discussion held by palliative care 07-15-2023. I really appreciate their involvement.  Pt's code status changed to DNR/DNI.    Discharge Diagnoses:  Principal Problem:   UTI due to Klebsiella species Active Problems:   Septicemia due to Klebsiella pneumoniae (HCC)   Iron deficiency anemia due to chronic blood loss   Essential hypertension   Colon cancer, ascending (HCC)   Hypocalcemia   Hypokalemia   Hypoglycemia   History of pulmonary embolus (PE)   History of DVT (deep vein thrombosis)   Weakness   DNR (do not resuscitate)/DNI(Do Not Intubate)   Discharge Instructions  Discharge Instructions     Call MD for:  extreme fatigue   Complete by: As directed    Call MD for:  persistant dizziness or  light-headedness   Complete by: As directed    Call MD for:  persistant nausea and vomiting   Complete by: As directed    Call MD for:  severe uncontrolled pain   Complete by: As directed    Call MD for:  temperature >100.4   Complete by: As directed    Diet - low sodium heart healthy   Complete by: As directed    Discharge instructions   Complete by: As directed    1. Outpatient hospice referral   Increase activity slowly   Complete by: As directed       Allergies as of 07/20/2023       Reactions   Hydralazine Itching   Other    Pre-breaded shrimp   Oxaliplatin Itching   Patient had hypersensitivity reaction to Oxaliplatin. See progress note from 04/19/2023 at 1830. Patient did not complete infusion.        Medication List     STOP taking these medications    amLODipine 10 MG tablet Commonly known as: NORVASC   carvedilol 25 MG tablet Commonly known as: COREG   FeroSul 325 (65 Fe) MG tablet Generic drug: ferrous sulfate   Lonsurf 15-6.14 MG tablet Generic drug: trifluridine-tipiracil   potassium chloride 10 MEQ tablet Commonly known as: KLOR-CON M   predniSONE 10 MG tablet Commonly known as: DELTASONE   prochlorperazine 10 MG tablet Commonly known as: COMPAZINE       TAKE these medications    cloNIDine 0.3 mg/24hr patch Commonly known as: CATAPRES - Dosed in mg/24 hr Place 1 patch (0.3 mg total) onto the skin once a week. Start taking on: July 26, 2023   enoxaparin 100 MG/ML injection Commonly known as: LOVENOX Inject 0.9 mLs (90 mg total) into the skin daily.   Immune Enhance Tabs Take 1 tablet by mouth daily.   ondansetron 4 MG tablet Commonly known as: Zofran Take 1 tablet (4 mg total) by mouth every 6 (six) hours as needed for nausea or vomiting.   VITAMIN C PO Take 1 tablet by mouth daily.        Allergies  Allergen Reactions   Hydralazine Itching   Other     Pre-breaded shrimp   Oxaliplatin Itching    Patient had  hypersensitivity reaction to Oxaliplatin. See progress note from 04/19/2023 at 1830. Patient did not complete infusion.    Discharge Exam: Vitals:   07/20/23 0458 07/20/23 1330  BP: (!) 140/105 (!) 143/115  Pulse: 92 90  Resp:  12  Temp: 97.7 F (36.5 C) (!) 97.4 F (36.3 C)  SpO2: 100%  100%    Physical Exam Vitals and nursing note reviewed.  Constitutional:      General: He is not in acute distress.    Appearance: He is not toxic-appearing.     Comments: Thin, frail, AAM no distress  HENT:     Head: Normocephalic and atraumatic.  Cardiovascular:     Rate and Rhythm: Normal rate and regular rhythm.  Pulmonary:     Effort: Pulmonary effort is normal. No respiratory distress.  Abdominal:     General: There is no distension.     Comments: Multiple hard masses in his abd. Mostly in lower abd area.  Musculoskeletal:     Right lower leg: 1+ Edema present.     Left lower leg: 1+ Edema present.     Right ankle: Swelling present.     Left ankle: Swelling present.     Right foot: Swelling present.     Left foot: Swelling present.  Skin:    General: Skin is warm and dry.     Capillary Refill: Capillary refill takes less than 2 seconds.  Neurological:     Comments: Confused.     The results of significant diagnostics from this hospitalization (including imaging, microbiology, ancillary and laboratory) are listed below for reference.    Microbiology: Recent Results (from the past 240 hour(s))  Urine Culture     Status: Abnormal   Collection Time: 07/12/23  6:15 PM   Specimen: Urine, Random  Result Value Ref Range Status   Specimen Description   Final    URINE, RANDOM Performed at [facility], [address] [place], [place] [address]    Special Requests   Final    NONE Reflexed from [id number] Performed at [facility], [address], [place], [place] [address]    Culture >=100,000 COLONIES/mL KLEBSIELLA PNEUMONIAE (A)  Final    Report Status 07/14/2023 FINAL  Final   Organism ID, Bacteria KLEBSIELLA PNEUMONIAE (A)  Final      Susceptibility   Klebsiella pneumoniae - MIC*    AMPICILLIN RESISTANT Resistant     CEFAZOLIN <=4 SENSITIVE Sensitive     CEFEPIME <=0.12 SENSITIVE Sensitive     CEFTRIAXONE <=0.25 SENSITIVE Sensitive     CIPROFLOXACIN <=0.25 SENSITIVE Sensitive     GENTAMICIN <=1 SENSITIVE Sensitive     IMIPENEM <=0.25 SENSITIVE Sensitive     NITROFURANTOIN 32 SENSITIVE Sensitive     TRIMETH/SULFA <=20 SENSITIVE Sensitive     AMPICILLIN/SULBACTAM 4 SENSITIVE Sensitive     PIP/TAZO <=4 SENSITIVE Sensitive     * >=100,000 COLONIES/mL KLEBSIELLA PNEUMONIAE  Blood Culture (routine x 2)     Status: Abnormal   Collection Time: 07/12/23  8:25 PM   Specimen: BLOOD RIGHT HAND  Result Value Ref Range Status   Specimen Description   Final    BLOOD RIGHT HAND Performed at [facility], [address], [place], [place] [address]    Special Requests   Final    BOTTLES DRAWN AEROBIC AND ANAEROBIC Blood Culture adequate volume Performed at [facility], [address] [place], [place] [address]    Culture  Setup Time   Final    GRAM NEGATIVE RODS AEROBIC BOTTLE ONLY CRITICAL RESULT CALLED TO, READ BACK BY AND VERIFIED WITH: PHARMD J LEGGE [id number] AT 1332 BY CM Performed at [facility], [address] [place], [place] [address]    Culture KLEBSIELLA PNEUMONIAE (A)  Final   Report Status  07/15/2023 FINAL  Final   Organism ID, Bacteria KLEBSIELLA PNEUMONIAE  Final      Susceptibility   Klebsiella pneumoniae - MIC*    AMPICILLIN RESISTANT Resistant     CEFEPIME <=0.12 SENSITIVE Sensitive     CEFTAZIDIME <=1 SENSITIVE Sensitive     CEFTRIAXONE <=0.25 SENSITIVE Sensitive     CIPROFLOXACIN <=0.25 SENSITIVE Sensitive     GENTAMICIN <=1 SENSITIVE Sensitive     IMIPENEM <=0.25 SENSITIVE Sensitive     TRIMETH/SULFA <=20 SENSITIVE Sensitive     AMPICILLIN/SULBACTAM 4  SENSITIVE Sensitive     PIP/TAZO <=4 SENSITIVE Sensitive     * KLEBSIELLA PNEUMONIAE  Blood Culture ID Panel (Reflexed)     Status: Abnormal   Collection Time: 07/12/23  8:25 PM  Result Value Ref Range Status   Enterococcus faecalis NOT DETECTED NOT DETECTED Final   Enterococcus Faecium NOT DETECTED NOT DETECTED Final   Listeria monocytogenes NOT DETECTED NOT DETECTED Final   Staphylococcus species NOT DETECTED NOT DETECTED Final   Staphylococcus aureus (BCID) NOT DETECTED NOT DETECTED Final   Staphylococcus epidermidis NOT DETECTED NOT DETECTED Final   Staphylococcus lugdunensis NOT DETECTED NOT DETECTED Final   Streptococcus species NOT DETECTED NOT DETECTED Final   Streptococcus agalactiae NOT DETECTED NOT DETECTED Final   Streptococcus pneumoniae NOT DETECTED NOT DETECTED Final   Streptococcus pyogenes NOT DETECTED NOT DETECTED Final   A.calcoaceticus-baumannii NOT DETECTED NOT DETECTED Final   Bacteroides fragilis NOT DETECTED NOT DETECTED Final   Enterobacterales DETECTED (A) NOT DETECTED Final    Comment: Enterobacterales represent a large order of gram negative bacteria, not a single organism. CRITICAL RESULT CALLED TO, READ BACK BY AND VERIFIED WITH: PHARMD J LEGGE [id number] AT 1332 BY CM    Enterobacter cloacae complex NOT DETECTED NOT DETECTED Final   Escherichia coli NOT DETECTED NOT DETECTED Final   Klebsiella aerogenes NOT DETECTED NOT DETECTED Final   Klebsiella oxytoca NOT DETECTED NOT DETECTED Final   Klebsiella pneumoniae DETECTED (A) NOT DETECTED Final    Comment: CRITICAL RESULT CALLED TO, READ BACK BY AND VERIFIED WITH: PHARMD J LEGGE [id number] AT 1332 BY CM    Proteus species NOT DETECTED NOT DETECTED Final   Salmonella species NOT DETECTED NOT DETECTED Final   Serratia marcescens NOT DETECTED NOT DETECTED Final   Haemophilus influenzae NOT DETECTED NOT DETECTED Final   Neisseria meningitidis NOT DETECTED NOT DETECTED Final   Pseudomonas aeruginosa NOT DETECTED  NOT DETECTED Final   Stenotrophomonas maltophilia NOT DETECTED NOT DETECTED Final   Candida albicans NOT DETECTED NOT DETECTED Final   Candida auris NOT DETECTED NOT DETECTED Final   Candida glabrata NOT DETECTED NOT DETECTED Final   Candida krusei NOT DETECTED NOT DETECTED Final   Candida parapsilosis NOT DETECTED NOT DETECTED Final   Candida tropicalis NOT DETECTED NOT DETECTED Final   Cryptococcus neoformans/gattii NOT DETECTED NOT DETECTED Final   CTX-M ESBL NOT DETECTED NOT DETECTED Final   Carbapenem resistance IMP NOT DETECTED NOT DETECTED Final   Carbapenem resistance KPC NOT DETECTED NOT DETECTED Final   Carbapenem resistance NDM NOT DETECTED NOT DETECTED Final   Carbapenem resist OXA 48 LIKE NOT DETECTED NOT DETECTED Final   Carbapenem resistance VIM NOT DETECTED NOT DETECTED Final    Comment: Performed at [facility], [address] [place], [place] [address]     Labs: BNP (last 3 results) Recent Labs    07/12/23 1815  BNP 114.0*   Basic Metabolic Panel: Recent Labs  Lab  07/14/23 0459 07/17/23 0945  NA 136 135  K 3.9 4.4  CL 104 103  CO2 27 25  GLUCOSE 105* 71  BUN 19 13  CREATININE 0.86 0.75  CALCIUM 7.1* 7.5*  MG  --  2.1   Liver Function Tests: Recent Labs  Lab 07/17/23 0945  AST 16  ALT 13  ALKPHOS 86  BILITOT 0.4  PROT 5.0*  ALBUMIN 1.6*   CBC: Recent Labs  Lab 07/14/23 0459 07/17/23 0945  WBC 5.4 5.3  NEUTROABS 3.4  --   HGB 8.6* 9.2*  HCT 28.4* 30.8*  MCV 79.8* 81.5  PLT 164 196   CBG: Recent Labs  Lab 07/14/23 0621 07/14/23 0734 07/14/23 1159 07/14/23 1205 07/14/23 1218  GLUCAP 62* 80 24* 27* 135*   Urinalysis    Component Value Date/Time   COLORURINE YELLOW 07/12/2023 1815   APPEARANCEUR HAZY (A) 07/12/2023 1815   LABSPEC 1.021 07/12/2023 1815   PHURINE 5.0 07/12/2023 1815   GLUCOSEU NEGATIVE 07/12/2023 1815   HGBUR SMALL (A) 07/12/2023 1815   BILIRUBINUR NEGATIVE 07/12/2023 1815   KETONESUR NEGATIVE  07/12/2023 1815   PROTEINUR 30 (A) 07/12/2023 1815   NITRITE POSITIVE (A) 07/12/2023 1815   LEUKOCYTESUR LARGE (A) 07/12/2023 1815   Sepsis Labs Recent Labs  Lab 07/14/23 0459 07/17/23 0945  WBC 5.4 5.3   Microbiology Recent Results (from the past 240 hour(s))  Urine Culture     Status: Abnormal   Collection Time: 07/12/23  6:15 PM   Specimen: Urine, Random  Result Value Ref Range Status   Specimen Description   Final    URINE, RANDOM Performed at [facility], [address], [place], [place] [address]    Special Requests   Final    NONE Reflexed from [id number] Performed at [facility], [address], [place], [place] [address]    Culture >=100,000 COLONIES/mL KLEBSIELLA PNEUMONIAE (A)  Final   Report Status 07/14/2023 FINAL  Final   Organism ID, Bacteria KLEBSIELLA PNEUMONIAE (A)  Final      Susceptibility   Klebsiella pneumoniae - MIC*    AMPICILLIN RESISTANT Resistant     CEFAZOLIN <=4 SENSITIVE Sensitive     CEFEPIME <=0.12 SENSITIVE Sensitive     CEFTRIAXONE <=0.25 SENSITIVE Sensitive     CIPROFLOXACIN <=0.25 SENSITIVE Sensitive     GENTAMICIN <=1 SENSITIVE Sensitive     IMIPENEM <=0.25 SENSITIVE Sensitive     NITROFURANTOIN 32 SENSITIVE Sensitive     TRIMETH/SULFA <=20 SENSITIVE Sensitive     AMPICILLIN/SULBACTAM 4 SENSITIVE Sensitive     PIP/TAZO <=4 SENSITIVE Sensitive     * >=100,000 COLONIES/mL KLEBSIELLA PNEUMONIAE  Blood Culture (routine x 2)     Status: Abnormal   Collection Time: 07/12/23  8:25 PM   Specimen: BLOOD RIGHT HAND  Result Value Ref Range Status   Specimen Description   Final    BLOOD RIGHT HAND Performed at [facility], [address], [place], [place] [address]    Special Requests   Final    BOTTLES DRAWN AEROBIC AND ANAEROBIC Blood Culture adequate volume Performed at [facility], [address], [place], [place] [address]    Culture  Setup Time   Final     GRAM NEGATIVE RODS AEROBIC BOTTLE ONLY CRITICAL RESULT CALLED TO, READ BACK BY AND VERIFIED WITH: PHARMD J LEGGE [id number] AT 1332 BY CM Performed at [facility], [address], [place], [place] [address]  Culture KLEBSIELLA PNEUMONIAE (A)  Final   Report Status 07/15/2023 FINAL  Final   Organism ID, Bacteria KLEBSIELLA PNEUMONIAE  Final      Susceptibility   Klebsiella pneumoniae - MIC*    AMPICILLIN RESISTANT Resistant     CEFEPIME <=0.12 SENSITIVE Sensitive     CEFTAZIDIME <=1 SENSITIVE Sensitive     CEFTRIAXONE <=0.25 SENSITIVE Sensitive     CIPROFLOXACIN <=0.25 SENSITIVE Sensitive     GENTAMICIN <=1 SENSITIVE Sensitive     IMIPENEM <=0.25 SENSITIVE Sensitive     TRIMETH/SULFA <=20 SENSITIVE Sensitive     AMPICILLIN/SULBACTAM 4 SENSITIVE Sensitive     PIP/TAZO <=4 SENSITIVE Sensitive     * KLEBSIELLA PNEUMONIAE  Blood Culture ID Panel (Reflexed)     Status: Abnormal   Collection Time: 07/12/23  8:25 PM  Result Value Ref Range Status   Enterococcus faecalis NOT DETECTED NOT DETECTED Final   Enterococcus Faecium NOT DETECTED NOT DETECTED Final   Listeria monocytogenes NOT DETECTED NOT DETECTED Final   Staphylococcus species NOT DETECTED NOT DETECTED Final   Staphylococcus aureus (BCID) NOT DETECTED NOT DETECTED Final   Staphylococcus epidermidis NOT DETECTED NOT DETECTED Final   Staphylococcus lugdunensis NOT DETECTED NOT DETECTED Final   Streptococcus species NOT DETECTED NOT DETECTED Final   Streptococcus agalactiae NOT DETECTED NOT DETECTED Final   Streptococcus pneumoniae NOT DETECTED NOT DETECTED Final   Streptococcus pyogenes NOT DETECTED NOT DETECTED Final   A.calcoaceticus-baumannii NOT DETECTED NOT DETECTED Final   Bacteroides fragilis NOT DETECTED NOT DETECTED Final   Enterobacterales DETECTED (A) NOT DETECTED Final    Comment: Enterobacterales represent a large order of gram negative bacteria, not a single organism. CRITICAL RESULT CALLED TO, READ BACK BY  AND VERIFIED WITH: PHARMD J LEGGE [id number] AT 1332 BY CM    Enterobacter cloacae complex NOT DETECTED NOT DETECTED Final   Escherichia coli NOT DETECTED NOT DETECTED Final   Klebsiella aerogenes NOT DETECTED NOT DETECTED Final   Klebsiella oxytoca NOT DETECTED NOT DETECTED Final   Klebsiella pneumoniae DETECTED (A) NOT DETECTED Final    Comment: CRITICAL RESULT CALLED TO, READ BACK BY AND VERIFIED WITH: PHARMD J LEGGE [id number] AT 1332 BY CM    Proteus species NOT DETECTED NOT DETECTED Final   Salmonella species NOT DETECTED NOT DETECTED Final   Serratia marcescens NOT DETECTED NOT DETECTED Final   Haemophilus influenzae NOT DETECTED NOT DETECTED Final   Neisseria meningitidis NOT DETECTED NOT DETECTED Final   Pseudomonas aeruginosa NOT DETECTED NOT DETECTED Final   Stenotrophomonas maltophilia NOT DETECTED NOT DETECTED Final   Candida albicans NOT DETECTED NOT DETECTED Final   Candida auris NOT DETECTED NOT DETECTED Final   Candida glabrata NOT DETECTED NOT DETECTED Final   Candida krusei NOT DETECTED NOT DETECTED Final   Candida parapsilosis NOT DETECTED NOT DETECTED Final   Candida tropicalis NOT DETECTED NOT DETECTED Final   Cryptococcus neoformans/gattii NOT DETECTED NOT DETECTED Final   CTX-M ESBL NOT DETECTED NOT DETECTED Final   Carbapenem resistance IMP NOT DETECTED NOT DETECTED Final   Carbapenem resistance KPC NOT DETECTED NOT DETECTED Final   Carbapenem resistance NDM NOT DETECTED NOT DETECTED Final   Carbapenem resist OXA 48 LIKE NOT DETECTED NOT DETECTED Final   Carbapenem resistance VIM NOT DETECTED NOT DETECTED Final    Comment: Performed at [facility], [address], [place], [place] [address]    Procedures/Studies: DG Abd 1 View  Result Date: 07/16/2023 CLINICAL DATA:  Vomiting.  Metastatic colon  cancer. EXAM: ABDOMEN - 1 VIEW COMPARISON:  CT abdomen/pelvis dated 06/23/2023. FINDINGS: Nonspecific but nonobstructive bowel gas pattern. Visualized osseous  structures are within normal limits. IMPRESSION: Negative. Electronically Signed   By: Charline Bills M.D.   On: 07/16/2023 19:35   DG Chest Port 1 View  Result Date: 07/12/2023 CLINICAL DATA:  Questionable sepsis EXAM: PORTABLE CHEST 1 VIEW COMPARISON:  Chest x-ray 08/02/2020 FINDINGS: Right chest port catheter tip ends in the SVC. Cardiomediastinal silhouette is stable. The lungs are clear. There is no pleural effusion or pneumothorax. No acute fractures are seen. IMPRESSION: No active disease. Electronically Signed   By: Darliss Cheney M.D.   On: 07/12/2023 20:26   CT Head Wo Contrast  Result Date: 07/12/2023 CLINICAL DATA:  Altered mental status and fall EXAM: CT HEAD WITHOUT CONTRAST TECHNIQUE: Contiguous axial images were obtained from the base of the skull through the vertex without intravenous contrast. RADIATION DOSE REDUCTION: This exam was performed according to the departmental dose-optimization program which includes automated exposure control, adjustment of the mA and/or kV according to patient size and/or use of iterative reconstruction technique. COMPARISON:  None Available. FINDINGS: Brain: There is no mass, hemorrhage or extra-axial collection. The appearance of the white matter is normal for the patient's age. There is moderate generalized atrophy. Vascular: No abnormal hyperdensity of the major intracranial arteries or dural venous sinuses. No intracranial atherosclerosis. Skull: Small right parietal scalp hematoma.  No skull fracture. Sinuses/Orbits: No fluid levels or advanced mucosal thickening of the visualized paranasal sinuses. No mastoid or middle ear effusion. The orbits are normal. IMPRESSION: 1. No acute intracranial abnormality. 2. Small right parietal scalp hematoma without skull fracture. Electronically Signed   By: Deatra Robinson M.D.   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 Resection. * Tracking Code: BO * EXAM: CT CHEST, ABDOMEN, AND PELVIS WITH CONTRAST TECHNIQUE: Multidetector CT imaging of the chest, abdomen and pelvis was performed following the standard protocol during bolus administration of intravenous contrast. RADIATION DOSE REDUCTION: This exam was performed according to the departmental dose-optimization program which includes automated exposure control, adjustment of the mA and/or kV according to patient size and/or use of iterative reconstruction technique. CONTRAST:  80mL OMNIPAQUE IOHEXOL 300 MG/ML  SOLN COMPARISON:  02/24/2023 FINDINGS: CT CHEST FINDINGS Cardiovascular: Right Port-A-Cath tip superior caval/atrial junction. Aortic atherosclerosis. Tortuous thoracic aorta. Normal heart size with minimal anterior pericardial fluid. Small volume but definite lobar and segmental pulmonary emboli to the right lower lobe including on images 41-43 of series 2. Mediastinum/Nodes: No supraclavicular adenopathy. No mediastinal or hilar adenopathy. Lungs/Pleura: Trace bilateral pleural effusions, new. Mild to moderate centrilobular and paraseptal emphysema. Mild biapical pleuroparenchymal scarring. Left apical index nodule measures 5 mm on 35/4 versus 7 mm on the prior. A lingular nodule measures 5 mm on 105/4 and is not significantly changed. Medial left lower lobe pleural-based nodule measures 9 x 8 mm on 123/4 versus 9 x 7 mm on the prior. More medial left upper lobe nodule measures 7 mm on 40/4 versus 5 mm previously. Musculoskeletal: No acute osseous abnormality. CT ABDOMEN PELVIS FINDINGS Hepatobiliary: Large volume right-sided hepatic metastasis. High right hepatic lobe mass measures 3.6 x 3.3 cm on 55/2 versus 3.6 x 3.1 cm on the prior. Lower density today, suggesting necrosis. More caudal, dominant posterior right hepatic lobe mass measures 6.4 x 7.7 cm on 64/2  versus 6.0 x 5.8 cm on the prior. Immediately caudal to this,  a mass measures 6.2 x 4.9 cm on 73/2 versus 5.6 x 5.3 cm previously. Normal gallbladder, without biliary ductal dilatation. Pancreas: Normal, without mass or ductal dilatation. Spleen: Normal in size, without focal abnormality. Adrenals/Urinary Tract: Normal adrenal glands. Normal kidneys, without hydronephrosis. Normal urinary bladder. Stomach/Bowel: Normal stomach, without wall thickening. Probable partial right hemicolectomy. No bowel obstruction. The anterior abdominopelvic mass with fistulous communication to small bowel is again identified. Estimated at 14.7 x 9.4 cm on 83/2 versus 13.7 x 8.9 cm on the prior exam. Vascular/Lymphatic: Aortic atherosclerosis. Filling defect within the right common iliac vein on 82/2. Right femoral and external iliac vein nearly occlusive thrombus including on 106/2. No abdominal adenopathy. Upper normal right external iliac node including at 9 mm on 96/2, similar. Reproductive: Normal prostate. Other: Decreased trace pelvic fluid. No free intraperitoneal air. Anasarca. Musculoskeletal: Lumbosacral spondylosis. IMPRESSION: CT CHEST IMPRESSION 1. Right lower lobe lobar and segmental pulmonary emboli. 2. Similar pulmonary nodules. Some measure slightly larger and some slightly smaller. 3. No thoracic adenopathy. 4. New trace bilateral pleural effusions. 5. Aortic atherosclerosis (ICD10-I70.0) and emphysema (ICD10-J43.9). CT ABDOMEN AND PELVIS IMPRESSION 1. 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 2. Mild enlargement of dominant anterior abdominopelvic wall mass with fistulous communication to bowel. 3. Right-sided deep venous thrombosis including within the femoral, external iliac, and common iliac veins. 4. Decrease in trace pelvic fluid. 5. No bowel obstruction or other acute complication. These results will be called to the ordering clinician or representative by the Radiologist Assistant, and communication documented in the PACS or [employer]. Electronically  Signed   By: Jeronimo Greaves M.D.   On: 06/23/2023 14:16    Time coordinating discharge: 40 mins  SIGNED:  Carollee Herter, DO [facility] 07/20/23, 2:37 PM

## 2023-07-20 NOTE — TOC Transition Note (Signed)
Transition of Care [facility]) - CM/SW Discharge Note   Patient Details  Name: Aaron Roth MRN: [id number] Date of Birth: 1946-07-10  Transition of Care [facility] CM/SW Contact:  Beckie Busing, RN Phone Number:[phone]  07/20/2023, 3:11 PM   Clinical Narrative:    Patient discharging to [place]. Wife is aware. Transportation has been arranged per [facility]. Discharge packet is at nurses station.  Please call report to  [facility]  Rm# [id number] [phone].    Final next level of care: Skilled Nursing Facility Barriers to Discharge: No Barriers Identified   Patient Goals and CMS Choice      Discharge Placement                Patient chooses bed at: [facility] Patient to be transferred to facility by: [facility] Name of family member notified: Sankalp Guhl wife Patient and family notified of of transfer: 07/20/23  Discharge Plan and Services Additional resources added to the After Visit Summary for                  DME Arranged: N/A DME Agency: NA       HH Arranged: NA HH Agency: NA        Social Determinants of Health (SDOH) Interventions SDOH Screenings   Food Insecurity: Food Insecurity Present (07/14/2023)  Housing: Medium Risk (07/14/2023)  Transportation Needs: No Transportation Needs (07/14/2023)  Utilities: Not At Risk (07/14/2023)  Depression (PHQ2-9): Low Risk  (04/15/2023)  Tobacco Use: High Risk (07/14/2023)     Readmission Risk Interventions     No data to display

## 2023-07-21 ENCOUNTER — Ambulatory Visit: Payer: Self-pay | Admitting: Critical Care Medicine

## 2023-07-24 ENCOUNTER — Other Ambulatory Visit: Payer: Self-pay

## 2023-07-28 ENCOUNTER — Inpatient Hospital Stay: Payer: Self-pay

## 2023-07-28 ENCOUNTER — Inpatient Hospital Stay: Payer: Self-pay | Admitting: Nurse Practitioner

## 2023-08-03 DEATH — deceased

## 2023-08-04 ENCOUNTER — Telehealth: Payer: Self-pay

## 2023-08-04 NOTE — Telephone Encounter (Signed)
The spouse of the patient called to inform us that the patient passed away on 08/03/2023, with family members at the bedside. She expressed her gratitude to Dr. Truett Perna and his team for the compassionate care provided throughout the patient's journey.

## 2023-08-18 ENCOUNTER — Inpatient Hospital Stay: Payer: Self-pay | Admitting: Oncology

## 2023-09-22 NOTE — Telephone Encounter (Signed)
Telephone call
